# Patient Record
Sex: Male | Born: 1940 | Race: White | Hispanic: No | Marital: Married | Smoking: Former smoker
Health system: Southern US, Community
[De-identification: ages and names within clinical notes are randomized; demographics above are authoritative.]

## PROBLEM LIST (undated history)

## (undated) DIAGNOSIS — K219 Gastro-esophageal reflux disease without esophagitis: Secondary | ICD-10-CM

## (undated) DIAGNOSIS — N200 Calculus of kidney: Secondary | ICD-10-CM

## (undated) DIAGNOSIS — I10 Essential (primary) hypertension: Secondary | ICD-10-CM

## (undated) DIAGNOSIS — Q159 Congenital malformation of eye, unspecified: Secondary | ICD-10-CM

## (undated) DIAGNOSIS — C140 Malignant neoplasm of pharynx, unspecified: Secondary | ICD-10-CM

## (undated) DIAGNOSIS — C61 Malignant neoplasm of prostate: Secondary | ICD-10-CM

## (undated) HISTORY — DX: Malignant neoplasm of prostate: C61

## (undated) HISTORY — PX: CAROTID STENT: SHX1301

## (undated) HISTORY — PX: TONSILLECTOMY: SUR1361

## (undated) HISTORY — PX: EYE SURGERY: SHX253

---

## 2011-12-03 ENCOUNTER — Encounter (HOSPITAL_BASED_OUTPATIENT_CLINIC_OR_DEPARTMENT_OTHER): Payer: Self-pay | Admitting: *Deleted

## 2011-12-03 ENCOUNTER — Emergency Department (HOSPITAL_BASED_OUTPATIENT_CLINIC_OR_DEPARTMENT_OTHER)
Admission: EM | Admit: 2011-12-03 | Discharge: 2011-12-03 | Disposition: A | Discharge status: Home / Self Care | Payer: Medicare Other | Attending: Emergency Medicine | Admitting: Emergency Medicine

## 2011-12-03 DIAGNOSIS — R109 Unspecified abdominal pain: Secondary | ICD-10-CM | POA: Insufficient documentation

## 2011-12-03 DIAGNOSIS — R1110 Vomiting, unspecified: Secondary | ICD-10-CM

## 2011-12-03 DIAGNOSIS — E876 Hypokalemia: Secondary | ICD-10-CM | POA: Insufficient documentation

## 2011-12-03 DIAGNOSIS — R5381 Other malaise: Secondary | ICD-10-CM | POA: Insufficient documentation

## 2011-12-03 DIAGNOSIS — R10817 Generalized abdominal tenderness: Secondary | ICD-10-CM | POA: Insufficient documentation

## 2011-12-03 HISTORY — DX: Essential (primary) hypertension: I10

## 2011-12-03 HISTORY — DX: Malignant neoplasm of pharynx, unspecified: C14.0

## 2011-12-03 LAB — COMPREHENSIVE METABOLIC PANEL
Albumin: 3.1 g/dL — ABNORMAL LOW (ref 3.5–5.2)
Alkaline Phosphatase: 80 U/L (ref 39–117)
BUN: 8 mg/dL (ref 6–23)
Calcium: 9.1 mg/dL (ref 8.4–10.5)
GFR calc Af Amer: 87 mL/min — ABNORMAL LOW (ref >90)
Glucose, Bld: 115 mg/dL — ABNORMAL HIGH (ref 70–99)
Potassium: 2.9 mEq/L — ABNORMAL LOW (ref 3.5–5.1)
Total Protein: 6 g/dL (ref 6.0–8.3)

## 2011-12-03 LAB — URINE MICROSCOPIC-ADD ON

## 2011-12-03 LAB — DIFFERENTIAL
Basophils Absolute: 0 10*3/uL (ref 0.0–0.1)
Eosinophils Absolute: 0 10*3/uL (ref 0.0–0.7)
Lymphocytes Relative: 52 % — ABNORMAL HIGH (ref 12–46)
Lymphs Abs: 0.9 10*3/uL (ref 0.7–4.0)
Monocytes Relative: 12 % (ref 3–12)

## 2011-12-03 LAB — CBC
MCHC: 35.7 g/dL (ref 30.0–36.0)
Platelets: 85 10*3/uL — ABNORMAL LOW (ref 150–400)
RDW: 14.2 % (ref 11.5–15.5)
WBC: 1.6 10*3/uL — ABNORMAL LOW (ref 4.0–10.5)

## 2011-12-03 LAB — URINALYSIS, ROUTINE W REFLEX MICROSCOPIC
Hgb urine dipstick: NEGATIVE
Nitrite: NEGATIVE
Protein, ur: 30 mg/dL — AB
Specific Gravity, Urine: 1.019 (ref 1.005–1.030)
Urobilinogen, UA: 1 mg/dL (ref 0.0–1.0)

## 2011-12-03 LAB — LIPASE, BLOOD: Lipase: 34 U/L (ref 11–59)

## 2011-12-03 MED ORDER — ONDANSETRON HCL 4 MG/2ML IJ SOLN
4.0000 mg | Freq: Once | INTRAMUSCULAR | Status: AC
  Administered 2011-12-03: 4 mg via INTRAVENOUS
  Filled 2011-12-03: qty 2

## 2011-12-03 MED ORDER — POTASSIUM CHLORIDE 10 MEQ/100ML IV SOLN
10.0000 meq | Freq: Once | INTRAVENOUS | Status: AC
  Administered 2011-12-03: 10 meq via INTRAVENOUS
  Filled 2011-12-03: qty 100

## 2011-12-03 MED ORDER — FAMOTIDINE IN NACL 20-0.9 MG/50ML-% IV SOLN
20.0000 mg | Freq: Once | INTRAVENOUS | Status: AC
  Administered 2011-12-03: 20 mg via INTRAVENOUS
  Filled 2011-12-03: qty 50

## 2011-12-03 MED ORDER — POTASSIUM CHLORIDE ER 10 MEQ PO TBCR: 20.0000 meq | EXTENDED_RELEASE_TABLET | Freq: Every day | ORAL | Status: DC

## 2011-12-03 MED ORDER — SODIUM CHLORIDE 0.9 % IV BOLUS (SEPSIS)
2000.0000 mL | Freq: Once | INTRAVENOUS | Status: AC
  Administered 2011-12-03: 2000 mL via INTRAVENOUS

## 2011-12-03 NOTE — ED Provider Notes (Signed)
History     CSN: [id number]  Arrival date & time 12/03/11  0845   First MD Initiated Contact with Patient 12/03/11 [phone]      Chief Complaint  Patient presents with  . Abdominal Pain  . Emesis    (Consider location/radiation/quality/duration/timing/severity/associated sxs/prior treatment) Patient is a 71 y.o. male presenting with abdominal pain and vomiting. The history is provided by the patient and the spouse.  Abdominal Pain The primary symptoms of the illness include abdominal pain, fatigue, nausea and vomiting. The primary symptoms of the illness do not include fever, shortness of breath, diarrhea or dysuria. The current episode started more than 2 days ago. The onset of the illness was gradual. The problem has not changed since onset. The illness is associated with eating. The patient has not had a change in bowel habit. Additional symptoms associated with the illness include anorexia and diaphoresis. Symptoms associated with the illness do not include chills, heartburn, constipation, urgency, hematuria, frequency or back pain. Significant associated medical issues do not include PUD, GERD, inflammatory bowel disease, diabetes, sickle cell disease, gallstones, liver disease, substance abuse, diverticulitis or HIV.  Emesis  Associated symptoms include abdominal pain. Pertinent negatives include no chills, no cough, no diarrhea, no fever and no headaches.   71 year old male coming in today with complaint of dehydration. States that he has had 35 radiation treatments that ended last Tuesday and ischemic treatments and at the third week in April for soft acute cancer. States that he vomits on a daily basis vomited 3 times yesterday. Also states he has general abdominal pain that he has had throughout the treatments no new pain. He has a PCP Gen. Margo Aye that he went to Saturday and received 1 L. States that he doesn't think he received enough fluid that he still feels dehydrated. He goes to the  oncologist as to Dr. Marquis Lunch. States that he has not eaten in several days because of his nausea and discomfort. States that anytime he takes his medication or takes his pills he vomits it back up. Past medical history of hypertension. She never smokes.  Past Medical History  Diagnosis Date  . Throat cancer     No past surgical history on file.  No family history on file.  History  Substance Use Topics  . Smoking status: Not on file  . Smokeless tobacco: Not on file  . Alcohol Use:       Review of Systems  Constitutional: Positive for diaphoresis and fatigue. Negative for fever and chills.  HENT: Negative.  Negative for congestion, sore throat, rhinorrhea, mouth sores, trouble swallowing, neck pain and voice change.   Eyes: Negative.   Respiratory: Negative.  Negative for cough and shortness of breath.   Cardiovascular: Negative.  Negative for chest pain.  Gastrointestinal: Positive for nausea, vomiting, abdominal pain, blood in stool and anorexia. Negative for heartburn, diarrhea, constipation and rectal pain.  Genitourinary: Negative for dysuria, urgency, frequency, hematuria and difficulty urinating.  Musculoskeletal: Negative for back pain and gait problem.  Skin: Negative.   Neurological: Negative.  Negative for dizziness, tremors, syncope, numbness and headaches.  Psychiatric/Behavioral: Negative.   All other systems reviewed and are negative.    Allergies  Review of patient's allergies indicates no known allergies.  Home Medications   Current Outpatient Rx  Name Route Sig Dispense Refill  . AMLODIPINE BESYLATE 10 MG PO TABS Oral Take 10 mg by mouth daily.    . ASPIRIN 81 MG PO TABS Oral Take 81 mg  by mouth daily.    Marland Kitchen DEXAMETHASONE 4 MG PO TABS Oral Take 4 mg by mouth 2 (two) times daily with a meal.    . FAMOTIDINE 20 MG PO TABS Oral Take 20 mg by mouth 2 (two) times daily.    Marland Kitchen FLUCONAZOLE 100 MG PO TABS Oral Take 100 mg by mouth daily.    Marland Kitchen FLUTICASONE PROPIONATE  50 MCG/ACT NA SUSP Nasal Place 2 sprays into the nose daily as needed.    Marland Kitchen LORAZEPAM 1 MG PO TABS Oral Take 1 mg by mouth every 6 (six) hours as needed.    Marland Kitchen LOSARTAN POTASSIUM 100 MG PO TABS Oral Take 100 mg by mouth daily.    Marland Kitchen MIRTAZAPINE 15 MG PO TABS Oral Take 15 mg by mouth at bedtime.    . OMEPRAZOLE 40 MG PO CPDR Oral Take 40 mg by mouth daily.    Marland Kitchen ONDANSETRON 8 MG PO TBDP Oral Take 8 mg by mouth every 8 (eight) hours as needed.    Marland Kitchen POLYETHYLENE GLYCOL 3350 PO PACK Oral Take 17 g by mouth daily.    Marland Kitchen PROCHLORPERAZINE MALEATE 10 MG PO TABS Oral Take 10 mg by mouth every 6 (six) hours as needed.    . SENNOSIDES 8.8 MG/5ML PO SYRP Oral Take by mouth at bedtime.      BP 133/85  Pulse 86  Temp(Src) 97.9 F (36.6 C) (Oral)  Resp 20  Ht 6\' 4"  (1.93 m)  Wt 220 lb (99.791 kg)  BMI 26.78 kg/m2  SpO2 99%  Physical Exam  Nursing note and vitals reviewed. Constitutional: He is oriented to person, place, and time. He appears well-developed and well-nourished.  HENT:  Head: Normocephalic.  Eyes: Conjunctivae and EOM are normal. Pupils are equal, round, and reactive to light.  Neck: Normal range of motion. Neck supple.  Cardiovascular: Normal rate.   Pulmonary/Chest: Effort normal and breath sounds normal. No respiratory distress.  Abdominal: Soft. Bowel sounds are normal. He exhibits no distension. There is tenderness.       Generalized tenderness with palpatation  Musculoskeletal: Normal range of motion. He exhibits no edema and no tenderness.  Neurological: He is alert and oriented to person, place, and time.  Skin: Skin is warm and dry. He is not diaphoretic.  Psychiatric: He has a normal mood and affect.    ED Course  Procedures (including critical care time)  Labs Reviewed - No data to display No results found.   No diagnosis found.    MDM  No further vomiting or diarrhea in the ER today.  Tolerating po's.  Will follow up with pcp in [place].  Labs  unremarkable.  rx for zofran .  Continue prilosec at home.  Urine culture pending .  No symptoms of uti.  Labs Reviewed  CBC - Abnormal; Notable for the following:    WBC 1.6 (*) WHITE COUNT CONFIRMED ON SMEAR   RBC 4.13 (*)    Hemoglobin 12.6 (*)    HCT 35.3 (*)    Platelets 85 (*)    All other components within normal limits  DIFFERENTIAL - Abnormal; Notable for the following:    Neutrophils Relative 34 (*)    Lymphocytes Relative 52 (*)    Neutro Abs 0.5 (*)    All other components within normal limits  URINALYSIS, ROUTINE W REFLEX MICROSCOPIC - Abnormal; Notable for the following:    Color, Urine AMBER (*) BIOCHEMICALS MAY BE AFFECTED BY COLOR   Bilirubin Urine SMALL (*)  Ketones, ur 15 (*)    Protein, ur 30 (*)    All other components within normal limits  COMPREHENSIVE METABOLIC PANEL - Abnormal; Notable for the following:    Potassium 2.9 (*)    Glucose, Bld 115 (*)    Albumin 3.1 (*)    GFR calc non Af Amer 75 (*)    GFR calc Af Amer 87 (*)    All other components within normal limits  URINE MICROSCOPIC-ADD ON - Abnormal; Notable for the following:    Bacteria, UA MANY (*)    Casts HYALINE CASTS (*) GRANULAR CAST   All other components within normal limits  LIPASE, BLOOD  URINE CULTURE          Remi Haggard, NP 12/03/11 1945

## 2011-12-03 NOTE — Discharge Instructions (Signed)
Mr Bumbaugh we gave you 2 L of normal saline in the ER today. We also gave you some Zofran in your IV. Your  potassium was 2.9 and we replaced 10 mEq in your IV. We will give you a prescription for 2 days to replenish your potassium. Take this medication after you had eaten. Bananas and green repeat vegetables have potassium in them try to keep this in your diet in the next couple days. Follow instructions for the Bratt diet and use her eating again. Drink plenty of fluids.  There was many bacteria in your urine today. We will culture the urine and if you need antibiotics you  will be called. [facility] PCP recheck your urine and potassium this week.

## 2011-12-03 NOTE — ED Notes (Signed)
Pt amb to room 10 with quick steady gait in nad. Pt reports decreased appetite x 2 days, with his usual baseline vomiting every day, no change since starting chemo months ago. Pt states he receives chemo and radiation at [facility] at bedside for eval, pt denies any cp or sob or any other c/o.

## 2011-12-04 LAB — URINE CULTURE
Colony Count: NO GROWTH
Culture: NO GROWTH

## 2011-12-04 NOTE — ED Provider Notes (Signed)
Medical screening examination/treatment/procedure(s) were conducted as a shared visit with non-physician practitioner(s) and myself.  I personally evaluated the patient during the encounter Patient with a history of throat cancer who finished chemotherapy and radiation last week. He continues to have nausea that is unchanged from his baseline nausea but he states he feels dehydrated. On exam he is mildly neutropenic but denies any fever and is afebrile here. Mild hypokalemia. After potassium replacement and IV fluids patient is feeling much better and was discharged  Gwyneth Sprout, MD 12/04/11 1520

## 2011-12-07 ENCOUNTER — Emergency Department (HOSPITAL_BASED_OUTPATIENT_CLINIC_OR_DEPARTMENT_OTHER)
Admission: EM | Admit: 2011-12-07 | Discharge: 2011-12-07 | Disposition: A | Discharge status: Home / Self Care | Payer: Medicare Other | Attending: Emergency Medicine | Admitting: Emergency Medicine

## 2011-12-07 ENCOUNTER — Encounter (HOSPITAL_BASED_OUTPATIENT_CLINIC_OR_DEPARTMENT_OTHER): Payer: Self-pay | Admitting: *Deleted

## 2011-12-07 DIAGNOSIS — Z85819 Personal history of malignant neoplasm of unspecified site of lip, oral cavity, and pharynx: Secondary | ICD-10-CM | POA: Insufficient documentation

## 2011-12-07 DIAGNOSIS — I10 Essential (primary) hypertension: Secondary | ICD-10-CM | POA: Insufficient documentation

## 2011-12-07 DIAGNOSIS — R350 Frequency of micturition: Secondary | ICD-10-CM

## 2011-12-07 DIAGNOSIS — R300 Dysuria: Secondary | ICD-10-CM | POA: Insufficient documentation

## 2011-12-07 DIAGNOSIS — R3915 Urgency of urination: Secondary | ICD-10-CM | POA: Insufficient documentation

## 2011-12-07 DIAGNOSIS — R6883 Chills (without fever): Secondary | ICD-10-CM | POA: Insufficient documentation

## 2011-12-07 DIAGNOSIS — Z79899 Other long term (current) drug therapy: Secondary | ICD-10-CM | POA: Insufficient documentation

## 2011-12-07 LAB — URINALYSIS, ROUTINE W REFLEX MICROSCOPIC
Bilirubin Urine: NEGATIVE
Glucose, UA: NEGATIVE mg/dL
Hgb urine dipstick: NEGATIVE
Ketones, ur: NEGATIVE mg/dL
Leukocytes, UA: NEGATIVE
Nitrite: NEGATIVE
Protein, ur: NEGATIVE mg/dL
Specific Gravity, Urine: 1.01 (ref 1.005–1.030)
Urobilinogen, UA: 0.2 mg/dL (ref 0.0–1.0)
pH: 8 (ref 5.0–8.0)

## 2011-12-07 NOTE — ED Notes (Signed)
Pt assisted to bathroom , pt ambulated with supervison only

## 2011-12-07 NOTE — Discharge Instructions (Signed)
Urinary Frequency The number of times a normal person urinates depends upon how much liquid they take in and how much liquid they are losing. If the temperature is hot and there is high humidity then the person will sweat more and usually breathe a little more frequently. These factors decrease the amount of frequency of urination that would be considered normal. The amount you drink is easily determined, but the amount of fluid lost is sometimes more difficult to calculate.  Fluid is lost in two ways:  Sensible fluid loss is usually measured by the amount of urine that you get rid of. Losses of fluid can also occur with diarrhea.   Insensible fluid loss is more difficult to measure. It is caused by evaporation. Insensible loss of fluid occurs through breathing and sweating. It usually ranges from a little less than a quart to a little more than a quart of fluid a day.  In normal temperatures and activity levels the average person may urinate 4 to 7 times in a 24-hour period. Needing to urinate more often than that could indicate a problem. If one urinates 4 to 7 times in 24 hours and has large volumes each time, that could indicate a different problem from one who urinates 4 to 7 times a day and has small volumes. The time of urinating is also an important. Most urinating should be done during the waking hours. Getting up at night to urinate frequently can indicate some problems. CAUSES  The bladder is the organ in your lower abdomen that holds urine. Like a balloon, it swells some as it fills up. Your nerves sense this and tell you it is time to head for the bathroom. There are a number of reasons that you might feel the need to urinate more often than usual. They include:  Urinary tract infection. This is usually associated with other signs such as burning when you urinate.   In men, problems with the prostate (a walnut-size gland that is located near the tube that carries urine out of your body).  There are two reasons why the prostate can cause an increased frequency of urination:   An enlarged prostate that does not let the bladder empty well. If the bladder only half empties when you urinate then it only has half the capacity to fill before you have to urinate again.   The nerves in the bladder become more hypersensitive with an increased size of the prostate even if the bladder empties completely.   Pregnancy.   Obesity. Excess weight is more likely to cause a problem for women more than for men.   Bladder stones or other bladder problems.   Caffeine.   Alcohol.   Medications. For example, drugs that help the body get rid of extra fluid (diuretics) increase urine production. Some other medicines must be taken with lots of fluids.   Muscle or nerve weakness. This might be the result of a spinal cord injury, a stroke, multiple sclerosis or Parkinson's disease.   Long-standing diabetes can decrease the sensation of the bladder. This loss of sensation makes it harder to sense the bladder needs to be emptied. Over a period of years the bladder is stretched out by constant overfilling. This weakens the bladder muscles so that the bladder does not empty well and has less capacity to fill with new urine.   Interstitial cystitis (also called painful bladder syndrome). This condition develops because the tissues that line the insider of the bladder are inflamed (  inflammation is the body's way of reacting to injury or infection). It causes pain and frequent urination. It occurs in women more often than in men.  DIAGNOSIS   To decide what might be causing your urinary frequency, your healthcare provider will probably:   Ask about symptoms you have noticed.   Ask about your overall health. This will include questions about any medications you are taking.   Do a physical examination.   Order some tests. These might include:   A blood test to check for diabetes or other health issues  that could be contributing to the problem.   Urine testing. This could measure the flow of urine and the pressure on the bladder.   A test of your neurological system (the brain, spinal cord and nerves). This is the system that senses the need to urinate.   A bladder test to check whether it is emptying completely when you urinate.   Cytoscopy. This test uses a thin tube with a tiny camera on it. It offers a look inside your urethra and bladder to see if there are problems.   Imaging tests. You might be given a contrast dye and then asked to urinate. X-rays are taken to see how your bladder is working.  TREATMENT  It is important for you to be evaluated to determine if the amount or frequency that you have is unusual or abnormal. If it is found to be abnormal the cause should be determined and this can usually be found out easily. Depending upon the cause treatment could include medication, stimulation of the nerves, or surgery. There are not too many things that you can do as an individual to change your urinary frequency. It is important that you balance the amount of fluid intake needed to compensate for your activity and the temperature. Medical problems will be diagnosed and taken care of by your physician. There is no particular bladder training such as Kegel's exercises that you can do to help urinary frequency. This is an exercise this is usually done for people who have leaking of urine when they laugh cough or sneeze. HOME CARE INSTRUCTIONS   Take any medications your healthcare provider prescribed or suggested. Follow the directions carefully.   Practice any lifestyle changes that are recommended. These might include:   Drinking less fluid or drinking at different times of the day. If you need to urinate often during the night, for example, you may need to stop drinking fluids early in the evening.   Cutting down on caffeine or alcohol. They both can make you need to urinate more  often than normal. Caffeine is found in coffee, tea and sodas.   Losing weight, if that is recommended.   Keep a journal or a log. You might be asked to record how much you drink and when and when you feel the need to urinate. This will also help evaluate how well the treatment provided by your physician is working.  SEEK MEDICAL CARE IF:   Your need to urinate often gets worse.   You feel increased pain or irritation when you urinate.   You notice blood in your urine.   You have questions about any medications that your healthcare provider recommended.   You notice blood, pus or swelling at the site of any test or treatment procedure.   You develop a fever of more than 100.5 F (38.1 C).  SEEK IMMEDIATE MEDICAL CARE IF:  You develop a fever of more than 102.0   F (38.9 C). Document Released: 05/19/2009 Document Revised: 07/12/2011 Document Reviewed: 05/19/2009 [employer] Patient Information 2012 [employer]. 

## 2011-12-07 NOTE — ED Notes (Signed)
Pt reports urinary urgency and frequency since this afternoon with chills. Pt reports his last blood tests showed neutropenia, and was told to come to ER immediately for any signs of infection. Pt finished his last chemo treatment 2 weeks ago for throat cancer.

## 2011-12-07 NOTE — ED Notes (Signed)
Pt reports just completed chemo 2 weeks ago and radiation 1 week ago for throat cancer, denies difficulty voiding prior to this afternoon. Reports urgency, hesitation and difficulty urinating, pt states their is a burning sensation when urinating, denies any previous uti's or issues with urination.

## 2011-12-08 ENCOUNTER — Encounter (HOSPITAL_BASED_OUTPATIENT_CLINIC_OR_DEPARTMENT_OTHER): Payer: Self-pay | Admitting: Emergency Medicine

## 2011-12-08 NOTE — ED Provider Notes (Signed)
History     CSN: [id number]  Arrival date & time 12/07/11  2001   First MD Initiated Contact with Patient 12/07/11 2024      Chief Complaint  Patient presents with  . Urinary Frequency    (Consider location/radiation/quality/duration/timing/severity/associated sxs/prior treatment) HPI Comments: Pt had some chills today associated with urinary freq and mild dysuria and thus came to be evaluated.  He is a cancer pt with known neutropenia, seen by his oncologist yesterday.  He was seen earlier in the week in the ED for dehydration and received IVF's.  A UA was checked and due to some question for possible UTI, was told by oncologist to come back to the ED immediately if any signs or symptoms of infection occurred.  Pt denies fever, no N/V/D.  Denies any prior h/o prostate problems, is checked by his PCP yearly.  No back pain.  No hematuria.  Has h/o kidney stones in the past, but no flank pain.    The history is provided by the patient and the spouse.    Past Medical History  Diagnosis Date  . Throat cancer   . Hypertension     History reviewed. No pertinent past surgical history.  No family history on file.  History  Substance Use Topics  . Smoking status: Former Games developer  . Smokeless tobacco: Not on file  . Alcohol Use: No      Review of Systems  Constitutional: Positive for chills. Negative for fever, activity change and appetite change.  HENT: Negative for congestion, rhinorrhea and sinus pressure.   Respiratory: Negative for cough.   Gastrointestinal: Negative for nausea, vomiting and abdominal pain.  Genitourinary: Positive for dysuria, urgency and frequency. Negative for hematuria, flank pain, decreased urine volume, discharge, scrotal swelling, penile pain and testicular pain.  Musculoskeletal: Negative for back pain.  Skin: Negative for rash.  Neurological: Negative for headaches.    Allergies  Review of patient's allergies indicates no known allergies.  Home  Medications   Current Outpatient Rx  Name Route Sig Dispense Refill  . AMLODIPINE BESYLATE 10 MG PO TABS Oral Take 10 mg by mouth daily.    Marland Kitchen BIMATOPROST 0.03 % OP SOLN Left Eye Place 1 drop into the left eye 2 (two) times daily.    Marland Kitchen DEXAMETHASONE 4 MG PO TABS Oral Take 4 mg by mouth 2 (two) times daily with a meal. Takes week of chemo then 3 days following    . FAMOTIDINE 20 MG PO TABS Oral Take 20 mg by mouth 2 (two) times daily.    Marland Kitchen LORAZEPAM 1 MG PO TABS Oral Take 1 mg by mouth every 6 (six) hours as needed.    Marland Kitchen LOSARTAN POTASSIUM 100 MG PO TABS Oral Take 100 mg by mouth daily.    Marland Kitchen ONDANSETRON 8 MG PO TBDP Oral Take 8 mg by mouth every 8 (eight) hours as needed.    Marland Kitchen POTASSIUM CHLORIDE ER 10 MEQ PO TBCR Oral Take 2 tablets (20 mEq total) by mouth daily. 2 tablet 0    Take 20 meq daily x 3 starting today  . FLUTICASONE PROPIONATE 50 MCG/ACT NA SUSP Nasal Place 2 sprays into the nose daily as needed.    Marland Kitchen MAGNESIUM HYDROXIDE 400 MG/5ML PO SUSP Oral Take 30 mLs by mouth daily as needed. For constipation      BP 149/84  Pulse 94  Temp(Src) 98.8 F (37.1 C) (Oral)  Resp 20  Ht 6\' 4"  (1.93 m)  Wt 225  lb (102.059 kg)  BMI 27.39 kg/m2  SpO2 98%  Physical Exam  Nursing note and vitals reviewed. Constitutional: He is oriented to person, place, and time. He appears well-developed and well-nourished. No distress.  Pulmonary/Chest: Effort normal. No respiratory distress. He has no wheezes.  Abdominal: Soft. He exhibits no distension. There is no tenderness. There is no rebound, no guarding and no CVA tenderness.  Neurological: He is alert and oriented to person, place, and time.  Skin: Skin is warm. No rash noted. He is not diaphoretic.    ED Course  Procedures (including critical care time)   Labs Reviewed  URINALYSIS, ROUTINE W REFLEX MICROSCOPIC  URINE CULTURE   No results found.   1. Urinary frequency       MDM  Bedside U/s performed by me, however image not  archived, suggestive of bladder distention.  Pt had just urinated and felt like he did not need to empty further.  I suggested in and out cath or foley.  Pt was mostly concerned about possibly infection, but UA here is normal.  Culture is again added, and I reviewed culture from earlier this week and no growth shown.  Pt and spouse are reassured, know to return if worse, will follow up with his own urologist next week.          Gavin Pound. Oletta Lamas, MD 12/08/11 [phone]

## 2011-12-09 LAB — URINE CULTURE
Colony Count: 3000
Culture  Setup Time: 201305040213

## 2012-01-06 ENCOUNTER — Encounter (HOSPITAL_BASED_OUTPATIENT_CLINIC_OR_DEPARTMENT_OTHER): Payer: Self-pay | Admitting: Emergency Medicine

## 2012-01-06 ENCOUNTER — Emergency Department (HOSPITAL_BASED_OUTPATIENT_CLINIC_OR_DEPARTMENT_OTHER): Payer: Medicare Other

## 2012-01-06 ENCOUNTER — Emergency Department (HOSPITAL_BASED_OUTPATIENT_CLINIC_OR_DEPARTMENT_OTHER)
Admission: EM | Admit: 2012-01-06 | Discharge: 2012-01-06 | Disposition: A | Discharge status: Home / Self Care | Payer: Medicare Other | Attending: Emergency Medicine | Admitting: Emergency Medicine

## 2012-01-06 DIAGNOSIS — R109 Unspecified abdominal pain: Secondary | ICD-10-CM | POA: Insufficient documentation

## 2012-01-06 DIAGNOSIS — I10 Essential (primary) hypertension: Secondary | ICD-10-CM | POA: Insufficient documentation

## 2012-01-06 DIAGNOSIS — K219 Gastro-esophageal reflux disease without esophagitis: Secondary | ICD-10-CM | POA: Insufficient documentation

## 2012-01-06 DIAGNOSIS — R1110 Vomiting, unspecified: Secondary | ICD-10-CM | POA: Insufficient documentation

## 2012-01-06 DIAGNOSIS — Z79899 Other long term (current) drug therapy: Secondary | ICD-10-CM | POA: Insufficient documentation

## 2012-01-06 DIAGNOSIS — R112 Nausea with vomiting, unspecified: Secondary | ICD-10-CM | POA: Insufficient documentation

## 2012-01-06 HISTORY — DX: Gastro-esophageal reflux disease without esophagitis: K21.9

## 2012-01-06 HISTORY — DX: Calculus of kidney: N20.0

## 2012-01-06 HISTORY — DX: Congenital malformation of eye, unspecified: Q15.9

## 2012-01-06 LAB — CBC
Hemoglobin: 13.2 g/dL (ref 13.0–17.0)
MCV: 85.7 fL (ref 78.0–100.0)
Platelets: 205 10*3/uL (ref 150–400)
RBC: 4.26 MIL/uL (ref 4.22–5.81)
WBC: 4 10*3/uL (ref 4.0–10.5)

## 2012-01-06 LAB — HEPATIC FUNCTION PANEL
ALT: 9 U/L (ref 0–53)
AST: 21 U/L (ref 0–37)
Bilirubin, Direct: 0.1 mg/dL (ref 0.0–0.3)
Total Bilirubin: 0.5 mg/dL (ref 0.3–1.2)

## 2012-01-06 LAB — BASIC METABOLIC PANEL
CO2: 29 mEq/L (ref 19–32)
Glucose, Bld: 110 mg/dL — ABNORMAL HIGH (ref 70–99)
Potassium: 3 mEq/L — ABNORMAL LOW (ref 3.5–5.1)
Sodium: 137 mEq/L (ref 135–145)

## 2012-01-06 LAB — URINALYSIS, ROUTINE W REFLEX MICROSCOPIC
Hgb urine dipstick: NEGATIVE
Nitrite: NEGATIVE
Specific Gravity, Urine: 1.012 (ref 1.005–1.030)
Urobilinogen, UA: 1 mg/dL (ref 0.0–1.0)
pH: 7 (ref 5.0–8.0)

## 2012-01-06 LAB — DIFFERENTIAL
Lymphocytes Relative: 32 % (ref 12–46)
Lymphs Abs: 1.3 10*3/uL (ref 0.7–4.0)
Monocytes Relative: 11 % (ref 3–12)
Neutrophils Relative %: 55 % (ref 43–77)

## 2012-01-06 MED ORDER — SODIUM CHLORIDE 0.9 % IV SOLN: INTRAVENOUS | Status: DC

## 2012-01-06 MED ORDER — SODIUM CHLORIDE 0.9 % IV BOLUS (SEPSIS)
1000.0000 mL | Freq: Once | INTRAVENOUS | Status: AC
  Administered 2012-01-06: 1000 mL via INTRAVENOUS

## 2012-01-06 MED ORDER — SODIUM CHLORIDE 0.9 % IV SOLN
Freq: Once | INTRAVENOUS | Status: AC
  Administered 2012-01-06: 10:00:00 via INTRAVENOUS

## 2012-01-06 MED ORDER — METOCLOPRAMIDE HCL 5 MG/ML IJ SOLN
10.0000 mg | Freq: Once | INTRAMUSCULAR | Status: AC
  Administered 2012-01-06: 10 mg via INTRAVENOUS
  Filled 2012-01-06: qty 2

## 2012-01-06 MED ORDER — POTASSIUM CHLORIDE CRYS ER 20 MEQ PO TBCR
40.0000 meq | EXTENDED_RELEASE_TABLET | Freq: Once | ORAL | Status: AC
  Administered 2012-01-06: 40 meq via ORAL
  Filled 2012-01-06: qty 2

## 2012-01-06 MED ORDER — HYDROMORPHONE HCL PF 1 MG/ML IJ SOLN
1.0000 mg | Freq: Once | INTRAMUSCULAR | Status: AC
  Administered 2012-01-06: 1 mg via INTRAVENOUS
  Filled 2012-01-06: qty 1

## 2012-01-06 NOTE — ED Provider Notes (Signed)
History     CSN: [id number]  Arrival date & time 01/06/12  2841   First MD Initiated Contact with Patient 01/06/12 [phone]      Chief Complaint  Patient presents with  . Anorexia  . Nausea  . Fatigue    (Consider location/radiation/quality/duration/timing/severity/associated sxs/prior treatment) HPI Several days gradual onset diffuse abdominal pain more so in the lower abdomen than the upper abdomen with nausea and occasional nonbloody vomiting with no diarrhea or bloody stools. He did have constipation several weeks ago that improved this week with stool softeners from his Dr. with Eather Colas and [facility] and is having good bowel movements over the last few days. He is no fever confusion rash chest pain cough or shortness of breath. He has no dysuria or urinary frequency but has decreased urination from decreased appetite decreased oral intake and feels dehydrated. He does not have lateralizing or focal abdominal pain. There is no treatment prior to arrival. He has no testicular pain. Past Medical History  Diagnosis Date  . Throat cancer   . Hypertension   . Kidney stones   . GERD (gastroesophageal reflux disease)   . Eye abnormalities     Past Surgical History  Procedure Date  . Eye surgery     History reviewed. No pertinent family history.  History  Substance Use Topics  . Smoking status: Former Games developer  . Smokeless tobacco: Not on file  . Alcohol Use: No      Review of Systems  Constitutional: Positive for appetite change. Negative for fever.       10 Systems reviewed and are negative for acute change except as noted in the HPI.  HENT: Negative for congestion.   Eyes: Negative for discharge and redness.  Respiratory: Negative for cough and shortness of breath.   Cardiovascular: Negative for chest pain.  Gastrointestinal: Positive for nausea, vomiting and abdominal pain. Negative for diarrhea and blood in stool.  Genitourinary: Negative for dysuria, scrotal swelling and  testicular pain.  Musculoskeletal: Negative for back pain.  Skin: Negative for rash.  Neurological: Negative for syncope, numbness and headaches.  Psychiatric/Behavioral:       No behavior change.    Allergies  Review of patient's allergies indicates no known allergies.  Home Medications   Current Outpatient Rx  Name Route Sig Dispense Refill  . AMLODIPINE BESYLATE 10 MG PO TABS Oral Take 10 mg by mouth daily.    Marland Kitchen BIMATOPROST 0.03 % OP SOLN Left Eye Place 1 drop into the left eye 2 (two) times daily.    Marland Kitchen DEXAMETHASONE 4 MG PO TABS Oral Take 4 mg by mouth 2 (two) times daily with a meal. Takes week of chemo then 3 days following    . DOCUSATE SODIUM 100 MG PO CAPS Oral Take 100 mg by mouth 2 (two) times daily as needed.    Marland Kitchen FAMOTIDINE 20 MG PO TABS Oral Take 20 mg by mouth 2 (two) times daily.    Marland Kitchen FLUTICASONE PROPIONATE 50 MCG/ACT NA SUSP Nasal Place 2 sprays into the nose daily as needed.    Marland Kitchen LOSARTAN POTASSIUM 100 MG PO TABS Oral Take 100 mg by mouth daily.    Marland Kitchen POLYETHYLENE GLYCOL 3350 PO PACK Oral Take 17 g by mouth daily as needed.      BP 133/69  Pulse 60  Temp(Src) 98.4 F (36.9 C) (Oral)  Resp 18  Ht 6\' 2"  (1.88 m)  Wt 213 lb (96.616 kg)  BMI 27.35 kg/m2  SpO2 97%  Physical Exam  Nursing note and vitals reviewed. Constitutional:       Awake, alert, nontoxic appearance.  HENT:  Head: Atraumatic.  Eyes: Right eye exhibits no discharge. Left eye exhibits no discharge.  Neck: Neck supple.  Cardiovascular: Normal rate and regular rhythm.   No murmur heard. Pulmonary/Chest: Effort normal and breath sounds normal. No respiratory distress. He has no wheezes. He has no rales. He exhibits no tenderness.  Abdominal: Soft. Bowel sounds are normal. He exhibits no mass. There is no tenderness. There is no rebound and no guarding.  Genitourinary:       Testes nontender and no palpable inguinal hernias  Musculoskeletal: He exhibits no tenderness.       Baseline ROM, no  obvious new focal weakness.  Neurological:       Mental status and motor strength appears baseline for patient and situation.  Skin: No rash noted.  Psychiatric: He has a normal mood and affect.    ED Course  Procedures (including critical care time) No pain and abd SNT at recheck feels better after IVF has Compazine, Zofran, and Ativan at home for nausea prn.[id number] Labs Reviewed  URINALYSIS, ROUTINE W REFLEX MICROSCOPIC - Abnormal; Notable for the following:    APPearance CLOUDY (*)    All other components within normal limits  CBC - Abnormal; Notable for the following:    HCT 36.5 (*)    MCHC 36.2 (*)    All other components within normal limits  BASIC METABOLIC PANEL - Abnormal; Notable for the following:    Potassium 3.0 (*)    Glucose, Bld 110 (*)    GFR calc non Af Amer 67 (*)    GFR calc Af Amer 78 (*)    All other components within normal limits  DIFFERENTIAL  HEPATIC FUNCTION PANEL  LIPASE, BLOOD  TROPONIN I   Dg Abd Acute W/chest  01/06/2012  *RADIOLOGY REPORT*  Clinical Data: Pain, nausea, vomiting, diarrhea  ACUTE ABDOMEN SERIES (ABDOMEN 2 VIEW & CHEST 1 VIEW)  Comparison: None.  Findings: Normal heart size and vascularity.  No focal pneumonia, collapse, consolidation, effusion or pneumothorax.  Trachea midline.  Scattered air and stool throughout the bowel.  Negative for dilatation or obstruction.  No free air.  Diffuse degenerative changes of the lower lumbar spine.  Small calcification in the right mid abdomen could represent a lower pole renal calculus measuring 5 mm.  Pelvic venous phleboliths noted.  No acute osseous finding noted.  IMPRESSION: No acute findings in the chest or abdomen by plain radiography.  Possible right inferior pole nephrolithiasis  Original Report Authenticated By: Judie Petit. Ruel Favors, M.D.     1. Abdominal pain   2. Vomiting       MDM   I doubt any other EMC precluding discharge at this time including, but not necessarily limited to the  following:SBI, peritonitis.      Hurman Horn, MD 01/06/12 2212

## 2012-01-06 NOTE — Discharge Instructions (Signed)

## 2012-01-06 NOTE — ED Notes (Signed)
Pt states he recently has been treated for throat cancer, chemo and radiation ended the end of April.  Pt states he started to feel bad about one week ago, decreased appetite, lethargy and some N/V.  Diarrhea x 1 this am.  C/o pelvic pain with decreased urination.  No known fever.

## 2012-01-21 DIAGNOSIS — C01 Malignant neoplasm of base of tongue: Secondary | ICD-10-CM | POA: Insufficient documentation

## 2013-02-23 DIAGNOSIS — H401130 Primary open-angle glaucoma, bilateral, stage unspecified: Secondary | ICD-10-CM | POA: Insufficient documentation

## 2013-02-23 DIAGNOSIS — H5340 Unspecified visual field defects: Secondary | ICD-10-CM | POA: Insufficient documentation

## 2013-03-27 ENCOUNTER — Encounter: Payer: Self-pay | Admitting: *Deleted

## 2013-03-27 ENCOUNTER — Emergency Department
Admission: EM | Admit: 2013-03-27 | Discharge: 2013-03-27 | Disposition: A | Discharge status: Home / Self Care | Payer: Medicare Other | Source: Home / Self Care | Attending: Family Medicine | Admitting: Family Medicine

## 2013-03-27 DIAGNOSIS — B029 Zoster without complications: Secondary | ICD-10-CM

## 2013-03-27 DIAGNOSIS — R21 Rash and other nonspecific skin eruption: Secondary | ICD-10-CM

## 2013-03-27 MED ORDER — PREDNISONE 50 MG PO TABS: ORAL_TABLET | ORAL | Status: DC

## 2013-03-27 MED ORDER — VALACYCLOVIR HCL 1 G PO TABS: 1000.0000 mg | ORAL_TABLET | Freq: Three times a day (TID) | ORAL | Status: DC

## 2013-03-27 NOTE — ED Notes (Signed)
Dean Mayer c/o painful rash since 11am today to right side of upper body. Hx chicken pox as a child.

## 2013-03-27 NOTE — ED Provider Notes (Signed)
CSN: [id number]     Arrival date & time 03/27/13  1916 History     First MD Initiated Contact with Patient 03/27/13 1931     No chief complaint on file.    HPI  HPI  This patient complains of a RASH  Location: R upper neck   Onset: 1 day   Course: Has had mild burning over affected area. Noted rash/blistering over last 24 hours.   Self-treated with: nothing   Improvement with treatment: n/a  Modifying factors: Prior history of throat cancer status post radiation and chemotherapy one year ago. Has been disease free for the past year per wife. History  Itching: no  Tenderness: mild  New medications/antibiotics: no  Pet exposure: no  Recent travel or tropical exposure: no  New soaps, shampoos, detergent, clothing: no  Tick/insect exposure: no  Chemical Exposure: no  Red Flags  Feeling ill: no  Fever: no  Facial/tongue swelling/difficulty breathing: no  Diabetic or immunocompromised: no    Past Medical History  Diagnosis Date  . Throat cancer   . Hypertension   . Kidney stones   . GERD (gastroesophageal reflux disease)   . Eye abnormalities    Past Surgical History  Procedure Laterality Date  . Eye surgery     No family history on file. History  Substance Use Topics  . Smoking status: Former Games developer  . Smokeless tobacco: Not on file  . Alcohol Use: No    Review of Systems  All other systems reviewed and are negative.    Allergies  Review of patient's allergies indicates no known allergies.  Home Medications   Current Outpatient Rx  Name  Route  Sig  Dispense  Refill  . amLODipine (NORVASC) 10 MG tablet   Oral   Take 10 mg by mouth daily.         . bimatoprost (LUMIGAN) 0.03 % ophthalmic solution   Left Eye   Place 1 drop into the left eye 2 (two) times daily.         Marland Kitchen dexamethasone (DECADRON) 4 MG tablet   Oral   Take 4 mg by mouth 2 (two) times daily with a meal. Takes week of chemo then 3 days following         . docusate sodium  (COLACE) 100 MG capsule   Oral   Take 100 mg by mouth 2 (two) times daily as needed.         . famotidine (PEPCID) 20 MG tablet   Oral   Take 20 mg by mouth 2 (two) times daily.         . fluticasone (FLONASE) 50 MCG/ACT nasal spray   Nasal   Place 2 sprays into the nose daily as needed.         Marland Kitchen losartan (COZAAR) 100 MG tablet   Oral   Take 100 mg by mouth daily.         . polyethylene glycol (MIRALAX / GLYCOLAX) packet   Oral   Take 17 g by mouth daily as needed.         . predniSONE (DELTASONE) 50 MG tablet      1 tab daily x 7 days   7 tablet   0   . valACYclovir (VALTREX) 1000 MG tablet   Oral   Take 1 tablet (1,000 mg total) by mouth 3 (three) times daily.   21 tablet   0    There were no vitals taken for this visit. Physical  Exam  Constitutional: He appears well-developed and well-nourished.  HENT:  Head: Normocephalic and atraumatic.  Eyes: Conjunctivae are normal. Pupils are equal, round, and reactive to light.  Neck: Normal range of motion.  Cardiovascular: Normal rate and regular rhythm.   Pulmonary/Chest: Effort normal.  Abdominal: Soft.  Musculoskeletal: Normal range of motion.  Neurological: He is alert.  Skin: Rash noted.     ED Course   Procedures (including critical care time)  Labs Reviewed - No data to display No results found. 1. Shingles     MDM  Rash morphology and distribution most consistent with herpes zoster. Will place on course of prednisone and acyclovir for acute treatment. Discussed general, Derm, neuro red flags. Follow PCP in the next 3-5 days for general reevaluation.     The patient and/or caregiver has been counseled thoroughly with regard to treatment plan and/or medications prescribed including dosage, schedule, interactions, rationale for use, and possible side effects and they verbalize understanding. Diagnoses and expected course of recovery discussed and will return if not improved as expected or if  the condition worsens. Patient and/or caregiver verbalized understanding.        Doree Albee, MD 03/27/13 1946

## 2013-03-31 ENCOUNTER — Telehealth: Payer: Self-pay | Admitting: *Deleted

## 2013-05-23 IMAGING — CR DG ABDOMEN ACUTE W/ 1V CHEST
4 series · 4 of 4 positions shown · non-contrast
Comparison: None.

CLINICAL DATA: Pain, nausea, vomiting, diarrhea

ACUTE ABDOMEN SERIES (ABDOMEN 2 VIEW & CHEST 1 VIEW)

[w chest pa]
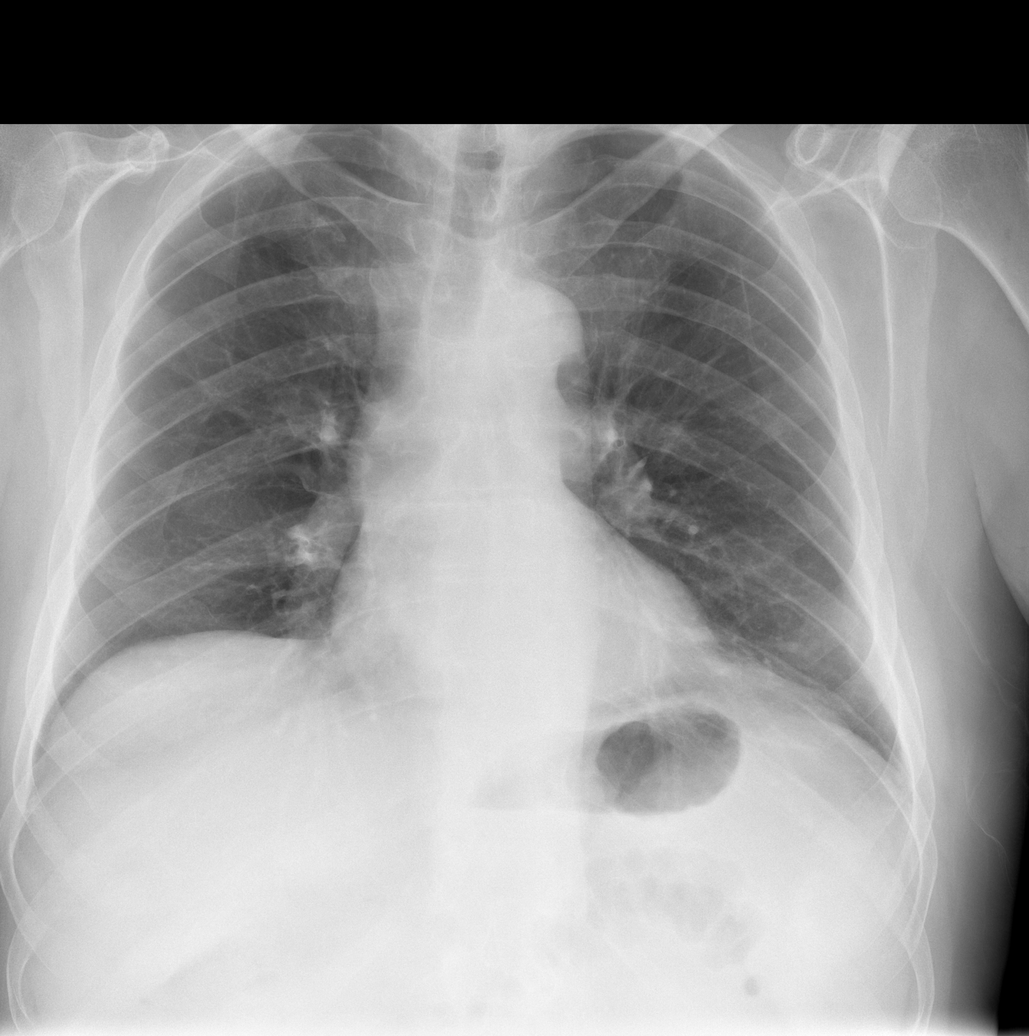

[w abdomen upright]
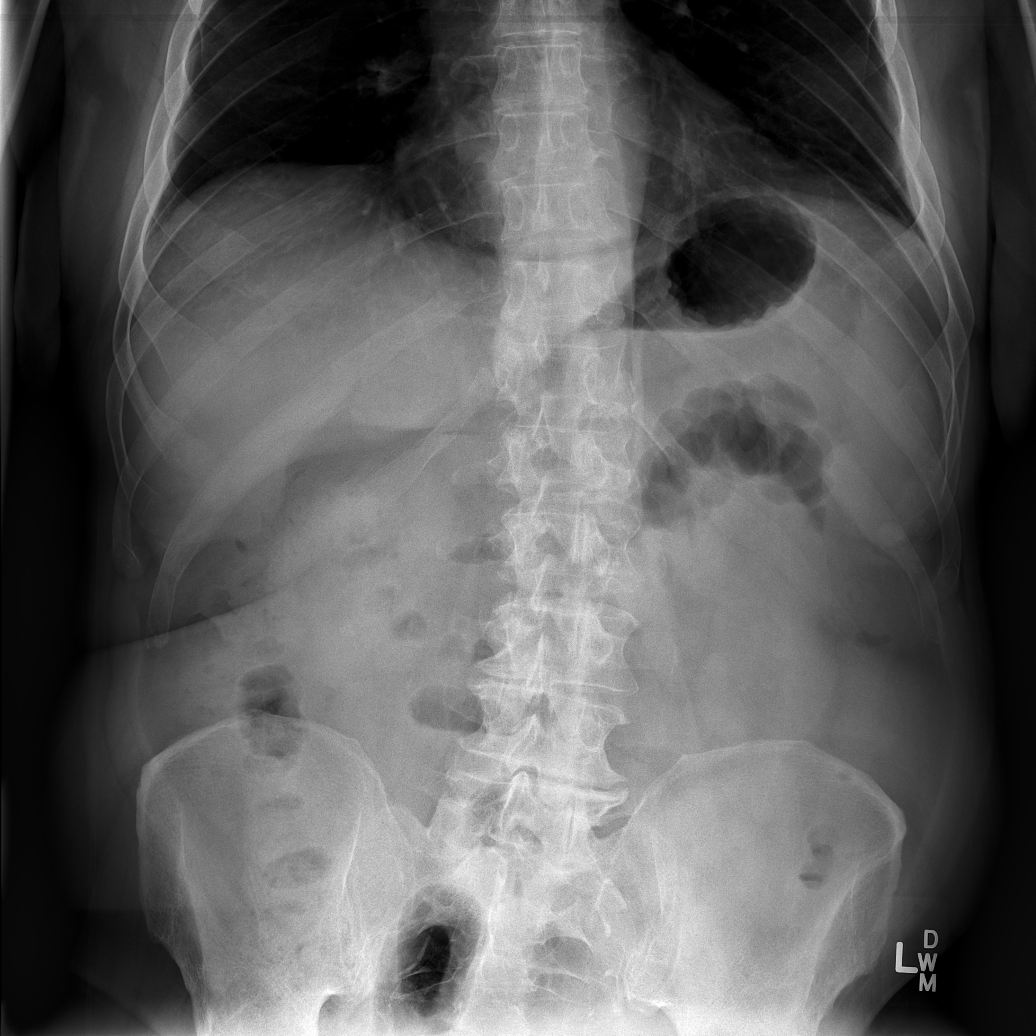

[t abdomen supine (1 of 2)]
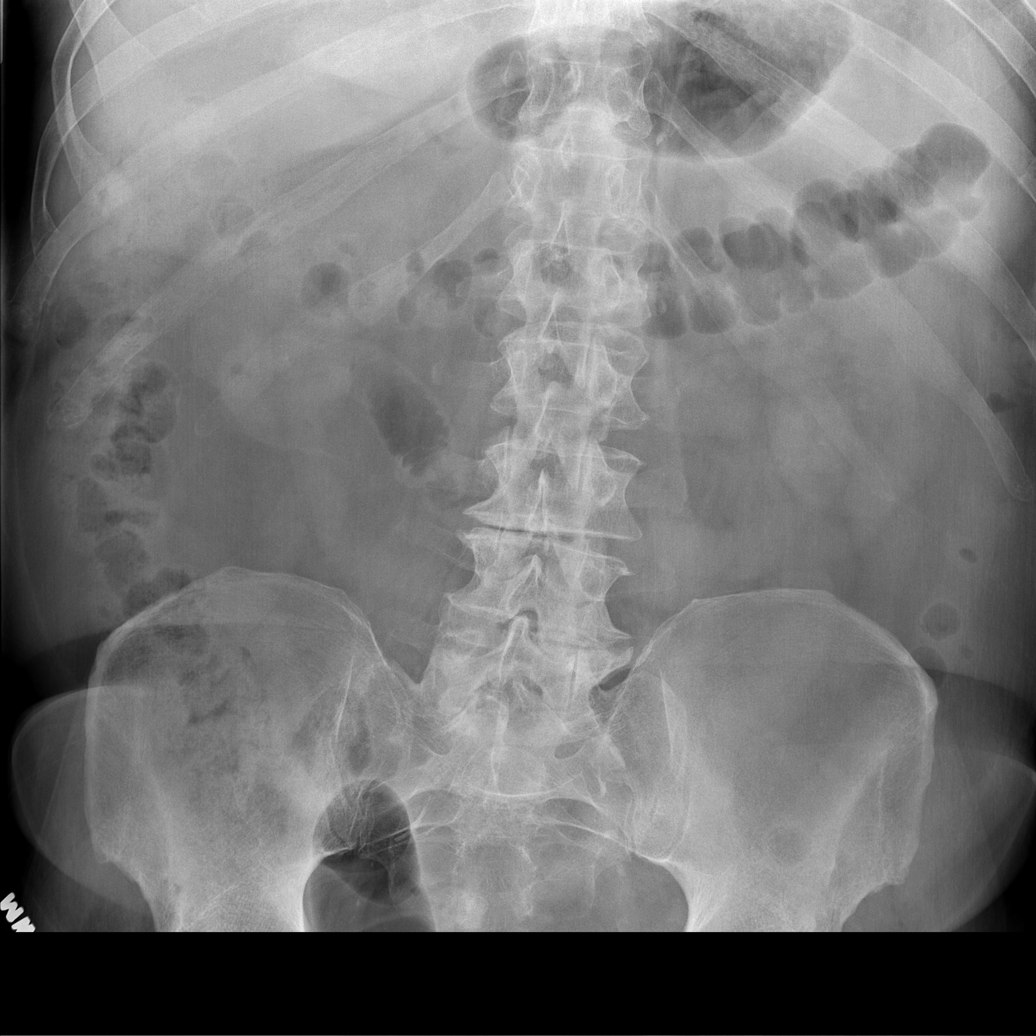

[t abdomen supine (2 of 2)]
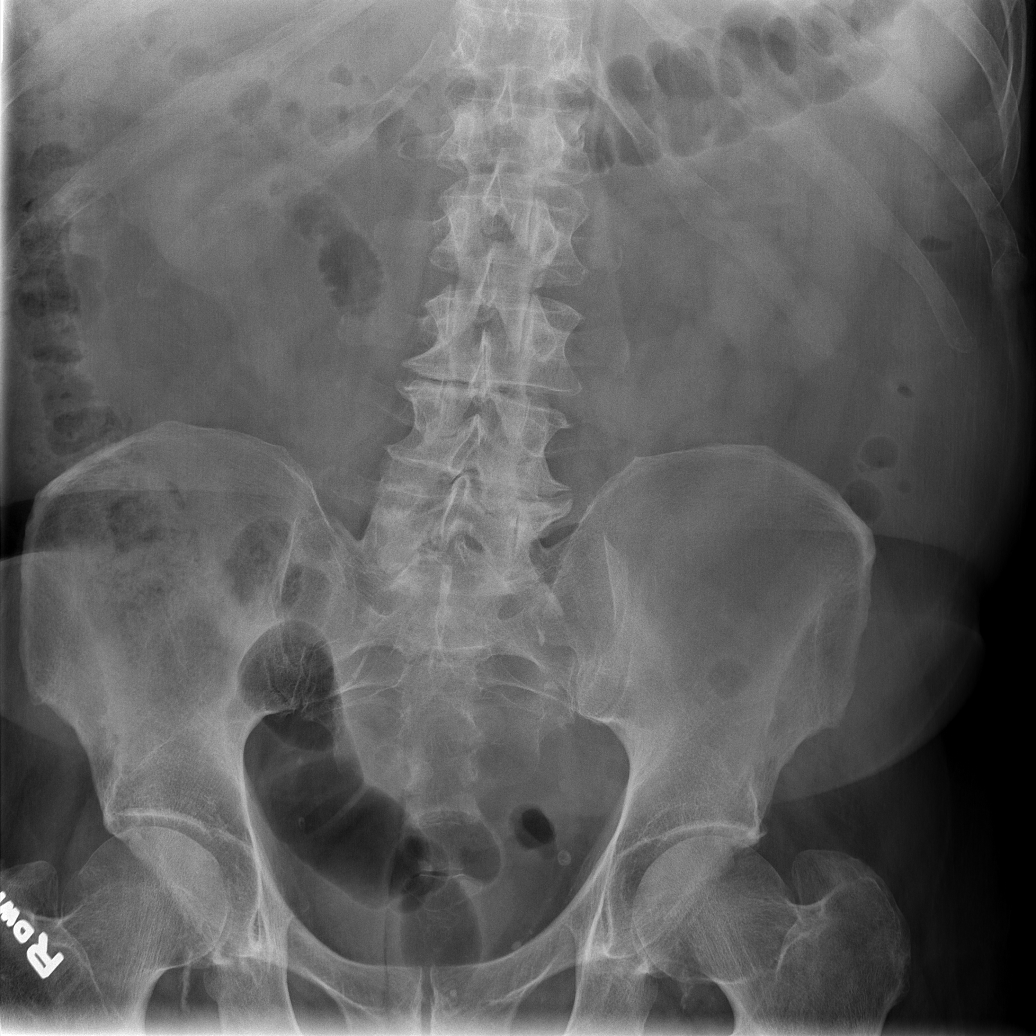

[4 of 4 positions shown; findings below may reference images not displayed]

FINDINGS: Normal heart size and vascularity.  No focal pneumonia,
collapse, consolidation, effusion or pneumothorax.  Trachea
midline.  Scattered air and stool throughout the bowel.  Negative
for dilatation or obstruction.  No free air.  Diffuse degenerative
changes of the lower lumbar spine.  Small calcification in the
right mid abdomen could represent a lower pole renal calculus
measuring 5 mm.  Pelvic venous phleboliths noted.  No acute osseous
finding noted.
IMPRESSION: No acute findings in the chest or abdomen by plain radiography.

Possible right inferior pole nephrolithiasis

## 2014-07-26 DIAGNOSIS — L719 Rosacea, unspecified: Secondary | ICD-10-CM | POA: Insufficient documentation

## 2014-07-26 DIAGNOSIS — H029 Unspecified disorder of eyelid: Secondary | ICD-10-CM | POA: Insufficient documentation

## 2015-04-25 DIAGNOSIS — C44101 Unspecified malignant neoplasm of skin of unspecified eyelid, including canthus: Secondary | ICD-10-CM | POA: Insufficient documentation

## 2015-05-09 DIAGNOSIS — C44111 Basal cell carcinoma of skin of unspecified eyelid, including canthus: Secondary | ICD-10-CM | POA: Insufficient documentation

## 2015-07-27 DIAGNOSIS — H35372 Puckering of macula, left eye: Secondary | ICD-10-CM | POA: Insufficient documentation

## 2015-07-27 DIAGNOSIS — D3131 Benign neoplasm of right choroid: Secondary | ICD-10-CM | POA: Insufficient documentation

## 2016-01-04 DIAGNOSIS — R531 Weakness: Secondary | ICD-10-CM | POA: Insufficient documentation

## 2016-01-04 DIAGNOSIS — R410 Disorientation, unspecified: Secondary | ICD-10-CM | POA: Insufficient documentation

## 2016-01-04 DIAGNOSIS — I639 Cerebral infarction, unspecified: Secondary | ICD-10-CM | POA: Insufficient documentation

## 2016-01-04 DIAGNOSIS — I160 Hypertensive urgency: Secondary | ICD-10-CM | POA: Insufficient documentation

## 2016-01-05 DIAGNOSIS — I6381 Other cerebral infarction due to occlusion or stenosis of small artery: Secondary | ICD-10-CM | POA: Insufficient documentation

## 2017-09-16 DIAGNOSIS — H401113 Primary open-angle glaucoma, right eye, severe stage: Secondary | ICD-10-CM | POA: Insufficient documentation

## 2018-05-28 DIAGNOSIS — C61 Malignant neoplasm of prostate: Secondary | ICD-10-CM | POA: Insufficient documentation

## 2019-03-24 DIAGNOSIS — N401 Enlarged prostate with lower urinary tract symptoms: Secondary | ICD-10-CM | POA: Insufficient documentation

## 2019-03-24 DIAGNOSIS — N138 Other obstructive and reflux uropathy: Secondary | ICD-10-CM | POA: Insufficient documentation

## 2019-04-10 ENCOUNTER — Emergency Department (HOSPITAL_COMMUNITY): Payer: Medicare HMO

## 2019-04-10 ENCOUNTER — Encounter (HOSPITAL_COMMUNITY): Payer: Self-pay

## 2019-04-10 ENCOUNTER — Emergency Department (HOSPITAL_COMMUNITY)
Admission: EM | Admit: 2019-04-10 | Discharge: 2019-04-10 | Disposition: A | Discharge status: Home / Self Care | Payer: Medicare HMO | Attending: Emergency Medicine | Admitting: Emergency Medicine

## 2019-04-10 ENCOUNTER — Other Ambulatory Visit: Payer: Self-pay

## 2019-04-10 DIAGNOSIS — Z87891 Personal history of nicotine dependence: Secondary | ICD-10-CM | POA: Insufficient documentation

## 2019-04-10 DIAGNOSIS — I10 Essential (primary) hypertension: Secondary | ICD-10-CM | POA: Diagnosis not present

## 2019-04-10 DIAGNOSIS — Z9889 Other specified postprocedural states: Secondary | ICD-10-CM | POA: Diagnosis not present

## 2019-04-10 DIAGNOSIS — Z79899 Other long term (current) drug therapy: Secondary | ICD-10-CM | POA: Insufficient documentation

## 2019-04-10 DIAGNOSIS — N3000 Acute cystitis without hematuria: Secondary | ICD-10-CM | POA: Diagnosis not present

## 2019-04-10 DIAGNOSIS — R531 Weakness: Secondary | ICD-10-CM | POA: Diagnosis present

## 2019-04-10 LAB — BASIC METABOLIC PANEL
Anion gap: 12 (ref 5–15)
BUN: 17 mg/dL (ref 8–23)
CO2: 20 mmol/L — ABNORMAL LOW (ref 22–32)
Calcium: 9 mg/dL (ref 8.9–10.3)
Chloride: 100 mmol/L (ref 98–111)
Creatinine, Ser: 1.11 mg/dL (ref 0.61–1.24)
GFR calc Af Amer: >60 mL/min (ref >60)
GFR calc non Af Amer: >60 mL/min (ref >60)
Glucose, Bld: 134 mg/dL — ABNORMAL HIGH (ref 70–99)
Potassium: 3.5 mmol/L (ref 3.5–5.1)
Sodium: 132 mmol/L — ABNORMAL LOW (ref 135–145)

## 2019-04-10 LAB — URINALYSIS, ROUTINE W REFLEX MICROSCOPIC
Bilirubin Urine: NEGATIVE
Glucose, UA: NEGATIVE mg/dL
Ketones, ur: NEGATIVE mg/dL
Nitrite: NEGATIVE
Protein, ur: 30 mg/dL — AB
RBC / HPF: >50 RBC/hpf — ABNORMAL HIGH (ref 0–5)
Specific Gravity, Urine: 1.017 (ref 1.005–1.030)
WBC, UA: >50 WBC/hpf — ABNORMAL HIGH (ref 0–5)
pH: 5 (ref 5.0–8.0)

## 2019-04-10 LAB — CBC
HCT: 43.3 % (ref 39.0–52.0)
Hemoglobin: 14.4 g/dL (ref 13.0–17.0)
MCH: 30.4 pg (ref 26.0–34.0)
MCHC: 33.3 g/dL (ref 30.0–36.0)
MCV: 91.5 fL (ref 80.0–100.0)
Platelets: 169 10*3/uL (ref 150–400)
RBC: 4.73 MIL/uL (ref 4.22–5.81)
RDW: 12.7 % (ref 11.5–15.5)
WBC: 13.9 10*3/uL — ABNORMAL HIGH (ref 4.0–10.5)
nRBC: 0 % (ref 0.0–0.2)

## 2019-04-10 LAB — LACTIC ACID, PLASMA: Lactic Acid, Venous: 1.3 mmol/L (ref 0.5–1.9)

## 2019-04-10 MED ORDER — SODIUM CHLORIDE 0.9 % IV SOLN
1.0000 g | Freq: Once | INTRAVENOUS | Status: AC
  Administered 2019-04-10: 20:00:00 1 g via INTRAVENOUS
  Filled 2019-04-10: qty 10

## 2019-04-10 MED ORDER — CIPROFLOXACIN HCL 500 MG PO TABS: 500.0000 mg | ORAL_TABLET | Freq: Two times a day (BID) | ORAL | 0 refills | Status: DC

## 2019-04-10 MED ORDER — SODIUM CHLORIDE 0.9 % IV SOLN: 1000.0000 mL | INTRAVENOUS | Status: DC

## 2019-04-10 MED ORDER — SODIUM CHLORIDE 0.9 % IV BOLUS (SEPSIS)
1000.0000 mL | Freq: Once | INTRAVENOUS | Status: AC
  Administered 2019-04-10: 20:00:00 1000 mL via INTRAVENOUS

## 2019-04-10 NOTE — ED Triage Notes (Signed)
Pt presents with c/o chills, fever, and painful urination. Pt reports he recently had a procedure and had his urinary catheter removed today but does have pain with urination. Pt is afebrile at this time but reports it was 101.7, he took 2 Tylenol approx 2 hours ago.

## 2019-04-10 NOTE — Discharge Instructions (Addendum)
Take the antibiotics as prescribed, follow-up with your primary care doctor or urologist to make sure your infection is resolving, return to the ER for worsening symptoms

## 2019-04-10 NOTE — ED Provider Notes (Signed)
[facility] DEPT Provider Note   CSN: [id number] Arrival date & time: 04/10/19  1721     History   Chief Complaint Chief Complaint  Patient presents with  . Chills  . Fever    HPI MIVAAN STILLS is a 78 y.o. male.     HPI Pt had a prostate biopsy on the 25th.  He had an indwelling catheter.  Today he was at [facility] to have his catheter removed.  Pt mentioned while he was there he was feeling weak, feverish and chilled.  He asked them to check his urine but he was told they didn't do that during the catheter removal.   Pt went home and he took his temperature.  It was 101.7.  Pt took tylenol at home.  No vomiting or diarrhea but he is nauseated.  No cough.  No rashes.   Past Medical History:  Diagnosis Date  . Eye abnormalities   . GERD (gastroesophageal reflux disease)   . Hypertension   . Kidney stones   . Throat cancer ([place])     There are no active problems to display for this patient.   Past Surgical History:  Procedure Laterality Date  . EYE SURGERY    . TONSILLECTOMY          Home Medications    Prior to Admission medications   Medication Sig Start Date End Date Taking? Authorizing Provider  amLODipine (NORVASC) 10 MG tablet Take 10 mg by mouth daily.    [provider]  bimatoprost (LUMIGAN) 0.03 % ophthalmic solution Place 1 drop into the left eye 2 (two) times daily.    [provider]  ciprofloxacin (CIPRO) 500 MG tablet Take 1 tablet (500 mg total) by mouth 2 (two) times daily. 04/10/19   Dorie Rank, MD  dexamethasone (DECADRON) 4 MG tablet Take 4 mg by mouth 2 (two) times daily with a meal. Takes week of chemo then 3 days following    [provider]  docusate sodium (COLACE) 100 MG capsule Take 100 mg by mouth 2 (two) times daily as needed.    [provider]  famotidine (PEPCID) 20 MG tablet Take 20 mg by mouth 2 (two) times daily.    [provider]  fluticasone (FLONASE) 50  MCG/ACT nasal spray Place 2 sprays into the nose daily as needed.    [provider]  losartan (COZAAR) 100 MG tablet Take 100 mg by mouth daily.    [provider]  polyethylene glycol (MIRALAX / GLYCOLAX) packet Take 17 g by mouth daily as needed.    [provider]  predniSONE (DELTASONE) 50 MG tablet 1 tab daily x 7 days 03/27/13   Deneise Lever, MD  valACYclovir (VALTREX) 1000 MG tablet Take 1 tablet (1,000 mg total) by mouth 3 (three) times daily. 03/27/13   Deneise Lever, MD    Family History Family History  Problem Relation Age of Onset  . Stroke Mother   . Heart attack Mother     Social History Social History   Tobacco Use  . Smoking status: Former Research scientist (life sciences)  . Smokeless tobacco: Never Used  Substance Use Topics  . Alcohol use: No  . Drug use: No     Allergies   Patient has no known allergies.   Review of Systems Review of Systems  All other systems reviewed and are negative.    Physical Exam Updated Vital Signs BP (!) 142/74   Pulse 82  Temp 98.7 F (37.1 C) (Oral)   Resp 16   SpO2 98%   Physical Exam Vitals signs and nursing note reviewed.  Constitutional:      General: He is not in acute distress.    Appearance: He is well-developed.  HENT:     Head: Normocephalic and atraumatic.     Right Ear: External ear normal.     Left Ear: External ear normal.  Eyes:     General: No scleral icterus.       Right eye: No discharge.        Left eye: No discharge.     Conjunctiva/sclera: Conjunctivae normal.  Neck:     Musculoskeletal: Neck supple.     Trachea: No tracheal deviation.  Cardiovascular:     Rate and Rhythm: Normal rate and regular rhythm.  Pulmonary:     Effort: Pulmonary effort is normal. No respiratory distress.     Breath sounds: Normal breath sounds. No stridor. No wheezing or rales.  Abdominal:     General: Bowel sounds are normal. There is no distension.     Palpations: Abdomen is soft.     Tenderness:  There is no abdominal tenderness. There is no guarding or rebound.  Musculoskeletal:        General: No tenderness.  Skin:    General: Skin is warm and dry.     Findings: No rash.  Neurological:     Mental Status: He is alert.     Cranial Nerves: No cranial nerve deficit (no facial droop, extraocular movements intact, no slurred speech).     Sensory: No sensory deficit.     Motor: No abnormal muscle tone or seizure activity.     Coordination: Coordination normal.      ED Treatments / Results  Labs (all labs ordered are listed, but only abnormal results are displayed) Labs Reviewed  URINALYSIS, ROUTINE W REFLEX MICROSCOPIC - Abnormal; Notable for the following components:      Result Value   Color, Urine AMBER (*)    APPearance CLOUDY (*)    Hgb urine dipstick LARGE (*)    Protein, ur 30 (*)    Leukocytes,Ua LARGE (*)    RBC / HPF >50 (*)    WBC, UA >50 (*)    Bacteria, UA MANY (*)    All other components within normal limits  CBC - Abnormal; Notable for the following components:   WBC 13.9 (*)    All other components within normal limits  BASIC METABOLIC PANEL - Abnormal; Notable for the following components:   Sodium 132 (*)    CO2 20 (*)    Glucose, Bld 134 (*)    All other components within normal limits  CULTURE, BLOOD (ROUTINE X 2)  CULTURE, BLOOD (ROUTINE X 2)  URINE CULTURE  LACTIC ACID, PLASMA    EKG None  Radiology Dg Chest Portable 1 View  Result Date: 04/10/2019 CLINICAL DATA:  Cough chills fever EXAM: PORTABLE CHEST 1 VIEW COMPARISON:  None. FINDINGS: The heart size and mediastinal contours are within normal limits. Both lungs are clear. The visualized skeletal structures are unremarkable. IMPRESSION: No acute cardiopulmonary process. Electronically Signed   By: Prudencio Pair M.D.   On: 04/10/2019 19:40    Procedures Procedures (including critical care time)  Medications Ordered in ED Medications  sodium chloride 0.9 % bolus 1,000 mL (1,000 mLs  Intravenous New Bag/Given 04/10/19 1948)    Followed by  0.9 %  sodium chloride infusion (has  no administration in time range)  cefTRIAXone (ROCEPHIN) 1 g in sodium chloride 0.9 % 100 mL IVPB (1 g Intravenous New Bag/Given (Non-Interop) 04/10/19 1955)     Initial Impression / Assessment and Plan / ED Course  I have reviewed the triage vital signs and the nursing notes.  Pertinent labs & imaging results that were available during my care of the patient were reviewed by me and considered in my medical decision making (see chart for details).  Clinical Course as of Apr 09 2118  Fri Apr 10, 2019  1922 UA c/w infection.  Will send off culture.  Start fluids, abx.   [JK]  1923 Pt states he was tested for COVID at [facility] on the 25th.   [JK]  2108 Patient is afebrile here.  He has a normal lactic acid level.   [JK]  2108 Chest x-ray without pneumonia.   [JK]  2113 Pt continue to void well.  Able to use the urinal   [JK]    Clinical Course User Index [JK] Dorie Rank, MD     Patient presented with fevers and chills.  Patient had a recent indwelling Foley catheter removed.  Patient's urinalysis is suggestive of a urinary tract infection.  He did have a fever prior to arrival.  Here in the ED the patient is not febrile.  He appears nontoxic.  He does have a leukocytosis but he does not have a lactic acidosis he is not hypotensive or tachycardic.  I discussed the options of treatment in the hospital versus outpatient management with close monitoring.  Patient is feeling well enough to go home.  He is urinating properly.  We discussed warning signs and precautions. Final Clinical Impressions(s) / ED Diagnoses   Final diagnoses:  Acute cystitis without hematuria    ED Discharge Orders         Ordered    ciprofloxacin (CIPRO) 500 MG tablet  2 times daily     04/10/19 2116           Dorie Rank, MD 04/10/19 2120

## 2019-04-13 LAB — URINE CULTURE: Culture: >=100000 — AB

## 2019-04-14 ENCOUNTER — Telehealth: Payer: Self-pay | Admitting: *Deleted

## 2019-04-14 NOTE — Telephone Encounter (Signed)
Post ED Visit - Positive Culture Follow-up  Culture report reviewed by antimicrobial stewardship pharmacist: [place] Team []  Nathan Batchelder, Pharm.D. []  [address], Pharm.D., BCPS AQ-ID []  Heide Guile, Pharm.D., BCPS []  Parks Neptune, Pharm.D., BCPS []  [place], Pharm.D., BCPS, AAHIVP []  [place], Pharm.D., BCPS, AAHIVP []  Legrand Como, PharmD, BCPS []  Salome Arnt, PharmD, BCPS []  Johnnette Gourd, PharmD, BCPS []  Hughes Better, PharmD []  Leeroy Cha, PharmD, BCPS []  Laqueta Linden, PharmD  [place] Team []  [address], PharmD []  Leodis Sias, PharmD []  Lindell Spar, PharmD []  Royetta Asal, Rph []  Graylin Shiver) Rema Fendt, PharmD []  Glennon Mac, PharmD []  Arlyn Dunning, PharmD [x]  Netta Cedars, PharmD []  Dia Sitter, PharmD []  Leone Haven, PharmD []  Gretta Arab, PharmD []  Theodis Shove, PharmD []  Peggyann Juba, PharmD   Positive urine culture Treated with Ciprofloxacin HCL, organism sensitive to the same and no further patient follow-up is required at this time.  Reuel Boom [facility] 04/14/2019, 12:16 PM

## 2019-04-15 LAB — CULTURE, BLOOD (ROUTINE X 2)
Culture: NO GROWTH
Culture: NO GROWTH
Special Requests: ADEQUATE

## 2021-08-29 DIAGNOSIS — I6529 Occlusion and stenosis of unspecified carotid artery: Secondary | ICD-10-CM | POA: Insufficient documentation

## 2022-11-27 ENCOUNTER — Other Ambulatory Visit: Payer: Self-pay

## 2022-11-27 ENCOUNTER — Emergency Department (HOSPITAL_BASED_OUTPATIENT_CLINIC_OR_DEPARTMENT_OTHER)
Admission: EM | Admit: 2022-11-27 | Discharge: 2022-11-28 | Disposition: A | Discharge status: Home / Self Care | Payer: Medicare HMO | Attending: Emergency Medicine | Admitting: Emergency Medicine

## 2022-11-27 ENCOUNTER — Encounter (HOSPITAL_BASED_OUTPATIENT_CLINIC_OR_DEPARTMENT_OTHER): Payer: Self-pay

## 2022-11-27 DIAGNOSIS — L03115 Cellulitis of right lower limb: Secondary | ICD-10-CM | POA: Diagnosis not present

## 2022-11-27 DIAGNOSIS — M7989 Other specified soft tissue disorders: Secondary | ICD-10-CM | POA: Diagnosis not present

## 2022-11-27 DIAGNOSIS — M79604 Pain in right leg: Secondary | ICD-10-CM | POA: Diagnosis present

## 2022-11-27 MED ORDER — DOXYCYCLINE HYCLATE 100 MG PO TABS
100.0000 mg | ORAL_TABLET | Freq: Once | ORAL | Status: AC
  Administered 2022-11-27: 100 mg via ORAL
  Filled 2022-11-27: qty 1

## 2022-11-27 MED ORDER — DOXYCYCLINE HYCLATE 100 MG PO CAPS: 100.0000 mg | ORAL_CAPSULE | Freq: Two times a day (BID) | ORAL | 0 refills | Status: DC

## 2022-11-27 NOTE — Discharge Instructions (Signed)
Take 4 over the counter ibuprofen tablets 3 times a day or 2 over-the-counter naproxen tablets twice a day for pain. Also take tylenol 1000mg(2 extra strength) four times a day.    

## 2022-11-27 NOTE — ED Triage Notes (Addendum)
Patient here POV from Home.  Endorses Right Lower Leg Swelling, Pain, and Redness that began 8 Days ago. Seen by PCP which provided Steroids and Medications for Gout.   Began to have a fever today of 100.9. No Anticoagulants.   NAD Noted during Triage. A&Ox4. GCS 15. BIB Wheelchair.

## 2022-11-27 NOTE — ED Provider Notes (Signed)
[place] EMERGENCY DEPARTMENT AT [facility] Provider Note   CSN: [id number] Arrival date & time: 11/27/22  2250     History  Chief Complaint  Patient presents with   Leg Pain    Dean Mayer is a 82 y.o. male.  82 yo M with a chief complaints of right-sided leg pain and swelling.  This been going on for the better part of the week.  He developed a fever this evening and so his wife brought him here for evaluation.  He was being treated presumptively for gout.  Was on indomethacin and colchicine and steroids.   Leg Pain      Home Medications Prior to Admission medications   Medication Sig Start Date End Date Taking? Authorizing Provider  doxycycline (VIBRAMYCIN) 100 MG capsule Take 1 capsule (100 mg total) by mouth 2 (two) times daily. One po bid x 7 days 11/27/22  Yes Melene Plan, DO  amLODipine (NORVASC) 10 MG tablet Take 10 mg by mouth daily.    [provider]  bimatoprost (LUMIGAN) 0.03 % ophthalmic solution Place 1 drop into the left eye 2 (two) times daily.    [provider]  ciprofloxacin (CIPRO) 500 MG tablet Take 1 tablet (500 mg total) by mouth 2 (two) times daily. 04/10/19   Linwood Dibbles, MD  dexamethasone (DECADRON) 4 MG tablet Take 4 mg by mouth 2 (two) times daily with a meal. Takes week of chemo then 3 days following    [provider]  docusate sodium (COLACE) 100 MG capsule Take 100 mg by mouth 2 (two) times daily as needed.    [provider]  famotidine (PEPCID) 20 MG tablet Take 20 mg by mouth 2 (two) times daily.    [provider]  fluticasone (FLONASE) 50 MCG/ACT nasal spray Place 2 sprays into the nose daily as needed.    [provider]  losartan (COZAAR) 100 MG tablet Take 100 mg by mouth daily.    [provider]  polyethylene glycol (MIRALAX / GLYCOLAX) packet Take 17 g by mouth daily as needed.    [provider]  predniSONE (DELTASONE) 50 MG tablet 1 tab daily x 7  days 03/27/13   Floydene Flock, MD  valACYclovir (VALTREX) 1000 MG tablet Take 1 tablet (1,000 mg total) by mouth 3 (three) times daily. 03/27/13   Floydene Flock, MD      Allergies    Patient has no known allergies.    Review of Systems   Review of Systems  Physical Exam Updated Vital Signs BP (!) 142/53 (BP Location: Left Arm)   Pulse 99   Temp 98.4 F (36.9 C) (Oral)   Resp 19   Ht  (1.93 m)   Wt 116.1 kg   SpO2 99%   BMI 31.16 kg/m  Physical Exam Vitals and nursing note reviewed.  Constitutional:      Appearance: He is well-developed.  HENT:     Head: Normocephalic and atraumatic.  Eyes:     Pupils: Pupils are equal, round, and reactive to light.  Neck:     Vascular: No JVD.  Cardiovascular:     Rate and Rhythm: Normal rate and regular rhythm.     Heart sounds: No murmur heard.    No friction rub. No gallop.  Pulmonary:     Effort: No respiratory distress.     Breath sounds: No wheezing.  Abdominal:     General: There is no distension.  Tenderness: There is no abdominal tenderness. There is no guarding or rebound.  Musculoskeletal:        General: Swelling present. Normal range of motion.     Cervical back: Normal range of motion and neck supple.     Comments: Pain and swelling to the right lower extremity.  Patient has skin that is warm to the touch and erythematous.  It does overlie the medial aspect of the ankle.  I am able to range the ankle without significant discomfort.  He has much more discomfort along palpation of the skin.  Pulse motor and sensation intact distally.  No obvious break in the skin.  No fluctuance no induration.  Skin:    Coloration: Skin is not pale.     Findings: No rash.  Neurological:     Mental Status: He is alert and oriented to person, place, and time.  Psychiatric:        Behavior: Behavior normal.     ED Results / Procedures / Treatments   Labs (all labs ordered are listed, but only abnormal results are  displayed) Labs Reviewed - No data to display  EKG None  Radiology No results found.  Procedures Procedures    Medications Ordered in ED Medications  doxycycline (VIBRA-TABS) tablet 100 mg (has no administration in time range)    ED Course/ Medical Decision Making/ A&P                             Medical Decision Making Risk Prescription drug management.   82 yo M with a cc of right-sided leg pain and swelling.  Clinically the patient has cellulitis.  Could also be consistent with a DVT though unlikely based on his fever.  No chest symptoms.  The patient tells me his swelling and redness is actually mildly improved since yesterday.  He is well-appearing nontoxic.  Will do a trial of oral antibiotics.  11:53 PM:  I have discussed the diagnosis/risks/treatment options with the patient.  Evaluation and diagnostic testing in the emergency department does not suggest an emergent condition requiring admission or immediate intervention beyond what has been performed at this time.  They will follow up with PCP. We also discussed returning to the ED immediately if new or worsening sx occur. We discussed the sx which are most concerning (e.g., sudden worsening pain, fever, inability to tolerate by mouth) that necessitate immediate return. Medications administered to the patient during their visit and any new prescriptions provided to the patient are listed below.  Medications given during this visit Medications  doxycycline (VIBRA-TABS) tablet 100 mg (has no administration in time range)     The patient appears reasonably screen and/or stabilized for discharge and I doubt any other medical condition or other [facility] requiring further screening, evaluation, or treatment in the ED at this time prior to discharge.          Final Clinical Impression(s) / ED Diagnoses Final diagnoses:  Cellulitis of right lower extremity    Rx / DC Orders ED Discharge Orders          Ordered     doxycycline (VIBRAMYCIN) 100 MG capsule  2 times daily        11/27/22 2344    US Venous Img Lower Unilateral Right        11/27/22 2350              Melene Plan, DO 11/27/22 2353

## 2022-11-28 ENCOUNTER — Telehealth (HOSPITAL_BASED_OUTPATIENT_CLINIC_OR_DEPARTMENT_OTHER): Payer: Self-pay | Admitting: Emergency Medicine

## 2022-11-28 ENCOUNTER — Other Ambulatory Visit (HOSPITAL_BASED_OUTPATIENT_CLINIC_OR_DEPARTMENT_OTHER): Payer: Self-pay | Admitting: Emergency Medicine

## 2022-11-28 ENCOUNTER — Ambulatory Visit (HOSPITAL_BASED_OUTPATIENT_CLINIC_OR_DEPARTMENT_OTHER)
Admission: RE | Admit: 2022-11-28 | Discharge: 2022-11-28 | Disposition: A | Discharge status: Home / Self Care | Payer: Medicare HMO | Source: Ambulatory Visit | Attending: Emergency Medicine | Admitting: Emergency Medicine

## 2022-11-28 DIAGNOSIS — M7989 Other specified soft tissue disorders: Secondary | ICD-10-CM | POA: Insufficient documentation

## 2022-11-28 DIAGNOSIS — L03115 Cellulitis of right lower limb: Secondary | ICD-10-CM

## 2022-11-28 NOTE — Telephone Encounter (Signed)
Patient return to the ER today to have right lower extremity DVT study.  Study was negative.  I was able to inform the patient over the phone and he was recommended primary care follow-up.

## 2022-12-01 ENCOUNTER — Inpatient Hospital Stay (HOSPITAL_COMMUNITY)
Admission: EM | Admit: 2022-12-01 | Discharge: 2022-12-05 | DRG: 603 | Disposition: A | Discharge status: Home / Self Care | Payer: Medicare HMO | Attending: Family Medicine | Admitting: Family Medicine

## 2022-12-01 ENCOUNTER — Emergency Department (HOSPITAL_COMMUNITY): Payer: Medicare HMO

## 2022-12-01 ENCOUNTER — Emergency Department (HOSPITAL_BASED_OUTPATIENT_CLINIC_OR_DEPARTMENT_OTHER): Payer: Medicare HMO

## 2022-12-01 ENCOUNTER — Encounter (HOSPITAL_COMMUNITY): Payer: Self-pay

## 2022-12-01 ENCOUNTER — Other Ambulatory Visit: Payer: Self-pay

## 2022-12-01 DIAGNOSIS — Z7982 Long term (current) use of aspirin: Secondary | ICD-10-CM

## 2022-12-01 DIAGNOSIS — L03115 Cellulitis of right lower limb: Secondary | ICD-10-CM | POA: Diagnosis not present

## 2022-12-01 DIAGNOSIS — Z79890 Hormone replacement therapy (postmenopausal): Secondary | ICD-10-CM

## 2022-12-01 DIAGNOSIS — M79661 Pain in right lower leg: Secondary | ICD-10-CM

## 2022-12-01 DIAGNOSIS — Z87442 Personal history of urinary calculi: Secondary | ICD-10-CM

## 2022-12-01 DIAGNOSIS — I739 Peripheral vascular disease, unspecified: Secondary | ICD-10-CM

## 2022-12-01 DIAGNOSIS — Z85819 Personal history of malignant neoplasm of unspecified site of lip, oral cavity, and pharynx: Secondary | ICD-10-CM

## 2022-12-01 DIAGNOSIS — L0390 Cellulitis, unspecified: Secondary | ICD-10-CM | POA: Diagnosis not present

## 2022-12-01 DIAGNOSIS — Z8249 Family history of ischemic heart disease and other diseases of the circulatory system: Secondary | ICD-10-CM

## 2022-12-01 DIAGNOSIS — Z8673 Personal history of transient ischemic attack (TIA), and cerebral infarction without residual deficits: Secondary | ICD-10-CM

## 2022-12-01 DIAGNOSIS — E785 Hyperlipidemia, unspecified: Secondary | ICD-10-CM | POA: Diagnosis present

## 2022-12-01 DIAGNOSIS — Z87891 Personal history of nicotine dependence: Secondary | ICD-10-CM

## 2022-12-01 DIAGNOSIS — E039 Hypothyroidism, unspecified: Secondary | ICD-10-CM | POA: Diagnosis present

## 2022-12-01 DIAGNOSIS — R0609 Other forms of dyspnea: Secondary | ICD-10-CM | POA: Diagnosis present

## 2022-12-01 DIAGNOSIS — Z79899 Other long term (current) drug therapy: Secondary | ICD-10-CM

## 2022-12-01 DIAGNOSIS — R209 Unspecified disturbances of skin sensation: Secondary | ICD-10-CM | POA: Diagnosis present

## 2022-12-01 DIAGNOSIS — I10 Essential (primary) hypertension: Secondary | ICD-10-CM | POA: Diagnosis present

## 2022-12-01 DIAGNOSIS — Z8546 Personal history of malignant neoplasm of prostate: Secondary | ICD-10-CM

## 2022-12-01 DIAGNOSIS — Z66 Do not resuscitate: Secondary | ICD-10-CM | POA: Diagnosis present

## 2022-12-01 DIAGNOSIS — R600 Localized edema: Secondary | ICD-10-CM

## 2022-12-01 DIAGNOSIS — Z923 Personal history of irradiation: Secondary | ICD-10-CM

## 2022-12-01 DIAGNOSIS — Z9221 Personal history of antineoplastic chemotherapy: Secondary | ICD-10-CM

## 2022-12-01 LAB — CBC WITH DIFFERENTIAL/PLATELET
Abs Immature Granulocytes: 0.04 10*3/uL (ref 0.00–0.07)
Basophils Absolute: 0 10*3/uL (ref 0.0–0.1)
Basophils Relative: 0 %
Eosinophils Absolute: 0.1 10*3/uL (ref 0.0–0.5)
Eosinophils Relative: 1 %
HCT: 37.9 % — ABNORMAL LOW (ref 39.0–52.0)
Hemoglobin: 12.9 g/dL — ABNORMAL LOW (ref 13.0–17.0)
Immature Granulocytes: 1 %
Lymphocytes Relative: 11 %
Lymphs Abs: 0.8 10*3/uL (ref 0.7–4.0)
MCH: 30.9 pg (ref 26.0–34.0)
MCHC: 34 g/dL (ref 30.0–36.0)
MCV: 90.7 fL (ref 80.0–100.0)
Monocytes Absolute: 0.4 10*3/uL (ref 0.1–1.0)
Monocytes Relative: 7 %
Neutro Abs: 5.3 10*3/uL (ref 1.7–7.7)
Neutrophils Relative %: 80 %
Platelets: 174 10*3/uL (ref 150–400)
RBC: 4.18 MIL/uL — ABNORMAL LOW (ref 4.22–5.81)
RDW: 13 % (ref 11.5–15.5)
WBC: 6.6 10*3/uL (ref 4.0–10.5)
nRBC: 0 % (ref 0.0–0.2)

## 2022-12-01 LAB — COMPREHENSIVE METABOLIC PANEL
ALT: 34 U/L (ref 0–44)
AST: 35 U/L (ref 15–41)
Albumin: 2.3 g/dL — ABNORMAL LOW (ref 3.5–5.0)
Alkaline Phosphatase: 109 U/L (ref 38–126)
Anion gap: 9 (ref 5–15)
BUN: 13 mg/dL (ref 8–23)
CO2: 25 mmol/L (ref 22–32)
Calcium: 8.3 mg/dL — ABNORMAL LOW (ref 8.9–10.3)
Chloride: 100 mmol/L (ref 98–111)
Creatinine, Ser: 0.95 mg/dL (ref 0.61–1.24)
GFR, Estimated: >60 mL/min (ref >60)
Glucose, Bld: 117 mg/dL — ABNORMAL HIGH (ref 70–99)
Potassium: 3.9 mmol/L (ref 3.5–5.1)
Sodium: 134 mmol/L — ABNORMAL LOW (ref 135–145)
Total Bilirubin: 1.2 mg/dL (ref 0.3–1.2)
Total Protein: 5.7 g/dL — ABNORMAL LOW (ref 6.5–8.1)

## 2022-12-01 LAB — URINALYSIS, ROUTINE W REFLEX MICROSCOPIC
Bilirubin Urine: NEGATIVE
Glucose, UA: NEGATIVE mg/dL
Hgb urine dipstick: NEGATIVE
Ketones, ur: NEGATIVE mg/dL
Leukocytes,Ua: NEGATIVE
Nitrite: NEGATIVE
Protein, ur: NEGATIVE mg/dL
Specific Gravity, Urine: 1.013 (ref 1.005–1.030)
pH: 5 (ref 5.0–8.0)

## 2022-12-01 LAB — TSH: TSH: 3.583 u[IU]/mL (ref 0.350–4.500)

## 2022-12-01 LAB — BRAIN NATRIURETIC PEPTIDE: B Natriuretic Peptide: 147.5 pg/mL — ABNORMAL HIGH (ref 0.0–100.0)

## 2022-12-01 LAB — LACTIC ACID, PLASMA
Lactic Acid, Venous: 0.9 mmol/L (ref 0.5–1.9)
Lactic Acid, Venous: 1 mmol/L (ref 0.5–1.9)

## 2022-12-01 MED ORDER — MORPHINE SULFATE (PF) 4 MG/ML IV SOLN
4.0000 mg | Freq: Once | INTRAVENOUS | Status: AC
  Administered 2022-12-01: 4 mg via INTRAVENOUS
  Filled 2022-12-01: qty 1

## 2022-12-01 MED ORDER — ACETAMINOPHEN 650 MG RE SUPP: 650.0000 mg | Freq: Four times a day (QID) | RECTAL | Status: DC | PRN

## 2022-12-01 MED ORDER — LISINOPRIL 10 MG PO TABS
5.0000 mg | ORAL_TABLET | Freq: Once | ORAL | Status: AC
  Administered 2022-12-01: 5 mg via ORAL
  Filled 2022-12-01: qty 1

## 2022-12-01 MED ORDER — SODIUM CHLORIDE 0.9 % IV SOLN
2.0000 g | INTRAVENOUS | Status: DC
  Administered 2022-12-02 – 2022-12-03 (×2): 2 g via INTRAVENOUS
  Filled 2022-12-01 (×2): qty 20

## 2022-12-01 MED ORDER — ENOXAPARIN SODIUM 60 MG/0.6ML IJ SOSY
50.0000 mg | PREFILLED_SYRINGE | INTRAMUSCULAR | Status: DC
  Administered 2022-12-01: 50 mg via SUBCUTANEOUS
  Filled 2022-12-01: qty 0.6

## 2022-12-01 MED ORDER — LEVOTHYROXINE SODIUM 100 MCG PO TABS
100.0000 ug | ORAL_TABLET | Freq: Every day | ORAL | Status: DC
  Administered 2022-12-02 – 2022-12-05 (×4): 100 ug via ORAL
  Filled 2022-12-01 (×4): qty 1

## 2022-12-01 MED ORDER — OXYCODONE HCL 5 MG PO TABS
5.0000 mg | ORAL_TABLET | ORAL | Status: DC | PRN
  Administered 2022-12-02 – 2022-12-05 (×9): 5 mg via ORAL
  Filled 2022-12-01 (×9): qty 1

## 2022-12-01 MED ORDER — SENNA 8.6 MG PO TABS
1.0000 | ORAL_TABLET | Freq: Two times a day (BID) | ORAL | Status: DC
  Administered 2022-12-01 – 2022-12-05 (×7): 8.6 mg via ORAL
  Filled 2022-12-01 (×8): qty 1

## 2022-12-01 MED ORDER — ACETAMINOPHEN 325 MG PO TABS
650.0000 mg | ORAL_TABLET | Freq: Four times a day (QID) | ORAL | Status: DC
  Administered 2022-12-01 – 2022-12-05 (×13): 650 mg via ORAL
  Filled 2022-12-01 (×14): qty 2

## 2022-12-01 MED ORDER — MORPHINE SULFATE (PF) 4 MG/ML IV SOLN
4.0000 mg | INTRAVENOUS | Status: DC | PRN
  Administered 2022-12-01: 4 mg via INTRAVENOUS
  Filled 2022-12-01: qty 1

## 2022-12-01 MED ORDER — ACETAMINOPHEN 650 MG RE SUPP: 650.0000 mg | Freq: Four times a day (QID) | RECTAL | Status: DC

## 2022-12-01 MED ORDER — SODIUM CHLORIDE 0.9 % IV SOLN
2.0000 g | Freq: Once | INTRAVENOUS | Status: AC
  Administered 2022-12-01: 2 g via INTRAVENOUS
  Filled 2022-12-01: qty 20

## 2022-12-01 MED ORDER — ASPIRIN 325 MG PO TBEC
325.0000 mg | DELAYED_RELEASE_TABLET | Freq: Every day | ORAL | Status: DC
  Administered 2022-12-01 – 2022-12-05 (×5): 325 mg via ORAL
  Filled 2022-12-01 (×5): qty 1

## 2022-12-01 MED ORDER — ATORVASTATIN CALCIUM 40 MG PO TABS
40.0000 mg | ORAL_TABLET | Freq: Every evening | ORAL | Status: DC
  Administered 2022-12-01 – 2022-12-04 (×4): 40 mg via ORAL
  Filled 2022-12-01 (×4): qty 1

## 2022-12-01 MED ORDER — ACETAMINOPHEN 325 MG PO TABS: 650.0000 mg | ORAL_TABLET | Freq: Four times a day (QID) | ORAL | Status: DC | PRN

## 2022-12-01 MED ORDER — POLYETHYLENE GLYCOL 3350 17 G PO PACK
17.0000 g | PACK | Freq: Every day | ORAL | Status: DC
  Administered 2022-12-01 – 2022-12-03 (×3): 17 g via ORAL
  Filled 2022-12-01 (×3): qty 1

## 2022-12-01 MED ORDER — BRIMONIDINE TARTRATE 0.2 % OP SOLN
1.0000 [drp] | Freq: Two times a day (BID) | OPHTHALMIC | Status: DC
  Administered 2022-12-02 – 2022-12-05 (×7): 1 [drp] via OPHTHALMIC
  Filled 2022-12-01 (×2): qty 5

## 2022-12-01 MED ORDER — ADULT MULTIVITAMIN W/MINERALS CH
1.0000 | ORAL_TABLET | Freq: Every day | ORAL | Status: DC
  Administered 2022-12-01 – 2022-12-05 (×5): 1 via ORAL
  Filled 2022-12-01 (×5): qty 1

## 2022-12-01 MED ORDER — AMLODIPINE BESYLATE 5 MG PO TABS
10.0000 mg | ORAL_TABLET | Freq: Once | ORAL | Status: AC
  Administered 2022-12-01: 10 mg via ORAL
  Filled 2022-12-01: qty 2

## 2022-12-01 MED ORDER — AMLODIPINE BESYLATE 10 MG PO TABS
10.0000 mg | ORAL_TABLET | Freq: Every day | ORAL | Status: DC
  Administered 2022-12-02 – 2022-12-05 (×4): 10 mg via ORAL
  Filled 2022-12-01 (×4): qty 1

## 2022-12-01 MED ORDER — ACETAMINOPHEN 500 MG PO TABS: 1000.0000 mg | ORAL_TABLET | Freq: Once | ORAL | Status: DC

## 2022-12-01 MED ORDER — TAMSULOSIN HCL 0.4 MG PO CAPS
0.4000 mg | ORAL_CAPSULE | Freq: Every day | ORAL | Status: DC
  Administered 2022-12-01 – 2022-12-05 (×5): 0.4 mg via ORAL
  Filled 2022-12-01 (×5): qty 1

## 2022-12-01 MED ORDER — LISINOPRIL 5 MG PO TABS
5.0000 mg | ORAL_TABLET | Freq: Two times a day (BID) | ORAL | Status: DC
  Administered 2022-12-02 – 2022-12-05 (×7): 5 mg via ORAL
  Filled 2022-12-01 (×8): qty 1

## 2022-12-01 NOTE — H&P (Addendum)
Hospital Admission History and Physical Service Pager: [phone]  Patient name: Dean Mayer Medical record number: [id number] Date of Birth: 03-31-1941 Age: 82 y.o. Gender: male  Primary Care Provider: Cordial, Harlan Stains, MD Consultants: None Code Status: DNR/DNI confirmed with patient Preferred Emergency Contact:  Contact Information     Name Relation Home Work Mobile   [place] Spouse [phone]         Chief Complaint: Right leg pain  Assessment and Plan: Dean Mayer is a 82 y.o. male presenting with right leg pain and swelling. Differential for this patient's presentation of this includes cellulitis refractory to outpatient antibiotic treatment.  Patient was on doxycycline/clindamycin outpatient.  Appearance of cellulitis seems more to be like strep.  Could be that patient's bacteria was not covered by antibiotic.  Will doubt DVT as venous ultrasound was negative.  Leg swelling could also be due to new onset heart failure.  Right leg swelling is worse than left and right leg has erythema as well.  Thus, there is most likely an infection contributing to right leg appearance.  Nephrotic syndrome unlikely given protein on UA.   Dyspnea on exertion Chronic dyspnea with signs of bilateral lower extremity edema points to possible heart failure.  Does have tobacco use history however remote.  Less likely to be due to COPD.  He does not have any wheezing on exam.  Chest x-ray without acute cardiopulmonary findings.  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 - BNP pending - Follow-up echocardiogram - Strict ins and outs - Follow weights  Bilateral cold feet Patient with bilateral cold, gray, hairless feet.  Concern for possible PAD.  Has had bilateral ABI in the past in 2022 without evidence of PAD at that time.  However PAD could be a risk factor causing patient to have cellulitis without any evidence of injury. - Consider bilateral  ABIs -Continue home ASA 325 mg, atorvastatin 40 mg - Continue to monitor  Hypertension Hypertensive on admission. Did not take his medications today as he came to the ED early in the morning. Home medications: amlodipine 10 mg, lisinopril 5 mg BID - Continue amlodipine and lisinopril  Cellulitis of right leg Refractory to antibiotic therapy.  DVT ultrasound negative. See possible differential above.  No localized wound, could be at risk with possible PAD.  See "bilateral cold feet" problem. - Admit to FPTS, Attending Dr. Jennette Kettle  - Continue IV ceftriaxone - F/u MRSA swab  - Monitor vitals  - PT eval and treat  - fall precautions, delirium precautions  -Marked erythematous area with marker   Chronic Conditions:  Hypothyroidism - Continue levothyroxine 100 mcg History of carotid stenosis -continue atorvastatin 40 mg, aspirin 325 mg BPH/history of prostate cancer status postradiation and chemotherapy -continue tamsulosin 0.4 mg  FEN/GI: Regular VTE Prophylaxis: Lovenox  Disposition: pending clinical improvement  History of Present Illness:  Dean Mayer is a 82 y.o. male presenting with right leg pain  Patient has had redness and swelling in his right leg for the past 2 weeks.  At first he thought this was gout as it started near his ankle and was placed on colchicine, indomethacin, and steroids.  Patient went to the ED on 4/23 for this problem.  At that time he was prescribed doxycycline.  He took this for 3 days; however, he had diarrhea as a side effect.  Thus went to his PCP and was prescribed with clindamycin for which  she took for 5 days.  However, his redness and swelling have worsened.  He has had fevers max temperature of 100.9.  Patient also endorsed nausea without vomiting.  Patient's wife mentions that patient has been having dyspnea on exertion for the past couple years.  Denies chest pain.  Denies orthopnea.  No cough, fevers, weight loss.  Also has noticed bilateral  lower extremity swelling however patient says that has only showed up more recently about 3 to 4 weeks ago.  Mentions he has had an echo in the past but that was in the 90s.  Denies chest pain, shortness of breath at this time, lightheadedness, blurry vision.  In the ED, patient is hemodynamically stable.  He is afebrile.  UA was unremarkable blood cultures were collected.  Right ankle x-ray was significant for soft tissue edema and swelling and no bony abnormalities or soft tissue gas identified.  Venous ultrasound was negative for any DVT.  Patient was given 4 mg of morphine and started on IV ceftriaxone.  Review Of Systems: Per HPI with the following additions: right leg pain, swelling  Pertinent Past Medical History: History of throat cancer s/p radiation, prostate cancer s/p radiation and chemo  Hypertension Right carotid stenosis s/p TCAR  HLD  Hypothyroidism  History of CVA Remainder reviewed in history tab.   Pertinent Past Surgical History: Eye surgery TCAR  Remainder reviewed in history tab.   Pertinent Social History: Tobacco use: Former, 1ppd for 30 years, quit 30 years ago  Alcohol use: seldom Other Substance use: none Lives with wife   Pertinent Family History: Remainder reviewed in history tab.   Important Outpatient Medications: Amlodipine 10 mg Lisinopril 5 mg twice daily Atorvastatin 40 mg Tamsulosin 0.4 mg Levothyroxine 100 mcg Aspirin 325 mg Remainder reviewed in medication history.   Objective: BP (!) 176/78 (BP Location: Right Arm)   Pulse 88   Temp 97.6 F (36.4 C)   Resp 18   Ht 6\' 4"  (1.93 m)   Wt 116.1 kg   SpO2 98%   BMI 31.16 kg/m  Exam: General: Well-appearing, no acute distress ENTM: Moist mucous membranes Neck: No JVD visible Cardiovascular: RRR, radial pulses palpable bilaterally, cap refill less than 2 seconds, left leg 2+ pitting edema to the midshin, right leg 4+ pitting edema to above the knee, unable to palpate dorsal pedal  or posterior tibial pulses due to edema, bilateral feet cold to touch, gray, hairless Respiratory: Increased work of breathing upon exertion, no increased work of breathing at rest, CTAB Gastrointestinal: Reducible midline ventral bulge above the umbilicus, large taut abdomen, though soft, no guarding or tenderness to palpation MSK: Good range of motion and mobility Derm: Erythema without abrasions, excoriation, ulceration from mid right foot till mid right shin most erythematous near ankle, no purulence or drainage Neuro: Alert and oriented x 4, intact sensation of bilateral feet  Labs:  CBC BMET  Recent Labs  Lab 12/01/22 1140  WBC 6.6  HGB 12.9*  HCT 37.9*  PLT 174   Recent Labs  Lab 12/01/22 1140  NA 134*  K 3.9  CL 100  CO2 25  BUN 13  CREATININE 0.95  GLUCOSE 117*  CALCIUM 8.3*    Lactic acid 1.0, TSH wnl   EKG: Normal rate, NSR, Qtc 392    Imaging Studies Performed:  DG R Ankle Soft tissue edema and swelling. No other acute abnormalities. No soft tissue gas identified.  DG CXR  Limited study as above without acute abnormality to  explain signs of infection. My Interpretation: possible hazy opacity on left side, could be due to positioning   Lockie Mola, MD 12/01/2022, 5:46 PM PGY-1, [facility]  FPTS Intern pager: [phone], text pages welcome Secure chat group [facility] Teaching Service

## 2022-12-01 NOTE — Assessment & Plan Note (Signed)
Patient with bilateral cold, hairless feet.  Concern for possible PAD.  Has had bilateral ABI in the past in 2022 without evidence of PAD at that time. ABIs with noncompressible RLE arteries on ABIs. - Continue home ASA 325 mg, atorvastatin 40 mg - Consult [facility] for further recommendations

## 2022-12-01 NOTE — ED Notes (Addendum)
Pt unable to void we will try again later.

## 2022-12-01 NOTE — Assessment & Plan Note (Signed)
Stable on RA. Chest x-ray without acute cardiopulmonary findings.  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 Repeat echo this admission with improvement of EF to 70-75% with normal diastolic parameters. BNP unimpressive at 147. Consider age-related deconditioning as contributor. - Monitor clinically, continue to work with PT while inpt

## 2022-12-01 NOTE — ED Provider Notes (Signed)
Care of patient received from prior provider at 4:27 PM, please see their note for complete H/P and care plan.  Received handoff per ED course.  Clinical Course as of 12/01/22 1627  Sat Dec 01, 2022  1510 Stable 16 YOM with  a chief complaint of leg redness and swelling Negative DVT study Was on Doxyx3 days with intolerance Clindax4 days without improvement Fever 101.4 today Pharmacy consulted and started on rocephin [CC]  1558 Reevaluated at bedside, substantial BL LE swelling Erethyma to Right ankle with extensive soft tissue swelling  [CC]  1625 DG Ankle 2 Views Right [CC]  1626 Consulted family medicine who agrees with admission. [CC]    Clinical Course User Index [CC] Glyn Ade, MD     Glyn Ade, MD 12/01/22 [id number]

## 2022-12-01 NOTE — Assessment & Plan Note (Addendum)
Stable on home regimen. -Continue amlodipine 10mg  and lisinopril 5mg  BID

## 2022-12-01 NOTE — Assessment & Plan Note (Addendum)
Suspect failure of outpt abx is related to poor penetration 2/2 likely PAD. Border of the cellulitis has retracted from the borders marked yesterday. Appears we are making progress.  - Continue IV ceftriaxone - F/u MRSA swab  - PT eval and treat  - fall precautions, delirium precautions

## 2022-12-01 NOTE — ED Notes (Signed)
Admission team at bedside.

## 2022-12-01 NOTE — ED Notes (Signed)
Pt transported to xray 

## 2022-12-01 NOTE — ED Triage Notes (Signed)
Pt came in via POV d/t Rt leg pain that began  2 weeks ago. Pt is being Tx for cellulitis in his lower leg/ankle with no improvement so far. Has had fevers & worsening s/s. A/Ox4.

## 2022-12-01 NOTE — ED Notes (Signed)
ED TO INPATIENT HANDOFF REPORT  ED Nurse Name and Phone #: Donnella Bi [phone]  S Name/Age/Gender Dean Mayer 82 y.o. male Room/Bed: H018C/H018C  Code Status   Code Status: DNR  Home/SNF/Other Home Patient oriented to: self, place, time, and situation Is this baseline? Yes   Triage Complete: Triage complete  Chief Complaint Cellulitis [L03.90]  Triage Note Pt came in via POV d/t Rt leg pain that began  2 weeks ago. Pt is being Tx for cellulitis in his lower leg/ankle with no improvement so far. Has had fevers & worsening s/s. A/Ox4.    Allergies No Known Allergies  Level of Care/Admitting Diagnosis ED Disposition     ED Disposition  Admit   Condition  --   Comment  Hospital Area: [facility] [[id number]]  Level of Care: Med-Surg [16]  May place patient in observation at [facility] or [facility] if equivalent level of care is available:: No  Covid Evaluation: Asymptomatic - no recent exposure (last 10 days) testing not required  Diagnosis: Cellulitis [454098]  Admitting Physician: Lockie Mola [[id number]]  Attending Physician: Nestor Ramp [[id number]]          B Medical/Surgery History Past Medical History:  Diagnosis Date   Eye abnormalities    GERD (gastroesophageal reflux disease)    Hypertension    Kidney stones    Throat cancer [facility])    Past Surgical History:  Procedure Laterality Date   EYE SURGERY     TONSILLECTOMY       A IV Location/Drains/Wounds Patient Lines/Drains/Airways Status     Active Line/Drains/Airways     Name Placement date Placement time Site Days   Peripheral IV 12/01/22 20 G Right Antecubital 12/01/22  1330  Antecubital  less than 1            Intake/Output Last 24 hours No intake or output data in the 24 hours ending 12/01/22 1746  Labs/Imaging Results for orders placed or performed during the hospital encounter of 12/01/22 (from the past 48 hour(s))  Lactic acid, plasma     Status: None    Collection Time: 12/01/22 11:40 AM  Result Value Ref Range   Lactic Acid, Venous 0.9 0.5 - 1.9 mmol/L    Comment: Performed at [facility], [address], [place], [place] [address]  Comprehensive metabolic panel     Status: Abnormal   Collection Time: 12/01/22 11:40 AM  Result Value Ref Range   Sodium 134 (L) 135 - 145 mmol/L   Potassium 3.9 3.5 - 5.1 mmol/L   Chloride 100 98 - 111 mmol/L   CO2 25 22 - 32 mmol/L   Glucose, Bld 117 (H) 70 - 99 mg/dL    Comment: Glucose reference range applies only to samples taken after fasting for at least 8 hours.   BUN 13 8 - 23 mg/dL   Creatinine, Ser 1.30 0.61 - 1.24 mg/dL   Calcium 8.3 (L) 8.9 - 10.3 mg/dL   Total Protein 5.7 (L) 6.5 - 8.1 g/dL   Albumin 2.3 (L) 3.5 - 5.0 g/dL   AST 35 15 - 41 U/L   ALT 34 0 - 44 U/L   Alkaline Phosphatase 109 38 - 126 U/L   Total Bilirubin 1.2 0.3 - 1.2 mg/dL   GFR, Estimated >86 >57 mL/min    Comment: (NOTE) Calculated using the CKD-EPI Creatinine Equation (2021)    Anion gap 9 5 - 15    Comment: Performed at [facility] Lab, [address] [place], [place] [address]  CBC with Differential     Status: Abnormal   Collection Time: 12/01/22 11:40 AM  Result Value Ref Range   WBC 6.6 4.0 - 10.5 K/uL   RBC 4.18 (L) 4.22 - 5.81 MIL/uL   Hemoglobin 12.9 (L) 13.0 - 17.0 g/dL   HCT 60.4 (L) 54.0 - 98.1 %   MCV 90.7 80.0 - 100.0 fL   MCH 30.9 26.0 - 34.0 pg   MCHC 34.0 30.0 - 36.0 g/dL   RDW 19.1 47.8 - 29.5 %   Platelets 174 150 - 400 K/uL   nRBC 0.0 0.0 - 0.2 %   Neutrophils Relative % 80 %   Neutro Abs 5.3 1.7 - 7.7 K/uL   Lymphocytes Relative 11 %   Lymphs Abs 0.8 0.7 - 4.0 K/uL   Monocytes Relative 7 %   Monocytes Absolute 0.4 0.1 - 1.0 K/uL   Eosinophils Relative 1 %   Eosinophils Absolute 0.1 0.0 - 0.5 K/uL   Basophils Relative 0 %   Basophils Absolute 0.0 0.0 - 0.1 K/uL   Immature Granulocytes 1 %   Abs Immature Granulocytes 0.04 0.00 - 0.07 K/uL    Comment: Performed at [facility], [address] [place], [place] [address]  Lactic acid, plasma     Status: None   Collection Time: 12/01/22  1:08 PM  Result Value Ref Range   Lactic Acid, Venous 1.0 0.5 - 1.9 mmol/L    Comment: Performed at [facility], [address] [place], [place] [address]  TSH     Status: None   Collection Time: 12/01/22  1:09 PM  Result Value Ref Range   TSH 3.583 0.350 - 4.500 uIU/mL    Comment: Performed by a 3rd Generation assay with a functional sensitivity of <=0.01 uIU/mL. Performed at [facility], [address] [place], [place] [address]   Urinalysis, Routine w reflex microscopic -Urine, Clean Catch     Status: None   Collection Time: 12/01/22  1:45 PM  Result Value Ref Range   Color, Urine YELLOW YELLOW   APPearance CLEAR CLEAR   Specific Gravity, Urine 1.013 1.005 - 1.030   pH 5.0 5.0 - 8.0   Glucose, UA NEGATIVE NEGATIVE mg/dL   Hgb urine dipstick NEGATIVE NEGATIVE   Bilirubin Urine NEGATIVE NEGATIVE   Ketones, ur NEGATIVE NEGATIVE mg/dL   Protein, ur NEGATIVE NEGATIVE mg/dL   Nitrite NEGATIVE NEGATIVE   Leukocytes,Ua NEGATIVE NEGATIVE    Comment: Performed at [facility], [address], [place], [place] [address]   DG Ankle 2 Views Right  Result Date: 12/01/2022 CLINICAL DATA:  Edema and cellulitis EXAM: RIGHT ANKLE - 2 VIEW COMPARISON:  None Available. FINDINGS: Vascular calcifications noted. Soft tissue edema and swelling. No fracture or dislocation. No bony erosion. No other abnormality. No soft tissue gas identified. IMPRESSION: Soft tissue edema and swelling. No other acute abnormalities. No soft tissue gas identified. Electronically Signed   By: Gerome Sam III M.D.   On: 12/01/2022 13:42   DG Chest 2 View  Result Date: 12/01/2022 CLINICAL DATA:  Infection. Right leg pain. History of cellulitis in lower leg/ankle with no improvement. EXAM: CHEST - 2 VIEW COMPARISON:  Chest x-ray April 10, 2019. FINDINGS: The study is limited  due to low volumes and lordotic positioning. No pneumothorax identified. Stable cardiomegaly. The hila and mediastinum are unchanged. Mild atelectasis in the left base. No  suspicious infiltrate identified. IMPRESSION: Limited study as above without acute abnormality to explain signs of infection. Electronically Signed   By: Gerome Sam III M.D.   On: 12/01/2022 13:41   VAS [place] LOWER EXTREMITY VENOUS (DVT) (7a-7p)  Result Date: 12/01/2022  Lower Venous DVT Study Patient Name:  Dean Mayer  Date of Exam:   12/01/2022 Medical Rec #: [id number]          Accession #:    [id number] Date of Birth: 15-Sep-1940          Patient Gender: M Patient Age:   70 years Exam Location:  [facility] Procedure:      VAS [place] LOWER EXTREMITY VENOUS (DVT) Referring Phys: HALEY SAGE --------------------------------------------------------------------------------  Indications: Pain, Swelling, and Erythema. Other Indications: Patient diagnosed with cellulitis on 11/28/22. Comparison Study: Previous exam on 11/28/22 was negative for DVT Performing Technologist: Ernestene Mention RVT, RDMS  Examination Guidelines: A complete evaluation includes B-mode imaging, spectral Doppler, color Doppler, and power Doppler as needed of all accessible portions of each vessel. Bilateral testing is considered an integral part of a complete examination. Limited examinations for reoccurring indications may be performed as noted. The reflux portion of the exam is performed with the patient in reverse Trendelenburg.  +---------+---------------+---------+-----------+----------+--------------+ RIGHT    CompressibilityPhasicitySpontaneityPropertiesThrombus Aging +---------+---------------+---------+-----------+----------+--------------+ CFV      Full           Yes      Yes                                 +---------+---------------+---------+-----------+----------+--------------+ SFJ      Full                                                         +---------+---------------+---------+-----------+----------+--------------+ FV Prox  Full           Yes      Yes                                 +---------+---------------+---------+-----------+----------+--------------+ FV Mid   Full           Yes      Yes                                 +---------+---------------+---------+-----------+----------+--------------+ FV DistalFull           Yes      Yes                                 +---------+---------------+---------+-----------+----------+--------------+ PFV      Full                                                        +---------+---------------+---------+-----------+----------+--------------+ POP      Full           Yes      Yes                                 +---------+---------------+---------+-----------+----------+--------------+  PTV      Full                                                        +---------+---------------+---------+-----------+----------+--------------+ PERO     Full                                                        +---------+---------------+---------+-----------+----------+--------------+     Summary: RIGHT: - There is no evidence of deep vein thrombosis in the lower extremity.  - No cystic structure found in the popliteal fossa. Subcutaneous edema in area of calf and ankle.  LEFT: - No evidence of common femoral vein obstruction.  *See table(s) above for measurements and observations. Electronically signed by Heath Lark on 12/01/2022 at 1:30:17 PM.    Final     Pending Labs Unresulted Labs (From admission, onward)     Start     Ordered   12/02/22 0500  Basic metabolic panel  Tomorrow morning,   R        12/01/22 1717   12/02/22 0500  CBC  Tomorrow morning,   R        12/01/22 1717   12/01/22 1620  MRSA Next Gen by PCR, Nasal  Once,   URGENT        12/01/22 1619   12/01/22 1309  Brain natriuretic peptide  Once,   URGENT        12/01/22 1308   12/01/22 1309   Blood culture (routine x 2)  BLOOD CULTURE X 2,   R (with STAT occurrences)      12/01/22 1308            Vitals/Pain Today's Vitals   12/01/22 1136 12/01/22 1352 12/01/22 1421 12/01/22 1625  BP:    (!) 176/78  Pulse:    88  Resp:    18  Temp:    97.6 F (36.4 C)  TempSrc:      SpO2:    98%  Weight:  116.1 kg    Height:  6\' 4"  (1.93 m)    PainSc: 10-Worst pain ever  3  7     Isolation Precautions No active isolations  Medications Medications  acetaminophen (TYLENOL) tablet 1,000 mg (1,000 mg Oral Patient Refused/Not Given 12/01/22 1627)  amLODipine (NORVASC) tablet 10 mg (has no administration in time range)  atorvastatin (LIPITOR) tablet 40 mg (has no administration in time range)  lisinopril (ZESTRIL) tablet 5 mg (has no administration in time range)  levothyroxine (SYNTHROID) tablet 100 mcg (has no administration in time range)  tamsulosin (FLOMAX) capsule 0.4 mg (has no administration in time range)  brimonidine (ALPHAGAN) 0.2 % ophthalmic solution 1 drop (has no administration in time range)  enoxaparin (LOVENOX) injection 50 mg (has no administration in time range)  acetaminophen (TYLENOL) tablet 650 mg (has no administration in time range)    Or  acetaminophen (TYLENOL) suppository 650 mg (has no administration in time range)  oxyCODONE (Oxy IR/ROXICODONE) immediate release tablet 5 mg (has no administration in time range)  senna (SENOKOT) tablet 8.6 mg (has no administration in time range)  polyethylene glycol (MIRALAX / GLYCOLAX) packet 17  g (has no administration in time range)  multivitamin with minerals tablet 1 tablet (has no administration in time range)  aspirin EC tablet 325 mg (has no administration in time range)  morphine (PF) 4 MG/ML injection 4 mg (4 mg Intravenous Given 12/01/22 1345)  cefTRIAXone (ROCEPHIN) 2 g in sodium chloride 0.9 % 100 mL IVPB (0 g Intravenous Stopped 12/01/22 1456)  lisinopril (ZESTRIL) tablet 5 mg (5 mg Oral Given 12/01/22 1635)   amLODipine (NORVASC) tablet 10 mg (10 mg Oral Given 12/01/22 1635)    Mobility walks     Focused Assessments Cellulitis RLE   R Recommendations: See Admitting Provider Note  Report given to:   Additional Notes: .

## 2022-12-01 NOTE — Progress Notes (Signed)
FMTS Brief Progress Note  S: Patient notes his pain is tolerable at this time.  He was able to fall asleep prior to the encounter. Does not have any acute concerns or complaints at this time.  O: BP (!) 179/68 (BP Location: Left Arm)   Pulse 81   Temp 98 F (36.7 C) (Oral)   Resp 14   Ht 6\' 4"  (1.93 m)   Wt 116.1 kg   SpO2 97%   BMI 31.16 kg/m   General: Well-appearing, NAD CV: RRR, no murmurs auscultated, radial pulses 2+ Pulm: Normal WOB, CTAB Extremities: Radial pulses 2+, unable to appreciate pedal, popliteal, femoral pulses bilaterally, sensation intact, 3+ pitting edema of right lower extremity up to knee, 2+ pitting edema of left lower extremity up to knee  A/P: Cellulitis of right leg Continue plans per day team. Pain is well-controlled.  Would benefit from ABIs as already ordered. - Orders reviewed. Labs for AM ordered, which was adjusted as needed.   Shelby Mattocks, DO 12/01/2022, 9:08 PM PGY-2, [facility] Night Resident  Please page [phone] with questions.

## 2022-12-01 NOTE — ED Provider Notes (Signed)
[place] EMERGENCY DEPARTMENT AT [facility] Provider Note   CSN: [id number] Arrival date & time: 12/01/22  1111     History  Chief Complaint  Patient presents with   Rt Leg Pain    Dean Mayer is a 82 y.o. male.  The history is provided by the patient and medical records. No language interpreter was used.  Leg Pain Location:  Ankle Time since incident:  2 weeks Injury: no   Ankle location:  R ankle Pain details:    Quality:  Aching   Timing:  Constant   Progression:  Worsening Chronicity:  New Ineffective treatments: antibiotics. Associated symptoms: fever and swelling   Associated symptoms: no back pain, no fatigue, no itching, no muscle weakness, no neck pain, no numbness and no tingling        Home Medications Prior to Admission medications   Medication Sig Start Date End Date Taking? Authorizing Provider  amLODipine (NORVASC) 10 MG tablet Take 10 mg by mouth daily.    [provider]  bimatoprost (LUMIGAN) 0.03 % ophthalmic solution Place 1 drop into the left eye 2 (two) times daily.    [provider]  ciprofloxacin (CIPRO) 500 MG tablet Take 1 tablet (500 mg total) by mouth 2 (two) times daily. 04/10/19   Linwood Dibbles, MD  dexamethasone (DECADRON) 4 MG tablet Take 4 mg by mouth 2 (two) times daily with a meal. Takes week of chemo then 3 days following    [provider]  docusate sodium (COLACE) 100 MG capsule Take 100 mg by mouth 2 (two) times daily as needed.    [provider]  doxycycline (VIBRAMYCIN) 100 MG capsule Take 1 capsule (100 mg total) by mouth 2 (two) times daily. One po bid x 7 days 11/27/22   Melene Plan, DO  famotidine (PEPCID) 20 MG tablet Take 20 mg by mouth 2 (two) times daily.    [provider]  fluticasone (FLONASE) 50 MCG/ACT nasal spray Place 2 sprays into the nose daily as needed.    [provider]  losartan (COZAAR) 100 MG tablet Take 100 mg by mouth daily.    [provider]  polyethylene glycol (MIRALAX / GLYCOLAX) packet Take 17 g by mouth daily as needed.    [provider]  predniSONE (DELTASONE) 50 MG tablet 1 tab daily x 7 days 03/27/13   Floydene Flock, MD  valACYclovir (VALTREX) 1000 MG tablet Take 1 tablet (1,000 mg total) by mouth 3 (three) times daily. 03/27/13   Floydene Flock, MD      Allergies    Patient has no known allergies.    Review of Systems   Review of Systems  Constitutional:  Positive for chills and fever. Negative for fatigue.  HENT:  Negative for congestion.   Respiratory:  Positive for shortness of breath. Negative for cough, chest tightness and wheezing.   Cardiovascular:  Positive for leg swelling. Negative for chest pain and palpitations.  Gastrointestinal:  Negative for abdominal pain, constipation, diarrhea and nausea.  Genitourinary:  Negative for dysuria and flank pain.  Musculoskeletal:  Negative for back pain, neck pain and neck stiffness.  Skin:  Positive for color change and rash. Negative for itching.  Neurological:  Negative for light-headedness and headaches.  Psychiatric/Behavioral:  Negative for agitation.   All other systems reviewed and are negative.   Physical Exam Updated Vital Signs BP (!) 156/60 (BP Location: Right Arm)   Pulse 79   Temp  97.8 F (36.6 C) (Oral)   Resp 19   SpO2 95%  Physical Exam Vitals and nursing note reviewed.  Constitutional:      General: He is not in acute distress.    Appearance: He is well-developed. He is not ill-appearing, toxic-appearing or diaphoretic.  HENT:     Head: Normocephalic and atraumatic.     Nose: No congestion or rhinorrhea.     Mouth/Throat:     Mouth: Mucous membranes are moist.     Pharynx: No oropharyngeal exudate or posterior oropharyngeal erythema.  Eyes:     Conjunctiva/sclera: Conjunctivae normal.  Cardiovascular:     Rate and Rhythm: Normal rate and regular rhythm.     Heart sounds: No murmur heard. Pulmonary:      Effort: Pulmonary effort is normal. No respiratory distress.     Breath sounds: Rales present. No wheezing or rhonchi.  Chest:     Chest wall: No tenderness.  Abdominal:     Palpations: Abdomen is soft.     Tenderness: There is no abdominal tenderness. There is no guarding or rebound.  Musculoskeletal:        General: Swelling and tenderness present.     Cervical back: Neck supple.     Right lower leg: Edema present.     Left lower leg: Edema present.  Skin:    General: Skin is warm and dry.     Capillary Refill: Capillary refill takes less than 2 seconds.     Findings: Erythema present.  Neurological:     General: No focal deficit present.     Mental Status: He is alert.  Psychiatric:        Mood and Affect: Mood normal.     ED Results / Procedures / Treatments   Labs (all labs ordered are listed, but only abnormal results are displayed) Labs Reviewed  COMPREHENSIVE METABOLIC PANEL - Abnormal; Notable for the following components:      Result Value   Sodium 134 (*)    Glucose, Bld 117 (*)    Calcium 8.3 (*)    Total Protein 5.7 (*)    Albumin 2.3 (*)    All other components within normal limits  CBC WITH DIFFERENTIAL/PLATELET - Abnormal; Notable for the following components:   RBC 4.18 (*)    Hemoglobin 12.9 (*)    HCT 37.9 (*)    All other components within normal limits  CULTURE, BLOOD (ROUTINE X 2)  CULTURE, BLOOD (ROUTINE X 2)  LACTIC ACID, PLASMA  LACTIC ACID, PLASMA  URINALYSIS, ROUTINE W REFLEX MICROSCOPIC  TSH  BRAIN NATRIURETIC PEPTIDE    EKG EKG Interpretation  Date/Time:  Saturday December 01 2022 13:15:44 EDT Ventricular Rate:  69 PR Interval:  198 QRS Duration: 76 QT Interval:  366 QTC Calculation: 392 R Axis:   5 Text Interpretation: Normal sinus rhythm Normal ECG No previous ECGs available no prior ECG for comparison. No STEMI Confirmed by Theda Belfast ([id number]) on 12/01/2022 2:34:17 PM  Radiology DG Ankle 2 Views Right  Result Date:  12/01/2022 CLINICAL DATA:  Edema and cellulitis EXAM: RIGHT ANKLE - 2 VIEW COMPARISON:  None Available. FINDINGS: Vascular calcifications noted. Soft tissue edema and swelling. No fracture or dislocation. No bony erosion. No other abnormality. No soft tissue gas identified. IMPRESSION: Soft tissue edema and swelling. No other acute abnormalities. No soft tissue gas identified. Electronically Signed   By: Gerome Sam III M.D.   On: 12/01/2022 13:42   DG Chest 2 View  Result Date: 12/01/2022 CLINICAL DATA:  Infection. Right leg pain. History of cellulitis in lower leg/ankle with no improvement. EXAM: CHEST - 2 VIEW COMPARISON:  Chest x-ray April 10, 2019. FINDINGS: The study is limited due to low volumes and lordotic positioning. No pneumothorax identified. Stable cardiomegaly. The hila and mediastinum are unchanged. Mild atelectasis in the left base. No suspicious infiltrate identified. IMPRESSION: Limited study as above without acute abnormality to explain signs of infection. Electronically Signed   By: Gerome Sam III M.D.   On: 12/01/2022 13:41   VAS [place] LOWER EXTREMITY VENOUS (DVT) (7a-7p)  Result Date: 12/01/2022  Lower Venous DVT Study Patient Name:  Dean Mayer  Date of Exam:   12/01/2022 Medical Rec #: [id number]          Accession #:    [id number] Date of Birth: 1940/08/17          Patient Gender: M Patient Age:   32 years Exam Location:  [facility] Procedure:      VAS [place] LOWER EXTREMITY VENOUS (DVT) Referring Phys: HALEY SAGE --------------------------------------------------------------------------------  Indications: Pain, Swelling, and Erythema. Other Indications: Patient diagnosed with cellulitis on 11/28/22. Comparison Study: Previous exam on 11/28/22 was negative for DVT Performing Technologist: Ernestene Mention RVT, RDMS  Examination Guidelines: A complete evaluation includes B-mode imaging, spectral Doppler, color Doppler, and power Doppler as needed of all accessible portions  of each vessel. Bilateral testing is considered an integral part of a complete examination. Limited examinations for reoccurring indications may be performed as noted. The reflux portion of the exam is performed with the patient in reverse Trendelenburg.  +---------+---------------+---------+-----------+----------+--------------+ RIGHT    CompressibilityPhasicitySpontaneityPropertiesThrombus Aging +---------+---------------+---------+-----------+----------+--------------+ CFV      Full           Yes      Yes                                 +---------+---------------+---------+-----------+----------+--------------+ SFJ      Full                                                        +---------+---------------+---------+-----------+----------+--------------+ FV Prox  Full           Yes      Yes                                 +---------+---------------+---------+-----------+----------+--------------+ FV Mid   Full           Yes      Yes                                 +---------+---------------+---------+-----------+----------+--------------+ FV DistalFull           Yes      Yes                                 +---------+---------------+---------+-----------+----------+--------------+ PFV      Full                                                        +---------+---------------+---------+-----------+----------+--------------+  POP      Full           Yes      Yes                                 +---------+---------------+---------+-----------+----------+--------------+ PTV      Full                                                        +---------+---------------+---------+-----------+----------+--------------+ PERO     Full                                                        +---------+---------------+---------+-----------+----------+--------------+     Summary: RIGHT: - There is no evidence of deep vein thrombosis in the lower extremity.  - No  cystic structure found in the popliteal fossa. Subcutaneous edema in area of calf and ankle.  LEFT: - No evidence of common femoral vein obstruction.  *See table(s) above for measurements and observations. Electronically signed by Heath Lark on 12/01/2022 at 1:30:17 PM.    Final     Procedures Procedures    Medications Ordered in ED Medications  morphine (PF) 4 MG/ML injection 4 mg (4 mg Intravenous Given 12/01/22 1345)  cefTRIAXone (ROCEPHIN) 2 g in sodium chloride 0.9 % 100 mL IVPB (0 g Intravenous Stopped 12/01/22 1456)    ED Course/ Medical Decision Making/ A&P Clinical Course as of 12/01/22 1547  Sat Dec 01, 2022  1510 Stable 81 YOM with  a chief complaint of leg redness and swelling Negative DVT study Was on Doxyx3 days with intolerance Clindax4 days without improvement Fever 101.4 today Pharmacy consulted and started on rocephin [CC]    Clinical Course User Index [CC] Glyn Ade, MD                             Medical Decision Making Amount and/or Complexity of Data Reviewed Labs: ordered. Radiology: ordered.  Risk Prescription drug management. Decision regarding hospitalization.    Dean Mayer is a 82 y.o. male with a past medical history significant for GERD, hypertension, kidney stones, previous throat cancer, and recent cellulitis of his right leg who presents with worsening cellulitis despite antibiotics.  According to patient, for the last 2 weeks he had redness on his right ankle that has been spreading.  He was started on doxycycline but could not tolerate it from GI side effects.  He was placed to clindamycin which he has been on for the last few days but his redness keeps worsening and the swelling worsens.  He has also developed fevers over the last few days with a temperature of up to 100.9.  He is having some nausea and reports he is also having some exertional shortness of breath.  No chest pain reported.  No abdominal pain.  He denies any other  infectious symptoms such as cough.  Denies any constipation or diarrhea or urinary changes.  On exam, lungs clear.  Chest nontender.  Abdomen nontender.  Patient has edema in both legs but right  is much worse than left.  He does have warmth and erythema distally near his ankle and lower leg.  Intact sensation, strength, and pulses in lower extremities on my exam.  Patient reportedly had an ultrasound several days ago that was negative for DVT however with his worsening swelling we will get a repeat DVT ultrasound.  Ultrasound shows no DVT.  Clinically I am concerned about recurrent cellulitis that is failing outpatient antibiotics.  Will get screening labs, x-ray to rule out subcutaneous gas, and make sure he does not have other acute abnormalities.  Will get a BNP given his new edema as he does not history of heart failure in the chart.  Anticipate admission for failure of outpatient antibiotics for cellulitis.  2:15 PM Patient still waiting on BNP with x-ray returned showing no evidence of subcutaneous gas or bony involvement.  Soft tissue swelling seen.  Suspect worsening cellulitis that is failed the doxycycline and clindamycin at home.  Will start IV antibiotics and call for admission.  Pharmacy recommended Rocephin.  Will order and admit.        Final Clinical Impression(s) / ED Diagnoses Final diagnoses:  Cellulitis of right lower extremity  Peripheral edema    Clinical Impression: 1. Cellulitis of right lower extremity   2. Peripheral edema     Disposition: Admit  This note was prepared with assistance of Dragon voice recognition software. Occasional wrong-word or sound-a-like substitutions may have occurred due to the inherent limitations of voice recognition software.     Ceci Taliaferro, Canary Brim, MD 12/01/22 [phone]

## 2022-12-01 NOTE — ED Notes (Signed)
Pt to vascular.

## 2022-12-01 NOTE — Progress Notes (Signed)
RLE venous duplex has been completed.  Preliminary results given to [facility], PA-C.    Results can be found under chart review under CV PROC. 12/01/2022 12:47 PM Rebbecca Osuna RVT, RDMS

## 2022-12-02 ENCOUNTER — Inpatient Hospital Stay (HOSPITAL_COMMUNITY): Payer: Medicare HMO

## 2022-12-02 DIAGNOSIS — I10 Essential (primary) hypertension: Secondary | ICD-10-CM

## 2022-12-02 DIAGNOSIS — Z66 Do not resuscitate: Secondary | ICD-10-CM | POA: Diagnosis present

## 2022-12-02 DIAGNOSIS — I739 Peripheral vascular disease, unspecified: Secondary | ICD-10-CM | POA: Diagnosis present

## 2022-12-02 DIAGNOSIS — Z8249 Family history of ischemic heart disease and other diseases of the circulatory system: Secondary | ICD-10-CM | POA: Diagnosis not present

## 2022-12-02 DIAGNOSIS — Z8546 Personal history of malignant neoplasm of prostate: Secondary | ICD-10-CM | POA: Diagnosis not present

## 2022-12-02 DIAGNOSIS — Z79890 Hormone replacement therapy (postmenopausal): Secondary | ICD-10-CM | POA: Diagnosis not present

## 2022-12-02 DIAGNOSIS — Z9221 Personal history of antineoplastic chemotherapy: Secondary | ICD-10-CM | POA: Diagnosis not present

## 2022-12-02 DIAGNOSIS — R209 Unspecified disturbances of skin sensation: Secondary | ICD-10-CM | POA: Diagnosis not present

## 2022-12-02 DIAGNOSIS — L03115 Cellulitis of right lower limb: Principal | ICD-10-CM

## 2022-12-02 DIAGNOSIS — R600 Localized edema: Secondary | ICD-10-CM | POA: Diagnosis not present

## 2022-12-02 DIAGNOSIS — Z7982 Long term (current) use of aspirin: Secondary | ICD-10-CM | POA: Diagnosis not present

## 2022-12-02 DIAGNOSIS — Z87442 Personal history of urinary calculi: Secondary | ICD-10-CM | POA: Diagnosis not present

## 2022-12-02 DIAGNOSIS — L0390 Cellulitis, unspecified: Secondary | ICD-10-CM | POA: Diagnosis present

## 2022-12-02 DIAGNOSIS — Z87891 Personal history of nicotine dependence: Secondary | ICD-10-CM | POA: Diagnosis not present

## 2022-12-02 DIAGNOSIS — Z923 Personal history of irradiation: Secondary | ICD-10-CM | POA: Diagnosis not present

## 2022-12-02 DIAGNOSIS — Z85819 Personal history of malignant neoplasm of unspecified site of lip, oral cavity, and pharynx: Secondary | ICD-10-CM | POA: Diagnosis not present

## 2022-12-02 DIAGNOSIS — Z79899 Other long term (current) drug therapy: Secondary | ICD-10-CM | POA: Diagnosis not present

## 2022-12-02 DIAGNOSIS — Z8673 Personal history of transient ischemic attack (TIA), and cerebral infarction without residual deficits: Secondary | ICD-10-CM | POA: Diagnosis not present

## 2022-12-02 DIAGNOSIS — R0609 Other forms of dyspnea: Secondary | ICD-10-CM

## 2022-12-02 DIAGNOSIS — E785 Hyperlipidemia, unspecified: Secondary | ICD-10-CM | POA: Diagnosis present

## 2022-12-02 DIAGNOSIS — E039 Hypothyroidism, unspecified: Secondary | ICD-10-CM | POA: Diagnosis present

## 2022-12-02 LAB — ECHOCARDIOGRAM COMPLETE
AR max vel: 3.12 cm2
AV Area VTI: 2.57 cm2
AV Area mean vel: 3.05 cm2
AV Mean grad: 6 mmHg
AV Peak grad: 10.2 mmHg
Ao pk vel: 1.6 m/s
Area-P 1/2: 1.73 cm2
Calc EF: 76.1 %
Height: 76 in
MV VTI: 3.11 cm2
Single Plane A2C EF: 77 %
Single Plane A4C EF: 74.2 %
Weight: 4096 oz

## 2022-12-02 LAB — BASIC METABOLIC PANEL
Anion gap: 8 (ref 5–15)
BUN: 11 mg/dL (ref 8–23)
CO2: 24 mmol/L (ref 22–32)
Calcium: 8.6 mg/dL — ABNORMAL LOW (ref 8.9–10.3)
Chloride: 104 mmol/L (ref 98–111)
Creatinine, Ser: 1.08 mg/dL (ref 0.61–1.24)
GFR, Estimated: >60 mL/min (ref >60)
Glucose, Bld: 114 mg/dL — ABNORMAL HIGH (ref 70–99)
Potassium: 4 mmol/L (ref 3.5–5.1)
Sodium: 136 mmol/L (ref 135–145)

## 2022-12-02 LAB — CBC
HCT: 37.3 % — ABNORMAL LOW (ref 39.0–52.0)
Hemoglobin: 12.7 g/dL — ABNORMAL LOW (ref 13.0–17.0)
MCH: 30.8 pg (ref 26.0–34.0)
MCHC: 34 g/dL (ref 30.0–36.0)
MCV: 90.3 fL (ref 80.0–100.0)
Platelets: 143 10*3/uL — ABNORMAL LOW (ref 150–400)
RBC: 4.13 MIL/uL — ABNORMAL LOW (ref 4.22–5.81)
RDW: 12.9 % (ref 11.5–15.5)
WBC: 5.9 10*3/uL (ref 4.0–10.5)
nRBC: 0 % (ref 0.0–0.2)

## 2022-12-02 LAB — CULTURE, BLOOD (ROUTINE X 2): Culture: NO GROWTH

## 2022-12-02 MED ORDER — PERFLUTREN LIPID MICROSPHERE
1.0000 mL | INTRAVENOUS | Status: AC | PRN
  Administered 2022-12-02: 3 mL via INTRAVENOUS

## 2022-12-02 MED ORDER — ENOXAPARIN SODIUM 60 MG/0.6ML IJ SOSY
60.0000 mg | PREFILLED_SYRINGE | INTRAMUSCULAR | Status: DC
  Administered 2022-12-02 – 2022-12-04 (×3): 60 mg via SUBCUTANEOUS
  Filled 2022-12-02 (×3): qty 0.6

## 2022-12-02 NOTE — Progress Notes (Signed)
     Daily Progress Note Intern Pager: [phone]  Patient name: Dean Mayer Medical record number: [id number] Date of birth: March 06, 1941 Age: 82 y.o. Gender: male  Primary Care Provider: Cordial, Harlan Stains, MD Consultants: None Code Status: DNR  Pt Overview and Major Events to Date:  4/27- admitted  Assessment and Plan: Dean Mayer is a 82yo male admitted for treatment of cellulitis refractory to outpatient doxycycline/clindamycin. 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   * Cellulitis of right leg Suspect failure of outpt abx is related to poor penetration 2/2 likely PAD. Border of the cellulitis has retracted from the borders marked yesterday. Appears we are making progress.  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  Concern for possible PAD.  Has had bilateral ABI in the past in 2022 without evidence of PAD at that time.   - ABIs ordered, anticipate this may not happen until tomorrow -Continue home ASA 325 mg, atorvastatin 40 mg - Continue to monitor  Dyspnea on exertion Chest x-ray without acute cardiopulmonary findings.  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 Minimal concern for CHF contributing here, though the pitting edema associated with his cellulitis is impressive.  - BNP unimpressive at 147 - Follow-up echocardiogram - Strict ins and outs - Follow weights  Hypertension BP acceptable range on home regimen. - Continue amlodipine and lisinopril     FEN/GI: Regular PPx: Lovenox Dispo:Home pending clinical improvement . Barriers include need to see further improvement before transition to PO.   Subjective:  Tells me he feels well this morning. Pain is manageable. Good appetite. Wife is bringing in breakfast.   Objective: Temp:   [97.5 F (36.4 C)-99 F (37.2 C)] 97.9 F (36.6 C) (04/28 0743) Pulse Rate:  [61-88] 64 (04/28 0743) Resp:  [14-19] 18 (04/28 0743) BP: (128-179)/(58-78) 140/58 (04/28 0743) SpO2:  [93 %-98 %] 97 % (04/28 0743) Weight:  [116.1 kg] 116.1 kg (04/27 1352) Physical Exam: General: Well-appearing, NAD Cardiovascular: RRR, no m/r/g Respiratory: Normal WOB on RA Abdomen: Non-tender, non-distended Extremities: RLE cellulitis, borders have retracted from outline drawn yesterday. Cannot palpate PT/DP pulses bilaterally, but neurologically intact.   Laboratory: Most recent CBC Lab Results  Component Value Date   WBC 5.9 12/02/2022   HGB 12.7 (L) 12/02/2022   HCT 37.3 (L) 12/02/2022   MCV 90.3 12/02/2022   PLT 143 (L) 12/02/2022   Most recent BMP    Latest Ref Rng & Units 12/02/2022    1:31 AM  BMP  Glucose 70 - 99 mg/dL 629   BUN 8 - 23 mg/dL 11   Creatinine 5.28 - 1.24 mg/dL 4.13   Sodium 244 - 010 mmol/L 136   Potassium 3.5 - 5.1 mmol/L 4.0   Chloride 98 - 111 mmol/L 104   CO2 22 - 32 mmol/L 24   Calcium 8.9 - 10.3 mg/dL 8.6      Imaging/Diagnostic Tests: No new imaging, tests  Alicia Amel, MD 12/02/2022, 9:20 AM  PGY-2, [facility] FPTS Intern pager: [phone], text pages welcome Secure chat group [facility] Teaching Service

## 2022-12-02 NOTE — Evaluation (Signed)
Occupational Therapy Evaluation Patient Details Name: Dean Mayer MRN: [id number] DOB: [birth year] Today's Date: 12/02/2022   History of Present Illness Pt presenting 4/27 with RLE pain that began 2 weeks ago. Has been receiving outpatient antibiotic treatment for cellulitis. 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   Clinical Impression   PTA, pt independent and lived with his wife. Upon eval, pt with RLE pain and decr mobility affecting balance, posture, and need for DME during mobility. Pt performing ADL with up to min guard A, suspect pt to progress well with medical management. Educated regarding optimization of healing by reducing wear of tight socks and ROM of LE as he can tolerate. Additionally reviewing compensatory techniques for ADL to reduce pain experience. Recommending PT eval and pt to mobilize acutely with mobility specialist. No further acute OT needs identified. OT to sign off. Thank you for this order.      Recommendations for follow up therapy are one component of a multi-disciplinary discharge planning process, led by the attending physician.  Recommendations may be updated based on patient status, additional functional criteria and insurance authorization.   Assistance Recommended at Discharge PRN  Patient can return home with the following A little help with walking and/or transfers;A little help with bathing/dressing/bathroom;Help with stairs or ramp for entrance;Assist for transportation;Assistance with cooking/housework    Functional Status Assessment  Patient has had a recent decline in their functional status and demonstrates the ability to make significant improvements in function in a reasonable and predictable amount of time.  Equipment Recommendations  Other (comment) (RW)    Recommendations for Other Services PT consult     Precautions / Restrictions  Precautions Precautions: Fall      Mobility Bed Mobility Overal bed mobility: Modified Independent                  Transfers Overall transfer level: Needs assistance Equipment used: Rolling walker (2 wheels) Transfers: Sit to/from Stand Sit to Stand: Supervision           General transfer comment: for safety      Balance Overall balance assessment: Mild deficits observed, not formally tested (poor posture OOB with guarding and pain)                                         ADL either performed or assessed with clinical judgement   ADL Overall ADL's : Needs assistance/impaired Eating/Feeding: Independent;Sitting   Grooming: Wash/dry hands;Wash/dry face;Oral care;Supervision/safety;Standing Grooming Details (indicate cue type and reason): Mod propping on sink, poor posture with most of weight transferred to LLE due to pain in RLE. Will reposition when cued but does not sustain Upper Body Bathing: Set up;Sitting   Lower Body Bathing: Set up;Sit to/from stand   Upper Body Dressing : Set up;Sitting   Lower Body Dressing: Min guard;Sit to/from stand   Toilet Transfer: Supervision/safety;Rolling walker (2 wheels);Ambulation Toilet Transfer Details (indicate cue type and reason): Pt with poor posture, additional flexion at hips and trunk in effort to reduce pain Toileting- Clothing Manipulation and Hygiene: Modified independent;Sitting/lateral lean   Tub/ Shower Transfer: Walk-in shower;Min guard;Ambulation   Functional mobility during ADLs: Supervision/safety;Rolling walker (2 wheels)       Vision Baseline Vision/History: 0 No visual deficits Ability to See in Adequate Light: 0 Adequate Patient Visual Report: No change  from baseline Vision Assessment?: No apparent visual deficits     Perception Perception Perception Tested?: No   Praxis Praxis Praxis tested?: Not tested    Pertinent Vitals/Pain Pain Assessment Pain Assessment:  Faces Faces Pain Scale: Hurts little more Pain Location: RLE Pain Descriptors / Indicators: Tender Pain Intervention(s): Limited activity within patient's tolerance, Monitored during session     Hand Dominance     Extremity/Trunk Assessment Upper Extremity Assessment Upper Extremity Assessment: Overall WFL for tasks assessed   Lower Extremity Assessment Lower Extremity Assessment: Defer to PT evaluation       Communication Communication Communication: No difficulties   Cognition Arousal/Alertness: Awake/alert Behavior During Therapy: WFL for tasks assessed/performed Overall Cognitive Status: Within Functional Limits for tasks assessed                                 General Comments: Pt reports his thinking feels a littlte off due to pain and making him hesitant to move. Otherwise, suspect pt to be near baseline.     General Comments       Exercises     Shoulder Instructions      Home Living Family/patient expects to be discharged to:: Private residence Living Arrangements: Spouse/significant other   Type of Home: House Home Access: Stairs to enter Secretary/administrator of Steps: 3 Entrance Stairs-Rails: Right Home Layout: One level     Bathroom Shower/Tub: Producer, television/film/video: Standard     Home Equipment: Shower seat          Prior Functioning/Environment Prior Level of Function : Independent/Modified Independent;Driving             Mobility Comments: no AD ADLs Comments: "very independent.        OT Problem List: Decreased strength;Impaired balance (sitting and/or standing);Decreased activity tolerance;Decreased knowledge of use of DME or AE      OT Treatment/Interventions:      OT Goals(Current goals can be found in the care plan section) Acute Rehab OT Goals Patient Stated Goal: get better OT Goal Formulation: With patient  OT Frequency:      Co-evaluation              AM-PAC OT "6 Clicks" Daily  Activity     Outcome Measure Help from another person eating meals?: None Help from another person taking care of personal grooming?: A Little Help from another person toileting, which includes using toliet, bedpan, or urinal?: A Little Help from another person bathing (including washing, rinsing, drying)?: A Little Help from another person to put on and taking off regular upper body clothing?: A Little Help from another person to put on and taking off regular lower body clothing?: A Little 6 Click Score: 19   End of Session Equipment Utilized During Treatment: Rolling walker (2 wheels) Nurse Communication: Mobility status  Activity Tolerance: Patient tolerated treatment well Patient left: in chair;with call bell/phone within reach;Other (comment) (able to verbalize he has been asked to use call bell prior to any transfer attempts)  OT Visit Diagnosis: Unsteadiness on feet (R26.81);Muscle weakness (generalized) (M62.81);Other abnormalities of gait and mobility (R26.89)                Time: 1610-9604 OT Time Calculation (min): 28 min Charges:  OT General Charges $OT Visit: 1 Visit OT Evaluation $OT Eval Low Complexity: 1 Low OT Treatments $Self Care/Home Management : 8-22 mins  Myrla Halsted, OTD,  OTR/L [facility] Acute Rehabilitation Office: [phone]   Myrla Halsted 12/02/2022, 10:14 AM

## 2022-12-02 NOTE — Evaluation (Signed)
Physical Therapy Evaluation Patient Details Name: Dean Mayer MRN: [id number] DOB: 06/24/1941 Today's Date: 12/02/2022  History of Present Illness  Pt presenting 4/27 with RLE pain that began 2 weeks ago. Has been receiving outpatient antibiotic treatment for cellulitis. 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   Clinical Impression  Pt OOB in recliner upon arrival of PT, agreeable to evaluation at this time. Prior to admission the pt was completely independent with all mobility, typically not using DME until recent increase in RLE pain. The pt now presents with minor limitations in functional mobility, ROM and strength in RLE, and dynamic stability due to above dx and resulting pain, and will continue to benefit from skilled PT to address these deficits. The pt and his wife report pain level and mobility is much better today, and pt was able to complete sit-stand transfers and hallway ambulation with supervision for safety. However, the pt is using a RW to off-weight his RLE due to pain, and will benefit from continued PT and mobility during admission to continue to wean from use of device. Anticipate that with pain controlled he will be able to return home without follow up therapies.        Recommendations for follow up therapy are one component of a multi-disciplinary discharge planning process, led by the attending physician.  Recommendations may be updated based on patient status, additional functional criteria and insurance authorization.  Follow Up Recommendations       Assistance Recommended at Discharge PRN  Patient can return home with the following  Assist for transportation;Help with stairs or ramp for entrance    Equipment Recommendations None recommended by PT  Recommendations for Other Services       Functional Status Assessment Patient has had a recent decline in their functional  status and demonstrates the ability to make significant improvements in function in a reasonable and predictable amount of time.     Precautions / Restrictions Precautions Precautions: Fall Precaution Comments: low fall Restrictions Weight Bearing Restrictions: Yes RLE Weight Bearing: Weight bearing as tolerated      Mobility  Bed Mobility Overal bed mobility: Modified Independent                  Transfers Overall transfer level: Needs assistance Equipment used: Rolling walker (2 wheels) Transfers: Sit to/from Stand Sit to Stand: Supervision           General transfer comment: for safety    Ambulation/Gait Ambulation/Gait assistance: Supervision Gait Distance (Feet): 150 Feet Assistive device: Rolling walker (2 wheels) Gait Pattern/deviations: Step-through pattern, Decreased stance time - right, Decreased weight shift to right Gait velocity: 0.25 m/s Gait velocity interpretation: <1.31 ft/sec, indicative of household ambulator   General Gait Details: antalgic gait wtih decreased wt on RLE due to pain. no overt LOB or buckling. dependent on RW to off-weight leg     Balance Overall balance assessment: Mild deficits observed, not formally tested (poor posture OOB with guarding and pain)                                           Pertinent Vitals/Pain Pain Assessment Pain Assessment: Faces Faces Pain Scale: Hurts little more Pain Location: RLE Pain Descriptors / Indicators: Tender Pain Intervention(s): Limited activity within patient's tolerance, Monitored during session, Repositioned  Home Living Family/patient expects to be discharged to:: Private residence Living Arrangements: Spouse/significant other Available Help at Discharge: Family;Available 24 hours/day Type of Home: House Home Access: Stairs to enter Entrance Stairs-Rails: Right Entrance Stairs-Number of Steps: 3   Home Layout: One level Home Equipment: Shower seat       Prior Function Prior Level of Function : Independent/Modified Independent;Driving             Mobility Comments: uses RW, enjoys working in his garden ADLs Comments: "very independent"     Higher education careers adviser   Dominant Hand: Right    Extremity/Trunk Assessment   Upper Extremity Assessment Upper Extremity Assessment: Overall WFL for tasks assessed    Lower Extremity Assessment Lower Extremity Assessment: RLE deficits/detail RLE Deficits / Details: limited by pain in calf/foot due to cellulitis. AROM limited by edema. reports sensation normal. WFL at knee and hip RLE: Unable to fully assess due to pain RLE Sensation: WNL RLE Coordination: WNL    Cervical / Trunk Assessment Cervical / Trunk Assessment: Normal  Communication   Communication: No difficulties  Cognition Arousal/Alertness: Awake/alert Behavior During Therapy: WFL for tasks assessed/performed Overall Cognitive Status: Within Functional Limits for tasks assessed                                 General Comments: able to follow all instructions and demo good safety awareness and problem solving        General Comments General comments (skin integrity, edema, etc.): VSS on RA    Exercises     Assessment/Plan    PT Assessment Patient needs continued PT services  PT Problem List Decreased strength;Decreased range of motion;Decreased activity tolerance;Decreased balance       PT Treatment Interventions DME instruction;Gait training;Stair training;Functional mobility training;Therapeutic activities;Therapeutic exercise;Balance training    PT Goals (Current goals can be found in the Care Plan section)  Acute Rehab PT Goals Patient Stated Goal: return to gardening PT Goal Formulation: With patient Time For Goal Achievement: 12/16/22 Potential to Achieve Goals: Good    Frequency Min 2X/week        AM-PAC PT "6 Clicks" Mobility  Outcome Measure Help needed turning from your back to your  side while in a flat bed without using bedrails?: None Help needed moving from lying on your back to sitting on the side of a flat bed without using bedrails?: None Help needed moving to and from a bed to a chair (including a wheelchair)?: A Little Help needed standing up from a chair using your arms (e.g., wheelchair or bedside chair)?: A Little Help needed to walk in hospital room?: A Little Help needed climbing 3-5 steps with a railing? : A Little 6 Click Score: 20    End of Session Equipment Utilized During Treatment: Gait belt Activity Tolerance: Patient tolerated treatment well Patient left: in chair;with call bell/phone within reach;with family/visitor present Nurse Communication: Mobility status PT Visit Diagnosis: Unsteadiness on feet (R26.81);Pain Pain - Right/Left: Right Pain - part of body: Leg;Ankle and joints of foot    Time: 1610-9604 PT Time Calculation (min) (ACUTE ONLY): 14 min   Charges:   PT Evaluation $PT Eval Low Complexity: 1 Low          Vickki Muff, PT, DPT   Acute Rehabilitation Department Office [phone] Secure Chat Communication Preferred  Ronnie Derby 12/02/2022, 1:18 PM

## 2022-12-03 ENCOUNTER — Inpatient Hospital Stay (HOSPITAL_COMMUNITY): Payer: Medicare HMO

## 2022-12-03 DIAGNOSIS — R209 Unspecified disturbances of skin sensation: Secondary | ICD-10-CM

## 2022-12-03 DIAGNOSIS — L03115 Cellulitis of right lower limb: Principal | ICD-10-CM

## 2022-12-03 LAB — FERRITIN: Ferritin: 309 ng/mL (ref 24–336)

## 2022-12-03 LAB — CBC
HCT: 35.3 % — ABNORMAL LOW (ref 39.0–52.0)
Hemoglobin: 12.2 g/dL — ABNORMAL LOW (ref 13.0–17.0)
MCH: 30.8 pg (ref 26.0–34.0)
MCHC: 34.6 g/dL (ref 30.0–36.0)
MCV: 89.1 fL (ref 80.0–100.0)
Platelets: 172 10*3/uL (ref 150–400)
RBC: 3.96 MIL/uL — ABNORMAL LOW (ref 4.22–5.81)
RDW: 12.8 % (ref 11.5–15.5)
WBC: 5.2 10*3/uL (ref 4.0–10.5)
nRBC: 0 % (ref 0.0–0.2)

## 2022-12-03 LAB — BASIC METABOLIC PANEL
Anion gap: 6 (ref 5–15)
BUN: 10 mg/dL (ref 8–23)
CO2: 25 mmol/L (ref 22–32)
Calcium: 8.2 mg/dL — ABNORMAL LOW (ref 8.9–10.3)
Chloride: 105 mmol/L (ref 98–111)
Creatinine, Ser: 0.91 mg/dL (ref 0.61–1.24)
GFR, Estimated: >60 mL/min (ref >60)
Glucose, Bld: 113 mg/dL — ABNORMAL HIGH (ref 70–99)
Potassium: 3.7 mmol/L (ref 3.5–5.1)
Sodium: 136 mmol/L (ref 135–145)

## 2022-12-03 LAB — LIPID PANEL
Cholesterol: 88 mg/dL (ref 0–200)
HDL: 26 mg/dL — ABNORMAL LOW (ref >40)
LDL Cholesterol: 43 mg/dL (ref 0–99)
Total CHOL/HDL Ratio: 3.4 RATIO
Triglycerides: 94 mg/dL (ref <150)
VLDL: 19 mg/dL (ref 0–40)

## 2022-12-03 LAB — CULTURE, BLOOD (ROUTINE X 2)
Culture: NO GROWTH
Special Requests: ADEQUATE
Special Requests: ADEQUATE

## 2022-12-03 LAB — VAS US ABI WITH/WO TBI
Left ABI: 1.19
Right ABI: 1.48

## 2022-12-03 LAB — MRSA NEXT GEN BY PCR, NASAL: MRSA by PCR Next Gen: NOT DETECTED

## 2022-12-03 MED ORDER — POLYETHYLENE GLYCOL 3350 17 G PO PACK
17.0000 g | PACK | Freq: Two times a day (BID) | ORAL | Status: DC
  Administered 2022-12-03 – 2022-12-04 (×3): 17 g via ORAL
  Filled 2022-12-03 (×4): qty 1

## 2022-12-03 NOTE — Progress Notes (Signed)
Ambulated patient around entire unit.  Tolerated walking and stated that walking improved his pain

## 2022-12-03 NOTE — TOC Initial Note (Signed)
Transition of Care [facility]) - Initial/Assessment Note    Patient Details  Name: Dean Mayer MRN: [id number] Date of Birth: 03-22-1941  Transition of Care [facility]) CM/SW Contact:    Durenda Guthrie, RN Phone Number: 12/03/2022, 8:30 AM  Clinical Narrative:                  Transition of Care [facility]) Department has reviewed patient and no TOC needs have been identified at this time. We will continue to monitor patient advancement through Interdisciplinary progressions and if new patient needs arise, please place a consult.       Patient Goals and CMS Choice            Expected Discharge Plan and Services                                              Prior Living Arrangements/Services                       Activities of Daily Living Home Assistive Devices/Equipment: Environmental consultant (specify type), Eyeglasses, Hearing aid, Shower chair with back ADL Screening (condition at time of admission) Patient's cognitive ability adequate to safely complete daily activities?: Yes Is the patient deaf or have difficulty hearing?: Yes (hearing aids at home) Does the patient have difficulty seeing, even when wearing glasses/contacts?: No Does the patient have difficulty concentrating, remembering, or making decisions?: No Patient able to express need for assistance with ADLs?: Yes Does the patient have difficulty dressing or bathing?: No Independently performs ADLs?: Yes (appropriate for developmental age) Does the patient have difficulty walking or climbing stairs?: Yes Weakness of Legs: Right Weakness of Arms/Hands: None  Permission Sought/Granted                  Emotional Assessment              Admission diagnosis:  Cellulitis [L03.90] Peripheral edema [R60.0] Cellulitis of right lower extremity [L03.115] Patient Active Problem List   Diagnosis Date Noted   Peripheral edema 12/02/2022   Cellulitis of right leg 12/01/2022   Hypertension 12/01/2022    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   Dyspnea on exertion 12/01/2022   Hypothyroidism 12/01/2022   Cellulitis 12/01/2022   PCP:  Angelique Blonder, MD Pharmacy:   [facility] - [place], [place] - [address] [place] [address] Phone: [phone] Fax: [phone]  [id number] - [place], [place] - [address] [place] [address] Phone: [phone] Fax: [phone]     Social Determinants of Health (SDOH) Social History: SDOH Screenings   Food Insecurity: No Food Insecurity (12/01/2022)  Housing: Low Risk  (12/01/2022)  Transportation Needs: No Transportation Needs (12/01/2022)  Utilities: Not At Risk (12/01/2022)  Tobacco Use: Medium Risk (12/01/2022)   SDOH Interventions: Housing Interventions: Intervention Not Indicated   Readmission Risk Interventions     No data to display

## 2022-12-03 NOTE — Hospital Course (Addendum)
Dean Mayer is a 82 y.o. male who was admitted to the [facility] Medicine Teaching Service at [facility] for refractory cellulitis of the right leg. Hospital course is outlined below by problem.   Cellulitis of right leg Presented with persistent redness and swelling of his right leg for 2 weeks after short course of doxycycline and clindamycin.  Vital signs stable on admission.  Exam also notable for cold, gray, hairless feet. UA unremarkable.  Blood cultures collected and were negative ***.  Right ankle x-ray significant for soft tissue edema and swelling without bony abnormalities.  DVT ultrasound negative.  Patient was given morphine and started on IV ceftriaxone.  ABIs ordered to assess for potential confounding PAD and demonstrated noncompressible right lower extremity arteries.  Over admission, he continued to improve on abx. He was transitioned from IV ceftriaxone to PO cefdinir for total course of 10 days at discharge.  Dyspnea on exertion Presented with dyspnea on exertion for the last couple of years without chest pain orthopnea.  All other symptoms bilateral lower extremity swelling that was present for 3 to 4 weeks.  Exam unremarkable.  Chest x-ray without acute findings.  Echo improved from prior in 2017 with EF 70-75% and no diastolic dysfunction.  Other conditions that were chronic and stable: Hypertension (home amlodipine and lisinopril continued)  Issues for follow up: Assess continued clinical improvement and completion of antibiotic course Ensure follow-up with vascular surgery ***

## 2022-12-03 NOTE — Progress Notes (Signed)
     Daily Progress Note Intern Pager: [phone]  Patient name: Dean Mayer Medical record number: [id number] Date of birth: Feb 07, 1941 Age: 82 y.o. Gender: male  Primary Care Provider: Cordial, Harlan Stains, MD Consultants: None Code Status: DNR   Pt Overview and Major Events to Date:  4/27- admitted   Assessment and Plan: Dean Mayer is a 82yo male admitted for treatment of cellulitis refractory to outpatient doxycycline/clindamycin. 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  * Cellulitis of right leg Suspect failure of outpt abx is related to poor penetration 2/2 likely PAD. Appears to be improving. PT with recommendations to continue therapy inpt but likely d/c home without follow up. OT signed off. - Continue IV ceftriaxone (4/27-) - F/u MRSA swab  - fall precautions, delirium precautions   Bilateral cold feet Patient with bilateral cold, hairless feet.  Concern for possible PAD.  Has had bilateral ABI in the past in 2022 without evidence of PAD at that time.   - ABIs ordered, f/u results - Continue home ASA 325 mg, atorvastatin 40 mg  Dyspnea on exertion Stable on RA. Chest x-ray without acute cardiopulmonary findings.  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 Repeat echo this admission with improvement of EF to 70-75% with normal diastolic parameters. BNP unimpressive at 147. Consider age-related deconditioning as contributor. - Monitor clinically, continue to work with PT while inpt  Hypertension BP acceptable range on home regimen. - Continue amlodipine and lisinopril  FEN/GI: Regular PPx: Lovenox Dispo:Home pending clinical improvement  Subjective:  Feels better this morning.  Still having pain and swelling.  He is able to walk to the chair and back to his bed.  Objective: Temp:  [97.6 F (36.4 C)-98.3 F (36.8 C)] 98.1 F (36.7 C) (04/29 0747) Pulse Rate:  [61-73] 67  (04/29 0747) Resp:  [14-18] 18 (04/29 0747) BP: (141-155)/(53-75) 155/64 (04/29 0747) SpO2:  [94 %-99 %] 96 % (04/29 0747) Physical Exam: General: Alert and oriented, in NAD Skin: Warm, dry, and intact; right lower extremity erythematous to the level of the mid shin with overlying dried skin HEENT: NCAT, EOM grossly normal, midline nasal septum Cardiac: Regular rate, 2+ pitting edema to the right lower extremity, 1+ pitting edema of the left lower extremity Respiratory: Breathing and speaking comfortably on RA Abdominal: Nondistended Extremities: Moves all extremities grossly equally Neurological: No gross focal deficit Psychiatric: Appropriate mood and affect   Laboratory: Most recent CBC Lab Results  Component Value Date   WBC 5.2 12/03/2022   HGB 12.2 (L) 12/03/2022   HCT 35.3 (L) 12/03/2022   MCV 89.1 12/03/2022   PLT 172 12/03/2022   Most recent BMP    Latest Ref Rng & Units 12/03/2022    1:27 AM  BMP  Glucose 70 - 99 mg/dL 130   BUN 8 - 23 mg/dL 10   Creatinine 8.65 - 1.24 mg/dL 7.84   Sodium 696 - 295 mmol/L 136   Potassium 3.5 - 5.1 mmol/L 3.7   Chloride 98 - 111 mmol/L 105   CO2 22 - 32 mmol/L 25   Calcium 8.9 - 10.3 mg/dL 8.2    Janeal Holmes, MD 12/03/2022, 9:03 AM PGY-1, [place] [facility] FPTS Intern pager: [phone], text pages welcome Secure chat group [facility] Teaching Service

## 2022-12-03 NOTE — Progress Notes (Signed)
ABI's have been completed. Preliminary results can be found in CV Proc through chart review.   12/03/22 11:52 AM Olen Cordial RVT

## 2022-12-03 NOTE — Plan of Care (Signed)
  Problem: Education: Goal: Knowledge of General Education information will improve Description: Including pain rating scale, medication(s)/side effects and non-pharmacologic comfort measures Outcome: Progressing   Problem: Activity: Goal: Risk for activity intolerance will decrease Outcome: Progressing   Problem: Coping: Goal: Level of anxiety will decrease Outcome: Progressing   Problem: Elimination: Goal: Will not experience complications related to bowel motility Outcome: Progressing Goal: Will not experience complications related to urinary retention Outcome: Progressing   Problem: Pain Managment: Goal: General experience of comfort will improve Outcome: Progressing   Problem: Safety: Goal: Ability to remain free from injury will improve Outcome: Progressing   

## 2022-12-04 DIAGNOSIS — I739 Peripheral vascular disease, unspecified: Secondary | ICD-10-CM

## 2022-12-04 MED ORDER — CEFDINIR 300 MG PO CAPS
300.0000 mg | ORAL_CAPSULE | Freq: Two times a day (BID) | ORAL | Status: DC
  Administered 2022-12-04 – 2022-12-05 (×3): 300 mg via ORAL
  Filled 2022-12-04 (×4): qty 1

## 2022-12-04 MED ORDER — ONDANSETRON 4 MG PO TBDP
4.0000 mg | ORAL_TABLET | Freq: Three times a day (TID) | ORAL | Status: DC | PRN
  Administered 2022-12-04: 4 mg via ORAL
  Filled 2022-12-04: qty 1

## 2022-12-04 NOTE — Progress Notes (Signed)
Physical Therapy Treatment Patient Details Name: Dean Mayer MRN: [id number] DOB: 05-14-1941 Today's Date: 12/04/2022   History of Present Illness Pt presenting 4/27 with RLE pain that began 2 weeks ago. Has been receiving outpatient antibiotic treatment for cellulitis. 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    PT Comments    Patient making excellent progress with mobility and eager to work with therapist. Pt completed sit<>stand, gait, and stair mobility with supervision. Pt has flexed posture in standing and throughout gait, cues for safe position to RW throughout as he tends to advance it too far forward and to side. Pt performed stairs with supervision and bil/single rail, no LOB throughout. EOS pt returned to recliner and call bell within reach, all needs met.    Recommendations for follow up therapy are one component of a multi-disciplinary discharge planning process, led by the attending physician.  Recommendations may be updated based on patient status, additional functional criteria and insurance authorization.  Follow Up Recommendations       Assistance Recommended at Discharge PRN  Patient can return home with the following Assist for transportation;Help with stairs or ramp for entrance   Equipment Recommendations  Rolling walker (2 wheels)    Recommendations for Other Services       Precautions / Restrictions Precautions Precautions: Fall Precaution Comments: low fall Restrictions Weight Bearing Restrictions: Yes RLE Weight Bearing: Weight bearing as tolerated     Mobility  Bed Mobility Overal bed mobility: Modified Independent             General bed mobility comments: pt OOB in recliner    Transfers Overall transfer level: Needs assistance Equipment used: Rolling walker (2 wheels) Transfers: Sit to/from Stand Sit to Stand: Supervision           General  transfer comment: for safety    Ambulation/Gait Ambulation/Gait assistance: Supervision Gait Distance (Feet): 200 Feet Assistive device: Rolling walker (2 wheels) Gait Pattern/deviations: Step-through pattern, Decreased stance time - right, Decreased weight shift to right Gait velocity: decr     General Gait Details: supervision for safety, cues for proximity to RW especially with turns as pt tends to move walker away and to the side of his BOS.   Stairs Stairs: Yes Stairs assistance: Supervision Stair Management: Two rails, One rail Right, Step to pattern, Forwards Number of Stairs: 5 General stair comments: pt with smooth step to pattern and no LOB noted. pt required cues to use single rail to simulate home set up.   Wheelchair Mobility    Modified Rankin (Stroke Patients Only)       Balance Overall balance assessment: Mild deficits observed, not formally tested (poor posture OOB with guarding and pain)                                          Cognition Arousal/Alertness: Awake/alert Behavior During Therapy: WFL for tasks assessed/performed Overall Cognitive Status: Within Functional Limits for tasks assessed                                 General Comments: able to follow all instructions and demo good safety awareness and problem solving        Exercises      General Comments  Pertinent Vitals/Pain Pain Assessment Pain Assessment: Faces Faces Pain Scale: Hurts a little bit Pain Location: RLE Pain Descriptors / Indicators: Tender Pain Intervention(s): Limited activity within patient's tolerance, Monitored during session, Repositioned    Home Living                          Prior Function            PT Goals (current goals can now be found in the care plan section) Acute Rehab PT Goals Patient Stated Goal: return to gardening PT Goal Formulation: With patient Time For Goal Achievement:  12/16/22 Potential to Achieve Goals: Good Progress towards PT goals: Progressing toward goals    Frequency    Min 2X/week      PT Plan Current plan remains appropriate    Co-evaluation              AM-PAC PT "6 Clicks" Mobility   Outcome Measure  Help needed turning from your back to your side while in a flat bed without using bedrails?: None Help needed moving from lying on your back to sitting on the side of a flat bed without using bedrails?: None Help needed moving to and from a bed to a chair (including a wheelchair)?: A Little Help needed standing up from a chair using your arms (e.g., wheelchair or bedside chair)?: None Help needed to walk in hospital room?: A Little Help needed climbing 3-5 steps with a railing? : A Little 6 Click Score: 21    End of Session Equipment Utilized During Treatment: Gait belt Activity Tolerance: Patient tolerated treatment well Patient left: in chair;with call bell/phone within reach;with family/visitor present Nurse Communication: Mobility status PT Visit Diagnosis: Unsteadiness on feet (R26.81);Pain Pain - Right/Left: Right Pain - part of body: Leg;Ankle and joints of foot     Time: 1610-9604 PT Time Calculation (min) (ACUTE ONLY): 13 min  Charges:  $Gait Training: 8-22 mins                     Wynn Maudlin, DPT Acute Rehabilitation Services Office [phone]  12/04/22 4:45 PM

## 2022-12-04 NOTE — Progress Notes (Signed)
Daily Progress Note Intern Pager: [phone]  Patient name: Dean Mayer Medical record number: [id number] Date of birth: 02/14/1941 Age: 82 y.o. Gender: male  Primary Care Provider: Cordial, Harlan Stains, MD Consultants: None Code Status: DNR   Pt Overview and Major Events to Date:  4/27- admitted   Assessment and Plan: Dean Mayer is a 82yo male admitted for treatment of cellulitis refractory to outpatient doxycycline/clindamycin. 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  * Cellulitis of right leg Suspect failure of outpt abx is related to poor penetration 2/2 likely PAD. Appears to be improving. PT with recommendations to continue therapy inpt but likely d/c home without follow up. OT signed off. MRSA swab negative. - Switch from IV ceftriaxone (4/27-4/29) to p.o. cefdinir (4/30-) for total 10-day course of treatment - fall precautions, delirium precautions   Bilateral cold feet Patient with bilateral cold, hairless feet.  Concern for possible PAD.  Has had bilateral ABI in the past in 2022 without evidence of PAD at that time. ABIs with noncompressible RLE arteries on ABIs. - Continue home ASA 325 mg, atorvastatin 40 mg - Consult VVS for further recommendations  Dyspnea on exertion Stable on RA. Chest x-ray without acute cardiopulmonary findings.  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 Repeat echo this admission with improvement of EF to 70-75% with normal diastolic parameters. BNP unimpressive at 147. Consider age-related deconditioning as contributor. - Monitor clinically, continue to work with PT while inpt  Hypertension BP acceptable range on home regimen. - Continue amlodipine and lisinopril  FEN/GI: Regular PPx: Lovenox Dispo:Home pending clinical improvement  Subjective:  Remains with pain in his right ankle.  Otherwise pain is improved as well as swelling.  He is  still able to ambulate.  Objective: Temp:  [97.5 F (36.4 C)-98.3 F (36.8 C)] 97.5 F (36.4 C) (04/30 0725) Pulse Rate:  [68-72] 71 (04/30 0725) Resp:  [16-19] 18 (04/30 0725) BP: (121-171)/(53-69) 171/69 (04/30 0725) SpO2:  [93 %-98 %] 93 % (04/30 0725) Physical Exam: General: Alert and oriented, in NAD Skin: Warm, dry, and intact; right lower extremity erythematous to the level of the mid shin with overlying dried skin HEENT: NCAT, EOM grossly normal, midline nasal septum Cardiac: Regular rate and rhythm, 2+ pitting edema to the right lower extremity, trace-1+ pitting edema of the left lower extremity Respiratory: Clear to auscultation bilaterally anteriorly, breathing and speaking comfortably on RA Abdominal: Soft, nondistended, nontender Extremities: Moves all extremities grossly equally Neurological: No gross focal deficit Psychiatric: Appropriate mood and affect   Laboratory: Most recent CBC Lab Results  Component Value Date   WBC 5.2 12/03/2022   HGB 12.2 (L) 12/03/2022   HCT 35.3 (L) 12/03/2022   MCV 89.1 12/03/2022   PLT 172 12/03/2022   Most recent BMP    Latest Ref Rng & Units 12/03/2022    1:27 AM  BMP  Glucose 70 - 99 mg/dL 147   BUN 8 - 23 mg/dL 10   Creatinine 8.29 - 1.24 mg/dL 5.62   Sodium 130 - 865 mmol/L 136   Potassium 3.5 - 5.1 mmol/L 3.7   Chloride 98 - 111 mmol/L 105   CO2 22 - 32 mmol/L 25   Calcium 8.9 - 10.3 mg/dL 8.2    Janeal Holmes, MD 12/04/2022, 2:31 PM PGY-1, [facility] FPTS Intern pager: [phone], text pages welcome Secure chat group [facility]  Hospital Teaching Service

## 2022-12-04 NOTE — TOC Progression Note (Signed)
Transition of Care [facility]) - Progression Note    Patient Details  Name: Dean Mayer MRN: [id number] Date of Birth: [birth year]  Transition of [facility] CM/SW Contact  Lawerance Sabal, RN Phone Number: 12/04/2022, 4:17 PM  Clinical Narrative:     RW ordered to room at request of patient and wife       Expected Discharge Plan and Services                         DME Arranged: Walker rolling DME Agency: [employer] Date DME Agency Contacted: 12/04/22 Time DME Agency Contacted: [phone] Representative spoke with at DME Agency: Barbara Cower             Social Determinants of Health (SDOH) Interventions SDOH Screenings   Food Insecurity: No Food Insecurity (12/01/2022)  Housing: Low Risk  (12/01/2022)  Transportation Needs: No Transportation Needs (12/01/2022)  Utilities: Not At Risk (12/01/2022)  Tobacco Use: Medium Risk (12/01/2022)    Readmission Risk Interventions     No data to display

## 2022-12-05 ENCOUNTER — Other Ambulatory Visit (HOSPITAL_COMMUNITY): Payer: Self-pay

## 2022-12-05 DIAGNOSIS — R600 Localized edema: Secondary | ICD-10-CM

## 2022-12-05 LAB — CULTURE, BLOOD (ROUTINE X 2)

## 2022-12-05 MED ORDER — CEFDINIR 300 MG PO CAPS
300.0000 mg | ORAL_CAPSULE | Freq: Two times a day (BID) | ORAL | 0 refills | Status: AC
  Filled 2022-12-05: qty 10, 5d supply, fill #0

## 2022-12-05 MED ORDER — OXYCODONE HCL 5 MG PO TABS
5.0000 mg | ORAL_TABLET | Freq: Four times a day (QID) | ORAL | 0 refills | Status: AC | PRN
  Filled 2022-12-05: qty 12, 3d supply, fill #0

## 2022-12-05 NOTE — Consult Note (Addendum)
Hospital Consult    Reason for Consult:  Noncompressible ABI Requesting Physician:  Meredeth Ide MRN #:  [id number]  History of Present Illness: This is a 82 y.o. male with past medical history of throat cancer and prostate cancer s/p radiation and chemo, HTN, HLD, carotid stenosis s/p Right TCAR, and Hypothyroidism who presented to ER with worsening right leg pain and swelling. He explains that this started about two weeks ago after he had been outside working in his garden. He said he felt some right leg discomfort while working and then that evening the leg became very swollen and by the next day it was very painful, swollen and red. He had presented to ED on 4/23 and was initiated on Doxycyline but he had GI upset from this so he was seen by his PCP and was placed on clindamycin. However he felt his legs were not improving so he came to ER. He reports subjective fevers. He had a venous duplex which was negative for DVT. There was some concern about his feet being cold so bilateral ABI's were ordered. These were mildly abnormal. He says that prior to this occurring that he does not regularly have swelling in his legs. He denies any pain in his legs on ambulation or at rest. He has no non healing wounds. He says he remains active as he can. He is on Aspirin and Statin. Vascular surgery was consulted for evaluation of his lower extremity arterial disease based on his non compressible ABIs.   Past Medical History:  Diagnosis Date   Eye abnormalities    GERD (gastroesophageal reflux disease)    Hypertension    Kidney stones    Throat cancer [facility])     Past Surgical History:  Procedure Laterality Date   EYE SURGERY     TONSILLECTOMY      No Known Allergies  Prior to Admission medications   Medication Sig Start Date End Date Taking? Authorizing Provider  acetaminophen (TYLENOL) 500 MG tablet Take 1,000 mg by mouth 2 (two) times daily as needed for mild pain or moderate pain.   Yes  [provider]  amLODipine (NORVASC) 10 MG tablet Take 10 mg by mouth daily.   Yes [provider]  aspirin EC 325 MG tablet Take 325 mg by mouth daily.   Yes [provider]  atorvastatin (LIPITOR) 40 MG tablet Take 40 mg by mouth every evening.   Yes [provider]  brimonidine (ALPHAGAN) 0.2 % ophthalmic solution Place 1 drop into the right eye 2 (two) times daily.   Yes [provider]  clindamycin (CLEOCIN) 300 MG capsule Take 300 mg by mouth 3 (three) times daily. 10 day course.   Yes [provider]  fluticasone (FLONASE) 50 MCG/ACT nasal spray Place 2 sprays into the nose daily as needed for allergies.   Yes [provider]  ibuprofen (ADVIL) 800 MG tablet Take 800 mg by mouth every 8 (eight) hours as needed for moderate pain.   Yes [provider]  levothyroxine (SYNTHROID) 100 MCG tablet Take 100 mcg by mouth daily.   Yes [provider]  lisinopril (ZESTRIL) 5 MG tablet Take 5 mg by mouth 2 (two) times daily.   Yes [provider]  Multiple Vitamins-Minerals (ONE-A-DAY 50 PLUS PO) Take 1 tablet by mouth daily.   Yes [provider]  Omega-3 Fatty Acids (FISH OIL) 1200 MG CAPS Take 1,200 mg by mouth daily.   Yes [provider]  tamsulosin [facility])  0.4 MG CAPS capsule Take 0.4 mg by mouth daily.   Yes [provider]  traMADol (ULTRAM) 50 MG tablet Take 50 mg by mouth 2 (two) times daily as needed for severe pain.   Yes [provider]  doxycycline (VIBRAMYCIN) 100 MG capsule Take 1 capsule (100 mg total) by mouth 2 (two) times daily. One po bid x 7 days Patient not taking: Reported on 12/01/2022 11/27/22   Melene Plan, DO    Social History   Socioeconomic History   Marital status: Married    Spouse name: Not on file   Number of children: Not on file   Years of education: Not on file   Highest education level: Not on file  Occupational History   Not on file   Tobacco Use   Smoking status: Former   Smokeless tobacco: Never  Substance and Sexual Activity   Alcohol use: No   Drug use: No   Sexual activity: Not on file  Other Topics Concern   Not on file  Social History Narrative   Not on file   Social Determinants of Health   Financial Resource Strain: Not on file  Food Insecurity: No Food Insecurity (12/01/2022)   Hunger Vital Sign    Worried About Running Out of Food in the Last Year: Never true    Ran Out of Food in the Last Year: Never true  Transportation Needs: No Transportation Needs (12/01/2022)   PRAPARE - Administrator, Civil Service (Medical): No    Lack of Transportation (Non-Medical): No  Physical Activity: Not on file  Stress: Not on file  Social Connections: Not on file  Intimate Partner Violence: Unknown (12/01/2022)   Humiliation, Afraid, Rape, and Kick questionnaire    Fear of Current or Ex-Partner: No    Emotionally Abused: Patient declined    Physically Abused: No    Sexually Abused: No     Family History  Problem Relation Age of Onset   Stroke Mother    Heart attack Mother     ROS: Otherwise negative unless mentioned in HPI  Physical Examination  Vitals:   12/04/22 1954 12/05/22 0828  BP: (!) 143/53 (!) 132/90  Pulse: 65 83  Resp: 18 17  Temp: 98 F (36.7 C) 98.2 F (36.8 C)  SpO2: 97% 97%   Body mass index is 31.16 kg/m.  General:  WDWN in NAD Gait: Not observed HENT: WNL, normocephalic Pulmonary: normal non-labored breathing, without wheezing Cardiac: regular rate and rhythm Abdomen: soft, NT/ND, no masses Vascular Exam/Pulses: 2+ femoral pulses bilaterally, 2+ popliteal pulses bilaterally, right PT pulse palpable, unable to palpate right DP due to edema. Brisk Dp/ PT and Peroneal doppler signals in right foot. Left DP and PT pulses palpable. Feet warm and well perfused. Motor and sensation intact. Extremities: without ischemic changes, without Gangrene , with cellulitis of  the right distal leg and ankle; without open wounds. Edema of bilateral lower extremities right much greater than left Musculoskeletal: no muscle wasting or atrophy  Neurologic: A&O X 3;  No focal weakness or paresthesias are detected; speech is fluent/normal Psychiatric:  The pt has Normal affect  CBC    Component Value Date/Time   WBC 5.2 12/03/2022 0127   RBC 3.96 (L) 12/03/2022 0127   HGB 12.2 (L) 12/03/2022 0127   HCT 35.3 (L) 12/03/2022 0127   PLT 172 12/03/2022 0127   MCV 89.1 12/03/2022 0127   MCH 30.8 12/03/2022 0127   MCHC 34.6 12/03/2022 0127  RDW 12.8 12/03/2022 0127   LYMPHSABS 0.8 12/01/2022 1140   MONOABS 0.4 12/01/2022 1140   EOSABS 0.1 12/01/2022 1140   BASOSABS 0.0 12/01/2022 1140    BMET    Component Value Date/Time   NA 136 12/03/2022 0127   K 3.7 12/03/2022 0127   CL 105 12/03/2022 0127   CO2 25 12/03/2022 0127   GLUCOSE 113 (H) 12/03/2022 0127   BUN 10 12/03/2022 0127   CREATININE 0.91 12/03/2022 0127   CALCIUM 8.2 (L) 12/03/2022 0127   GFRNONAA >60 12/03/2022 0127   GFRAA >60 04/10/2019 1944    COAGS: No results found for: "INR", "PROTIME"   Non-Invasive Vascular Imaging:   +-------+-----------+-----------+------------+------------+  ABI/TBIToday's ABIToday's TBIPrevious ABIPrevious TBI  +-------+-----------+-----------+------------+------------+  Right 1.48       0.43                                 +-------+-----------+-----------+------------+------------+  Left  1.19       0.63                                 +-------+-----------+-----------+------------+------------+   Statin:  Yes.   Beta Blocker:  No. Aspirin:  Yes.   ACEI:  Yes.   ARB:  No. CCB use:  Yes Other antiplatelets/anticoagulants:  Lovenox  ASSESSMENT/PLAN: This is a 82 y.o. male past medical history of throat cancer and prostate cancer s/p radiation and chemo, HTN, HLD, carotid stenosis s/p Right TCAR, and Hypothyroidism who presented to ER with  worsening right leg pain and swelling. He clinically has cellulitis of right lower extremity. He is now on IV Cefdinir x 10 days. His ABIs on the right are falsely elevated with mildly abnormal TBIs. On exam his PT pulses is palpable, the DP is not secondary to edema. He has brisk doppler signals in right foot. He endorses no symptoms of arterial disease prior to this recent episode of swelling. He has no open wounds. I do not think currently warrants any intervention at this time. Would recommend continuation of Aspirin and statin. I discussed with patient proper elevation and also utilization of compression stockings to help with edema. We will arrange outpatient follow up in 1 month. The on call vascular surgeon,Dr. Myra Gianotti,  will see patient later today to provide any further management plans   [facility] [phone] 12/05/2022  8:33 AM  I agree with the above.  I have seen and evaluated the patient.  Briefly this is a 82 year old gentleman with right leg swelling and cellulitis.  He underwent noninvasive vascular imaging that was abnormal with noncompressible vessels.  We were asked to evaluate his circulation.  On exam, he has significant pitting edema particular on the right leg with resolving cellulitis.  He has brisk Doppler signals.  At this time I do not think that his arterial disease is contributing to his inability to resolve his cellulitis.  This is most likely related to the edema he has in his leg.  Therefore I have recommended that we get him into compression socks and keep his legs elevated in addition to his IV antibiotics.  No acute vascular intervention is recommended at this time.  I will have him follow-up in my office in 1 month to make sure everything has resolved  Durene Cal

## 2022-12-05 NOTE — Discharge Instructions (Signed)
Dear Dean Mayer,   Thank you for letting us participate in your care! In this section, you will find a brief hospital admission summary of why you were admitted to the hospital, what happened during your admission, your diagnosis/diagnoses, and recommended follow up.  Primary diagnosis: Right leg cellulitis Treatment plan: You were treated with IV antibiotics and transitioned to oral antibiotics. Please continue to take the Cefdinir until 5/6. Secondary diagnosis: Poor perfusion in lower extremities Treatment plan: You were seen by the vascular doctor for evaluation of the blood flow in your legs   POST-HOSPITAL & CARE INSTRUCTIONS We recommend following up with your PCP within 1 week from being discharged from the hospital. Please let PCP/Specialists know of any changes in medications that were made which you will be able to see in the medications section of this packet. Please also follow up with the vascular doctor  DOCTOR'S APPOINTMENTS & FOLLOW UP No future appointments.   Thank you for choosing [facility]! Take care and be well!  Family Medicine Teaching Service Inpatient Team [place]  [facility]  [address] [place], [place] [address] [phone]

## 2022-12-05 NOTE — Care Management Important Message (Signed)
Important Message  Patient Details  Name: Dean Mayer MRN: [id number] Date of Birth: 1941-06-01   Medicare Important Message Given:  Yes     Sherilyn Banker 12/05/2022, 1:11 PM

## 2022-12-05 NOTE — Discharge Summary (Signed)
Family Medicine Teaching [facility] Discharge Summary  Patient name: Dean Mayer Medical record number: [id number] Date of birth: 16-Feb-1941 Age: 82 y.o. Gender: male Date of Admission: 12/01/2022  Date of Discharge: 12/05/2022 Admitting Physician: Lockie Mola, MD  Primary Care Provider: Angelique Blonder, MD Consultants: None  Indication for Hospitalization: Cellulitis  Discharge Diagnoses/Problem List:  Principal Problem for Admission: R LE Cellulitis Other Problems addressed during stay:  Principal Problem:   Cellulitis of right leg Active Problems:   Bilateral cold feet   Hypertension   Dyspnea on exertion   Hypothyroidism   Peripheral edema   Cellulitis of right lower extremity   Peripheral arterial disease [facility])   Brief Hospital Course:  Dean Mayer is a 82 y.o. male who was admitted to the [facility] Medicine Teaching Service at [facility] for refractory cellulitis of the right leg. Hospital course is outlined below by problem.   Cellulitis of right leg Presented with persistent redness and swelling of his right leg for 2 weeks after short course of doxycycline and clindamycin.  Vital signs stable on admission.  Exam also notable for cold, gray, hairless feet. Blood cultures collected and were negative.  Right ankle XR significant for soft tissue edema and swelling without bony abnormalities.  DVT ultrasound negative.  Pain management with morphine and started on IV ceftriaxone.  ABIs ordered to assess for potential confounding PAD and demonstrated noncompressible right lower extremity arteries.  VVS consulted and recommended outpatient follow up. Over admission, he continued to improve on abx. He was transitioned from IV ceftriaxone to PO cefdinir for total course of 10 days at discharge.  Dyspnea on exertion Presented with dyspnea on exertion for the last couple of years without chest pain orthopnea.  All other symptoms bilateral lower extremity swelling that was  present for 3 to 4 weeks.  Exam unremarkable. Chest x-ray without acute findings. Echo improved from prior in 2017 with EF 70-75% and no diastolic dysfunction. No symptoms reported during hospitalization.  Other conditions that were chronic and stable: Hypertension (home amlodipine and lisinopril continued)  Issues for follow up: Assess continued clinical improvement and completion of antibiotic course Ensure follow-up with vascular surgery in 1 month    Disposition: Stable  Discharge Condition: Home  Discharge Exam:  Vitals:   12/04/22 1954 12/05/22 0828  BP: (!) 143/53 (!) 132/90  Pulse: 65 83  Resp: 18 17  Temp: 98 F (36.7 C) 98.2 F (36.8 C)  SpO2: 97% 97%   General: Well-appearing, sitting at edge of the bed, NAD Cardiovascular: RRR without murmur Respiratory: CTAB, normal WOB on RA Abdomen: Soft, non-distended Extremities: R LE with area of erythema and warmth demarcated with sharpie, full ROM. No palpable dorsalis pedis or posterior tibialis pulse. Motor and sensation intake. Some edema noted.  Significant Procedures: None  Significant Labs and Imaging:  No results for input(s): "WBC", "HGB", "HCT", "PLT" in the last 48 hours. No results for input(s): "NA", "K", "CL", "CO2", "GLUCOSE", "BUN", "CREATININE", "CALCIUM", "MG", "PHOS", "ALKPHOS", "AST", "ALT", "ALBUMIN", "PROTEIN" in the last 48 hours.  Pertinent Imaging: VAS [place] ABI WITH/WO TBI Result Date: 12/03/2022 Summary: Right: Resting right ankle-brachial index indicates noncompressible right lower extremity arteries. The right toe-brachial index is abnormal. Left: Resting left ankle-brachial index is within normal range. The left toe-brachial index is abnormal. *See table(s) above for measurements and observations.    ECHOCARDIOGRAM COMPLETE Result Date: 12/02/2022 IMPRESSIONS  1. Left ventricular ejection fraction, by estimation, is 70 to 75%. The left  ventricle has hyperdynamic function. The left ventricle has no  regional wall motion abnormalities. Left ventricular diastolic parameters were normal.  2. Right ventricular systolic function is normal. The right ventricular size is normal. Tricuspid regurgitation signal is inadequate for assessing PA pressure.  3. The mitral valve is normal in structure. No evidence of mitral valve regurgitation. No evidence of mitral stenosis.  4. The aortic valve is normal in structure. Aortic valve regurgitation is not visualized. No aortic stenosis is present.  5. The inferior vena cava is normal in size with greater than 50% respiratory variability, suggesting right atrial pressure of 3 mmHg.  DG Ankle 2 Views Right Result Date: 12/01/2022 IMPRESSION: Soft tissue edema and swelling. No other acute abnormalities. No soft tissue gas identified.  DG Chest 2 View Result Date: 12/01/2022 IMPRESSION: Limited study as above without acute abnormality to explain signs of infection.  VAS [place] LOWER EXTREMITY VENOUS (DVT) (7a-7p) Result Date: 12/01/2022 Summary: RIGHT: - There is no evidence of deep vein thrombosis in the lower extremity.  - No cystic structure found in the popliteal fossa. Subcutaneous edema in area of calf and ankle.  LEFT: - No evidence of common femoral vein obstruction.  *See table(s) above for measurements and observations.   US Venous Img Lower Unilateral Right (DVT) Result Date: 11/28/2022 IMPRESSION: Negative.   Results/Tests Pending at Time of Discharge: None  Discharge Medications:  Allergies as of 12/05/2022   No Known Allergies      Medication List     STOP taking these medications    clindamycin 300 MG capsule Commonly known as: CLEOCIN   doxycycline 100 MG capsule Commonly known as: VIBRAMYCIN   ibuprofen 800 MG tablet Commonly known as: ADVIL       TAKE these medications    acetaminophen 500 MG tablet Commonly known as: TYLENOL Take 1,000 mg by mouth 2 (two) times daily as needed for mild pain or moderate pain.   amLODipine 10  MG tablet Commonly known as: NORVASC Take 10 mg by mouth daily.   aspirin EC 325 MG tablet Take 325 mg by mouth daily.   atorvastatin 40 MG tablet Commonly known as: LIPITOR Take 40 mg by mouth every evening.   brimonidine 0.2 % ophthalmic solution Commonly known as: ALPHAGAN Place 1 drop into the right eye 2 (two) times daily.   cefdinir 300 MG capsule Commonly known as: OMNICEF Take 1 capsule (300 mg total) by mouth every 12 (twelve) hours for 5 days.   Fish Oil 1200 MG Caps Take 1,200 mg by mouth daily.   fluticasone 50 MCG/ACT nasal spray Commonly known as: FLONASE Place 2 sprays into the nose daily as needed for allergies.   levothyroxine 100 MCG tablet Commonly known as: SYNTHROID Take 100 mcg by mouth daily.   lisinopril 5 MG tablet Commonly known as: ZESTRIL Take 5 mg by mouth 2 (two) times daily.   ONE-A-DAY 50 PLUS PO Take 1 tablet by mouth daily.   oxyCODONE 5 MG immediate release tablet Commonly known as: Oxy IR/ROXICODONE Take 1 tablet (5 mg total) by mouth every 6 (six) hours as needed for up to 3 days for moderate pain.   tamsulosin 0.4 MG Caps capsule Commonly known as: FLOMAX Take 0.4 mg by mouth daily.   traMADol 50 MG tablet Commonly known as: ULTRAM Take 50 mg by mouth 2 (two) times daily as needed for severe pain.               Durable Medical  Equipment  (From admission, onward)           Start     Ordered   12/04/22 1617  For home use only DME Walker rolling  Once       Question Answer Comment  Walker: With 5 Inch Wheels   Patient needs a walker to treat with the following condition Weakness      12/04/22 1616            Discharge Instructions: Please refer to Patient Instructions section of EMR for full details.  Patient was counseled important signs and symptoms that should prompt return to medical care, changes in medications, dietary instructions, activity restrictions, and follow up appointments.   Follow-Up  Appointments: With PCP within 1 week and with VVS in 1 month   Elberta Fortis, MD 12/05/2022, 12:01 PM PGY-1, [facility]

## 2022-12-05 NOTE — Progress Notes (Signed)
     Daily Progress Note Intern Pager: [phone]  Patient name: Dean Mayer Medical record number: [id number] Date of birth: Apr 22, 1941 Age: 82 y.o. Gender: male  Primary Care Provider: Cordial, Harlan Stains, MD Consultants: None Code Status: DNR  Pt Overview and Major Events to Date:  4/27: Admitted to FMTS  Assessment and Plan: Dean Mayer is a 82yo male admitted for treatment of cellulitis refractory to outpatient doxycycline/clindamycin. 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   * Cellulitis of right leg Clinically stable, some erythema and warmth noted upon exam. Poor healing likely in the setting of poor perfusion of R LE. Pain well controlled, using prn Oxy but feels he can do well at home with just Tylenol. -Continue Cefdinir (until 5/6) for total 10-day course of treatment -Fall precautions -Pain management: Sch Tylenol 650mg  q6h scheduled and Oxy 5mg  prn  Bilateral cold feet Consulted [facility] this AM for abnormal ABIs, will see patient today. Possible outpatient work up. -Continue home ASA 325 mg, atorvastatin 40 mg -VVS following, appreciate recommendations  Dyspnea on exertion Resolved, active at home without concerns.   Hypertension Stable on home regimen. -Continue amlodipine 10mg  and lisinopril 5mg  BID   FEN/GI: Regular PPx: Lovenox Dispo:Home pending clinical improvement   Subjective:  Patient assessed at bedside, sitting at edge of the bed eating breakfast. Reports his pain is improved. No pain in legs with walking. [place], PCP there he follows with regularly.  Objective: Temp:  [98 F (36.7 C)-98.3 F (36.8 C)] 98.2 F (36.8 C) (05/01 0828) Pulse Rate:  [65-83] 83 (05/01 0828) Resp:  [16-18] 17 (05/01 0828) BP: (132-143)/(53-90) 132/90 (05/01 0828) SpO2:  [96 %-97 %] 97 % (05/01 0828) Physical Exam: General: Well-appearing, sitting at edge of the bed,  NAD Cardiovascular: RRR without murmur Respiratory: CTAB, normal WOB on RA Abdomen: Soft, non-distended Extremities: R LE with area of erythema and warmth demarcated with sharpie, full ROM. No palpable dorsalis pedis or posterior tibialis pulse. Motor and sensation intake. Some edema noted.  Laboratory: Most recent CBC Lab Results  Component Value Date   WBC 5.2 12/03/2022   HGB 12.2 (L) 12/03/2022   HCT 35.3 (L) 12/03/2022   MCV 89.1 12/03/2022   PLT 172 12/03/2022   Most recent BMP    Latest Ref Rng & Units 12/03/2022    1:27 AM  BMP  Glucose 70 - 99 mg/dL 147   BUN 8 - 23 mg/dL 10   Creatinine 8.29 - 1.24 mg/dL 5.62   Sodium 130 - 865 mmol/L 136   Potassium 3.5 - 5.1 mmol/L 3.7   Chloride 98 - 111 mmol/L 105   CO2 22 - 32 mmol/L 25   Calcium 8.9 - 10.3 mg/dL 8.2     Other pertinent labs: BC no growth   Elberta Fortis, MD 12/05/2022, 8:56 AM  PGY-1, [facility] FPTS Intern pager: [phone], text pages welcome Secure chat group [facility] Teaching Service

## 2022-12-06 LAB — CULTURE, BLOOD (ROUTINE X 2)

## 2023-01-03 ENCOUNTER — Ambulatory Visit: Payer: Medicare HMO | Admitting: Podiatry

## 2023-01-03 ENCOUNTER — Other Ambulatory Visit: Payer: Self-pay | Admitting: Podiatry

## 2023-01-03 ENCOUNTER — Ambulatory Visit (INDEPENDENT_AMBULATORY_CARE_PROVIDER_SITE_OTHER): Payer: Medicare HMO

## 2023-01-03 ENCOUNTER — Encounter: Payer: Self-pay | Admitting: Podiatry

## 2023-01-03 DIAGNOSIS — M7751 Other enthesopathy of right foot: Secondary | ICD-10-CM

## 2023-01-04 ENCOUNTER — Emergency Department (HOSPITAL_BASED_OUTPATIENT_CLINIC_OR_DEPARTMENT_OTHER)
Admission: EM | Admit: 2023-01-04 | Discharge: 2023-01-04 | Disposition: A | Discharge status: Home / Self Care | Payer: Medicare HMO | Attending: Emergency Medicine | Admitting: Emergency Medicine

## 2023-01-04 ENCOUNTER — Other Ambulatory Visit: Payer: Self-pay | Admitting: *Deleted

## 2023-01-04 ENCOUNTER — Emergency Department (HOSPITAL_BASED_OUTPATIENT_CLINIC_OR_DEPARTMENT_OTHER): Payer: Medicare HMO

## 2023-01-04 ENCOUNTER — Other Ambulatory Visit: Payer: Self-pay

## 2023-01-04 ENCOUNTER — Encounter (HOSPITAL_BASED_OUTPATIENT_CLINIC_OR_DEPARTMENT_OTHER): Payer: Self-pay

## 2023-01-04 DIAGNOSIS — I739 Peripheral vascular disease, unspecified: Secondary | ICD-10-CM

## 2023-01-04 DIAGNOSIS — Z7982 Long term (current) use of aspirin: Secondary | ICD-10-CM | POA: Diagnosis not present

## 2023-01-04 DIAGNOSIS — N3001 Acute cystitis with hematuria: Secondary | ICD-10-CM | POA: Diagnosis not present

## 2023-01-04 DIAGNOSIS — Z8521 Personal history of malignant neoplasm of larynx: Secondary | ICD-10-CM | POA: Insufficient documentation

## 2023-01-04 DIAGNOSIS — I10 Essential (primary) hypertension: Secondary | ICD-10-CM | POA: Diagnosis not present

## 2023-01-04 DIAGNOSIS — R310 Gross hematuria: Secondary | ICD-10-CM | POA: Diagnosis present

## 2023-01-04 DIAGNOSIS — Z79899 Other long term (current) drug therapy: Secondary | ICD-10-CM | POA: Diagnosis not present

## 2023-01-04 LAB — CBC WITH DIFFERENTIAL/PLATELET
Abs Immature Granulocytes: 0.03 10*3/uL (ref 0.00–0.07)
Basophils Absolute: 0 10*3/uL (ref 0.0–0.1)
Basophils Relative: 0 %
Eosinophils Absolute: 0 10*3/uL (ref 0.0–0.5)
Eosinophils Relative: 0 %
HCT: 35.7 % — ABNORMAL LOW (ref 39.0–52.0)
Hemoglobin: 12.1 g/dL — ABNORMAL LOW (ref 13.0–17.0)
Immature Granulocytes: 0 %
Lymphocytes Relative: 10 %
Lymphs Abs: 0.7 10*3/uL (ref 0.7–4.0)
MCH: 30.4 pg (ref 26.0–34.0)
MCHC: 33.9 g/dL (ref 30.0–36.0)
MCV: 89.7 fL (ref 80.0–100.0)
Monocytes Absolute: 0.4 10*3/uL (ref 0.1–1.0)
Monocytes Relative: 5 %
Neutro Abs: 6.1 10*3/uL (ref 1.7–7.7)
Neutrophils Relative %: 85 %
Platelets: 177 10*3/uL (ref 150–400)
RBC: 3.98 MIL/uL — ABNORMAL LOW (ref 4.22–5.81)
RDW: 13.2 % (ref 11.5–15.5)
WBC: 7.2 10*3/uL (ref 4.0–10.5)
nRBC: 0 % (ref 0.0–0.2)

## 2023-01-04 LAB — COMPREHENSIVE METABOLIC PANEL
ALT: 11 U/L (ref 0–44)
AST: 27 U/L (ref 15–41)
Albumin: 3.7 g/dL (ref 3.5–5.0)
Alkaline Phosphatase: 84 U/L (ref 38–126)
Anion gap: 9 (ref 5–15)
BUN: 15 mg/dL (ref 8–23)
CO2: 24 mmol/L (ref 22–32)
Calcium: 8.6 mg/dL — ABNORMAL LOW (ref 8.9–10.3)
Chloride: 103 mmol/L (ref 98–111)
Creatinine, Ser: 0.9 mg/dL (ref 0.61–1.24)
GFR, Estimated: >60 mL/min (ref >60)
Glucose, Bld: 113 mg/dL — ABNORMAL HIGH (ref 70–99)
Potassium: 4.2 mmol/L (ref 3.5–5.1)
Sodium: 136 mmol/L (ref 135–145)
Total Bilirubin: 0.9 mg/dL (ref 0.3–1.2)
Total Protein: 6.5 g/dL (ref 6.5–8.1)

## 2023-01-04 LAB — URINALYSIS, MICROSCOPIC (REFLEX)
Bacteria, UA: NONE SEEN
RBC / HPF: >50 RBC/hpf (ref 0–5)
WBC, UA: >50 WBC/hpf (ref 0–5)

## 2023-01-04 LAB — URINALYSIS, ROUTINE W REFLEX MICROSCOPIC

## 2023-01-04 LAB — PROTIME-INR
INR: 1 (ref 0.8–1.2)
Prothrombin Time: 13.3 seconds (ref 11.4–15.2)

## 2023-01-04 LAB — LIPASE, BLOOD: Lipase: 38 U/L (ref 11–51)

## 2023-01-04 MED ORDER — PHENAZOPYRIDINE HCL 200 MG PO TABS: 200.0000 mg | ORAL_TABLET | Freq: Three times a day (TID) | ORAL | 0 refills | Status: AC | PRN

## 2023-01-04 MED ORDER — CIPROFLOXACIN HCL 500 MG PO TABS
500.0000 mg | ORAL_TABLET | Freq: Once | ORAL | Status: AC
  Administered 2023-01-04: 500 mg via ORAL
  Filled 2023-01-04: qty 1

## 2023-01-04 MED ORDER — CIPROFLOXACIN HCL 500 MG PO TABS: 500.0000 mg | ORAL_TABLET | Freq: Two times a day (BID) | ORAL | 0 refills | Status: AC

## 2023-01-04 NOTE — ED Provider Notes (Signed)
[place] EMERGENCY DEPARTMENT AT [facility] Provider Note   CSN: [id number] Arrival date & time: 01/04/23  1617     History  Chief Complaint  Patient presents with   Hematuria    Dean Mayer is a 82 y.o. male with past medical history hypertension, GERD, throat cancer in remission who presents to the ED complaining of gross hematuria that started this morning.  He takes a 325 mg aspirin daily but no other anticoagulants.  No other areas of bleeding.  He complains of associated suprapubic abdominal pain.  Believes that he has a UTI.  History of UTIs before with the last in the fall 2023.  Notes that he was recently treated for right leg cellulitis but is not currently on antibiotics.  No associated fever, chills, nausea, vomiting, diarrhea, body aches, back pain, or other complaints.  Remote history of kidney stones. No history of bleeding disorders.       Home Medications Prior to Admission medications   Medication Sig Start Date End Date Taking? Authorizing Provider  ciprofloxacin (CIPRO) 500 MG tablet Take 1 tablet (500 mg total) by mouth every 12 (twelve) hours for 10 days. 01/04/23 01/14/23 Yes Loxley Cibrian L, PA-C  phenazopyridine (PYRIDIUM) 200 MG tablet Take 1 tablet (200 mg total) by mouth 3 (three) times daily as needed for up to 3 days for pain. 01/04/23 01/07/23 Yes Griffey Nicasio L, PA-C  acetaminophen (TYLENOL) 500 MG tablet Take 1,000 mg by mouth 2 (two) times daily as needed for mild pain or moderate pain.    [provider]  amLODipine (NORVASC) 10 MG tablet Take 10 mg by mouth daily.    [provider]  aspirin EC 325 MG tablet Take 325 mg by mouth daily.    [provider]  atorvastatin (LIPITOR) 40 MG tablet Take 40 mg by mouth every evening.    [provider]  brimonidine (ALPHAGAN) 0.2 % ophthalmic solution Place 1 drop into the right eye 2 (two) times daily.    [provider]  fluticasone (FLONASE) 50  MCG/ACT nasal spray Place 2 sprays into the nose daily as needed for allergies.    [provider]  gabapentin (NEURONTIN) 300 MG capsule Take 300 mg by mouth at bedtime. 12/13/22   [provider]  levothyroxine (SYNTHROID) 100 MCG tablet Take 100 mcg by mouth daily.    [provider]  lisinopril (ZESTRIL) 5 MG tablet Take 5 mg by mouth 2 (two) times daily.    [provider]  Multiple Vitamins-Minerals (ONE-A-DAY 50 PLUS PO) Take 1 tablet by mouth daily.    [provider]  Omega-3 Fatty Acids (FISH OIL) 1200 MG CAPS Take 1,200 mg by mouth daily.    [provider]  tamsulosin (FLOMAX) 0.4 MG CAPS capsule Take 0.4 mg by mouth daily.    [provider]  traMADol (ULTRAM) 50 MG tablet Take 50 mg by mouth 2 (two) times daily as needed for severe pain.    [provider]      Allergies    Patient has no known allergies.    Review of Systems   Review of Systems  All other systems reviewed and are negative.   Physical Exam Updated Vital Signs BP (!) 146/72   Pulse 72   Temp 97.8 F (36.6 C) (Oral)   Resp 18   Ht 6\' 4"  (1.93 m)   Wt 116.1 kg   SpO2 97%   BMI 31.16 kg/m  Physical  Exam Vitals and nursing note reviewed.  Constitutional:      General: He is not in acute distress.    Appearance: Normal appearance. He is not ill-appearing, toxic-appearing or diaphoretic.  HENT:     Head: Normocephalic and atraumatic.     Mouth/Throat:     Mouth: Mucous membranes are moist.  Eyes:     Conjunctiva/sclera: Conjunctivae normal.  Cardiovascular:     Rate and Rhythm: Normal rate and regular rhythm.     Heart sounds: No murmur heard. Pulmonary:     Effort: Pulmonary effort is normal.     Breath sounds: Normal breath sounds.  Abdominal:     General: Abdomen is flat.     Palpations: Abdomen is soft. There is no mass.     Tenderness: There is no abdominal tenderness. There is no right CVA tenderness, left CVA  tenderness, guarding or rebound.  Musculoskeletal:        General: Normal range of motion.     Cervical back: Neck supple.     Right lower leg: No edema.     Left lower leg: No edema.  Skin:    General: Skin is warm and dry.     Capillary Refill: Capillary refill takes less than 2 seconds.  Neurological:     Mental Status: He is alert. Mental status is at baseline.  Psychiatric:        Behavior: Behavior normal.     ED Results / Procedures / Treatments   Labs (all labs ordered are listed, but only abnormal results are displayed) Labs Reviewed  URINALYSIS, ROUTINE W REFLEX MICROSCOPIC - Abnormal; Notable for the following components:      Result Value   Color, Urine RED (*)    APPearance HAZY (*)    Glucose, UA   (*)    Value: TEST NOT REPORTED DUE TO COLOR INTERFERENCE OF URINE PIGMENT   Hgb urine dipstick   (*)    Value: TEST NOT REPORTED DUE TO COLOR INTERFERENCE OF URINE PIGMENT   Bilirubin Urine   (*)    Value: TEST NOT REPORTED DUE TO COLOR INTERFERENCE OF URINE PIGMENT   Ketones, ur   (*)    Value: TEST NOT REPORTED DUE TO COLOR INTERFERENCE OF URINE PIGMENT   Protein, ur   (*)    Value: TEST NOT REPORTED DUE TO COLOR INTERFERENCE OF URINE PIGMENT   Nitrite   (*)    Value: TEST NOT REPORTED DUE TO COLOR INTERFERENCE OF URINE PIGMENT   Leukocytes,Ua   (*)    Value: TEST NOT REPORTED DUE TO COLOR INTERFERENCE OF URINE PIGMENT   All other components within normal limits  CBC WITH DIFFERENTIAL/PLATELET - Abnormal; Notable for the following components:   RBC 3.98 (*)    Hemoglobin 12.1 (*)    HCT 35.7 (*)    All other components within normal limits  COMPREHENSIVE METABOLIC PANEL - Abnormal; Notable for the following components:   Glucose, Bld 113 (*)    Calcium 8.6 (*)    All other components within normal limits  LIPASE, BLOOD  PROTIME-INR  URINALYSIS, MICROSCOPIC (REFLEX)    EKG None  Radiology CT ABDOMEN PELVIS WO CONTRAST  Result Date:  01/04/2023 CLINICAL DATA:  Abdominal and flank pain beginning this morning. Hematuria and dysuria. EXAM: CT ABDOMEN AND PELVIS WITHOUT CONTRAST TECHNIQUE: Multidetector CT imaging of the abdomen and pelvis was performed following the standard protocol without IV contrast. RADIATION DOSE REDUCTION: This exam was performed according to  the departmental dose-optimization program which includes automated exposure control, adjustment of the mA and/or kV according to patient size and/or use of iterative reconstruction technique. COMPARISON:  None Available. FINDINGS: Lower chest: No acute findings. Hepatobiliary: No mass visualized on this unenhanced exam. Gallbladder is unremarkable. No evidence of biliary ductal dilatation. Pancreas: No mass or inflammatory process visualized on this unenhanced exam. Spleen:  Within normal limits in size. Adrenals/Urinary tract: A few renal calculi are seen bilaterally, largest in the right kidney measuring 6 mm. No evidence of ureteral calculi or hydronephrosis. Urinary bladder is nondilated, but shows diffuse bladder wall thickening, suspicious for cystitis. Stomach/Bowel: No evidence of obstruction, inflammatory process, or abnormal fluid collections. Vascular/Lymphatic: No pathologically enlarged lymph nodes identified. No evidence of abdominal aortic aneurysm. Aortic atherosclerotic calcification incidentally noted. Reproductive:  No mass or other significant abnormality. Other:  None. Musculoskeletal:  No suspicious bone lesions identified. IMPRESSION: Bilateral nephrolithiasis. No evidence of ureteral calculi or hydronephrosis. Diffuse bladder wall thickening, suspicious for cystitis. Recommend correlation with urinalysis. Electronically Signed   By: Danae Orleans M.D.   On: 01/04/2023 18:55   DG Ankle Complete Right  Result Date: 01/03/2023 Please see detailed radiograph report in office note.   Procedures Procedures    Medications Ordered in ED Medications   ciprofloxacin (CIPRO) tablet 500 mg (500 mg Oral Given 01/04/23 1928)    ED Course/ Medical Decision Making/ A&P                             Medical Decision Making Amount and/or Complexity of Data Reviewed Labs: ordered. Decision-making details documented in ED Course. Radiology: ordered. Decision-making details documented in ED Course.  Risk Prescription drug management.   Medical Decision Making:   Dean Mayer is a 82 y.o. male who presented to the ED today with hematuria detailed above.    Additional history discussed with patient's family/caregivers.  Patient's presentation is complicated by their history of advanced age, daily aspirin, kidney stones, HTN.  Complete initial physical exam performed, notably the patient was in NAD. Abdomen soft, nontender. No CVA tenderness.    Reviewed and confirmed nursing documentation for past medical history, family history, social history.    Initial Assessment:   With the patient's presentation of hematuria, differential diagnosis includes but is not limited to acute cystitis, pyelonephritis, ureterolithiasis, nephrolithiasis, bladder outlet obstruction, anemia, acute kidney injury, prostatitis.  This is most consistent with an acute complicated illness  Initial Plan:  Screening labs including CBC and Metabolic panel to evaluate for infectious or metabolic etiology of disease.  Urinalysis with reflex culture ordered to evaluate for UTI or relevant urologic/nephrologic pathology.  Lipase to evaluate for pancreatitis PT-INR to evaluate for prolonged bleeding Objective evaluation as below reviewed   Initial Study Results:   Laboratory  All laboratory results reviewed without evidence of clinically relevant pathology.   Exceptions include: Hemoglobin 12.1, UA grossly positive for blood and greater than 50 white blood cells  Radiology:  All images reviewed independently. Agree with radiology report at this time.   CT ABDOMEN  PELVIS WO CONTRAST  Result Date: 01/04/2023 CLINICAL DATA:  Abdominal and flank pain beginning this morning. Hematuria and dysuria. EXAM: CT ABDOMEN AND PELVIS WITHOUT CONTRAST TECHNIQUE: Multidetector CT imaging of the abdomen and pelvis was performed following the standard protocol without IV contrast. RADIATION DOSE REDUCTION: This exam was performed according to the departmental dose-optimization program which includes automated exposure control, adjustment of the  mA and/or kV according to patient size and/or use of iterative reconstruction technique. COMPARISON:  None Available. FINDINGS: Lower chest: No acute findings. Hepatobiliary: No mass visualized on this unenhanced exam. Gallbladder is unremarkable. No evidence of biliary ductal dilatation. Pancreas: No mass or inflammatory process visualized on this unenhanced exam. Spleen:  Within normal limits in size. Adrenals/Urinary tract: A few renal calculi are seen bilaterally, largest in the right kidney measuring 6 mm. No evidence of ureteral calculi or hydronephrosis. Urinary bladder is nondilated, but shows diffuse bladder wall thickening, suspicious for cystitis. Stomach/Bowel: No evidence of obstruction, inflammatory process, or abnormal fluid collections. Vascular/Lymphatic: No pathologically enlarged lymph nodes identified. No evidence of abdominal aortic aneurysm. Aortic atherosclerotic calcification incidentally noted. Reproductive:  No mass or other significant abnormality. Other:  None. Musculoskeletal:  No suspicious bone lesions identified. IMPRESSION: Bilateral nephrolithiasis. No evidence of ureteral calculi or hydronephrosis. Diffuse bladder wall thickening, suspicious for cystitis. Recommend correlation with urinalysis. Electronically Signed   By: Danae Orleans M.D.   On: 01/04/2023 18:55   DG Ankle Complete Right  Result Date: 01/03/2023 Please see detailed radiograph report in office note.     Final Assessment and Plan:   82 year old  male presents to the ED for evaluation of hematuria and suprapubic pain.  Believes he has urinary tract infection.  Remote history of kidney stones as well.  Recently treated for cellulitis of the right leg but this is improving.  No fevers or vomiting.  Patient nontoxic-appearing.  Workup initiated as above for further assessment.  UA is positive for greater than 50 white blood cells.  CT without evidence of ureteral calculi or hydronephrosis, findings suspicious for cystitis.  Will treat with antibiotics at home and have follow-up with urology.  Patient and wife expressed understanding of plan.  Strict ED return precautions given, all questions answered, and stable for discharge.   Clinical Impression:  1. Acute cystitis with hematuria      Discharge           Final Clinical Impression(s) / ED Diagnoses Final diagnoses:  Acute cystitis with hematuria    Rx / DC Orders ED Discharge Orders          Ordered    ciprofloxacin (CIPRO) 500 MG tablet  Every 12 hours        01/04/23 1918    phenazopyridine (PYRIDIUM) 200 MG tablet  3 times daily PRN        01/04/23 1925              Richardson Dopp 01/04/23 2347    Rondel Baton, MD 01/07/23 [phone]

## 2023-01-04 NOTE — ED Notes (Signed)
Reviewed AVS with patient, patient expressed understanding of directions, denies further questions at this time. 

## 2023-01-04 NOTE — Discharge Instructions (Signed)
Thank you for letting us take care of you today.  Overall, your blood work and CT scan was reassuring. Your urine does look infected. I am prescribing antibiotics to treat this.   I recommend following up with urology.  I provided our on-call urologist for you to follow-up with.  Please follow-up with either urology and/or your PCP within the next week for reevaluation and recheck of your urine.  For any new or worsening symptoms including significant abdominal pain, vomiting, signs of dehydration, fever, or other new, concerning symptoms, please return to the nearest ED for reevaluation.

## 2023-01-04 NOTE — ED Triage Notes (Signed)
Patient here POV from Home.  Endorses Dysuria and Hematuria with Clots since 0700 today. No Known Fevers. No N/V.   NAD Noted during triage. A&Ox4. GCS 15. Ambulatory

## 2023-01-05 NOTE — Progress Notes (Signed)
Subjective:  Patient ID: Dean Mayer, male    DOB: 21-Dec-1940,  MRN: [id number] HPI Chief Complaint  Patient presents with   Ankle Pain    Ankle right - 8 weeks ago ankle and foot got swollen, red and extremely painful, saw PCP-thought gout, put on meds, no improvement, went to ER-thought cellulitis, Rx'd doxycycline-took for 4 days, no improvement, went back to ER-admitted-gave IV antibiotics, foot improved some, but still swollen, red and some soreness, xrays done 11/26/22   New Patient (Initial Visit)    82 y.o. male presents with the above complaint.   ROS: Nausea or vomiting chest pain shortness of breath.  Does relate night sweats.  Past Medical History:  Diagnosis Date   Eye abnormalities    GERD (gastroesophageal reflux disease)    Hypertension    Kidney stones    Throat cancer [facility])    Past Surgical History:  Procedure Laterality Date   EYE SURGERY     TONSILLECTOMY      Current Outpatient Medications:    gabapentin (NEURONTIN) 300 MG capsule, Take 300 mg by mouth at bedtime., Disp: , Rfl:    acetaminophen (TYLENOL) 500 MG tablet, Take 1,000 mg by mouth 2 (two) times daily as needed for mild pain or moderate pain., Disp: , Rfl:    amLODipine (NORVASC) 10 MG tablet, Take 10 mg by mouth daily., Disp: , Rfl:    aspirin EC 325 MG tablet, Take 325 mg by mouth daily., Disp: , Rfl:    atorvastatin (LIPITOR) 40 MG tablet, Take 40 mg by mouth every evening., Disp: , Rfl:    brimonidine (ALPHAGAN) 0.2 % ophthalmic solution, Place 1 drop into the right eye 2 (two) times daily., Disp: , Rfl:    ciprofloxacin (CIPRO) 500 MG tablet, Take 1 tablet (500 mg total) by mouth every 12 (twelve) hours for 10 days., Disp: 20 tablet, Rfl: 0   fluticasone (FLONASE) 50 MCG/ACT nasal spray, Place 2 sprays into the nose daily as needed for allergies., Disp: , Rfl:    levothyroxine (SYNTHROID) 100 MCG tablet, Take 100 mcg by mouth daily., Disp: , Rfl:    lisinopril (ZESTRIL) 5 MG tablet, Take  5 mg by mouth 2 (two) times daily., Disp: , Rfl:    Multiple Vitamins-Minerals (ONE-A-DAY 50 PLUS PO), Take 1 tablet by mouth daily., Disp: , Rfl:    Omega-3 Fatty Acids (FISH OIL) 1200 MG CAPS, Take 1,200 mg by mouth daily., Disp: , Rfl:    phenazopyridine (PYRIDIUM) 200 MG tablet, Take 1 tablet (200 mg total) by mouth 3 (three) times daily as needed for up to 3 days for pain., Disp: 6 tablet, Rfl: 0   tamsulosin (FLOMAX) 0.4 MG CAPS capsule, Take 0.4 mg by mouth daily., Disp: , Rfl:    traMADol (ULTRAM) 50 MG tablet, Take 50 mg by mouth 2 (two) times daily as needed for severe pain., Disp: , Rfl:   No Known Allergies Review of Systems Objective:  There were no vitals filed for this visit.  General: Well developed, nourished, in no acute distress, alert and oriented x3   Dermatological: Skin is warm, dry and supple bilateral. Nails x 10 are well maintained; remaining integument appears unremarkable at this time. There are no open sores, no preulcerative lesions, no rash or signs of infection present.  Vascular: Dorsalis Pedis artery and Posterior Tibial artery pedal pulses are 2/4 bilateral with immedate capillary fill time. Pedal hair growth present. No varicosities and no lower extremity edema present bilateral.  Neruologic: Grossly intact via light touch bilateral. Vibratory intact via tuning fork bilateral. Protective threshold with Semmes Wienstein monofilament intact to all pedal sites bilateral. Patellar and Achilles deep tendon reflexes 2+ bilateral. No Babinski or clonus noted bilateral.   Musculoskeletal: No gross boney pedal deformities bilateral. No pain, crepitus, or limitation noted with foot and ankle range of motion bilateral. Muscular strength 5/5 in all groups tested bilateral.  Right ankle demonstrates edema and erythema with pain on palpation of the inversion of the foot along the posterior tibial tendon states that not only does it feel superficial but it feels deep.  This  area is warm to the touch.  Has a history of gout.  Previous labs next weight rays were reviewed today from hospital visits  Gait: Unassisted, antalgic right   Radiographs:  Radiographs reviewed today from hospital visits do not demonstrate any significant osseous abnormalities of the ankle.  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  Cannot rule out septic joint and/or cellulitic process  Plan: Requested a significant amount of blood work today consisting of CBC CMP arthritic panel with an ANA sed rate and a CRP     Avyonna Wagoner T. [place], [place]

## 2023-01-06 LAB — CBC WITH DIFFERENTIAL/PLATELET
Absolute Monocytes: 388 cells/uL (ref 200–950)
Basophils Absolute: 11 cells/uL (ref 0–200)
Basophils Relative: 0.2 %
Eosinophils Absolute: 40 cells/uL (ref 15–500)
Eosinophils Relative: 0.7 %
HCT: 36.9 % — ABNORMAL LOW (ref 38.5–50.0)
Hemoglobin: 12.2 g/dL — ABNORMAL LOW (ref 13.2–17.1)
Lymphs Abs: 1317 cells/uL (ref 850–3900)
MCH: 29.5 pg (ref 27.0–33.0)
MCHC: 33.1 g/dL (ref 32.0–36.0)
MCV: 89.3 fL (ref 80.0–100.0)
MPV: 8.9 fL (ref 7.5–12.5)
Monocytes Relative: 6.8 %
Neutro Abs: 3944 cells/uL (ref 1500–7800)
Neutrophils Relative %: 69.2 %
Platelets: 223 10*3/uL (ref 140–400)
RBC: 4.13 10*6/uL — ABNORMAL LOW (ref 4.20–5.80)
RDW: 12.7 % (ref 11.0–15.0)
Total Lymphocyte: 23.1 %
WBC: 5.7 10*3/uL (ref 3.8–10.8)

## 2023-01-06 LAB — COMPREHENSIVE METABOLIC PANEL
AG Ratio: 1.3 (calc) (ref 1.0–2.5)
ALT: 12 U/L (ref 9–46)
AST: 17 U/L (ref 10–35)
Albumin: 3.6 g/dL (ref 3.6–5.1)
Alkaline phosphatase (APISO): 100 U/L (ref 35–144)
BUN: 18 mg/dL (ref 7–25)
CO2: 26 mmol/L (ref 20–32)
Calcium: 9.1 mg/dL (ref 8.6–10.3)
Chloride: 104 mmol/L (ref 98–110)
Creat: 1.08 mg/dL (ref 0.70–1.28)
Globulin: 2.8 g/dL (calc) (ref 1.9–3.7)
Glucose, Bld: 104 mg/dL (ref 65–139)
Potassium: 4.1 mmol/L (ref 3.5–5.3)
Sodium: 138 mmol/L (ref 135–146)
Total Bilirubin: 0.7 mg/dL (ref 0.2–1.2)
Total Protein: 6.4 g/dL (ref 6.1–8.1)

## 2023-01-06 LAB — RHEUMATOID FACTOR: Rheumatoid fact SerPl-aCnc: 54 IU/mL — ABNORMAL HIGH (ref <14)

## 2023-01-06 LAB — URIC ACID: Uric Acid, Serum: 7.5 mg/dL (ref 4.0–8.0)

## 2023-01-06 LAB — C-REACTIVE PROTEIN: CRP: 20.8 mg/L — ABNORMAL HIGH (ref <8.0)

## 2023-01-06 LAB — ANTI-NUCLEAR AB-TITER (ANA TITER): ANA Titer 1: 1:80 {titer} — ABNORMAL HIGH

## 2023-01-06 LAB — SEDIMENTATION RATE: Sed Rate: 62 mm/h — ABNORMAL HIGH (ref 0–20)

## 2023-01-06 LAB — ANA: Anti Nuclear Antibody (ANA): POSITIVE — AB

## 2023-01-08 ENCOUNTER — Telehealth: Payer: Self-pay | Admitting: *Deleted

## 2023-01-08 DIAGNOSIS — R768 Other specified abnormal immunological findings in serum: Secondary | ICD-10-CM

## 2023-01-08 NOTE — Telephone Encounter (Signed)
-----   Message from Elinor Parkinson, [place] sent at 01/07/2023 10:49 AM EDT ----- He needs to see Rheumatology ASAP. Does not look like infection. 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  Please inform the patient that you will be putting him in contact with a Rheumatologist.

## 2023-01-10 NOTE — Progress Notes (Signed)
Office Note     CC:  follow up Requesting Provider:  Margarita Rana Harlan Stains, MD  HPI: Dean Mayer is a 82 y.o. (July 04, 1941) male who presents for hospital follow up. He was seen on 12/05/22 as a consult for right lower extremity cellulitis with some concerns about poor arterial flow in his right leg contributing to the cellulitis. However, he had very brisk doppler signals on exam and ABIs that were essentially normal.   Today he presents with his wife. His legs are greatly improved since his hospitalization. He says he still has swelling in right leg> left. This usually is improved upon first waking but increases as day goes on. He elevates in recliner intermittently. He says he was wearing thigh high compression stockings but these were stretched out and also he says he was not told to continue wearing them. Patient and his wife report that they are still waiting on results for why his ankle continues to stay swollen and sore. They are scheduled to see a Rheumatologist soon. He otherwise denies any pain on ambulation or rest. No non healing wounds. He was a former smoker over 20 years ago. He is compliant with Aspirin and statin.   Past Medical History:  Diagnosis Date   Eye abnormalities    GERD (gastroesophageal reflux disease)    Hypertension    Kidney stones    Throat cancer [facility])     Past Surgical History:  Procedure Laterality Date   EYE SURGERY     TONSILLECTOMY      Social History   Socioeconomic History   Marital status: Married    Spouse name: Not on file   Number of children: Not on file   Years of education: Not on file   Highest education level: Not on file  Occupational History   Not on file  Tobacco Use   Smoking status: Former   Smokeless tobacco: Never  Substance and Sexual Activity   Alcohol use: No   Drug use: No   Sexual activity: Not on file  Other Topics Concern   Not on file  Social History Narrative   Not on file   Social Determinants of  Health   Financial Resource Strain: Not on file  Food Insecurity: No Food Insecurity (12/01/2022)   Hunger Vital Sign    Worried About Running Out of Food in the Last Year: Never true    Ran Out of Food in the Last Year: Never true  Transportation Needs: No Transportation Needs (12/01/2022)   PRAPARE - Administrator, Civil Service (Medical): No    Lack of Transportation (Non-Medical): No  Physical Activity: Not on file  Stress: Not on file  Social Connections: Not on file  Intimate Partner Violence: Unknown (12/01/2022)   Humiliation, Afraid, Rape, and Kick questionnaire    Fear of Current or Ex-Partner: No    Emotionally Abused: Patient declined    Physically Abused: No    Sexually Abused: No    Family History  Problem Relation Age of Onset   Stroke Mother    Heart attack Mother     Current Outpatient Medications  Medication Sig Dispense Refill   acetaminophen (TYLENOL) 500 MG tablet Take 1,000 mg by mouth 2 (two) times daily as needed for mild pain or moderate pain.     amLODipine (NORVASC) 10 MG tablet Take 10 mg by mouth daily.     aspirin EC 325 MG tablet Take 325 mg by mouth daily.  atorvastatin (LIPITOR) 40 MG tablet Take 40 mg by mouth every evening.     brimonidine (ALPHAGAN) 0.2 % ophthalmic solution Place 1 drop into the right eye 2 (two) times daily.     ciprofloxacin (CIPRO) 500 MG tablet Take 1 tablet (500 mg total) by mouth every 12 (twelve) hours for 10 days. 20 tablet 0   fluticasone (FLONASE) 50 MCG/ACT nasal spray Place 2 sprays into the nose daily as needed for allergies.     levothyroxine (SYNTHROID) 100 MCG tablet Take 100 mcg by mouth daily.     lisinopril (ZESTRIL) 5 MG tablet Take 5 mg by mouth 2 (two) times daily.     Multiple Vitamins-Minerals (ONE-A-DAY 50 PLUS PO) Take 1 tablet by mouth daily.     Omega-3 Fatty Acids (FISH OIL) 1200 MG CAPS Take 1,200 mg by mouth daily.     tamsulosin (FLOMAX) 0.4 MG CAPS capsule Take 0.4 mg by mouth  daily.     No current facility-administered medications for this visit.    No Known Allergies   REVIEW OF SYSTEMS:   [X]  denotes positive finding, [ ]  denotes negative finding Cardiac  Comments:  Chest pain or chest pressure:    Shortness of breath upon exertion:    Short of breath when lying flat:    Irregular heart rhythm:        Vascular    Pain in calf, thigh, or hip brought on by ambulation:    Pain in feet at night that wakes you up from your sleep:     Blood clot in your veins:    Leg swelling:  X       Pulmonary    Oxygen at home:    Productive cough:     Wheezing:         Neurologic    Sudden weakness in arms or legs:     Sudden numbness in arms or legs:     Sudden onset of difficulty speaking or slurred speech:    Temporary loss of vision in one eye:     Problems with dizziness:         Gastrointestinal    Blood in stool:     Vomited blood:         Genitourinary    Burning when urinating:     Blood in urine:        Psychiatric    Major depression:         Hematologic    Bleeding problems:    Problems with blood clotting too easily:        Skin    Rashes or ulcers:        Constitutional    Fever or chills:      PHYSICAL EXAMINATION:  Vitals:   01/14/23 1448  BP: 138/75  Pulse: 66  Temp: 98 F (36.7 C)  TempSrc: Temporal  SpO2: 96%  Weight: 245 lb (111.1 kg)    General:  WDWN in NAD; vital signs documented above Gait: Normal HENT: WNL, normocephalic Pulmonary: normal non-labored breathing , without wheezing Cardiac: regular HR Abdomen: soft Vascular Exam/Pulses: 2+ DP  pulses bilaterally, 2+ PT pulse on RLE, feet warm and well perfused. Bilateral edema R > L Extremities: without ischemic changes, without Gangrene , without cellulitis; without open wounds;  Musculoskeletal: no muscle wasting or atrophy  Neurologic: A&O X 3 Psychiatric:  The pt has Normal affect.   Non-Invasive Vascular Imaging:    +-------+-----------+-----------+------------+------------+  ABI/TBIToday's ABIToday's TBIPrevious ABIPrevious  TBI  +-------+-----------+-----------+------------+------------+  Right 1.11       0.45       [place]          0.43          +-------+-----------+-----------+------------+------------+  Left  0.93       0.52       [place]          0.63          +-------+-----------+-----------+------------+------------+   +-----------+--------+-----+--------+----------+--------+  RIGHT     PSV cm/sRatioStenosisWaveform  Comments  +-----------+--------+-----+--------+----------+--------+  CFA Distal 142                  triphasic           +-----------+--------+-----+--------+----------+--------+  DFA       206                  biphasic            +-----------+--------+-----+--------+----------+--------+  SFA Prox   193                  triphasic           +-----------+--------+-----+--------+----------+--------+  SFA Mid    135                  triphasic           +-----------+--------+-----+--------+----------+--------+  SFA Distal 109                  biphasic            +-----------+--------+-----+--------+----------+--------+  POP Prox   123                  triphasic           +-----------+--------+-----+--------+----------+--------+  POP Distal 111                  triphasic           +-----------+--------+-----+--------+----------+--------+  ATA Mid    78                   triphasic           +-----------+--------+-----+--------+----------+--------+  ATA Distal 53                   biphasic            +-----------+--------+-----+--------+----------+--------+  PTA Mid    68                   triphasic           +-----------+--------+-----+--------+----------+--------+  PTA Distal 22                   monophasic          +-----------+--------+-----+--------+----------+--------+  PERO  Distal100                  biphasic            +-----------+--------+-----+--------+----------+--------+     Summary:  Right: No evidence of stenosis in the right lower extremity arterial system. Diffuse irregular plaque throughout the femoral and popliteal arteries    ASSESSMENT/PLAN:: 82 y.o. 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 He was seen at hospital with RLE cellulitis with concerns fo PAD. His ABI at the hospital as well as clinical exam suggested no significant arterial disease contributing to his cellulitis. No intervention was recommended. He has had resolution of his cellulitis. He still has bilateral  lower extremity swelling. - ABIs and arterial duplex suggest mild arterial disease on the right lower extremity. 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 I have recommended knee high 15- 20 mmHg compression stockings. He was measured and purchased 2 pairs at today's visit - Encourage proper elevation daily above level of the heart - Continue Aspirin, Statin  - He knows to follow up sooner should he have any new or concerning symptoms - He can follow up in 1 year with repeat ABI    Graceann Congress, PA-C [facility] [phone]  Clinic MD:   Myra Gianotti

## 2023-01-12 ENCOUNTER — Other Ambulatory Visit (HOSPITAL_BASED_OUTPATIENT_CLINIC_OR_DEPARTMENT_OTHER): Payer: Self-pay

## 2023-01-14 ENCOUNTER — Ambulatory Visit (HOSPITAL_COMMUNITY)
Admission: RE | Admit: 2023-01-14 | Discharge: 2023-01-14 | Disposition: A | Discharge status: Home / Self Care | Payer: Medicare HMO | Source: Ambulatory Visit | Attending: Surgery | Admitting: Surgery

## 2023-01-14 ENCOUNTER — Ambulatory Visit (INDEPENDENT_AMBULATORY_CARE_PROVIDER_SITE_OTHER)
Admission: RE | Admit: 2023-01-14 | Discharge: 2023-01-14 | Disposition: A | Discharge status: Home / Self Care | Payer: Medicare HMO | Source: Ambulatory Visit | Attending: Surgery | Admitting: Surgery

## 2023-01-14 ENCOUNTER — Ambulatory Visit: Payer: Medicare HMO | Admitting: Physician Assistant

## 2023-01-14 VITALS — BP 138/75 | HR 66 | Temp 98.0°F | Wt 245.0 lb

## 2023-01-14 DIAGNOSIS — M7989 Other specified soft tissue disorders: Secondary | ICD-10-CM

## 2023-01-14 DIAGNOSIS — I739 Peripheral vascular disease, unspecified: Secondary | ICD-10-CM

## 2023-01-14 DIAGNOSIS — I872 Venous insufficiency (chronic) (peripheral): Secondary | ICD-10-CM

## 2023-01-14 LAB — VAS US ABI WITH/WO TBI
Left ABI: 0.93
Right ABI: 1.11

## 2023-01-23 ENCOUNTER — Other Ambulatory Visit: Payer: Self-pay

## 2023-01-23 DIAGNOSIS — I739 Peripheral vascular disease, unspecified: Secondary | ICD-10-CM

## 2023-01-26 ENCOUNTER — Encounter (HOSPITAL_BASED_OUTPATIENT_CLINIC_OR_DEPARTMENT_OTHER): Payer: Self-pay

## 2023-01-26 ENCOUNTER — Emergency Department (HOSPITAL_BASED_OUTPATIENT_CLINIC_OR_DEPARTMENT_OTHER): Payer: Medicare HMO

## 2023-01-26 ENCOUNTER — Emergency Department (HOSPITAL_BASED_OUTPATIENT_CLINIC_OR_DEPARTMENT_OTHER)
Admission: EM | Admit: 2023-01-26 | Discharge: 2023-01-27 | Disposition: A | Discharge status: Home / Self Care | Payer: Medicare HMO | Attending: Emergency Medicine | Admitting: Emergency Medicine

## 2023-01-26 DIAGNOSIS — Z7982 Long term (current) use of aspirin: Secondary | ICD-10-CM | POA: Diagnosis not present

## 2023-01-26 DIAGNOSIS — R319 Hematuria, unspecified: Secondary | ICD-10-CM | POA: Diagnosis present

## 2023-01-26 DIAGNOSIS — M545 Low back pain, unspecified: Secondary | ICD-10-CM | POA: Insufficient documentation

## 2023-01-26 DIAGNOSIS — N3001 Acute cystitis with hematuria: Secondary | ICD-10-CM | POA: Insufficient documentation

## 2023-01-26 DIAGNOSIS — R918 Other nonspecific abnormal finding of lung field: Secondary | ICD-10-CM

## 2023-01-26 LAB — COMPREHENSIVE METABOLIC PANEL
ALT: 12 U/L (ref 0–44)
AST: 17 U/L (ref 15–41)
Albumin: 3.9 g/dL (ref 3.5–5.0)
Alkaline Phosphatase: 94 U/L (ref 38–126)
Anion gap: 9 (ref 5–15)
BUN: 17 mg/dL (ref 8–23)
CO2: 25 mmol/L (ref 22–32)
Calcium: 9 mg/dL (ref 8.9–10.3)
Chloride: 102 mmol/L (ref 98–111)
Creatinine, Ser: 0.91 mg/dL (ref 0.61–1.24)
GFR, Estimated: >60 mL/min (ref >60)
Glucose, Bld: 148 mg/dL — ABNORMAL HIGH (ref 70–99)
Potassium: 3.8 mmol/L (ref 3.5–5.1)
Sodium: 136 mmol/L (ref 135–145)
Total Bilirubin: 0.8 mg/dL (ref 0.3–1.2)
Total Protein: 6.9 g/dL (ref 6.5–8.1)

## 2023-01-26 LAB — URINALYSIS, ROUTINE W REFLEX MICROSCOPIC
Bacteria, UA: NONE SEEN
Bilirubin Urine: NEGATIVE
Glucose, UA: NEGATIVE mg/dL
Ketones, ur: NEGATIVE mg/dL
Nitrite: NEGATIVE
Protein, ur: 30 mg/dL — AB
Specific Gravity, Urine: 1.019 (ref 1.005–1.030)
WBC, UA: >50 WBC/hpf (ref 0–5)
pH: 6 (ref 5.0–8.0)

## 2023-01-26 LAB — CBC
HCT: 37 % — ABNORMAL LOW (ref 39.0–52.0)
Hemoglobin: 12.3 g/dL — ABNORMAL LOW (ref 13.0–17.0)
MCH: 29.4 pg (ref 26.0–34.0)
MCHC: 33.2 g/dL (ref 30.0–36.0)
MCV: 88.5 fL (ref 80.0–100.0)
Platelets: 174 10*3/uL (ref 150–400)
RBC: 4.18 MIL/uL — ABNORMAL LOW (ref 4.22–5.81)
RDW: 12.9 % (ref 11.5–15.5)
WBC: 5.4 10*3/uL (ref 4.0–10.5)
nRBC: 0 % (ref 0.0–0.2)

## 2023-01-26 LAB — LIPASE, BLOOD: Lipase: 21 U/L (ref 11–51)

## 2023-01-26 MED ORDER — CEFTRIAXONE SODIUM 500 MG IJ SOLR
500.0000 mg | Freq: Once | INTRAMUSCULAR | Status: AC
  Administered 2023-01-26: 500 mg via INTRAMUSCULAR
  Filled 2023-01-26: qty 500

## 2023-01-26 MED ORDER — CEPHALEXIN 500 MG PO CAPS: 500.0000 mg | ORAL_CAPSULE | Freq: Three times a day (TID) | ORAL | 0 refills | Status: AC

## 2023-01-26 MED ORDER — KETOROLAC TROMETHAMINE 30 MG/ML IJ SOLN
30.0000 mg | Freq: Once | INTRAMUSCULAR | Status: AC
  Administered 2023-01-26: 30 mg via INTRAMUSCULAR
  Filled 2023-01-26: qty 1

## 2023-01-26 MED ORDER — LIDOCAINE HCL (PF) 1 % IJ SOLN
1.0000 mL | Freq: Once | INTRAMUSCULAR | Status: AC
  Administered 2023-01-26: 1.5 mL
  Filled 2023-01-26: qty 5

## 2023-01-26 MED ORDER — OXYCODONE-ACETAMINOPHEN 5-325 MG PO TABS
1.0000 | ORAL_TABLET | Freq: Once | ORAL | Status: AC
  Administered 2023-01-26: 1 via ORAL
  Filled 2023-01-26: qty 1

## 2023-01-26 NOTE — ED Provider Triage Note (Deleted)
Emergency Medicine Provider Triage Evaluation Note  Dean Mayer , a 82 y.o. male  was evaluated in triage.  Pt complains of back pain. Patient admits to lower bilateral back pain x 2 night. Denies recent falls or injuries. Hx positive for ureithrolythiasis on 01/04/23 after presenting with hematuria. Now has decreased urination, abdominal bloating, and mild lower abdominal pain. Did not pass stone. Did not see urology yet. Did not have lithotripsy or stent placement. Admits to subjective fevers, and chills.   Review of Systems  Positive: Please see above Negative: Please see above  Physical Exam  BP 126/78 (BP Location: Right Arm)   Pulse 87   Temp 98.8 F (37.1 C) (Oral)   Resp 16   Ht 6\' 4"  (1.93 m)   Wt 111.1 kg   SpO2 100%   BMI 29.82 kg/m  Gen:   Awake, no distress   Resp:  Normal effort  MSK:   Moves extremities without difficulty  Other:    Medical Decision Making  Medically screening exam initiated at 10:13 PM.  Appropriate orders placed.  Dean Mayer was informed that the remainder of the evaluation will be completed by another provider, this initial triage assessment does not replace that evaluation, and the importance of remaining in the ED until their evaluation is complete.     Franne Forts, DO 01/26/23 2217

## 2023-01-26 NOTE — ED Triage Notes (Signed)
Pt c/o bilateral flank pain that goes to abd. Pt staes he has had the pain x 10 days, but has gotten worse the last 3 days. Pt states he has not been voiding much.  Pt endorses nausea. Denies diarrhea, emesis.

## 2023-01-26 NOTE — ED Notes (Signed)
Patient transported to CT 

## 2023-01-26 NOTE — ED Provider Notes (Signed)
Plan to f/u on imaging If negative he can be discharged with Morene Rankins, MD 01/26/23 2337

## 2023-01-26 NOTE — ED Provider Notes (Addendum)
[facility] AT [facility] Provider Note   CSN: [id number] Arrival date & time: 01/26/23  1842     History  Chief Complaint  Patient presents with   Flank Pain   Abdominal Pain    Dean Mayer is a 82 y.o. male.  Dean Mayer , a 82 y.o. male presenting for back pain. Patient admits to lower bilateral back pain x 2 night. Denies recent falls or injuries. Hx positive for ureithrolythiasis on 01/04/23 after presenting with hematuria. Now has decreased urination, abdominal bloating, and mild lower abdominal pain. Did not pass stone. Did not see urology yet. Did not have lithotripsy or stent placement. Admits to subjective fevers, and chills.     The history is provided by the patient and the spouse. No language interpreter was used.  Flank Pain Associated symptoms include abdominal pain. Pertinent negatives include no chest pain and no shortness of breath.  Abdominal Pain Associated symptoms: no chest pain, no chills, no cough, no dysuria, no fever, no hematuria, no shortness of breath, no sore throat and no vomiting        Home Medications Prior to Admission medications   Medication Sig Start Date End Date Taking? Authorizing Provider  acetaminophen (TYLENOL) 500 MG tablet Take 1,000 mg by mouth 2 (two) times daily as needed for mild pain or moderate pain.    [provider]  amLODipine (NORVASC) 10 MG tablet Take 10 mg by mouth daily.    [provider]  aspirin EC 325 MG tablet Take 325 mg by mouth daily.    [provider]  atorvastatin (LIPITOR) 40 MG tablet Take 40 mg by mouth every evening.    [provider]  brimonidine (ALPHAGAN) 0.2 % ophthalmic solution Place 1 drop into the right eye 2 (two) times daily.    [provider]  fluticasone (FLONASE) 50 MCG/ACT nasal spray Place 2 sprays into the nose daily as needed for allergies.    [provider]  levothyroxine (SYNTHROID) 100  MCG tablet Take 100 mcg by mouth daily.    [provider]  lisinopril (ZESTRIL) 5 MG tablet Take 5 mg by mouth 2 (two) times daily.    [provider]  Multiple Vitamins-Minerals (ONE-A-DAY 50 PLUS PO) Take 1 tablet by mouth daily.    [provider]  Omega-3 Fatty Acids (FISH OIL) 1200 MG CAPS Take 1,200 mg by mouth daily.    [provider]  tamsulosin (FLOMAX) 0.4 MG CAPS capsule Take 0.4 mg by mouth daily.    [provider]      Allergies    Patient has no known allergies.    Review of Systems   Review of Systems  Constitutional:  Negative for chills and fever.  HENT:  Negative for ear pain and sore throat.   Eyes:  Negative for pain and visual disturbance.  Respiratory:  Negative for cough and shortness of breath.   Cardiovascular:  Negative for chest pain and palpitations.  Gastrointestinal:  Positive for abdominal pain. Negative for vomiting.  Genitourinary:  Positive for difficulty urinating and flank pain. Negative for dysuria and hematuria.  Musculoskeletal:  Negative for arthralgias and back pain.  Skin:  Negative for color change and rash.  Neurological:  Negative for seizures and syncope.  All other systems reviewed and are negative.   Physical Exam Updated Vital Signs BP (!) 167/66   Pulse 76   Temp 98.8 F (37.1 C) (Oral)   Resp  18   Ht 6\' 4"  (1.93 m)   Wt 111.1 kg   SpO2 97%   BMI 29.82 kg/m  Physical Exam Vitals and nursing note reviewed.  Constitutional:      General: He is not in acute distress.    Appearance: He is well-developed.  HENT:     Head: Normocephalic and atraumatic.  Eyes:     Conjunctiva/sclera: Conjunctivae normal.  Cardiovascular:     Rate and Rhythm: Normal rate and regular rhythm.     Heart sounds: No murmur heard. Pulmonary:     Effort: Pulmonary effort is normal. No respiratory distress.     Breath sounds: Normal breath sounds.  Abdominal:     Palpations: Abdomen is soft.      Tenderness: There is no abdominal tenderness.  Musculoskeletal:        General: No swelling.     Cervical back: Neck supple.  Skin:    General: Skin is warm and dry.     Capillary Refill: Capillary refill takes less than 2 seconds.  Neurological:     Mental Status: He is alert.  Psychiatric:        Mood and Affect: Mood normal.     ED Results / Procedures / Treatments   Labs (all labs ordered are listed, but only abnormal results are displayed) Labs Reviewed  COMPREHENSIVE METABOLIC PANEL - Abnormal; Notable for the following components:      Result Value   Glucose, Bld 148 (*)    All other components within normal limits  CBC - Abnormal; Notable for the following components:   RBC 4.18 (*)    Hemoglobin 12.3 (*)    HCT 37.0 (*)    All other components within normal limits  URINALYSIS, ROUTINE W REFLEX MICROSCOPIC - Abnormal; Notable for the following components:   APPearance HAZY (*)    Hgb urine dipstick SMALL (*)    Protein, ur 30 (*)    Leukocytes,Ua MODERATE (*)    All other components within normal limits  LIPASE, BLOOD    EKG None  Radiology No results found.  Procedures Procedures    Medications Ordered in ED Medications  oxyCODONE-acetaminophen (PERCOCET/ROXICET) 5-325 MG per tablet 1 tablet (1 tablet Oral Given 01/26/23 2245)    ED Course/ Medical Decision Making/ A&P                             Medical Decision Making Amount and/or Complexity of Data Reviewed Labs: ordered. Radiology: ordered.  Risk Prescription drug management.   82 year old male diagnosed last month with ureterolithiasis presenting for bilateral lower back pain.  Patient is alert and oriented x 3, no acute distress, afebrile, stable vital signs.  Abdomen is soft with no tenderness to palpation.  Patient given Norco for pain. Able to urinate to give you a sample.  Will obtain postvoid residual scan to rule out urinary retention.  Urine analysis pending.  Laboratory studies  demonstrate stable renal function.  CT renal stone pending.  Currently no signs or symptoms of sepsis.  Patient signed out to oncoming provider while awaiting CT scan.  UA positive for urinary tract infection. Rocephin given.      Final Clinical Impression(s) / ED Diagnoses Final diagnoses:  Acute bilateral low back pain without sciatica  Acute cystitis with hematuria    Rx / DC Orders ED Discharge Orders     None         Wallace Cullens,  Hessie Knows, DO 01/26/23 2245    Franne Forts, DO 01/26/23 2246

## 2023-01-27 ENCOUNTER — Emergency Department (HOSPITAL_BASED_OUTPATIENT_CLINIC_OR_DEPARTMENT_OTHER): Payer: Medicare HMO

## 2023-01-27 MED ORDER — IOHEXOL 350 MG/ML SOLN
100.0000 mL | Freq: Once | INTRAVENOUS | Status: AC | PRN
  Administered 2023-01-27: 100 mL via INTRAVENOUS

## 2023-01-27 MED ORDER — HYDROCODONE-ACETAMINOPHEN 5-325 MG PO TABS: 1.0000 | ORAL_TABLET | Freq: Four times a day (QID) | ORAL | 0 refills | Status: DC | PRN

## 2023-01-27 NOTE — ED Provider Notes (Signed)
Discussed CT findings with Dr. Cari Caraway with vascular surgery.  He has reviewed imaging.  No acute intervention at this time if patient is asymptomatic.  He recommends repeat CT in 6 weeks and he will help arrange this  Patient is pain-free.  He denies any fevers or vomiting.  He has no leukocytosis.  He feels comfortable for discharge home  He has been prescribed Keflex 3 times daily. He request short course of pain medicine. He was also advised of lung nodules and needs for repeat CAT scan in 6 months since he was a former smoker Discussed with patient and wife and they are agreeable with plan   Zadie Rhine, MD 01/27/23 0151

## 2023-01-27 NOTE — ED Notes (Signed)
Reviewed AVS/discharge instruction with patient. Time allotted for and all questions answered. Patient is agreeable for d/c and escorted to ed exit by staff.  

## 2023-01-27 NOTE — Discharge Instructions (Addendum)
You will need to follow-up with the vascular doctors in the next 6 weeks for repeat CAT scan of your abdomen as we discussed  You will need to follow-up with your primary doctor in the next 6-12 months for a CAT scan of your chest to evaluate lung nodules since you are a smoker

## 2023-01-27 NOTE — ED Notes (Signed)
Patient transported to CT 

## 2023-01-28 LAB — URINE CULTURE

## 2023-01-29 ENCOUNTER — Emergency Department (HOSPITAL_COMMUNITY)
Admission: EM | Admit: 2023-01-29 | Discharge: 2023-01-29 | Disposition: A | Discharge status: Home / Self Care | Payer: Medicare HMO | Attending: Emergency Medicine | Admitting: Emergency Medicine

## 2023-01-29 ENCOUNTER — Other Ambulatory Visit: Payer: Self-pay

## 2023-01-29 ENCOUNTER — Emergency Department (HOSPITAL_COMMUNITY): Payer: Medicare HMO

## 2023-01-29 DIAGNOSIS — Z85818 Personal history of malignant neoplasm of other sites of lip, oral cavity, and pharynx: Secondary | ICD-10-CM | POA: Insufficient documentation

## 2023-01-29 DIAGNOSIS — R1084 Generalized abdominal pain: Secondary | ICD-10-CM | POA: Diagnosis not present

## 2023-01-29 DIAGNOSIS — K5900 Constipation, unspecified: Secondary | ICD-10-CM | POA: Insufficient documentation

## 2023-01-29 DIAGNOSIS — R109 Unspecified abdominal pain: Secondary | ICD-10-CM | POA: Diagnosis present

## 2023-01-29 DIAGNOSIS — I10 Essential (primary) hypertension: Secondary | ICD-10-CM | POA: Insufficient documentation

## 2023-01-29 LAB — URINALYSIS, ROUTINE W REFLEX MICROSCOPIC
Bilirubin Urine: NEGATIVE
Glucose, UA: NEGATIVE mg/dL
Ketones, ur: 20 mg/dL — AB
Leukocytes,Ua: NEGATIVE
Nitrite: NEGATIVE
Protein, ur: NEGATIVE mg/dL
Specific Gravity, Urine: >1.046 — ABNORMAL HIGH (ref 1.005–1.030)
pH: 7 (ref 5.0–8.0)

## 2023-01-29 LAB — COMPREHENSIVE METABOLIC PANEL
ALT: 20 U/L (ref 0–44)
AST: 26 U/L (ref 15–41)
Albumin: 3 g/dL — ABNORMAL LOW (ref 3.5–5.0)
Alkaline Phosphatase: 90 U/L (ref 38–126)
Anion gap: 9 (ref 5–15)
BUN: 15 mg/dL (ref 8–23)
CO2: 26 mmol/L (ref 22–32)
Calcium: 8.9 mg/dL (ref 8.9–10.3)
Chloride: 102 mmol/L (ref 98–111)
Creatinine, Ser: 1.01 mg/dL (ref 0.61–1.24)
GFR, Estimated: >60 mL/min (ref >60)
Glucose, Bld: 118 mg/dL — ABNORMAL HIGH (ref 70–99)
Potassium: 3.9 mmol/L (ref 3.5–5.1)
Sodium: 137 mmol/L (ref 135–145)
Total Bilirubin: 0.4 mg/dL (ref 0.3–1.2)
Total Protein: 6.6 g/dL (ref 6.5–8.1)

## 2023-01-29 LAB — CBC
HCT: 39.1 % (ref 39.0–52.0)
Hemoglobin: 12.8 g/dL — ABNORMAL LOW (ref 13.0–17.0)
MCH: 29 pg (ref 26.0–34.0)
MCHC: 32.7 g/dL (ref 30.0–36.0)
MCV: 88.7 fL (ref 80.0–100.0)
Platelets: 236 10*3/uL (ref 150–400)
RBC: 4.41 MIL/uL (ref 4.22–5.81)
RDW: 12.5 % (ref 11.5–15.5)
WBC: 5.3 10*3/uL (ref 4.0–10.5)
nRBC: 0 % (ref 0.0–0.2)

## 2023-01-29 LAB — LIPASE, BLOOD: Lipase: 28 U/L (ref 11–51)

## 2023-01-29 MED ORDER — SODIUM CHLORIDE 0.9 % IV BOLUS
500.0000 mL | Freq: Once | INTRAVENOUS | Status: AC
  Administered 2023-01-29: 500 mL via INTRAVENOUS

## 2023-01-29 MED ORDER — POLYETHYLENE GLYCOL 3350 17 G PO PACK: 17.0000 g | PACK | Freq: Every day | ORAL | 0 refills | Status: DC

## 2023-01-29 MED ORDER — IOHEXOL 350 MG/ML SOLN
80.0000 mL | Freq: Once | INTRAVENOUS | Status: AC | PRN
  Administered 2023-01-29: 80 mL via INTRAVENOUS

## 2023-01-29 NOTE — ED Notes (Signed)
Pt transported to CT ?

## 2023-01-29 NOTE — ED Provider Notes (Signed)
Emergency Department Provider Note   I have reviewed the triage vital signs and the nursing notes.   HISTORY  Chief Complaint Abdominal Pain   HPI Dean Mayer is a 82 y.o. male with PMH of HTN and GERD presents the emergency department for evaluation of severe abdominal pain.  Symptoms began with low back pain, dry heaves, anterior abdominal pain.  He was unsure about his last bowel movement and so took multiple laxatives this morning along with magnesium citrate.  With continued pain he presented to the emergency department but in the waiting room had a fairly large bowel movement and states he is feeling better.  He has some minimal, residual pain in the mid lower abdomen. No fever or chills. No CP or SOB.  Past Medical History:  Diagnosis Date   Eye abnormalities    GERD (gastroesophageal reflux disease)    Hypertension    Kidney stones    Throat cancer (HCC)     Review of Systems  Constitutional: No fever/chills Cardiovascular: Denies chest pain. Respiratory: Denies shortness of breath. Gastrointestinal: Positive abdominal pain. Mild nausea, no vomiting.  No diarrhea. Positive constipation. Genitourinary: Negative for dysuria. Musculoskeletal: Positive for back pain (improved).  Skin: Negative for rash. Neurological: Negative for headaches, focal weakness or numbness.  ____________________________________________   PHYSICAL EXAM:  VITAL SIGNS: ED Triage Vitals  Enc Vitals Group     BP 01/29/23 1718 (!) 146/78     Pulse Rate 01/29/23 1718 87     Resp 01/29/23 1718 19     Temp 01/29/23 1718 97.7 F (36.5 C)     Temp Source 01/29/23 1718 Oral     SpO2 01/29/23 1718 98 %   Constitutional: Alert and oriented. Well appearing and in no acute distress. Eyes: Conjunctivae are normal. Head: Atraumatic. Nose: No congestion/rhinnorhea. Mouth/Throat: Mucous membranes are moist. Neck: No stridor.   Cardiovascular: Normal rate, regular rhythm. Good peripheral  circulation. Grossly normal heart sounds.   Respiratory: Normal respiratory effort.  No retractions. Lungs CTAB. Gastrointestinal: Soft and nontender. No distention.  Musculoskeletal: No lower extremity tenderness nor edema. No gross deformities of extremities. Neurologic:  Normal speech and language. Skin:  Skin is warm, dry and intact. No rash noted.   ____________________________________________   LABS (all labs ordered are listed, but only abnormal results are displayed)  Labs Reviewed  COMPREHENSIVE METABOLIC PANEL - Abnormal; Notable for the following components:      Result Value   Glucose, Bld 118 (*)    Albumin 3.0 (*)    All other components within normal limits  CBC - Abnormal; Notable for the following components:   Hemoglobin 12.8 (*)    All other components within normal limits  URINALYSIS, ROUTINE W REFLEX MICROSCOPIC - Abnormal; Notable for the following components:   Specific Gravity, Urine >1.046 (*)    Hgb urine dipstick SMALL (*)    Ketones, ur 20 (*)    Bacteria, UA RARE (*)    All other components within normal limits  LIPASE, BLOOD   ____________________________________________  RADIOLOGY  CT Angio Chest/Abd/Pel for Dissection W and/or Wo Contrast  Result Date: 01/29/2023 CLINICAL DATA:  Acute aortic syndrome suspected. EXAM: CT ANGIOGRAPHY CHEST, ABDOMEN AND PELVIS TECHNIQUE: Non-contrast CT of the chest was initially obtained. Multidetector CT imaging through the chest, abdomen and pelvis was performed using the standard protocol during bolus administration of intravenous contrast. Multiplanar reconstructed images and MIPs were obtained and reviewed to evaluate the vascular anatomy. RADIATION DOSE REDUCTION:  This exam was performed according to the departmental dose-optimization program which includes automated exposure control, adjustment of the mA and/or kV according to patient size and/or use of iterative reconstruction technique. CONTRAST:  80mL  OMNIPAQUE IOHEXOL 350 MG/ML SOLN COMPARISON:  CT of the chest abdomen pelvis dated 01/27/2023. FINDINGS: CTA CHEST FINDINGS Cardiovascular: There is no cardiomegaly or pericardial effusion. There is coronary vascular calcification predominantly involving the LAD and RCA. Mild atherosclerotic calcification of the thoracic aorta. No intimal dilatation or dissection. The origins of the great vessels of the aortic arch appear patent. No pulmonary artery embolus identified. Mediastinum/Nodes: No hilar or mediastinal adenopathy. The esophagus is grossly unremarkable. No mediastinal fluid collection. Lungs/Pleura: No focal consolidation, pleural effusion or pneumothorax. The central airways are patent. Musculoskeletal: Osteopenia with degenerative changes of the spine. No acute osseous pathology. Review of the MIP images confirms the above findings. CTA ABDOMEN AND PELVIS FINDINGS VASCULAR Aorta: Advanced atherosclerotic calcification of the abdominal aorta. No aneurysmal dilatation or dissection. There is infiltration of the fat plane anterior to the aorta at the level of the celiac trunk and SMA as seen on the prior CT. This may represent an inflammatory process/vasculitis or related to retroperitoneal fibrosis. A neoplastic process such as lymphoma are not excluded. MRI without and with contrast may provide better evaluation on a nonemergent/outpatient basis. No periaortic fluid collection. No extraluminal contrast. Celiac: Atherosclerotic calcification of the origin of the celiac axis. The celiac trunk and its major branches remain patent. There is a replaced left hepatic artery from the left gastric artery. SMA: Advanced atherosclerotic calcification of the SMA. The SMA remains patent. Renals: There is atherosclerotic calcification the origins of the renal arteries. The renal arteries remain patent. IMA: The IMA is patent. Inflow: Moderate atherosclerotic calcification of the iliac arteries. No aneurysmal dilatation  or dissection. The iliac arteries are patent. Veins: No obvious venous abnormality within the limitations of this arterial phase study. Review of the MIP images confirms the above findings. NON-VASCULAR No intra-abdominal free air or free fluid. Hepatobiliary: Indeterminate areas of enhancement in the liver measure up to 2.5 cm in the right lobe, not characterized, likely flash filling hemangiomas. No biliary ductal dilatation. The gallbladder is unremarkable. Pancreas: Unremarkable. No pancreatic ductal dilatation or surrounding inflammatory changes. Spleen: Normal in size without focal abnormality. Adrenals/Urinary Tract: The adrenal glands are unremarkable. There is a 7 mm nonobstructing right renal interpolar calculus. No hydronephrosis. Small left renal interpolar hypodense lesion demonstrates fluid attenuation, likely a cyst. There is no hydronephrosis on the left. The visualized ureters and urinary bladder appear unremarkable. Stomach/Bowel: There is no bowel obstruction or active inflammation. There is a 2 cm duodenal diverticulum without active inflammatory changes. The appendix is not visualized with certainty. No inflammatory changes identified in the right lower quadrant. Lymphatic: No adenopathy. Reproductive: Several position markers in the prostate gland. Other: None Musculoskeletal: Osteopenia with degenerative changes of the spine. No acute osseous pathology. Review of the MIP images confirms the above findings. IMPRESSION: 1. No acute intrathoracic, abdominal, or pelvic pathology. No aortic dissection or aneurysm. 2. Infiltration of the fat plane anterior to the aorta at the level of the celiac trunk and SMA as seen on the prior CT. Findings may represent an inflammatory process/vasculitis, retroperitoneal fibrosis, or less likely a neoplastic process such as lymphoma. MRI without and with contrast may provide better evaluation on a nonemergent/outpatient basis. 3. A 7 mm nonobstructing right renal  interpolar calculus. No hydronephrosis. 4.  Aortic Atherosclerosis (ICD10-I70.0). Electronically Signed  By: Elgie Collard M.D.   On: 01/29/2023 21:43    ____________________________________________   PROCEDURES  Procedure(s) performed:   Procedures  None  ____________________________________________   INITIAL IMPRESSION / ASSESSMENT AND PLAN / ED COURSE  Pertinent labs & imaging results that were available during my care of the patient were reviewed by me and considered in my medical decision making (see chart for details).   This patient is Presenting for Evaluation of abdominal pain, which does require a range of treatment options, and is a complaint that involves a high risk of morbidity and mortality.  The Differential Diagnoses includes but is not exclusive to acute cholecystitis, intrathoracic causes for epigastric abdominal pain, gastritis, duodenitis, pancreatitis, small bowel or large bowel obstruction, abdominal aortic aneurysm, hernia, gastritis, etc.   Critical Interventions-    Medications  sodium chloride 0.9 % bolus 500 mL (0 mLs Intravenous Stopped 01/29/23 2051)  iohexol (OMNIPAQUE) 350 MG/ML injection 80 mL (80 mLs Intravenous Contrast Given 01/29/23 2127)    Reassessment after intervention:  symptoms improved after BM.    I did obtain Additional Historical Information from wife at bedside.   I decided to review pertinent External Data, and in summary patient seen in the emergency department on 6/22 with low back pain.  Had a dissection scan at that time showing some hazy fat stranding around the suprarenal abdominal aorta.    Clinical Laboratory Tests Ordered, included CMP without AKI. Normal electrolytes. Mild anemia at 12.8. Normal lipase.   Radiologic Tests Ordered, included CTA abdomen. I independently interpreted the images and agree with radiology interpretation.   Cardiac Monitor Tracing which shows NSR.    Social Determinants of Health Risk  patient is not an active smoker.   Medical Decision Making: Summary:  Presents emergency department valuation of severe abdominal pain keeping him up for most of the night.  He treated for constipation and feels improved after a bowel movement.  Minimal pain at this time but was seen in the ED 3 days prior with back pain and dissection scan showed hazy stranding around the abdominal aorta at that time.  Unfortunately, I do feel that repeat vascular imaging is indicated given sudden worsening pain now in the abdomen and radiating inferiorly.   Reevaluation with update and discussion with patient and wife. Continues to feel well. CTA unchanged in comparison to last ED visits. Stable for d/c. Will have patient begin Miralax and follow closely with PCP. Discussed strict ED return precautions.   Considered admission but workup is reassuring and stable for discharge. Discussed return precautions.   Patient's presentation is most consistent with acute presentation with potential threat to life or bodily function.   Disposition: discharge  ____________________________________________  FINAL CLINICAL IMPRESSION(S) / ED DIAGNOSES  Final diagnoses:  Generalized abdominal pain  Constipation, unspecified constipation type     NEW OUTPATIENT MEDICATIONS STARTED DURING THIS VISIT:  Discharge Medication List as of 01/29/2023 10:53 PM     START taking these medications   Details  polyethylene glycol (MIRALAX) 17 g packet Take 17 g by mouth daily., Starting Tue 01/29/2023, Normal        Note:  This document was prepared using Dragon voice recognition software and may include unintentional dictation errors.  Alona Bene, MD, [facility] Emergency Medicine    Charlesetta Milliron, Arlyss Repress, MD 01/30/23 [phone]

## 2023-01-29 NOTE — ED Triage Notes (Signed)
Pt presents with low back pain, constipation, dry heaves, and lower abdominal pain.  States unknown when he had his last bowel movement before today. States yesterday he took 2 laxatives and this morning drank a bottle of mag citrate.  Had a bowel movement while waiting for triage.  Feels some better but still wants to get checked out.

## 2023-01-29 NOTE — Discharge Instructions (Signed)

## 2023-01-30 ENCOUNTER — Telehealth: Payer: Self-pay | Admitting: Vascular Surgery

## 2023-01-30 NOTE — Telephone Encounter (Signed)
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  Thank you. CD

## 2023-02-01 NOTE — Telephone Encounter (Signed)
Appt has been scheduled.

## 2023-02-19 NOTE — Progress Notes (Signed)
Office Visit Note  Patient: Dean Mayer             Date of Birth: 07/01/1941           MRN: [id number]             PCP: Angelique Blonder, MD Referring: Elinor Parkinson, [place] Visit Date: 02/20/2023   Subjective:  New Patient (Initial Visit) (Gout?, right ankle, abnormal labs)   History of Present Illness: JODEY GESKE is a 82 y.o. male here for evaluation of positive ANA and rheumatoid factor and elevated sedimentation rate checked associated with swelling joint pain and apparent cellulitis of the foot and lower leg.  Symptoms started abruptly in April without specific preceding injury or medical events.  Initially this was attributed to gout flare treated with course of colchicine and diclofenac.  Colchicine did not help much only cause side effects even at 1 daily.  Diclofenac was beneficial but symptoms were back immediately with stopping this.  Then was treated with course of prednisone which was also very effective at decreasing the right ankle swelling.  However redness and size affected area increased significantly leading to hospital evaluation for suspected cellulitis was treated with course of antibiotics as well as this lab investigation.  He completed that treatment course and his symptoms are mostly better with resolution of the pain and redness and heat in the area still has some ongoing swelling.  Saw vascular clinic for this no specific intervention though is using compression socks that he was initially provided with at the hospital.  Had CT angiogram obtained for concern of possible arterial disease causing this there was no severe blockages the imaging did report possible findings for retroperitoneal fibrosis.  He is planned for follow-up with vascular surgery clinic outpatient. He has a history of gout but previously was not frequently active and never had weeks along with prolonged inflammation or extension of cellulitis like with this episode. Has not noticed palpable  lymph node swelling, no new skin rashes besides cellulitis, no Raynaud's symptoms, no history of abnormal bleeding or blood clots.   Labs reviewed 01/2023 ANA 1:80 homogenous RF 54 ESR 62 Uric acid 7.5 CRP 20.8  Imaging Reviewed 01/29/23 CTA Chest/Abdomen/Pelvis IMPRESSION: 1. No acute intrathoracic, abdominal, or pelvic pathology. No aortic dissection or aneurysm. 2. Infiltration of the fat plane anterior to the aorta at the level of the celiac trunk and SMA as seen on the prior CT. Findings may represent an inflammatory process/vasculitis, retroperitoneal fibrosis, or less likely a neoplastic process such as lymphoma. MRI without and with contrast may provide better evaluation on a nonemergent/outpatient basis. 3. A 7 mm nonobstructing right renal interpolar calculus. No hydronephrosis. 4.  Aortic Atherosclerosis (ICD10-I70.0).  Activities of Daily Living:  Patient reports morning stiffness for 10 minutes.   Patient Denies nocturnal pain.  Difficulty dressing/grooming: Denies Difficulty climbing stairs: Denies Difficulty getting out of chair: Reports Difficulty using hands for taps, buttons, cutlery, and/or writing: Denies  Review of Systems  Constitutional:  Positive for fatigue.  HENT:  Positive for hearing loss and mouth dryness. Negative for mouth sores.   Eyes:  Negative for dryness.  Respiratory:  Negative for shortness of breath.   Cardiovascular:  Negative for chest pain and palpitations.  Gastrointestinal:  Positive for constipation and diarrhea. Negative for blood in stool.  Endocrine: Positive for increased urination.  Genitourinary:  Negative for involuntary urination.  Musculoskeletal:  Positive for joint swelling and morning stiffness. Negative for joint  pain, gait problem, joint pain, myalgias, muscle weakness, muscle tenderness and myalgias.  Skin:  Positive for color change. Negative for rash, hair loss and sensitivity to sunlight.  Allergic/Immunologic:  Negative for susceptible to infections.  Neurological:  Positive for dizziness. Negative for headaches.  Hematological:  Negative for swollen glands.  Psychiatric/Behavioral:  Positive for depressed mood and sleep disturbance. The patient is not nervous/anxious.     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   Malignant neoplasm of prostate (HCC) 05/28/2018   Primary open angle glaucoma (POAG) of right eye, severe stage 09/16/2017   Lacunar infarct, acute (HCC) 01/05/2016   Confusion 01/04/2016   Hypertensive urgency 01/04/2016   Left-sided weakness 01/04/2016   Stroke (HCC) 01/04/2016   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   Basal cell carcinoma of left eyelid 05/09/2015   Carcinoma of left eyelid 04/25/2015   Acne rosacea 07/26/2014   Eyelid lesion 07/26/2014   Primary open angle glaucoma of both eyes 02/23/2013   Visual field defect 02/23/2013   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    Past Medical History:  Diagnosis Date   Eye abnormalities    GERD (gastroesophageal reflux disease)    Hypertension    Kidney stones    Prostate cancer (HCC)    Throat cancer (HCC)     Family History  Problem Relation Age of Onset   Stroke Mother    Heart attack Mother    Heart disease Brother    Past Surgical History:  Procedure Laterality Date   CAROTID STENT     EYE SURGERY     TONSILLECTOMY      Social History   Social History Narrative   Not on file   Immunization History  Administered Date(s) Administered   Fluad Quad(high Dose 65+) 04/22/2019   Influenza Whole 05/23/2017, 05/14/2018   Influenza, High Dose Seasonal PF 05/15/2017, 06/09/2018   Influenza-Unspecified 05/17/2016, 05/19/2021     Objective: Vital Signs: BP (!) 107/59 (BP Location: Right Arm, Patient Position: Sitting, Cuff Size: Normal)   Pulse 70   Resp 16   Ht 6' 1.25" (1.861 m)   Wt 238 lb (108 kg)   BMI 31.19 kg/m    Physical Exam Eyes:     Conjunctiva/sclera: Conjunctivae normal.  Cardiovascular:     Rate and Rhythm: Normal rate and regular rhythm.  Pulmonary:     Effort: Pulmonary effort is normal.     Breath sounds: Normal breath sounds.  Lymphadenopathy:     Cervical: No cervical adenopathy.  Skin:    General: Skin is warm and dry.     Comments: Mild swelling above right ankle, mild scaling and skin hyperpigmentation  Neurological:     Mental Status: He is alert.  Psychiatric:        Mood and Affect: Mood normal.      Musculoskeletal Exam:  Shoulders full ROM no tenderness or swelling Elbows full ROM no tenderness  or swelling Wrists full ROM no tenderness or swelling Fingers full ROM no tenderness or swelling Knees full ROM no tenderness or swelling   Investigation: No additional findings.  Imaging: CT Angio Chest/Abd/Pel for Dissection W and/or Wo Contrast  Result Date: 01/29/2023 CLINICAL DATA:  Acute aortic syndrome suspected. EXAM: CT ANGIOGRAPHY CHEST, ABDOMEN AND PELVIS TECHNIQUE: Non-contrast CT of the chest was initially obtained. Multidetector CT imaging through the chest, abdomen and pelvis was performed using the standard protocol during bolus administration of intravenous contrast. Multiplanar reconstructed images and MIPs were obtained and reviewed to evaluate the vascular anatomy. RADIATION DOSE REDUCTION: This exam was performed according to the departmental  dose-optimization program which includes automated exposure control, adjustment of the mA and/or kV according to patient size and/or use of iterative reconstruction technique. CONTRAST:  80mL OMNIPAQUE IOHEXOL 350 MG/ML SOLN COMPARISON:  CT of the chest abdomen pelvis dated 01/27/2023. FINDINGS: CTA CHEST FINDINGS Cardiovascular: There is no cardiomegaly or pericardial effusion. There is coronary vascular calcification predominantly involving the LAD and RCA. Mild atherosclerotic calcification of the thoracic aorta. No intimal dilatation or dissection. The origins of the great vessels of the aortic arch appear patent. No pulmonary artery embolus identified. Mediastinum/Nodes: No hilar or mediastinal adenopathy. The esophagus is grossly unremarkable. No mediastinal fluid collection. Lungs/Pleura: No focal consolidation, pleural effusion or pneumothorax. The central airways are patent. Musculoskeletal: Osteopenia with degenerative changes of the spine. No acute osseous pathology. Review of the MIP images confirms the above findings. CTA ABDOMEN AND PELVIS FINDINGS VASCULAR Aorta: Advanced atherosclerotic calcification of the abdominal aorta. No aneurysmal dilatation or dissection. There is infiltration of the fat plane anterior to the aorta at the level of the celiac trunk and SMA as seen on the prior CT. This may represent an inflammatory process/vasculitis or related to retroperitoneal fibrosis. A neoplastic process such as lymphoma are not excluded. MRI without and with contrast may provide better evaluation on a nonemergent/outpatient basis. No periaortic fluid collection. No extraluminal contrast. Celiac: Atherosclerotic calcification of the origin of the celiac axis. The celiac trunk and its major branches remain patent. There is a replaced left hepatic artery from the left gastric artery. SMA: Advanced atherosclerotic calcification of the SMA. The SMA remains patent. Renals: There is atherosclerotic  calcification the origins of the renal arteries. The renal arteries remain patent. IMA: The IMA is patent. Inflow: Moderate atherosclerotic calcification of the iliac arteries. No aneurysmal dilatation or dissection. The iliac arteries are patent. Veins: No obvious venous abnormality within the limitations of this arterial phase study. Review of the MIP images confirms the above findings. NON-VASCULAR No intra-abdominal free air or free fluid. Hepatobiliary: Indeterminate areas of enhancement in the liver measure up to 2.5 cm in the right lobe, not characterized, likely flash filling hemangiomas. No biliary ductal dilatation. The gallbladder is unremarkable. Pancreas: Unremarkable. No pancreatic ductal dilatation or surrounding inflammatory changes. Spleen: Normal in size without focal abnormality. Adrenals/Urinary Tract: The adrenal glands are unremarkable. There is a 7 mm nonobstructing right renal interpolar calculus. No hydronephrosis. Small left renal interpolar hypodense lesion demonstrates fluid attenuation, likely a cyst. There is no hydronephrosis on the left. The visualized ureters and urinary bladder appear unremarkable. Stomach/Bowel: There is no bowel obstruction or active inflammation. There is a 2 cm duodenal diverticulum without active inflammatory changes. The appendix is not visualized with certainty. No inflammatory changes identified in the right lower quadrant. Lymphatic: No adenopathy. Reproductive: Several position markers in the prostate gland. Other: None Musculoskeletal: Osteopenia with degenerative  changes of the spine. No acute osseous pathology. Review of the MIP images confirms the above findings. IMPRESSION: 1. No acute intrathoracic, abdominal, or pelvic pathology. No aortic dissection or aneurysm. 2. Infiltration of the fat plane anterior to the aorta at the level of the celiac trunk and SMA as seen on the prior CT. Findings may represent an inflammatory process/vasculitis,  retroperitoneal fibrosis, or less likely a neoplastic process such as lymphoma. MRI without and with contrast may provide better evaluation on a nonemergent/outpatient basis. 3. A 7 mm nonobstructing right renal interpolar calculus. No hydronephrosis. 4.  Aortic Atherosclerosis (ICD10-I70.0). Electronically Signed   By: Elgie Collard M.D.   On: 01/29/2023 21:43    Recent Labs: Lab Results  Component Value Date   WBC 5.3 01/29/2023   HGB 12.8 (L) 01/29/2023   PLT 236 01/29/2023   NA 137 01/29/2023   K 3.9 01/29/2023   CL 102 01/29/2023   CO2 26 01/29/2023   GLUCOSE 118 (H) 01/29/2023   BUN 15 01/29/2023   CREATININE 1.01 01/29/2023   BILITOT 0.4 01/29/2023   ALKPHOS 90 01/29/2023   AST 26 01/29/2023   ALT 20 01/29/2023   PROT 6.6 01/29/2023   ALBUMIN 3.0 (L) 01/29/2023   CALCIUM 8.9 01/29/2023   GFRAA >60 04/10/2019    Speciality Comments: No specialty comments available.  Procedures:  No procedures performed Allergies: Patient has no known allergies.   Assessment / Plan:     Visit Diagnoses: Sedimentation rate elevation Positive ANA (antinuclear antibody) - Plan: Sedimentation rate, RNP Antibody, Anti-Smith antibody, Sjogrens syndrome-A extractable nuclear antibody, Sjogrens syndrome-B extractable nuclear antibody, Anti-DNA antibody, double-stranded, C3 and C4  Multiple abnormal labs that could cause inflammatory arthritis will check specific antibody panel today.  Also rechecking the previous elevated sedimentation rate now that he is outside of a flare.  The low positive rheumatoid factor but clinically does not show criteria for seropositive RA.  He does not have much pain today if lab testing is unremarkable or nonspecific may need to monitor with plan for rechecking if symptoms come back.   Peripheral arterial disease (HCC)  Has ongoing follow-up scheduled with vascular surgery clinic. No evidence of critical digital ischemia.  BPH with obstruction/lower urinary  tract symptoms Hematuria, unspecified type - Plan: Urinalysis, Routine w reflex microscopic  Incidentally found microscopic hematuria not having any new urine complaint right now will recheck urinalysis today.  History of BPH discussed this can be a risk factor for obstructive nephropathy or risk of UTI.  Idiopathic chronic gout without tophus, unspecified site - Plan: Uric acid  History of gout though presentation was not typical for his historic flares.  Responsiveness to NSAIDs and steroids would be consistent.  Recheck serum uric acid acid level.  Orders: Orders Placed This Encounter  Procedures   MICROSCOPIC MESSAGE   Sedimentation rate   Uric acid   RNP Antibody   Anti-Smith antibody   Sjogrens syndrome-A extractable nuclear antibody   Sjogrens syndrome-B extractable nuclear antibody   Anti-DNA antibody, double-stranded   C3 and C4   Urinalysis, Routine w reflex microscopic   No orders of the defined types were placed in this encounter.    Follow-Up Instructions: No follow-ups on file.   Fuller Plan, MD  Note - This record has been created using [employer].  Chart creation errors have been sought, but may not always  have been located. Such creation errors do not reflect on  the standard of medical care.

## 2023-02-20 ENCOUNTER — Encounter: Payer: Self-pay | Admitting: Internal Medicine

## 2023-02-20 ENCOUNTER — Ambulatory Visit: Payer: Medicare HMO | Attending: Internal Medicine | Admitting: Internal Medicine

## 2023-02-20 VITALS — BP 107/59 | HR 70 | Resp 16 | Ht 73.25 in | Wt 238.0 lb

## 2023-02-20 DIAGNOSIS — M1A00X0 Idiopathic chronic gout, unspecified site, without tophus (tophi): Secondary | ICD-10-CM

## 2023-02-20 DIAGNOSIS — R768 Other specified abnormal immunological findings in serum: Secondary | ICD-10-CM | POA: Diagnosis not present

## 2023-02-20 DIAGNOSIS — M7989 Other specified soft tissue disorders: Secondary | ICD-10-CM

## 2023-02-20 DIAGNOSIS — R319 Hematuria, unspecified: Secondary | ICD-10-CM

## 2023-02-20 DIAGNOSIS — I739 Peripheral vascular disease, unspecified: Secondary | ICD-10-CM | POA: Diagnosis not present

## 2023-02-20 DIAGNOSIS — N401 Enlarged prostate with lower urinary tract symptoms: Secondary | ICD-10-CM

## 2023-02-20 DIAGNOSIS — N138 Other obstructive and reflux uropathy: Secondary | ICD-10-CM

## 2023-02-20 DIAGNOSIS — R700 Elevated erythrocyte sedimentation rate: Secondary | ICD-10-CM

## 2023-02-21 LAB — URINALYSIS, ROUTINE W REFLEX MICROSCOPIC
Bacteria, UA: NONE SEEN /HPF
Bilirubin Urine: NEGATIVE
Glucose, UA: NEGATIVE
Hgb urine dipstick: NEGATIVE
Hyaline Cast: NONE SEEN /LPF
Ketones, ur: NEGATIVE
Nitrite: NEGATIVE
RBC / HPF: NONE SEEN /HPF (ref 0–2)
Specific Gravity, Urine: 1.013 (ref 1.001–1.035)
Squamous Epithelial / HPF: NONE SEEN /HPF (ref <=5)
WBC, UA: >=60 /HPF — AB (ref 0–5)
pH: 6 (ref 5.0–8.0)

## 2023-02-21 LAB — SJOGRENS SYNDROME-A EXTRACTABLE NUCLEAR ANTIBODY: SSA (Ro) (ENA) Antibody, IgG: <1 AI

## 2023-02-21 LAB — MICROSCOPIC MESSAGE

## 2023-02-21 LAB — URIC ACID: Uric Acid, Serum: 6.8 mg/dL (ref 4.0–8.0)

## 2023-02-21 LAB — SJOGRENS SYNDROME-B EXTRACTABLE NUCLEAR ANTIBODY: SSB (La) (ENA) Antibody, IgG: <1 AI

## 2023-02-21 LAB — C3 AND C4
C3 Complement: 155 mg/dL (ref 82–185)
C4 Complement: 25 mg/dL (ref 15–53)

## 2023-02-21 LAB — ANTI-DNA ANTIBODY, DOUBLE-STRANDED: ds DNA Ab: <1 IU/mL

## 2023-02-21 LAB — ANTI-SMITH ANTIBODY: ENA SM Ab Ser-aCnc: <1 AI

## 2023-02-21 LAB — SEDIMENTATION RATE: Sed Rate: 34 mm/h — ABNORMAL HIGH (ref 0–20)

## 2023-02-21 LAB — RNP ANTIBODY: Ribonucleic Protein(ENA) Antibody, IgG: <1 AI

## 2023-02-26 ENCOUNTER — Other Ambulatory Visit: Payer: Self-pay

## 2023-02-26 DIAGNOSIS — I739 Peripheral vascular disease, unspecified: Secondary | ICD-10-CM

## 2023-03-04 ENCOUNTER — Ambulatory Visit: Payer: Medicare HMO | Attending: Internal Medicine | Admitting: Internal Medicine

## 2023-03-04 ENCOUNTER — Encounter: Payer: Self-pay | Admitting: Internal Medicine

## 2023-03-04 ENCOUNTER — Telehealth: Payer: Self-pay | Admitting: Internal Medicine

## 2023-03-04 VITALS — BP 125/58 | HR 75 | Resp 16 | Ht 73.25 in

## 2023-03-04 DIAGNOSIS — M109 Gout, unspecified: Secondary | ICD-10-CM

## 2023-03-04 DIAGNOSIS — M25462 Effusion, left knee: Secondary | ICD-10-CM

## 2023-03-04 MED ORDER — TRIAMCINOLONE ACETONIDE 40 MG/ML IJ SUSP
40.0000 mg | INTRAMUSCULAR | Status: AC | PRN
Start: 2023-03-04 — End: 2023-03-04
  Administered 2023-03-04: 40 mg via INTRA_ARTICULAR

## 2023-03-04 MED ORDER — LIDOCAINE HCL 1 % IJ SOLN
3.0000 mL | INTRAMUSCULAR | Status: AC | PRN
Start: 2023-03-04 — End: 2023-03-04
  Administered 2023-03-04: 3 mL

## 2023-03-04 NOTE — Telephone Encounter (Signed)
Dr. Dimple Casey had a cancellation so patient is scheduled for today 03/04/23 at 2:40 pm.

## 2023-03-04 NOTE — Telephone Encounter (Signed)
Patient's wife Tinnie Gens called to see if Lars could be seen today 03/04/23 due to swelling and pain in his leg.  Jennie states Dr. Dimple Casey told her he should call the office when he was experiencing a flair and that he could "work them in."

## 2023-03-04 NOTE — Progress Notes (Signed)
Office Visit Note  Patient: Dean Mayer             Date of Birth: 10-18-1940           MRN: [id number]             PCP: Angelique Blonder, MD Referring: Angelique Blonder, MD Visit Date: 03/04/2023   Subjective:  Pain and Edema of the Left Knee   History of Present Illness: Dean Mayer is a 82 y.o. male here for follow up for joint inflammation now with sudden onset of left knee pain and swelling starting last Thursday evening. He did not recall any preceding activity or diet outside of the ordinary for him. Worsened by following day or two and now unable to bear weight without assistance needing a walker or an arm for support.  Previous HPI 02/20/23 Dean Mayer is a 82 y.o. male here for evaluation of positive ANA and rheumatoid factor and elevated sedimentation rate checked associated with swelling joint pain and apparent cellulitis of the foot and lower leg.  Symptoms started abruptly in April without specific preceding injury or medical events.  Initially this was attributed to gout flare treated with course of colchicine and diclofenac.  Colchicine did not help much only cause side effects even at 1 daily.  Diclofenac was beneficial but symptoms were back immediately with stopping this.  Then was treated with course of prednisone which was also very effective at decreasing the right ankle swelling.  However redness and size affected area increased significantly leading to hospital evaluation for suspected cellulitis was treated with course of antibiotics as well as this lab investigation.  He completed that treatment course and his symptoms are mostly better with resolution of the pain and redness and heat in the area still has some ongoing swelling.  Saw vascular clinic for this no specific intervention though is using compression socks that he was initially provided with at the hospital.  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  He is planned for follow-up with vascular surgery clinic outpatient. He has a history of gout but previously was not frequently active and never had weeks along with prolonged inflammation or extension of cellulitis like with this episode. Has not noticed palpable lymph node swelling, no new skin rashes besides cellulitis, no Raynaud's symptoms, no history of abnormal bleeding or blood clots.     Labs reviewed 01/2023 ANA 1:80 homogenous RF 54 ESR 62 Uric acid 7.5 CRP 20.8   Imaging Reviewed 01/29/23 CTA Chest/Abdomen/Pelvis IMPRESSION: 1. No acute intrathoracic, abdominal, or pelvic pathology. No aortic dissection or aneurysm. 2. Infiltration of the fat plane anterior to the aorta at the level of the celiac trunk and SMA as seen on the prior CT. Findings may represent an inflammatory process/vasculitis, retroperitoneal fibrosis, or less likely a neoplastic process such as lymphoma. MRI without and with contrast may provide better evaluation on a nonemergent/outpatient basis. 3. A 7 mm nonobstructing right renal interpolar calculus. No hydronephrosis. 4.  Aortic Atherosclerosis (ICD10-I70.0).   Review of Systems  Constitutional:  Positive for fatigue.  HENT:  Positive for mouth dryness. Negative for mouth sores.   Eyes:  Negative for dryness.  Respiratory:  Negative for shortness of breath.   Cardiovascular:  Negative for chest pain and palpitations.  Gastrointestinal:  Negative for blood in stool, constipation and diarrhea.  Endocrine:  Negative for increased urination.  Genitourinary:  Negative for involuntary urination.  Musculoskeletal:  Positive for joint pain, gait problem, joint pain, joint swelling, muscle weakness and morning stiffness. Negative for myalgias, muscle tenderness and myalgias.  Skin:  Positive for color change, hair loss and sensitivity to sunlight. Negative for  rash.  Allergic/Immunologic: Negative for susceptible to infections.  Neurological:  Positive for dizziness. Negative for headaches.  Hematological:  Negative for swollen glands.  Psychiatric/Behavioral:  Positive for depressed mood and sleep disturbance. The patient is not nervous/anxious.     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   Malignant neoplasm of prostate (HCC) 05/28/2018   Primary open angle glaucoma (POAG) of right eye, severe stage 09/16/2017   Lacunar infarct, acute (HCC) 01/05/2016   Confusion 01/04/2016   Hypertensive urgency 01/04/2016   Left-sided weakness 01/04/2016   Stroke (HCC) 01/04/2016   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   Basal cell carcinoma of left eyelid 05/09/2015   Carcinoma of left eyelid 04/25/2015   Acne rosacea 07/26/2014   Eyelid lesion 07/26/2014   Primary open angle glaucoma of both eyes 02/23/2013   Visual field defect 02/23/2013   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    Past Medical History:  Diagnosis Date   Eye abnormalities    GERD (gastroesophageal reflux disease)    Hypertension    Kidney stones    Prostate cancer (HCC)    Throat cancer (HCC)     Family History  Problem Relation Age of Onset   Stroke Mother    Heart attack Mother    Heart disease Brother    Past Surgical History:  Procedure Laterality Date   CAROTID STENT     EYE  SURGERY     TONSILLECTOMY     Social History   Social History Narrative   Not on file   Immunization History  Administered Date(s) Administered   Fluad Quad(high Dose 65+) 04/22/2019   Influenza Whole 05/23/2017, 05/14/2018   Influenza, High Dose Seasonal PF 05/15/2017, 06/09/2018   Influenza-Unspecified 05/17/2016, 05/19/2021     Objective: Vital Signs: BP (!) 125/58 (BP Location: Right Arm, Patient Position: Sitting, Cuff Size: Normal)   Pulse 75   Resp 16   Ht 6' 1.25" (1.861 m)   BMI 31.19 kg/m    Physical Exam Cardiovascular:     Rate and Rhythm: Normal rate and regular rhythm.  Pulmonary:     Effort: Pulmonary effort is normal.     Breath sounds: Normal breath sounds.  Musculoskeletal:     Comments: L>R pedal edema 2+  Skin:    General: Skin is warm and dry.     Findings: No rash.  Neurological:     Mental Status: He is alert.  Psychiatric:        Mood and Affect: Mood normal.      Musculoskeletal Exam:  Elbows full ROM no tenderness or swelling Wrists full ROM no  tenderness or swelling Fingers full ROM no tenderness or swelling Left knee tenderness to pressure, decreased flexion and extension ROM, large effusion present   Investigation: No additional findings.  Imaging: No results found.  Recent Labs: Lab Results  Component Value Date   WBC 5.3 01/29/2023   HGB 12.8 (L) 01/29/2023   PLT 236 01/29/2023   NA 137 01/29/2023   K 3.9 01/29/2023   CL 102 01/29/2023   CO2 26 01/29/2023   GLUCOSE 118 (H) 01/29/2023   BUN 15 01/29/2023   CREATININE 1.01 01/29/2023   BILITOT 0.4 01/29/2023   ALKPHOS 90 01/29/2023   AST 26 01/29/2023   ALT 20 01/29/2023   PROT 6.6 01/29/2023   ALBUMIN 3.0 (L) 01/29/2023   CALCIUM 8.9 01/29/2023   GFRAA >60 04/10/2019    Speciality Comments: No specialty comments available.  Procedures:  Large Joint Inj: L knee on 03/04/2023 3:45 PM Indications: pain, joint swelling and diagnostic evaluation Details: 22 G  1.5 in needle, superolateral approach Medications: 3 mL lidocaine 1 %; 40 mg triamcinolone acetonide 40 MG/ML Aspirate: 45 mL yellow and cloudy; sent for lab analysis Outcome: tolerated well, no immediate complications Procedure, treatment alternatives, risks and benefits explained, specific risks discussed. Consent was given by the patient. Immediately prior to procedure a time out was called to verify the correct patient, procedure, equipment, support staff and site/side marked as required. Patient was prepped and draped in the usual sterile fashion.     Allergies: Patient has no known allergies.   Assessment / Plan:     Visit Diagnoses: Effusion, left knee - Plan: Synovial Fluid Analysis, Complete  Back today for acute onset of left knee swelling the history sounds similar as when his right ankle flared up earlier this year.  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  Aspiration for fluid analysis today and intraarticular steroid injection for acute treatment. F/U plan pending results.  Orders: Orders Placed This Encounter  Procedures   Large Joint Inj   Synovial Fluid Analysis, Complete   No orders of the defined types were placed in this encounter.    Follow-Up Instructions: No follow-ups on file.   Fuller Plan, MD  Note - This record has been created using [employer].  Chart creation errors have been sought, but may not always  have been located. Such creation errors do not reflect on  the standard of medical care.

## 2023-03-05 MED ORDER — ALLOPURINOL 100 MG PO TABS
100.0000 mg | ORAL_TABLET | Freq: Every day | ORAL | 0 refills | Status: AC
Start: 2023-03-05 — End: ?

## 2023-03-05 NOTE — Progress Notes (Signed)
The knee fluid definitely shows gout.   With multiple attacks this year already I recommend he will need to start on a uric acid lowering drug to prevent flares. I am sending a prescription for allopurinol 100 mg once daily.  This medicine can increase the risk of symptoms in the short term so if he does not feel like his inflammation is resolving after the injection we can send in a prescription for oral prednisone.  We can keep the scheduled follow up next month and measure his uric acid level at that time.

## 2023-03-05 NOTE — Addendum Note (Signed)
Addended by: Fuller Plan on: 03/05/2023 07:46 AM   Modules accepted: Orders

## 2023-03-06 ENCOUNTER — Telehealth: Payer: Self-pay | Admitting: *Deleted

## 2023-03-06 DIAGNOSIS — M109 Gout, unspecified: Secondary | ICD-10-CM

## 2023-03-06 MED ORDER — PREDNISONE 20 MG PO TABS
ORAL_TABLET | ORAL | 0 refills | Status: DC
Start: 2023-03-06 — End: 2023-03-20

## 2023-03-06 NOTE — Telephone Encounter (Signed)
Patient advised Dr. Dimple Casey sent a prescription for a prednisone taper for 9 days to his pharmacy. He can start that right away. If we have to do injections in the future we can try using a stronger dose of medication.   High dose steroids can cause fluid retention so just monitor for overall leg swelling or weight change. Patient inquired if he should continue the Allopurinol. Patient advised he should.

## 2023-03-06 NOTE — Telephone Encounter (Signed)
I sent a prescription for a prednisone taper for 9 days to his pharmacy. He can start that right away. If we have to do injections in the future we can try using a stronger dose of medication.  High dose steroids can cause fluid retention so just monitor for overall leg swelling or weight change.

## 2023-03-06 NOTE — Addendum Note (Signed)
Addended by: Fuller Plan on: 03/06/2023 10:10 AM   Modules accepted: Orders

## 2023-03-06 NOTE — Telephone Encounter (Signed)
Patient contacted the office and states he was seen in the office for an appointment on Monday. Patient states he had fluid drawn off of his knee and was given an injection. Patient states it is not helping. Patient states his knee is worse and it is swelling. Please advise.

## 2023-03-08 ENCOUNTER — Ambulatory Visit: Payer: Medicare HMO | Admitting: Internal Medicine

## 2023-03-11 ENCOUNTER — Encounter (HOSPITAL_COMMUNITY): Payer: Self-pay | Admitting: *Deleted

## 2023-03-11 ENCOUNTER — Other Ambulatory Visit: Payer: Self-pay

## 2023-03-11 ENCOUNTER — Inpatient Hospital Stay: Admission: RE | Admit: 2023-03-11 | Payer: Medicare HMO | Source: Ambulatory Visit

## 2023-03-11 ENCOUNTER — Emergency Department (HOSPITAL_COMMUNITY)
Admission: EM | Admit: 2023-03-11 | Discharge: 2023-03-11 | Disposition: A | Discharge status: Home / Self Care | Payer: Medicare HMO | Attending: Emergency Medicine | Admitting: Emergency Medicine

## 2023-03-11 ENCOUNTER — Emergency Department (HOSPITAL_COMMUNITY): Payer: Medicare HMO

## 2023-03-11 DIAGNOSIS — Z7982 Long term (current) use of aspirin: Secondary | ICD-10-CM | POA: Diagnosis not present

## 2023-03-11 DIAGNOSIS — M25562 Pain in left knee: Secondary | ICD-10-CM | POA: Diagnosis present

## 2023-03-11 DIAGNOSIS — M25462 Effusion, left knee: Secondary | ICD-10-CM

## 2023-03-11 LAB — BODY FLUID CULTURE W GRAM STAIN

## 2023-03-11 LAB — CBC WITH DIFFERENTIAL/PLATELET
Abs Immature Granulocytes: 0.09 10*3/uL — ABNORMAL HIGH (ref 0.00–0.07)
Basophils Absolute: 0 10*3/uL (ref 0.0–0.1)
Basophils Relative: 0 %
Eosinophils Absolute: 0.1 10*3/uL (ref 0.0–0.5)
Eosinophils Relative: 1 %
HCT: 38.6 % — ABNORMAL LOW (ref 39.0–52.0)
Hemoglobin: 12.5 g/dL — ABNORMAL LOW (ref 13.0–17.0)
Immature Granulocytes: 1 %
Lymphocytes Relative: 6 %
Lymphs Abs: 0.7 10*3/uL (ref 0.7–4.0)
MCH: 29.1 pg (ref 26.0–34.0)
MCHC: 32.4 g/dL (ref 30.0–36.0)
MCV: 89.8 fL (ref 80.0–100.0)
Monocytes Absolute: 0.5 10*3/uL (ref 0.1–1.0)
Monocytes Relative: 5 %
Neutro Abs: 9.1 10*3/uL — ABNORMAL HIGH (ref 1.7–7.7)
Neutrophils Relative %: 87 %
Platelets: 339 10*3/uL (ref 150–400)
RBC: 4.3 MIL/uL (ref 4.22–5.81)
RDW: 13.5 % (ref 11.5–15.5)
WBC: 10.5 10*3/uL (ref 4.0–10.5)
nRBC: 0 % (ref 0.0–0.2)

## 2023-03-11 LAB — BASIC METABOLIC PANEL
Anion gap: 13 (ref 5–15)
BUN: 27 mg/dL — ABNORMAL HIGH (ref 8–23)
CO2: 27 mmol/L (ref 22–32)
Calcium: 9.1 mg/dL (ref 8.9–10.3)
Chloride: 98 mmol/L (ref 98–111)
Creatinine, Ser: 0.87 mg/dL (ref 0.61–1.24)
GFR, Estimated: >60 mL/min (ref >60)
Glucose, Bld: 139 mg/dL — ABNORMAL HIGH (ref 70–99)
Potassium: 4.1 mmol/L (ref 3.5–5.1)
Sodium: 138 mmol/L (ref 135–145)

## 2023-03-11 LAB — SYNOVIAL CELL COUNT + DIFF, W/ CRYSTALS
Crystals, Fluid: NONE SEEN
Eosinophils-Synovial: 0 % (ref 0–1)
Lymphocytes-Synovial Fld: 5 % (ref 0–20)
Monocyte-Macrophage-Synovial Fluid: 6 % — ABNORMAL LOW (ref 50–90)
Neutrophil, Synovial: 89 % — ABNORMAL HIGH (ref 0–25)
WBC, Synovial: 26500 /mm3 — ABNORMAL HIGH (ref 0–200)

## 2023-03-11 MED ORDER — HYDROCODONE-ACETAMINOPHEN 5-325 MG PO TABS
1.0000 | ORAL_TABLET | Freq: Once | ORAL | Status: AC
  Administered 2023-03-11: 1 via ORAL
  Filled 2023-03-11: qty 1

## 2023-03-11 MED ORDER — MORPHINE SULFATE (PF) 4 MG/ML IV SOLN
4.0000 mg | Freq: Once | INTRAVENOUS | Status: AC
  Administered 2023-03-11: 4 mg via INTRAVENOUS
  Filled 2023-03-11: qty 1

## 2023-03-11 MED ORDER — HYDROCODONE-ACETAMINOPHEN 5-325 MG PO TABS: 1.0000 | ORAL_TABLET | Freq: Four times a day (QID) | ORAL | 0 refills | Status: DC | PRN

## 2023-03-11 MED ORDER — LIDOCAINE-EPINEPHRINE (PF) 2 %-1:200000 IJ SOLN
10.0000 mL | Freq: Once | INTRAMUSCULAR | Status: AC
  Administered 2023-03-11: 10 mL
  Filled 2023-03-11: qty 20

## 2023-03-11 NOTE — Discharge Instructions (Addendum)
Follow-up closely with your rheumatologist and/your primary care physician.  Take your medicines as prescribed and we have also added on hydrocodone for pain control.  This can cause some side effects to do not drive or a bit heavy machinery or combine with other medications.  If you develop fever, new or worsening knee pain, redness to the knee, worsening swelling, or any other new/concerning symptoms then return to the ER or call 911.

## 2023-03-11 NOTE — ED Triage Notes (Signed)
Patient presents toED via [facility] c/o left knee pain. States he was seen by his PCP on Monday and had fluid drained from his knee and given prednison. And is also taking allopurinol, states his knee was feeling better yest. Was able to walk however last pm his knee started to hurt again, this am was unbearable, left knee is swollen.

## 2023-03-11 NOTE — ED Provider Notes (Signed)
[place] EMERGENCY DEPARTMENT AT [facility] Provider Note   CSN: [id number] Arrival date & time: 03/11/23  7829     History  Chief Complaint  Patient presents with   Knee Pain    Dean Mayer is a 82 y.o. male.  HPI 82 year old male presents with left knee pain.  Pain originally started 9 days ago.  He has a previous history of gout in his foot but never in his knee.  However he went to a rheumatologist last week and had a knee arthrocentesis.  He was started on allopurinol.  After not much improvement and a diagnosis of gout he was also started on prednisone.  He feels like he had temporary improvement but now is getting worse again.  Has been walking with a walker but has been unable to bend his knee since this all started.  No fevers.  He feels like the fluid is reaccumulating and his pain is worsening.  He feels like his knee will get spasms and lock up, causing increased pain.  Thus he called EMS for evaluation in the ED.  Home Medications Prior to Admission medications   Medication Sig Start Date End Date Taking? Authorizing Provider  acetaminophen (TYLENOL) 500 MG tablet Take 1,000 mg by mouth 2 (two) times daily as needed for mild pain or moderate pain.   Yes [provider]  allopurinol (ZYLOPRIM) 100 MG tablet Take 1 tablet (100 mg total) by mouth daily. 03/05/23  Yes Rice, Jamesetta Orleans, MD  amLODipine (NORVASC) 10 MG tablet Take 10 mg by mouth daily.   Yes [provider]  ascorbic acid (VITAMIN C) 500 MG tablet Take 1,000 mg by mouth daily.   Yes [provider]  aspirin EC 325 MG tablet Take 325 mg by mouth daily.   Yes [provider]  atorvastatin (LIPITOR) 40 MG tablet Take 40 mg by mouth every evening.   Yes [provider]  brimonidine (ALPHAGAN) 0.2 % ophthalmic solution Place 1 drop into the right eye 2 (two) times daily.   Yes [provider]  fluticasone (FLONASE) 50 MCG/ACT nasal spray Place 2  sprays into the nose daily as needed for allergies.   Yes [provider]  HYDROcodone-acetaminophen (NORCO) 5-325 MG tablet Take 1 tablet by mouth every 6 (six) hours as needed for severe pain. 03/11/23  Yes Pricilla Loveless, MD  Iron Combinations (IRON COMPLEX PO) Take 1 tablet by mouth daily.   Yes [provider]  levothyroxine (SYNTHROID) 100 MCG tablet Take 100 mcg by mouth daily.   Yes [provider]  lisinopril (ZESTRIL) 5 MG tablet Take 5 mg by mouth 2 (two) times daily.   Yes [provider]  Multiple Vitamins-Minerals (ONE-A-DAY 50 PLUS PO) Take 1 tablet by mouth daily.   Yes [provider]  Omega-3 Fatty Acids (FISH OIL) 1200 MG CAPS Take 1,200 mg by mouth daily.   Yes [provider]  predniSONE (DELTASONE) 20 MG tablet Take 3 tablets (60 mg total) by mouth daily with breakfast for 3 days, THEN 2 tablets (40 mg total) daily with breakfast for 3 days, THEN 1 tablet (20 mg total) daily with breakfast for 3 days. 03/06/23 03/15/23 Yes Rice, Jamesetta Orleans, MD  tamsulosin (FLOMAX) 0.4 MG CAPS capsule Take 0.4 mg by mouth daily.   Yes [provider]  polyethylene glycol (MIRALAX) 17 g packet Take 17 g by mouth daily. Patient not taking: Reported on 03/11/2023 01/29/23   Alona Bene  G, MD      Allergies    Patient has no known allergies.    Review of Systems   Review of Systems  Constitutional:  Negative for fever.  Musculoskeletal:  Positive for arthralgias and joint swelling.    Physical Exam Updated Vital Signs BP 137/61   Pulse 68   Temp 97.7 F (36.5 C) (Oral)   Resp 16   Ht 6\' 4"  (1.93 m)   Wt 108 kg   SpO2 95%   BMI 28.97 kg/m  Physical Exam Vitals and nursing note reviewed.  Constitutional:      Appearance: He is well-developed.  HENT:     Head: Normocephalic and atraumatic.  Cardiovascular:     Rate and Rhythm: Normal rate and regular rhythm.     Pulses:          Dorsalis pedis pulses are 2+ on the  left side.  Pulmonary:     Effort: Pulmonary effort is normal.  Musculoskeletal:     Left knee: Effusion present. No erythema. Decreased range of motion. Tenderness present.     Comments: There is no erythema but there is a joint effusion with mild warmth. Very limited ROM.  Skin:    General: Skin is warm and dry.  Neurological:     Mental Status: He is alert.     ED Results / Procedures / Treatments   Labs (all labs ordered are listed, but only abnormal results are displayed) Labs Reviewed  CBC WITH DIFFERENTIAL/PLATELET - Abnormal; Notable for the following components:      Result Value   Hemoglobin 12.5 (*)    HCT 38.6 (*)    Neutro Abs 9.1 (*)    Abs Immature Granulocytes 0.09 (*)    All other components within normal limits  BASIC METABOLIC PANEL - Abnormal; Notable for the following components:   Glucose, Bld 139 (*)    BUN 27 (*)    All other components within normal limits  SYNOVIAL CELL COUNT + DIFF, W/ CRYSTALS - Abnormal; Notable for the following components:   Color, Synovial AMBER (*)    Appearance-Synovial TURBID (*)    WBC, Synovial 26,500 (*)    Neutrophil, Synovial 89 (*)    Monocyte-Macrophage-Synovial Fluid 6 (*)    All other components within normal limits  BODY FLUID CULTURE W GRAM STAIN  GLUCOSE, BODY FLUID OTHER            PROTEIN, BODY FLUID (OTHER)    EKG None  Radiology DG Knee Complete 4 Views Left  Result Date: 03/11/2023 CLINICAL DATA:  Left knee pain. EXAM: LEFT KNEE - COMPLETE 4+ VIEW COMPARISON:  None Available. FINDINGS: No evidence of fracture, dislocation, or joint effusion. No significant joint space narrowing is noted. Minimal osteophyte formation is noted laterally. Soft tissues are unremarkable. IMPRESSION: Minimal degenerative changes as described above. No acute abnormality seen. Electronically Signed   By: Lupita Raider M.D.   On: 03/11/2023 09:32    Procedures .Joint Aspiration/Arthrocentesis  Date/Time: 03/11/2023 11:16  AM  Performed by: Pricilla Loveless, MD Authorized by: Pricilla Loveless, MD   Consent:    Consent obtained:  Verbal   Consent given by:  Patient Universal protocol:    Patient identity confirmed:  Verbally with patient Location:    Location:  Knee   Knee:  L knee Anesthesia:    Anesthesia method:  Local infiltration   Local anesthetic:  Lidocaine 2% WITH epi Procedure details:    Preparation: Patient was  prepped and draped in usual sterile fashion     Needle gauge:  18 G   Approach:  Lateral   Aspirate amount:  30 cc   Aspirate characteristics:  Serous (amber) Post-procedure details:    Dressing:  Adhesive bandage   Procedure completion:  Tolerated well, no immediate complications     Medications Ordered in ED Medications  morphine (PF) 4 MG/ML injection 4 mg (4 mg Intravenous Given 03/11/23 0846)  lidocaine-EPINEPHrine (XYLOCAINE W/EPI) 2 %-1:200000 (PF) injection 10 mL (10 mLs Infiltration Given by Other 03/11/23 1211)  morphine (PF) 4 MG/ML injection 4 mg (4 mg Intravenous Given 03/11/23 1052)  HYDROcodone-acetaminophen (NORCO/VICODIN) 5-325 MG per tablet 1 tablet (1 tablet Oral Given 03/11/23 1553)    ED Course/ Medical Decision Making/ A&P                                 Medical Decision Making Amount and/or Complexity of Data Reviewed Labs: ordered.    Details: White blood cell count 26,000 in the synovial fluid.  No organisms on Gram stain.  WBC in the serum is normal. Radiology: ordered and independent interpretation performed.    Details: No bony abnormality  Risk Prescription drug management.   Patient presents with continued and worsening left knee pain.  No erythema but does have decreased range of motion so an arthrocentesis was performed.  Serum lab work is unremarkable including upper limits of normal but normal WBC.  His synovial fluid has been analyzed and his white blood cells are decreasing down to 26,000 compared to 70,000 last week.  Gram stain does not  show any bacteria.  Seems like he is improving.  He feels well enough for discharge with pain control.  No systemic symptoms.  I doubt this is representative of septic arthritis.  Offered orthopedics referral but he prefers to follow-up with his rheumatologist to has been taking care of his gout.  At this point, I think is reasonable to discharge home with pain control and with return precautions.        Final Clinical Impression(s) / ED Diagnoses Final diagnoses:  Effusion of left knee    Rx / DC Orders ED Discharge Orders          Ordered    HYDROcodone-acetaminophen (NORCO) 5-325 MG tablet  Every 6 hours PRN        03/11/23 1624              Pricilla Loveless, MD 03/11/23 1644

## 2023-03-14 ENCOUNTER — Encounter (HOSPITAL_COMMUNITY): Payer: Self-pay | Admitting: Emergency Medicine

## 2023-03-14 ENCOUNTER — Inpatient Hospital Stay (HOSPITAL_COMMUNITY)
Admission: EM | Admit: 2023-03-14 | Discharge: 2023-03-20 | DRG: 486 | Disposition: A | Discharge status: Home Health | Payer: Medicare HMO | Attending: Internal Medicine | Admitting: Internal Medicine

## 2023-03-14 ENCOUNTER — Other Ambulatory Visit: Payer: Self-pay

## 2023-03-14 DIAGNOSIS — Z8249 Family history of ischemic heart disease and other diseases of the circulatory system: Secondary | ICD-10-CM

## 2023-03-14 DIAGNOSIS — A4101 Sepsis due to Methicillin susceptible Staphylococcus aureus: Principal | ICD-10-CM | POA: Diagnosis present

## 2023-03-14 DIAGNOSIS — E871 Hypo-osmolality and hyponatremia: Secondary | ICD-10-CM | POA: Diagnosis present

## 2023-03-14 DIAGNOSIS — K219 Gastro-esophageal reflux disease without esophagitis: Secondary | ICD-10-CM | POA: Diagnosis present

## 2023-03-14 DIAGNOSIS — M00062 Staphylococcal arthritis, left knee: Secondary | ICD-10-CM | POA: Diagnosis not present

## 2023-03-14 DIAGNOSIS — R7989 Other specified abnormal findings of blood chemistry: Secondary | ICD-10-CM | POA: Diagnosis present

## 2023-03-14 DIAGNOSIS — M10062 Idiopathic gout, left knee: Secondary | ICD-10-CM | POA: Diagnosis present

## 2023-03-14 DIAGNOSIS — K5900 Constipation, unspecified: Secondary | ICD-10-CM | POA: Diagnosis not present

## 2023-03-14 DIAGNOSIS — Z95828 Presence of other vascular implants and grafts: Secondary | ICD-10-CM

## 2023-03-14 DIAGNOSIS — I2510 Atherosclerotic heart disease of native coronary artery without angina pectoris: Secondary | ICD-10-CM | POA: Diagnosis present

## 2023-03-14 DIAGNOSIS — Z79890 Hormone replacement therapy (postmenopausal): Secondary | ICD-10-CM

## 2023-03-14 DIAGNOSIS — M1990 Unspecified osteoarthritis, unspecified site: Secondary | ICD-10-CM

## 2023-03-14 DIAGNOSIS — Z87442 Personal history of urinary calculi: Secondary | ICD-10-CM

## 2023-03-14 DIAGNOSIS — E8809 Other disorders of plasma-protein metabolism, not elsewhere classified: Secondary | ICD-10-CM | POA: Diagnosis present

## 2023-03-14 DIAGNOSIS — M064 Inflammatory polyarthropathy: Secondary | ICD-10-CM | POA: Diagnosis present

## 2023-03-14 DIAGNOSIS — B9561 Methicillin susceptible Staphylococcus aureus infection as the cause of diseases classified elsewhere: Secondary | ICD-10-CM

## 2023-03-14 DIAGNOSIS — Z9861 Coronary angioplasty status: Secondary | ICD-10-CM

## 2023-03-14 DIAGNOSIS — Z823 Family history of stroke: Secondary | ICD-10-CM

## 2023-03-14 DIAGNOSIS — Z8546 Personal history of malignant neoplasm of prostate: Secondary | ICD-10-CM

## 2023-03-14 DIAGNOSIS — Z87891 Personal history of nicotine dependence: Secondary | ICD-10-CM

## 2023-03-14 DIAGNOSIS — Z79899 Other long term (current) drug therapy: Secondary | ICD-10-CM

## 2023-03-14 DIAGNOSIS — M25562 Pain in left knee: Principal | ICD-10-CM

## 2023-03-14 DIAGNOSIS — E039 Hypothyroidism, unspecified: Secondary | ICD-10-CM | POA: Diagnosis present

## 2023-03-14 DIAGNOSIS — N400 Enlarged prostate without lower urinary tract symptoms: Secondary | ICD-10-CM | POA: Diagnosis present

## 2023-03-14 DIAGNOSIS — R7303 Prediabetes: Secondary | ICD-10-CM | POA: Diagnosis present

## 2023-03-14 DIAGNOSIS — H269 Unspecified cataract: Secondary | ICD-10-CM | POA: Diagnosis present

## 2023-03-14 DIAGNOSIS — Z7982 Long term (current) use of aspirin: Secondary | ICD-10-CM

## 2023-03-14 DIAGNOSIS — E785 Hyperlipidemia, unspecified: Secondary | ICD-10-CM | POA: Diagnosis present

## 2023-03-14 DIAGNOSIS — M109 Gout, unspecified: Secondary | ICD-10-CM

## 2023-03-14 DIAGNOSIS — Z66 Do not resuscitate: Secondary | ICD-10-CM | POA: Diagnosis present

## 2023-03-14 DIAGNOSIS — M00869 Arthritis due to other bacteria, unspecified knee: Secondary | ICD-10-CM

## 2023-03-14 DIAGNOSIS — R7881 Bacteremia: Secondary | ICD-10-CM | POA: Diagnosis present

## 2023-03-14 DIAGNOSIS — R768 Other specified abnormal immunological findings in serum: Secondary | ICD-10-CM | POA: Diagnosis present

## 2023-03-14 DIAGNOSIS — Z85819 Personal history of malignant neoplasm of unspecified site of lip, oral cavity, and pharynx: Secondary | ICD-10-CM

## 2023-03-14 DIAGNOSIS — I10 Essential (primary) hypertension: Secondary | ICD-10-CM | POA: Diagnosis present

## 2023-03-14 DIAGNOSIS — Z23 Encounter for immunization: Secondary | ICD-10-CM

## 2023-03-14 DIAGNOSIS — D649 Anemia, unspecified: Secondary | ICD-10-CM | POA: Diagnosis present

## 2023-03-14 LAB — COMPREHENSIVE METABOLIC PANEL
ALT: 62 U/L — ABNORMAL HIGH (ref 0–44)
AST: 52 U/L — ABNORMAL HIGH (ref 15–41)
Albumin: 1.9 g/dL — ABNORMAL LOW (ref 3.5–5.0)
Alkaline Phosphatase: 138 U/L — ABNORMAL HIGH (ref 38–126)
Anion gap: 10 (ref 5–15)
BUN: 23 mg/dL (ref 8–23)
CO2: 27 mmol/L (ref 22–32)
Calcium: 9 mg/dL (ref 8.9–10.3)
Chloride: 100 mmol/L (ref 98–111)
Creatinine, Ser: 0.86 mg/dL (ref 0.61–1.24)
GFR, Estimated: >60 mL/min (ref >60)
Glucose, Bld: 155 mg/dL — ABNORMAL HIGH (ref 70–99)
Potassium: 4.2 mmol/L (ref 3.5–5.1)
Sodium: 137 mmol/L (ref 135–145)
Total Bilirubin: 0.5 mg/dL (ref 0.3–1.2)
Total Protein: 5.7 g/dL — ABNORMAL LOW (ref 6.5–8.1)

## 2023-03-14 LAB — URIC ACID: Uric Acid, Serum: 4.8 mg/dL (ref 3.7–8.6)

## 2023-03-14 LAB — CBC
HCT: 39.5 % (ref 39.0–52.0)
Hemoglobin: 12.7 g/dL — ABNORMAL LOW (ref 13.0–17.0)
MCH: 28.1 pg (ref 26.0–34.0)
MCHC: 32.2 g/dL (ref 30.0–36.0)
MCV: 87.4 fL (ref 80.0–100.0)
Platelets: 369 K/uL (ref 150–400)
RBC: 4.52 MIL/uL (ref 4.22–5.81)
RDW: 13.4 % (ref 11.5–15.5)
WBC: 9.5 K/uL (ref 4.0–10.5)
nRBC: 0 % (ref 0.0–0.2)

## 2023-03-14 LAB — C-REACTIVE PROTEIN: CRP: 14.1 mg/dL — ABNORMAL HIGH

## 2023-03-14 LAB — SEDIMENTATION RATE: Sed Rate: 97 mm/hr — ABNORMAL HIGH (ref 0–16)

## 2023-03-14 MED ORDER — LIDOCAINE HCL (PF) 1 % IJ SOLN
5.0000 mL | Freq: Once | INTRAMUSCULAR | Status: AC
  Administered 2023-03-14: 5 mL
  Filled 2023-03-14: qty 5

## 2023-03-14 MED ORDER — CEFAZOLIN SODIUM-DEXTROSE 2-4 GM/100ML-% IV SOLN
2.0000 g | Freq: Once | INTRAVENOUS | Status: AC
  Administered 2023-03-14: 2 g via INTRAVENOUS
  Filled 2023-03-14: qty 100

## 2023-03-14 NOTE — ED Notes (Signed)
Pt refused Blood cultures

## 2023-03-14 NOTE — ED Provider Notes (Signed)
[place] EMERGENCY DEPARTMENT AT [facility] Provider Note   CSN: [id number] Arrival date & time: 03/14/23  2126     History  Chief Complaint  Patient presents with   Knee Pain    Dean Mayer is a 82 y.o. male.  82 year old male with a history of gout who presents the emergency department with atraumatic left knee pain.  Says it has been present for 2 weeks.  Has had 2 joint aspirations.  Once 10 days ago with an outpatient doctor that showed 75K white blood cells with monosodium urate crystals.  He was started on allopurinol and prednisone at that time.  Says that the pain persisted so 3 days ago he came in and joint aspiration showed 26,000 and white blood cells with a few Staph aureus and no crystals.  Today started developing a fever of 100.7 F and has had worsening pain and difficulty moving his leg so decided to come into the emergency department for evaluation.  Did have an x-ray on 03/11/2023 which showed some degenerative changes without any acute findings.       Home Medications Prior to Admission medications   Medication Sig Start Date End Date Taking? Authorizing Provider  acetaminophen (TYLENOL) 500 MG tablet Take 1,000 mg by mouth 2 (two) times daily as needed for mild pain or moderate pain.   Yes [provider]  allopurinol (ZYLOPRIM) 100 MG tablet Take 1 tablet (100 mg total) by mouth daily. 03/05/23  Yes Rice, Jamesetta Orleans, MD  amLODipine (NORVASC) 10 MG tablet Take 10 mg by mouth daily.   Yes [provider]  ascorbic acid (VITAMIN C) 500 MG tablet Take 1,000 mg by mouth daily.   Yes [provider]  aspirin EC 325 MG tablet Take 325 mg by mouth daily.   Yes [provider]  atorvastatin (LIPITOR) 40 MG tablet Take 40 mg by mouth every evening.   Yes [provider]  brimonidine (ALPHAGAN) 0.2 % ophthalmic solution Place 1 drop into the right eye 2 (two) times daily.   Yes [provider]   fluticasone (FLONASE) 50 MCG/ACT nasal spray Place 2 sprays into the nose daily as needed for allergies.   Yes [provider]  Iron Combinations (IRON COMPLEX PO) Take 1 tablet by mouth daily.   Yes [provider]  levothyroxine (SYNTHROID) 100 MCG tablet Take 100 mcg by mouth daily.   Yes [provider]  lisinopril (ZESTRIL) 5 MG tablet Take 5 mg by mouth 2 (two) times daily.   Yes [provider]  Multiple Vitamins-Minerals (ONE-A-DAY 50 PLUS PO) Take 1 tablet by mouth daily.   Yes [provider]  Omega-3 Fatty Acids (FISH OIL) 1200 MG CAPS Take 1,200 mg by mouth daily.   Yes [provider]  tamsulosin (FLOMAX) 0.4 MG CAPS capsule Take 0.4 mg by mouth daily.   Yes [provider]  HYDROcodone-acetaminophen (NORCO) 5-325 MG tablet Take 1 tablet by mouth every 6 (six) hours as needed for severe pain. Patient not taking: Reported on 03/15/2023 03/11/23   Pricilla Loveless, MD  polyethylene glycol (MIRALAX) 17 g packet Take 17 g by mouth daily. Patient not taking: Reported on 03/11/2023 01/29/23   Long, Arlyss Repress, MD  predniSONE (DELTASONE) 20 MG tablet Take 3 tablets (60 mg total) by mouth daily with breakfast for 3 days, THEN 2 tablets (40 mg total) daily with breakfast for 3 days, THEN 1 tablet (20 mg total) daily with breakfast for  3 days. Patient not taking: Reported on 03/15/2023 03/06/23 03/15/23  Fuller Plan, MD      Allergies    Patient has no known allergies.    Review of Systems   Review of Systems  Physical Exam Updated Vital Signs BP (!) 166/61   Pulse 82   Temp 98.3 F (36.8 C) (Oral)   Resp 20   Ht 6\' 4"  (1.93 m)   Wt 108 kg   SpO2 94%   BMI 28.98 kg/m  Physical Exam Vitals and nursing note reviewed.  Constitutional:      General: He is not in acute distress.    Appearance: He is well-developed.  HENT:     Head: Normocephalic and atraumatic.     Right Ear: External ear normal.     Left Ear: External  ear normal.     Nose: Nose normal.  Eyes:     Extraocular Movements: Extraocular movements intact.     Conjunctiva/sclera: Conjunctivae normal.     Pupils: Pupils are equal, round, and reactive to light.  Pulmonary:     Effort: Pulmonary effort is normal. No respiratory distress.  Musculoskeletal:     Cervical back: Normal range of motion and neck supple.     Right lower leg: No edema.     Left lower leg: No edema.     Comments: Left knee with moderate-sized effusion.  Range of motion limited and can only flex approximately 10 to 15 degrees.  Left knee does feel warm.  Skin:    General: Skin is warm and dry.  Neurological:     Mental Status: He is alert. Mental status is at baseline.  Psychiatric:        Mood and Affect: Mood normal.        Behavior: Behavior normal.     ED Results / Procedures / Treatments   Labs (all labs ordered are listed, but only abnormal results are displayed) Labs Reviewed  SEDIMENTATION RATE - Abnormal; Notable for the following components:      Result Value   Sed Rate 97 (*)    All other components within normal limits  C-REACTIVE PROTEIN - Abnormal; Notable for the following components:   CRP 14.1 (*)    All other components within normal limits  CBC - Abnormal; Notable for the following components:   Hemoglobin 12.7 (*)    All other components within normal limits  COMPREHENSIVE METABOLIC PANEL - Abnormal; Notable for the following components:   Glucose, Bld 155 (*)    Total Protein 5.7 (*)    Albumin 1.9 (*)    AST 52 (*)    ALT 62 (*)    Alkaline Phosphatase 138 (*)    All other components within normal limits  SYNOVIAL CELL COUNT + DIFF, W/ CRYSTALS - Abnormal; Notable for the following components:   Color, Synovial RED (*)    Appearance-Synovial TURBID (*)    WBC, Synovial 35,550 (*)    Neutrophil, Synovial 94 (*)    Monocyte-Macrophage-Synovial Fluid 3 (*)    All other components within normal limits  COMPREHENSIVE METABOLIC PANEL  - Abnormal; Notable for the following components:   Sodium 134 (*)    Glucose, Bld 141 (*)    Calcium 8.4 (*)    Total Protein 5.8 (*)    Albumin 1.9 (*)    ALT 53 (*)    All other components within normal limits  C-REACTIVE PROTEIN - Abnormal; Notable for the following components:  CRP 13.2 (*)    All other components within normal limits  CBC WITH DIFFERENTIAL/PLATELET - Abnormal; Notable for the following components:   Hemoglobin 12.2 (*)    HCT 37.3 (*)    All other components within normal limits  SEDIMENTATION RATE - Abnormal; Notable for the following components:   Sed Rate 70 (*)    All other components within normal limits  CULTURE, BLOOD (ROUTINE X 2)  BODY FLUID CULTURE W GRAM STAIN  CULTURE, BLOOD (ROUTINE X 2)  SURGICAL PCR SCREEN  URIC ACID  PROTIME-INR  CBC WITH DIFFERENTIAL/PLATELET  SURGICAL PATHOLOGY    EKG None  Radiology No results found.  Procedures .Joint Aspiration/Arthrocentesis  Date/Time: 03/15/2023 12:28 PM  Performed by: Rondel Baton, MD Authorized by: Rondel Baton, MD   Consent:    Consent obtained:  Verbal   Risks discussed:  Bleeding, infection, pain and incomplete drainage Universal protocol:    Patient identity confirmed:  Verbally with patient Location:    Location:  Knee   Knee:  L knee Anesthesia:    Anesthesia method:  Local infiltration   Local anesthetic:  Lidocaine 1% WITH epi Procedure details:    Preparation: Patient was prepped and draped in usual sterile fashion     Needle gauge:  18 G   Approach:  Medial   Aspirate amount:  30   Aspirate characteristics:  Blood-tinged, purulent and cloudy Post-procedure details:    Dressing:  Adhesive bandage   Procedure completion:  Tolerated well, no immediate complications     Medications Ordered in ED Medications  ondansetron (ZOFRAN) injection 4 mg ( Intravenous MAR Hold 03/15/23 1130)  amLODipine (NORVASC) tablet 10 mg ( Oral Automatically Held 03/23/23 1000)   aspirin EC tablet 325 mg ( Oral Automatically Held 03/23/23 1000)  levothyroxine (SYNTHROID) tablet 100 mcg ( Oral Automatically Held 03/23/23 0600)  tamsulosin (FLOMAX) capsule 0.4 mg ( Oral Automatically Held 03/23/23 1000)  allopurinol (ZYLOPRIM) tablet 100 mg ( Oral Automatically Held 03/23/23 1000)  lisinopril (ZESTRIL) tablet 5 mg ( Oral Automatically Held 03/23/23 2200)  atorvastatin (LIPITOR) tablet 40 mg ( Oral Automatically Held 03/23/23 1800)  brimonidine (ALPHAGAN) 0.2 % ophthalmic solution 1 drop ( Right Eye Automatically Held 03/23/23 2200)  enoxaparin (LOVENOX) injection 40 mg ( Subcutaneous Automatically Held 03/23/23 1000)  acetaminophen (TYLENOL) tablet 650 mg ( Oral MAR Hold 03/15/23 1130)    Or  acetaminophen (TYLENOL) suppository 650 mg ( Rectal MAR Hold 03/15/23 1130)  senna-docusate (Senokot-S) tablet 1 tablet ( Oral MAR Hold 03/15/23 1130)  0.9 %  sodium chloride infusion ( Intravenous New Bag/Given 03/15/23 0614)  ceFAZolin (ANCEF) IVPB 2g/100 mL premix ( Intravenous Automatically Held 03/23/23 2200)  chlorhexidine (HIBICLENS) 4 % liquid 4 Application (has no administration in time range)  povidone-iodine 10 % swab 2 Application (has no administration in time range)  ceFAZolin (ANCEF) IVPB 2g/100 mL premix (has no administration in time range)  HYDROmorphone (DILAUDID) injection 0.5 mg ( Intravenous MAR Hold 03/15/23 1130)  lactated ringers infusion ( Intravenous New Bag/Given 03/15/23 1208)  0.9 % irrigation (POUR BTL) (1,000 mLs Irrigation Given 03/15/23 1220)  vancomycin (VANCOCIN) powder (1,000 mg Topical Given 03/15/23 1220)  lidocaine (PF) (XYLOCAINE) 1 % injection 5 mL (5 mLs Other Given 03/14/23 2331)  ceFAZolin (ANCEF) IVPB 2g/100 mL premix (0 g Intravenous Stopped 03/15/23 0030)  colchicine tablet 1.2 mg (1.2 mg Oral Given 03/15/23 0230)    Followed by  colchicine tablet 0.6 mg (0.6 mg Oral Given 03/15/23  5284)  chlorhexidine (PERIDEX) 0.12 % solution 15 mL (15 mLs Mouth/Throat Given  03/15/23 1208)    Or  Oral care mouth rinse ( Mouth Rinse See Alternative 03/15/23 1208)    ED Course/ Medical Decision Making/ A&P Clinical Course as of 03/15/23 1229  Thu Mar 14, 2023  2210 Dr Susa Simmonds from orthopedics consulted [RP]  2350 Dr Joneen Roach from hospitalist to admit [RP]    Clinical Course User Index [RP] Rondel Baton, MD                                 Medical Decision Making Amount and/or Complexity of Data Reviewed Labs: ordered.  Risk Prescription drug management. Decision regarding hospitalization.   BRANTSON DULAC is a 82 y.o. male with comorbidities that complicate the patient evaluation including atraumatic left knee pain.  Says it has been present for 2 weeks.    Initial Ddx:  Gout, septic arthritis, other inflammatory arthritis, fracture  MDM/ED course:  Patient presents emergency department with atraumatic pain of his left knee.  Does have a history of gout and was treated with prednisone and allopurinol but reports his pain is persisted.  His initial joint aspiration did appear to be consistent with gout although his white blood cell counts were slightly higher than would be expected and were potentially in septic range.  The second ray aspiration did grow pansensitive Staph aureus and he has not yet started on any antibiotics.  Joint aspiration today revealed bloody and cloudy fluid.  Analysis pending at this time.  His ESR and CRP were elevated but white blood cell count was within normal limits.  Did have a fever prior to arrival but none here though he did take antipyretics prior to arrival.  Was discussed with Dr. Susa Simmonds from orthopedic surgery who will see the patient and evaluate him for possible septic joint.  Hospitalist has admitted the patient started on Ancef based on his most recent knee cultures showing Staph aureus that is pansensitive.  Of note, did already have any x-ray that did not show any acute findings at one of his recent visits.  This  patient presents to the ED for concern of complaints listed in HPI, this involves an extensive number of treatment options, and is a complaint that carries with it a high risk of complications and morbidity. Disposition including potential need for admission considered.   Dispo: Admit to Floor  Additional history obtained from spouse Records reviewed Outpatient Clinic Notes and ED Visit Notes The following labs were independently interpreted: CBC and show no acute abnormality I have reviewed the patients home medications and made adjustments as needed Consults: Hospitalist and Orthopedics Social Determinants of health:  Elevated         Final Clinical Impression(s) / ED Diagnoses Final diagnoses:  Acute pain of left knee  Inflammatory arthritis  Staphylococcal arthritis of left knee [facility])    Rx / DC Orders ED Discharge Orders     None         Rondel Baton, MD 03/15/23 1229

## 2023-03-14 NOTE — ED Triage Notes (Signed)
Patient BIB [facility] c/o left knee pain.  Patient has been having problems with gout x 3 months and had fluid drawn off knee on Monday.  Patient reports subjective fever today, took tylenol at 1400 and had one episode of vomiting.   168/70 86 HR 16 RR 96% RA 160 CBG 100.7 temporal

## 2023-03-15 ENCOUNTER — Inpatient Hospital Stay (HOSPITAL_COMMUNITY): Payer: Medicare HMO | Admitting: Anesthesiology

## 2023-03-15 ENCOUNTER — Encounter (HOSPITAL_COMMUNITY): Payer: Self-pay | Admitting: Family Medicine

## 2023-03-15 ENCOUNTER — Encounter (HOSPITAL_COMMUNITY)
Admission: EM | Disposition: A | Discharge status: Home Health | Payer: Self-pay | Source: Home / Self Care | Attending: Internal Medicine

## 2023-03-15 ENCOUNTER — Other Ambulatory Visit: Payer: Self-pay

## 2023-03-15 DIAGNOSIS — I2510 Atherosclerotic heart disease of native coronary artery without angina pectoris: Secondary | ICD-10-CM | POA: Diagnosis present

## 2023-03-15 DIAGNOSIS — E7800 Pure hypercholesterolemia, unspecified: Secondary | ICD-10-CM | POA: Diagnosis not present

## 2023-03-15 DIAGNOSIS — E871 Hypo-osmolality and hyponatremia: Secondary | ICD-10-CM | POA: Diagnosis present

## 2023-03-15 DIAGNOSIS — M00862 Arthritis due to other bacteria, left knee: Secondary | ICD-10-CM | POA: Diagnosis not present

## 2023-03-15 DIAGNOSIS — R011 Cardiac murmur, unspecified: Secondary | ICD-10-CM | POA: Diagnosis not present

## 2023-03-15 DIAGNOSIS — D649 Anemia, unspecified: Secondary | ICD-10-CM | POA: Diagnosis present

## 2023-03-15 DIAGNOSIS — Z79890 Hormone replacement therapy (postmenopausal): Secondary | ICD-10-CM | POA: Diagnosis not present

## 2023-03-15 DIAGNOSIS — M109 Gout, unspecified: Secondary | ICD-10-CM | POA: Diagnosis not present

## 2023-03-15 DIAGNOSIS — S83242A Other tear of medial meniscus, current injury, left knee, initial encounter: Secondary | ICD-10-CM | POA: Diagnosis not present

## 2023-03-15 DIAGNOSIS — Z7982 Long term (current) use of aspirin: Secondary | ICD-10-CM | POA: Diagnosis not present

## 2023-03-15 DIAGNOSIS — M009 Pyogenic arthritis, unspecified: Secondary | ICD-10-CM | POA: Diagnosis not present

## 2023-03-15 DIAGNOSIS — H269 Unspecified cataract: Secondary | ICD-10-CM | POA: Diagnosis present

## 2023-03-15 DIAGNOSIS — K219 Gastro-esophageal reflux disease without esophagitis: Secondary | ICD-10-CM | POA: Diagnosis present

## 2023-03-15 DIAGNOSIS — M064 Inflammatory polyarthropathy: Secondary | ICD-10-CM | POA: Diagnosis present

## 2023-03-15 DIAGNOSIS — E039 Hypothyroidism, unspecified: Secondary | ICD-10-CM | POA: Diagnosis present

## 2023-03-15 DIAGNOSIS — R768 Other specified abnormal immunological findings in serum: Secondary | ICD-10-CM | POA: Diagnosis present

## 2023-03-15 DIAGNOSIS — M00062 Staphylococcal arthritis, left knee: Secondary | ICD-10-CM | POA: Diagnosis present

## 2023-03-15 DIAGNOSIS — B9689 Other specified bacterial agents as the cause of diseases classified elsewhere: Secondary | ICD-10-CM | POA: Diagnosis not present

## 2023-03-15 DIAGNOSIS — R7303 Prediabetes: Secondary | ICD-10-CM | POA: Diagnosis present

## 2023-03-15 DIAGNOSIS — R7881 Bacteremia: Secondary | ICD-10-CM | POA: Diagnosis not present

## 2023-03-15 DIAGNOSIS — B9561 Methicillin susceptible Staphylococcus aureus infection as the cause of diseases classified elsewhere: Secondary | ICD-10-CM | POA: Diagnosis not present

## 2023-03-15 DIAGNOSIS — E785 Hyperlipidemia, unspecified: Secondary | ICD-10-CM | POA: Diagnosis present

## 2023-03-15 DIAGNOSIS — Z85819 Personal history of malignant neoplasm of unspecified site of lip, oral cavity, and pharynx: Secondary | ICD-10-CM

## 2023-03-15 DIAGNOSIS — M00869 Arthritis due to other bacteria, unspecified knee: Secondary | ICD-10-CM

## 2023-03-15 DIAGNOSIS — N400 Enlarged prostate without lower urinary tract symptoms: Secondary | ICD-10-CM | POA: Diagnosis present

## 2023-03-15 DIAGNOSIS — I10 Essential (primary) hypertension: Secondary | ICD-10-CM | POA: Diagnosis present

## 2023-03-15 DIAGNOSIS — Z87891 Personal history of nicotine dependence: Secondary | ICD-10-CM

## 2023-03-15 DIAGNOSIS — Z66 Do not resuscitate: Secondary | ICD-10-CM | POA: Diagnosis present

## 2023-03-15 DIAGNOSIS — A4101 Sepsis due to Methicillin susceptible Staphylococcus aureus: Secondary | ICD-10-CM | POA: Diagnosis present

## 2023-03-15 DIAGNOSIS — Z9861 Coronary angioplasty status: Secondary | ICD-10-CM | POA: Diagnosis not present

## 2023-03-15 DIAGNOSIS — M10062 Idiopathic gout, left knee: Secondary | ICD-10-CM | POA: Diagnosis present

## 2023-03-15 DIAGNOSIS — E8809 Other disorders of plasma-protein metabolism, not elsewhere classified: Secondary | ICD-10-CM | POA: Diagnosis present

## 2023-03-15 DIAGNOSIS — M25562 Pain in left knee: Secondary | ICD-10-CM | POA: Diagnosis not present

## 2023-03-15 DIAGNOSIS — Z95828 Presence of other vascular implants and grafts: Secondary | ICD-10-CM | POA: Diagnosis not present

## 2023-03-15 DIAGNOSIS — Z23 Encounter for immunization: Secondary | ICD-10-CM | POA: Diagnosis present

## 2023-03-15 DIAGNOSIS — R7989 Other specified abnormal findings of blood chemistry: Secondary | ICD-10-CM | POA: Diagnosis present

## 2023-03-15 DIAGNOSIS — K5900 Constipation, unspecified: Secondary | ICD-10-CM | POA: Diagnosis not present

## 2023-03-15 HISTORY — PX: I & D EXTREMITY: SHX5045

## 2023-03-15 LAB — CBC WITH DIFFERENTIAL/PLATELET
Abs Immature Granulocytes: 0.06 10*3/uL (ref 0.00–0.07)
Basophils Absolute: 0 10*3/uL (ref 0.0–0.1)
Basophils Relative: 0 %
Eosinophils Absolute: 0.1 10*3/uL (ref 0.0–0.5)
Eosinophils Relative: 1 %
HCT: 37.3 % — ABNORMAL LOW (ref 39.0–52.0)
Hemoglobin: 12.2 g/dL — ABNORMAL LOW (ref 13.0–17.0)
Immature Granulocytes: 1 %
Lymphocytes Relative: 9 %
Lymphs Abs: 0.8 10*3/uL (ref 0.7–4.0)
MCH: 28.5 pg (ref 26.0–34.0)
MCHC: 32.7 g/dL (ref 30.0–36.0)
MCV: 87.1 fL (ref 80.0–100.0)
Monocytes Absolute: 0.5 10*3/uL (ref 0.1–1.0)
Monocytes Relative: 6 %
Neutro Abs: 7.1 10*3/uL (ref 1.7–7.7)
Neutrophils Relative %: 83 %
Platelets: 357 10*3/uL (ref 150–400)
RBC: 4.28 MIL/uL (ref 4.22–5.81)
RDW: 14 % (ref 11.5–15.5)
WBC: 8.6 10*3/uL (ref 4.0–10.5)
nRBC: 0 % (ref 0.0–0.2)

## 2023-03-15 LAB — AEROBIC/ANAEROBIC CULTURE W GRAM STAIN (SURGICAL/DEEP WOUND)

## 2023-03-15 LAB — COMPREHENSIVE METABOLIC PANEL
ALT: 53 U/L — ABNORMAL HIGH (ref 0–44)
AST: 38 U/L (ref 15–41)
Albumin: 1.9 g/dL — ABNORMAL LOW (ref 3.5–5.0)
Alkaline Phosphatase: 120 U/L (ref 38–126)
Anion gap: 11 (ref 5–15)
BUN: 21 mg/dL (ref 8–23)
CO2: 25 mmol/L (ref 22–32)
Calcium: 8.4 mg/dL — ABNORMAL LOW (ref 8.9–10.3)
Chloride: 98 mmol/L (ref 98–111)
Creatinine, Ser: 0.77 mg/dL (ref 0.61–1.24)
GFR, Estimated: >60 mL/min (ref >60)
Glucose, Bld: 141 mg/dL — ABNORMAL HIGH (ref 70–99)
Potassium: 3.8 mmol/L (ref 3.5–5.1)
Sodium: 134 mmol/L — ABNORMAL LOW (ref 135–145)
Total Bilirubin: 0.7 mg/dL (ref 0.3–1.2)
Total Protein: 5.8 g/dL — ABNORMAL LOW (ref 6.5–8.1)

## 2023-03-15 LAB — PROTIME-INR
INR: 1.1 (ref 0.8–1.2)
Prothrombin Time: 13.9 seconds (ref 11.4–15.2)

## 2023-03-15 LAB — SURGICAL PCR SCREEN

## 2023-03-15 LAB — SEDIMENTATION RATE: Sed Rate: 70 mm/hr — ABNORMAL HIGH (ref 0–16)

## 2023-03-15 LAB — C-REACTIVE PROTEIN: CRP: 13.2 mg/dL — ABNORMAL HIGH (ref <1.0)

## 2023-03-15 SURGERY — IRRIGATION AND DEBRIDEMENT EXTREMITY
Anesthesia: General | Laterality: Left

## 2023-03-15 MED ORDER — HYDROCODONE-ACETAMINOPHEN 5-325 MG PO TABS
1.0000 | ORAL_TABLET | ORAL | Status: DC | PRN
  Administered 2023-03-15: 2 via ORAL
  Administered 2023-03-16 – 2023-03-17 (×2): 1 via ORAL
  Administered 2023-03-17 – 2023-03-20 (×8): 2 via ORAL
  Filled 2023-03-15 (×3): qty 2
  Filled 2023-03-15 (×2): qty 1
  Filled 2023-03-15 (×6): qty 2

## 2023-03-15 MED ORDER — VANCOMYCIN HCL 1000 MG IV SOLR
INTRAVENOUS | Status: AC
  Filled 2023-03-15: qty 20

## 2023-03-15 MED ORDER — PHENYLEPHRINE 80 MCG/ML (10ML) SYRINGE FOR IV PUSH (FOR BLOOD PRESSURE SUPPORT)
PREFILLED_SYRINGE | INTRAVENOUS | Status: DC | PRN
  Administered 2023-03-15: 160 ug via INTRAVENOUS
  Administered 2023-03-15: 80 ug via INTRAVENOUS
  Administered 2023-03-15: 160 ug via INTRAVENOUS
  Administered 2023-03-15: 80 ug via INTRAVENOUS
  Administered 2023-03-15: 160 ug via INTRAVENOUS

## 2023-03-15 MED ORDER — CEFAZOLIN SODIUM-DEXTROSE 2-4 GM/100ML-% IV SOLN
2.0000 g | INTRAVENOUS | Status: AC
  Administered 2023-03-15: 2 g via INTRAVENOUS
  Filled 2023-03-15: qty 100

## 2023-03-15 MED ORDER — FENTANYL CITRATE (PF) 100 MCG/2ML IJ SOLN
25.0000 ug | INTRAMUSCULAR | Status: DC | PRN
  Administered 2023-03-15 (×2): 25 ug via INTRAVENOUS

## 2023-03-15 MED ORDER — CEFAZOLIN SODIUM-DEXTROSE 2-4 GM/100ML-% IV SOLN
2.0000 g | Freq: Three times a day (TID) | INTRAVENOUS | Status: DC
  Administered 2023-03-15 – 2023-03-20 (×16): 2 g via INTRAVENOUS
  Filled 2023-03-15 (×16): qty 100

## 2023-03-15 MED ORDER — CHLORHEXIDINE GLUCONATE 0.12 % MT SOLN
15.0000 mL | Freq: Once | OROMUCOSAL | Status: AC
  Administered 2023-03-15: 15 mL via OROMUCOSAL
  Filled 2023-03-15: qty 15

## 2023-03-15 MED ORDER — METHOCARBAMOL 1000 MG/10ML IJ SOLN: 500.0000 mg | Freq: Four times a day (QID) | INTRAVENOUS | Status: DC | PRN

## 2023-03-15 MED ORDER — FENTANYL CITRATE (PF) 100 MCG/2ML IJ SOLN
INTRAMUSCULAR | Status: AC
  Administered 2023-03-15: 25 ug via INTRAVENOUS
  Filled 2023-03-15: qty 2

## 2023-03-15 MED ORDER — DEXAMETHASONE SODIUM PHOSPHATE 10 MG/ML IJ SOLN
INTRAMUSCULAR | Status: DC | PRN
  Administered 2023-03-15: 4 mg via INTRAVENOUS

## 2023-03-15 MED ORDER — PROPOFOL 10 MG/ML IV BOLUS
INTRAVENOUS | Status: AC
  Filled 2023-03-15: qty 20

## 2023-03-15 MED ORDER — HYDROMORPHONE HCL 1 MG/ML IJ SOLN
0.5000 mg | INTRAMUSCULAR | Status: DC | PRN
  Administered 2023-03-15 – 2023-03-17 (×3): 0.5 mg via INTRAVENOUS
  Filled 2023-03-15 (×4): qty 0.5

## 2023-03-15 MED ORDER — SODIUM CHLORIDE 0.9 % IV SOLN: INTRAVENOUS | Status: DC

## 2023-03-15 MED ORDER — MORPHINE SULFATE (PF) 2 MG/ML IV SOLN
2.0000 mg | INTRAVENOUS | Status: DC | PRN
  Administered 2023-03-15 (×3): 2 mg via INTRAVENOUS
  Filled 2023-03-15 (×3): qty 1

## 2023-03-15 MED ORDER — DOCUSATE SODIUM 100 MG PO CAPS
100.0000 mg | ORAL_CAPSULE | Freq: Two times a day (BID) | ORAL | Status: DC
  Administered 2023-03-15 – 2023-03-20 (×10): 100 mg via ORAL
  Filled 2023-03-15 (×10): qty 1

## 2023-03-15 MED ORDER — LIDOCAINE 2% (20 MG/ML) 5 ML SYRINGE
INTRAMUSCULAR | Status: DC | PRN
  Administered 2023-03-15: 100 mg via INTRAVENOUS

## 2023-03-15 MED ORDER — 0.9 % SODIUM CHLORIDE (POUR BTL) OPTIME
TOPICAL | Status: DC | PRN
  Administered 2023-03-15: 1000 mL

## 2023-03-15 MED ORDER — DEXAMETHASONE SODIUM PHOSPHATE 10 MG/ML IJ SOLN
INTRAMUSCULAR | Status: AC
  Filled 2023-03-15: qty 1

## 2023-03-15 MED ORDER — SENNOSIDES-DOCUSATE SODIUM 8.6-50 MG PO TABS: 1.0000 | ORAL_TABLET | Freq: Every evening | ORAL | Status: DC | PRN

## 2023-03-15 MED ORDER — ONDANSETRON HCL 4 MG/2ML IJ SOLN
INTRAMUSCULAR | Status: AC
  Filled 2023-03-15: qty 2

## 2023-03-15 MED ORDER — TAMSULOSIN HCL 0.4 MG PO CAPS
0.4000 mg | ORAL_CAPSULE | Freq: Every day | ORAL | Status: DC
  Administered 2023-03-15 – 2023-03-20 (×6): 0.4 mg via ORAL
  Filled 2023-03-15 (×6): qty 1

## 2023-03-15 MED ORDER — LACTATED RINGERS IV SOLN: INTRAVENOUS | Status: DC

## 2023-03-15 MED ORDER — AMLODIPINE BESYLATE 10 MG PO TABS
10.0000 mg | ORAL_TABLET | Freq: Every day | ORAL | Status: DC
  Administered 2023-03-15 – 2023-03-20 (×6): 10 mg via ORAL
  Filled 2023-03-15 (×6): qty 1

## 2023-03-15 MED ORDER — LIDOCAINE 2% (20 MG/ML) 5 ML SYRINGE
INTRAMUSCULAR | Status: AC
  Filled 2023-03-15: qty 5

## 2023-03-15 MED ORDER — METHOCARBAMOL 500 MG PO TABS
500.0000 mg | ORAL_TABLET | Freq: Four times a day (QID) | ORAL | Status: DC | PRN
  Administered 2023-03-17: 500 mg via ORAL
  Filled 2023-03-15: qty 1

## 2023-03-15 MED ORDER — CEFAZOLIN SODIUM-DEXTROSE 2-4 GM/100ML-% IV SOLN
2.0000 g | Freq: Three times a day (TID) | INTRAVENOUS | Status: DC
  Administered 2023-03-15: 2 g via INTRAVENOUS
  Filled 2023-03-15: qty 100

## 2023-03-15 MED ORDER — CHLORHEXIDINE GLUCONATE 4 % EX SOLN: 60.0000 mL | Freq: Once | CUTANEOUS | Status: DC

## 2023-03-15 MED ORDER — OXYCODONE HCL 5 MG/5ML PO SOLN: 5.0000 mg | Freq: Once | ORAL | Status: DC | PRN

## 2023-03-15 MED ORDER — HYDROMORPHONE HCL 1 MG/ML IJ SOLN: 0.5000 mg | INTRAMUSCULAR | Status: DC | PRN

## 2023-03-15 MED ORDER — ACETAMINOPHEN 650 MG RE SUPP: 650.0000 mg | Freq: Four times a day (QID) | RECTAL | Status: DC | PRN

## 2023-03-15 MED ORDER — VANCOMYCIN HCL 1000 MG IV SOLR
INTRAVENOUS | Status: DC | PRN
  Administered 2023-03-15: 1000 mg via TOPICAL

## 2023-03-15 MED ORDER — ATORVASTATIN CALCIUM 40 MG PO TABS
40.0000 mg | ORAL_TABLET | Freq: Every evening | ORAL | Status: DC
  Administered 2023-03-15 – 2023-03-19 (×5): 40 mg via ORAL
  Filled 2023-03-15 (×5): qty 1

## 2023-03-15 MED ORDER — METOCLOPRAMIDE HCL 5 MG/ML IJ SOLN: 5.0000 mg | Freq: Three times a day (TID) | INTRAMUSCULAR | Status: DC | PRN

## 2023-03-15 MED ORDER — AMISULPRIDE (ANTIEMETIC) 5 MG/2ML IV SOLN: 10.0000 mg | Freq: Once | INTRAVENOUS | Status: DC | PRN

## 2023-03-15 MED ORDER — PHENYLEPHRINE 80 MCG/ML (10ML) SYRINGE FOR IV PUSH (FOR BLOOD PRESSURE SUPPORT)
PREFILLED_SYRINGE | INTRAVENOUS | Status: AC
  Filled 2023-03-15: qty 30

## 2023-03-15 MED ORDER — ALLOPURINOL 100 MG PO TABS
100.0000 mg | ORAL_TABLET | Freq: Every day | ORAL | Status: DC
  Administered 2023-03-15 – 2023-03-20 (×6): 100 mg via ORAL
  Filled 2023-03-15 (×6): qty 1

## 2023-03-15 MED ORDER — ONDANSETRON HCL 4 MG/2ML IJ SOLN
INTRAMUSCULAR | Status: DC | PRN
  Administered 2023-03-15: 4 mg via INTRAVENOUS

## 2023-03-15 MED ORDER — POLYETHYLENE GLYCOL 3350 17 G PO PACK: 17.0000 g | PACK | Freq: Every day | ORAL | Status: DC | PRN

## 2023-03-15 MED ORDER — FENTANYL CITRATE (PF) 250 MCG/5ML IJ SOLN
INTRAMUSCULAR | Status: AC
  Filled 2023-03-15: qty 5

## 2023-03-15 MED ORDER — PROPOFOL 10 MG/ML IV BOLUS
INTRAVENOUS | Status: DC | PRN
  Administered 2023-03-15: 110 mg via INTRAVENOUS
  Administered 2023-03-15: 20 mg via INTRAVENOUS
  Administered 2023-03-15: 40 mg via INTRAVENOUS

## 2023-03-15 MED ORDER — POVIDONE-IODINE 10 % EX SWAB: 2.0000 | Freq: Once | CUTANEOUS | Status: DC

## 2023-03-15 MED ORDER — ONDANSETRON HCL 4 MG PO TABS: 4.0000 mg | ORAL_TABLET | Freq: Four times a day (QID) | ORAL | Status: DC | PRN

## 2023-03-15 MED ORDER — METOCLOPRAMIDE HCL 5 MG PO TABS: 5.0000 mg | ORAL_TABLET | Freq: Three times a day (TID) | ORAL | Status: DC | PRN

## 2023-03-15 MED ORDER — LEVOTHYROXINE SODIUM 100 MCG PO TABS
100.0000 ug | ORAL_TABLET | Freq: Every day | ORAL | Status: DC
  Administered 2023-03-15 – 2023-03-20 (×6): 100 ug via ORAL
  Filled 2023-03-15 (×6): qty 1

## 2023-03-15 MED ORDER — COLCHICINE 0.6 MG PO TABS
1.2000 mg | ORAL_TABLET | Freq: Once | ORAL | Status: AC
  Administered 2023-03-15: 1.2 mg via ORAL
  Filled 2023-03-15: qty 2

## 2023-03-15 MED ORDER — LISINOPRIL 5 MG PO TABS
5.0000 mg | ORAL_TABLET | Freq: Two times a day (BID) | ORAL | Status: DC
  Administered 2023-03-15 – 2023-03-20 (×12): 5 mg via ORAL
  Filled 2023-03-15 (×12): qty 1

## 2023-03-15 MED ORDER — ACETAMINOPHEN 325 MG PO TABS
650.0000 mg | ORAL_TABLET | Freq: Four times a day (QID) | ORAL | Status: DC | PRN
  Administered 2023-03-15 – 2023-03-19 (×2): 650 mg via ORAL
  Filled 2023-03-15 (×2): qty 2

## 2023-03-15 MED ORDER — ORAL CARE MOUTH RINSE: 15.0000 mL | Freq: Once | OROMUCOSAL | Status: AC

## 2023-03-15 MED ORDER — ONDANSETRON HCL 4 MG/2ML IJ SOLN: 4.0000 mg | Freq: Four times a day (QID) | INTRAMUSCULAR | Status: DC | PRN

## 2023-03-15 MED ORDER — ASPIRIN 325 MG PO TBEC
325.0000 mg | DELAYED_RELEASE_TABLET | Freq: Every day | ORAL | Status: DC
  Administered 2023-03-15 – 2023-03-20 (×6): 325 mg via ORAL
  Filled 2023-03-15 (×6): qty 1

## 2023-03-15 MED ORDER — ENOXAPARIN SODIUM 40 MG/0.4ML IJ SOSY
40.0000 mg | PREFILLED_SYRINGE | Freq: Every day | INTRAMUSCULAR | Status: DC
  Administered 2023-03-15: 40 mg via SUBCUTANEOUS
  Filled 2023-03-15: qty 0.4

## 2023-03-15 MED ORDER — OXYCODONE HCL 5 MG PO TABS: 5.0000 mg | ORAL_TABLET | Freq: Once | ORAL | Status: DC | PRN

## 2023-03-15 MED ORDER — FENTANYL CITRATE (PF) 250 MCG/5ML IJ SOLN
INTRAMUSCULAR | Status: DC | PRN
  Administered 2023-03-15 (×2): 25 ug via INTRAVENOUS
  Administered 2023-03-15: 50 ug via INTRAVENOUS
  Administered 2023-03-15: 25 ug via INTRAVENOUS
  Administered 2023-03-15: 50 ug via INTRAVENOUS

## 2023-03-15 MED ORDER — COLCHICINE 0.6 MG PO TABS
0.6000 mg | ORAL_TABLET | Freq: Once | ORAL | Status: AC
  Administered 2023-03-15: 0.6 mg via ORAL
  Filled 2023-03-15: qty 1

## 2023-03-15 MED ORDER — BRIMONIDINE TARTRATE 0.2 % OP SOLN
1.0000 [drp] | Freq: Two times a day (BID) | OPHTHALMIC | Status: DC
  Administered 2023-03-15 – 2023-03-20 (×12): 1 [drp] via OPHTHALMIC
  Filled 2023-03-15 (×4): qty 5

## 2023-03-15 SURGICAL SUPPLY — 52 items
APL PRP STRL LF DISP 70% ISPRP (MISCELLANEOUS) ×1
BAG COUNTER SPONGE SURGICOUNT (BAG) ×1 IMPLANT
BAG SPNG CNTER NS LX DISP (BAG) ×1
BNDG CMPR MED 10X6 ELC LF (GAUZE/BANDAGES/DRESSINGS) ×2
BNDG COHESIVE 4X5 TAN STRL (GAUZE/BANDAGES/DRESSINGS) ×1 IMPLANT
BNDG ELASTIC 6X10 VLCR STRL LF (GAUZE/BANDAGES/DRESSINGS) IMPLANT
BNDG GAUZE DERMACEA FLUFF 4 (GAUZE/BANDAGES/DRESSINGS) ×2 IMPLANT
BNDG GZE DERMACEA 4 6PLY (GAUZE/BANDAGES/DRESSINGS)
BRUSH SCRUB EZ PLAIN DRY (MISCELLANEOUS) ×2 IMPLANT
CHLORAPREP W/TINT 26 (MISCELLANEOUS) ×1 IMPLANT
COVER MAYO STAND STRL (DRAPES) ×1 IMPLANT
COVER SURGICAL LIGHT HANDLE (MISCELLANEOUS) ×2 IMPLANT
DRAPE ORTHO SPLIT 77X108 STRL (DRAPES) ×1
DRAPE SURG 17X23 STRL (DRAPES) ×1 IMPLANT
DRAPE SURG ORHT 6 SPLT 77X108 (DRAPES) ×1 IMPLANT
DRAPE U-SHAPE 47X51 STRL (DRAPES) ×1 IMPLANT
DRSG ADAPTIC 3X8 NADH LF (GAUZE/BANDAGES/DRESSINGS) ×1 IMPLANT
DRSG MEPITEL 4X7.2 (GAUZE/BANDAGES/DRESSINGS) IMPLANT
DRSG TEGADERM 4X4.5 CHG (GAUZE/BANDAGES/DRESSINGS) IMPLANT
ELECT REM PT RETURN 9FT ADLT (ELECTROSURGICAL) IMPLANT
ELECTRODE REM PT RTRN 9FT ADLT (ELECTROSURGICAL) IMPLANT
EVACUATOR 1/8 PVC DRAIN (DRAIN) IMPLANT
GAUZE SPONGE 4X4 12PLY STRL (GAUZE/BANDAGES/DRESSINGS) ×1 IMPLANT
GAUZE SPONGE 4X4 12PLY STRL LF (GAUZE/BANDAGES/DRESSINGS) IMPLANT
GLOVE BIO SURGEON STRL SZ 6.5 (GLOVE) ×3 IMPLANT
GLOVE BIO SURGEON STRL SZ7.5 (GLOVE) ×4 IMPLANT
GLOVE BIOGEL PI IND STRL 6.5 (GLOVE) ×1 IMPLANT
GLOVE BIOGEL PI IND STRL 7.5 (GLOVE) ×1 IMPLANT
GOWN STRL REUS W/ TWL LRG LVL3 (GOWN DISPOSABLE) ×2 IMPLANT
GOWN STRL REUS W/TWL LRG LVL3 (GOWN DISPOSABLE) ×2
HANDPIECE INTERPULSE COAX TIP (DISPOSABLE)
KIT BASIN OR (CUSTOM PROCEDURE TRAY) ×1 IMPLANT
KIT TURNOVER KIT B (KITS) ×1 IMPLANT
MANIFOLD NEPTUNE II (INSTRUMENTS) ×1 IMPLANT
NS IRRIG 1000ML POUR BTL (IV SOLUTION) ×1 IMPLANT
PACK ORTHO EXTREMITY (CUSTOM PROCEDURE TRAY) ×1 IMPLANT
PAD ARMBOARD 7.5X6 YLW CONV (MISCELLANEOUS) ×2 IMPLANT
PADDING CAST COTTON 6X4 STRL (CAST SUPPLIES) ×1 IMPLANT
SET HNDPC FAN SPRY TIP SCT (DISPOSABLE) IMPLANT
SPONGE T-LAP 18X18 ~~LOC~~+RFID (SPONGE) ×1 IMPLANT
SUT ETHILON 2 0 FS 18 (SUTURE) ×2 IMPLANT
SUT ETHILON 3 0 FSL (SUTURE) IMPLANT
SUT ETHILON 3 0 PS 1 (SUTURE) ×2 IMPLANT
SUT MON AB 2-0 CT1 36 (SUTURE) ×1 IMPLANT
SUT PDS AB 0 CT 36 (SUTURE) IMPLANT
SWAB CULTURE ESWAB REG 1ML (MISCELLANEOUS) IMPLANT
TOWEL GREEN STERILE (TOWEL DISPOSABLE) ×2 IMPLANT
TOWEL GREEN STERILE FF (TOWEL DISPOSABLE) ×1 IMPLANT
TUBE CONNECTING 12X1/4 (SUCTIONS) ×1 IMPLANT
UNDERPAD 30X36 HEAVY ABSORB (UNDERPADS AND DIAPERS) ×1 IMPLANT
WATER STERILE IRR 1000ML POUR (IV SOLUTION) ×1 IMPLANT
YANKAUER SUCT BULB TIP NO VENT (SUCTIONS) ×1 IMPLANT

## 2023-03-15 NOTE — H&P (Addendum)
PCP:   Angelique Blonder, MD   Chief Complaint:  Left knee pain  HPI: This is a 82-year-old male with past medical history of gout, HTN, HLD, BPH, hypothyroidism, throat cancer, positive ANA, positive rheumatoid factor.  On 7/29 patient saw rheumatologist with complaints of left knee pain and swelling.  At that point patient's knee was tapped and sent for synovial fluid analysis.  Patient is given intra-articular steroid joint infection.  Fluid analysis shows gout, he was started on allopurinol.  On 7/31 patient recontacted rheumatologist as his pain and swelling persisted.  He was prescribed a 9-day prednisone taper.  Per patient a temporal improvement then his pain worsened.  He was not getting around with a walker.  He had difficulty getting in bed, out of bed, around the house.  He returned to the ER 03/11/2023.  In the ER he had a repeat arthrocentesis done.  He was discharged with hydrocodone.  He was given orthopedics referral.  Today he developed nausea and vomiting, fever Tmax 100.7.  Patient took a Tylenol, came to the ER.  Patient's knee remained painful. Patient's initial joint aspiration showed done 7/29 showed 75,000 WBCs, cultures grew pansensitive MSSA and was positive for monosodium urate.  Repeat done 03/11/2023 showed 26,000 WBCs rare gram-positive cocci, no crystals noted. ESR 14.1, CRP 97  In the ER, patient had a repeat tap of his left knee, patient afebrile.  Left knee remains painful with much decreased range of motion.  Orthopedics on-call Dr. Susa Simmonds consulted and aware.  Will see patient in AM.  Antibiotics started.  Review of Systems:  Per HPI  Past Medical History: Past Medical History:  Diagnosis Date   Eye abnormalities    GERD (gastroesophageal reflux disease)    Hypertension    Kidney stones    Prostate cancer (HCC)    Throat cancer (HCC)    Past Surgical History:  Procedure Laterality Date   CAROTID STENT     EYE SURGERY     TONSILLECTOMY       Medications: Prior to Admission medications   Medication Sig Start Date End Date Taking? Authorizing Provider  acetaminophen (TYLENOL) 500 MG tablet Take 1,000 mg by mouth 2 (two) times daily as needed for mild pain or moderate pain.    [provider]  allopurinol (ZYLOPRIM) 100 MG tablet Take 1 tablet (100 mg total) by mouth daily. 03/05/23   Fuller Plan, MD  amLODipine (NORVASC) 10 MG tablet Take 10 mg by mouth daily.    [provider]  ascorbic acid (VITAMIN C) 500 MG tablet Take 1,000 mg by mouth daily.    [provider]  aspirin EC 325 MG tablet Take 325 mg by mouth daily.    [provider]  atorvastatin (LIPITOR) 40 MG tablet Take 40 mg by mouth every evening.    [provider]  brimonidine (ALPHAGAN) 0.2 % ophthalmic solution Place 1 drop into the right eye 2 (two) times daily.    [provider]  fluticasone (FLONASE) 50 MCG/ACT nasal spray Place 2 sprays into the nose daily as needed for allergies.    [provider]  HYDROcodone-acetaminophen (NORCO) 5-325 MG tablet Take 1 tablet by mouth every 6 (six) hours as needed for severe pain. 03/11/23   Pricilla Loveless, MD  Iron Combinations (IRON COMPLEX PO) Take 1 tablet by mouth daily.    [provider]  levothyroxine (SYNTHROID) 100 MCG tablet Take 100 mcg by mouth daily.    [provider]  lisinopril (ZESTRIL) 5 MG tablet Take 5 mg by mouth 2 (two) times daily.    [provider]  Multiple Vitamins-Minerals (ONE-A-DAY 50 PLUS PO) Take 1 tablet by mouth daily.    [provider]  Omega-3 Fatty Acids (FISH OIL) 1200 MG CAPS Take 1,200 mg by mouth daily.    [provider]  polyethylene glycol (MIRALAX) 17 g packet Take 17 g by mouth daily. Patient not taking: Reported on 03/11/2023 01/29/23   Long, Arlyss Repress, MD  predniSONE (DELTASONE) 20 MG tablet Take 3 tablets (60 mg total) by mouth daily with breakfast for 3 days, THEN  2 tablets (40 mg total) daily with breakfast for 3 days, THEN 1 tablet (20 mg total) daily with breakfast for 3 days. 03/06/23 03/15/23  Fuller Plan, MD  tamsulosin (FLOMAX) 0.4 MG CAPS capsule Take 0.4 mg by mouth daily.    [provider]    Allergies:  No Known Allergies  Social History:  reports that he has quit smoking. His smoking use included cigarettes. He started smoking about 59 years ago. He has a 812.1 pack-year smoking history. He has been exposed to tobacco smoke. He has never used smokeless tobacco. He reports that he does not drink alcohol and does not use drugs.  Family History: Family History  Problem Relation Age of Onset   Stroke Mother    Heart attack Mother    Heart disease Brother     Physical Exam: Vitals:   03/14/23 2129 03/14/23 2130 03/14/23 2330  BP:  134/61   Pulse:  82 73  Resp:  18   Temp: 98.1 F (36.7 C)  98.2 F (36.8 C)  TempSrc: Oral    SpO2:  92% 94%  Weight: 108 kg    Height: 6\' 4"  (1.93 m)      General:  Alert and oriented times three, ill-appearing, weak elderly gentleman Eyes: Pink conjunctiva, no scleral icterus ENT: Moist oral mucosa, neck supple, no thyromegaly Lungs: CTA B/L,  no use of accessory muscles Cardiovascular: RRR, no tachycardia, no regurgitation or no murmurs. No carotid bruits or JVD Abdomen: soft, positive BS, NTND, not an acute abdomen GU: not examined Neuro: CN II - XII grossly intact, sensation intact Musculoskeletal: Swollen left knee.  No significant erythema or warmth.  Swollen, prepatellar effusion.  Much decreased range of motion d/t pain.  Positive TTP generalized knee Skin: no rash, no subcutaneous crepitation, no decubitus Psych: appropriate patient  Labs on Admission:  Recent Labs    03/14/23 2155  NA 137  K 4.2  CL 100  CO2 27  GLUCOSE 155*  BUN 23  CREATININE 0.86  CALCIUM 9.0   Recent Labs    03/14/23 2155  AST 52*  ALT 62*  ALKPHOS 138*  BILITOT 0.5  PROT 5.7*   ALBUMIN 1.9*    Recent Labs    03/14/23 2155  WBC 9.5  HGB 12.7*  HCT 39.5  MCV 87.4  PLT 369     Micro Results: Recent Results (from the past 240 hour(s))  Body fluid culture w Gram Stain     Status: None   Collection Time: 03/11/23  8:39 AM   Specimen: Synovium; Body Fluid  Result Value Ref Range Status   Specimen Description SYNOVIAL  Final   Special Requests LEFT KNEE  Final   Gram Stain   Final    MODERATE WBC PRESENT, PREDOMINANTLY PMN NO ORGANISMS SEEN Performed at [facility], [address] [place], [place] [address]    Culture FEW STAPHYLOCOCCUS AUREUS  Final   Report Status 03/14/2023 FINAL  Final   Organism ID, Bacteria STAPHYLOCOCCUS AUREUS  Final      Susceptibility   Staphylococcus aureus - MIC*    CIPROFLOXACIN 1 SENSITIVE Sensitive     ERYTHROMYCIN <=0.25 SENSITIVE Sensitive     GENTAMICIN <=0.5 SENSITIVE Sensitive     OXACILLIN <=0.25 SENSITIVE Sensitive     TETRACYCLINE <=1 SENSITIVE Sensitive     VANCOMYCIN <=0.5 SENSITIVE Sensitive     TRIMETH/SULFA <=10 SENSITIVE Sensitive     CLINDAMYCIN <=0.25 SENSITIVE Sensitive     RIFAMPIN <=0.5 SENSITIVE Sensitive     Inducible Clindamycin NEGATIVE Sensitive     LINEZOLID 2 SENSITIVE Sensitive     * FEW STAPHYLOCOCCUS AUREUS     Radiological Exams on Admission: No results found.  Assessment/Plan Present on Admission: Left knee joint infection/MSSA  -Maintain n.p.o. status -Ortho consulted, will see patient in a.m. -Continue IV antibiotics Ancef.  Currently no evidence of MRSA -Continue pain medications as needed -ESR, CRP  Acute gouty arthropathy -Continue allopurinol, add colchicine -May need to continue steroids but currently has not improved symptoms.  Continue treatment for infection -Monitor LFTs with colchicine.  Elevated LFTs // Hypoalbuminemia -New.  Possibly due to sepsis/infection.  Monitor. -CMP in a.m.  Hyperglycemia -No history of diabetes mellitus.  May be due to  steroids -CMP in a.m.  HTN -Home medication lisinopril, Norvasc resumed  HLD -Stable, atorvastatin resumed  BPH -Stable, Flomax resumed  Hypothyroidism -Stable, Synthroid resumed  History of throat cancer  Cataracts -Drops resumed.   ,  03/15/2023, 12:07 AM

## 2023-03-15 NOTE — H&P (View-Only) (Signed)
Reason for Consult:Left knee pain Referring Physician: Lanae Boast Time called: 0730 Time at bedside: 0909   Dean Mayer is an 82 y.o. male.  HPI: [name] comes in with a 2 week hx/o left knee pain. Initially he went to see his rheumatologist once it swelled and was tapped. Result was gout and he was started on allopurinol. The next day it had swelled again. He was then started on prednisone. These seemed to help somewhat with the swelling but not with the pain. He came to the ED earlier this week as he hadn't gotten better. He had another tap that showed crystals and relatively low WBC and he was discharged to f/u with rheumatology. However, this tap grew MSSA. He returned to the ED and was admitted. Another tap yesterday had somewhat higher WBC but also GPC and orthopedic surgery was consulted. He lives at home with his wife and has been ambulating with a RW.  Past Medical History:  Diagnosis Date   Eye abnormalities    GERD (gastroesophageal reflux disease)    Hypertension    Kidney stones    Prostate cancer (HCC)    Throat cancer [facility])     Past Surgical History:  Procedure Laterality Date   CAROTID STENT     EYE SURGERY     TONSILLECTOMY      Family History  Problem Relation Age of Onset   Stroke Mother    Heart attack Mother    Heart disease Brother     Social History:  reports that he has quit smoking. His smoking use included cigarettes. He started smoking about 59 years ago. He has a 812.1 pack-year smoking history. He has been exposed to tobacco smoke. He has never used smokeless tobacco. He reports that he does not drink alcohol and does not use drugs.  Allergies: No Known Allergies  Medications: I have reviewed the patient's current medications.  Results for orders placed or performed during the hospital encounter of 03/14/23 (from the past 48 hour(s))  Blood culture (routine x 2)     Status: None (Preliminary result)   Collection Time: 03/14/23  9:39 PM    Specimen: BLOOD  Result Value Ref Range   Specimen Description BLOOD RIGHT ANTECUBITAL    Special Requests      BOTTLES DRAWN AEROBIC AND ANAEROBIC Blood Culture results may not be optimal due to an excessive volume of blood received in culture bottles   Culture      NO GROWTH < 12 HOURS Performed at [facility], [address] [place], [place] [address]    Report Status PENDING   Body fluid culture w Gram Stain     Status: None (Preliminary result)   Collection Time: 03/14/23  9:39 PM   Specimen: Joint, Knee; Body Fluid  Result Value Ref Range   Specimen Description JOINT FLUID KNEE    Special Requests NONE    Gram Stain      MODERATE WBC PRESENT,BOTH PMN AND MONONUCLEAR RARE GRAM POSITIVE COCCI Performed at [facility], [address] [place], [place] [address]    Culture PENDING    Report Status PENDING   Sedimentation rate     Status: Abnormal   Collection Time: 03/14/23  9:55 PM  Result Value Ref Range   Sed Rate 97 (H) 0 - 16 mm/hr    Comment: Performed at [facility], [address] [place], [place] [address]  C-reactive protein     Status:  Abnormal   Collection Time: 03/14/23  9:55 PM  Result Value Ref Range   CRP 14.1 (H) <1.0 mg/dL    Comment: Performed at [facility], [address] [place], [place] [address]  CBC     Status: Abnormal   Collection Time: 03/14/23  9:55 PM  Result Value Ref Range   WBC 9.5 4.0 - 10.5 K/uL   RBC 4.52 4.22 - 5.81 MIL/uL   Hemoglobin 12.7 (L) 13.0 - 17.0 g/dL   HCT 29.5 28.4 - 13.2 %   MCV 87.4 80.0 - 100.0 fL   MCH 28.1 26.0 - 34.0 pg   MCHC 32.2 30.0 - 36.0 g/dL   RDW 44.0 10.2 - 72.5 %   Platelets 369 150 - 400 K/uL   nRBC 0.0 0.0 - 0.2 %    Comment: Performed at [facility], [address] [place], [place] [address]  Comprehensive metabolic panel     Status: Abnormal   Collection Time: 03/14/23  9:55 PM  Result Value Ref Range   Sodium 137 135 - 145 mmol/L   Potassium 4.2 3.5 - 5.1  mmol/L   Chloride 100 98 - 111 mmol/L   CO2 27 22 - 32 mmol/L   Glucose, Bld 155 (H) 70 - 99 mg/dL    Comment: Glucose reference range applies only to samples taken after fasting for at least 8 hours.   BUN 23 8 - 23 mg/dL   Creatinine, Ser 0.34 0.61 - 1.24 mg/dL   Calcium 9.0 8.9 - 74.2 mg/dL   Total Protein 5.7 (L) 6.5 - 8.1 g/dL   Albumin 1.9 (L) 3.5 - 5.0 g/dL   AST 52 (H) 15 - 41 U/L   ALT 62 (H) 0 - 44 U/L   Alkaline Phosphatase 138 (H) 38 - 126 U/L   Total Bilirubin 0.5 0.3 - 1.2 mg/dL   GFR, Estimated >59 >56 mL/min    Comment: (NOTE) Calculated using the CKD-EPI Creatinine Equation (2021)    Anion gap 10 5 - 15    Comment: Performed at [facility], [address] [place], [place] [address]  Uric acid     Status: None   Collection Time: 03/14/23  9:55 PM  Result Value Ref Range   Uric Acid, Serum 4.8 3.7 - 8.6 mg/dL    Comment: Performed at [facility], [address], [place], [place] [address]  Synovial cell count + diff, w/ crystals     Status: Abnormal   Collection Time: 03/14/23 10:35 PM  Result Value Ref Range   Color, Synovial RED (A) YELLOW   Appearance-Synovial TURBID (A) CLEAR   Crystals, Fluid INTRACELLULAR MONOSODIUM URATE CRYSTALS    WBC, Synovial 35,550 (H) 0 - 200 /cu mm   Neutrophil, Synovial 94 (H) 0 - 25 %   Lymphocytes-Synovial Fld 3 0 - 20 %   Monocyte-Macrophage-Synovial Fluid 3 (L) 50 - 90 %   Eosinophils-Synovial 0 0 - 1 %    Comment: Performed at [facility], [address], [place], [place] [address]  Comprehensive metabolic panel     Status: Abnormal   Collection Time: 03/15/23  3:02 AM  Result Value Ref Range   Sodium 134 (L) 135 - 145 mmol/L   Potassium 3.8 3.5 - 5.1 mmol/L   Chloride 98 98 - 111 mmol/L   CO2 25 22 - 32 mmol/L   Glucose, Bld 141 (H) 70 - 99 mg/dL    Comment:  Glucose reference range applies only to samples taken after fasting for at least 8 hours.   BUN 21 8 - 23 mg/dL   Creatinine, Ser 5.36 0.61  - 1.24 mg/dL   Calcium 8.4 (L) 8.9 - 10.3 mg/dL   Total Protein 5.8 (L) 6.5 - 8.1 g/dL   Albumin 1.9 (L) 3.5 - 5.0 g/dL   AST 38 15 - 41 U/L   ALT 53 (H) 0 - 44 U/L   Alkaline Phosphatase 120 38 - 126 U/L   Total Bilirubin 0.7 0.3 - 1.2 mg/dL   GFR, Estimated >64 >40 mL/min    Comment: (NOTE) Calculated using the CKD-EPI Creatinine Equation (2021)    Anion gap 11 5 - 15    Comment: Performed at [facility], [address], [place], [place] [address]  Protime-INR     Status: None   Collection Time: 03/15/23  3:02 AM  Result Value Ref Range   Prothrombin Time 13.9 11.4 - 15.2 seconds   INR 1.1 0.8 - 1.2    Comment: (NOTE) INR goal varies based on device and disease states. Performed at [facility], [address], [place], [place] [address]   C-reactive protein     Status: Abnormal   Collection Time: 03/15/23  3:02 AM  Result Value Ref Range   CRP 13.2 (H) <1.0 mg/dL    Comment: Performed at [facility], [address], [place], [place] [address]  CBC with Differential/Platelet     Status: Abnormal   Collection Time: 03/15/23  3:59 AM  Result Value Ref Range   WBC 8.6 4.0 - 10.5 K/uL   RBC 4.28 4.22 - 5.81 MIL/uL   Hemoglobin 12.2 (L) 13.0 - 17.0 g/dL    Comment: REPEATED TO VERIFY RECOLLECT. PREVIOUS SAMPLE QUESTIONABLE RESULTS.    HCT 37.3 (L) 39.0 - 52.0 %   MCV 87.1 80.0 - 100.0 fL   MCH 28.5 26.0 - 34.0 pg   MCHC 32.7 30.0 - 36.0 g/dL   RDW 33.2 95.1 - 88.4 %   Platelets 357 150 - 400 K/uL   nRBC 0.0 0.0 - 0.2 %   Neutrophils Relative % 83 %   Neutro Abs 7.1 1.7 - 7.7 K/uL   Lymphocytes Relative 9 %   Lymphs Abs 0.8 0.7 - 4.0 K/uL   Monocytes Relative 6 %   Monocytes Absolute 0.5 0.1 - 1.0 K/uL   Eosinophils Relative 1 %   Eosinophils Absolute 0.1 0.0 - 0.5 K/uL   Basophils Relative 0 %   Basophils Absolute 0.0 0.0 - 0.1 K/uL   Immature Granulocytes 1 %   Abs Immature Granulocytes 0.06 0.00 - 0.07 K/uL    Comment: Performed at [facility], [address] [place], [place] [address]  Sedimentation rate     Status: Abnormal   Collection Time: 03/15/23  3:59 AM  Result Value Ref Range   Sed Rate 70 (H) 0 - 16 mm/hr    Comment: Performed at [facility], [address], [place], [place] [address]    No results found.  Review of Systems  Constitutional:  Positive for fever. Negative for chills and diaphoresis.  HENT:  Negative for ear discharge, ear pain, hearing loss and tinnitus.   Eyes:  Negative for photophobia and pain.  Respiratory:  Negative for cough and shortness of breath.   Cardiovascular:  Negative for chest pain.  Gastrointestinal:  Positive for nausea and vomiting. Negative for  abdominal pain.  Genitourinary:  Negative for dysuria, flank pain, frequency and urgency.  Musculoskeletal:  Positive for arthralgias (Left knee). Negative for back pain, myalgias and neck pain.  Neurological:  Negative for dizziness and headaches.  Hematological:  Does not bruise/bleed easily.  Psychiatric/Behavioral:  The patient is not nervous/anxious.    Blood pressure (!) 168/63, pulse 71, temperature 98.2 F (36.8 C), temperature source Oral, resp. rate 18, height 6\' 4"  (1.93 m), weight 108 kg, SpO2 96%. Physical Exam Constitutional:      General: He is not in acute distress.    Appearance: He is well-developed. He is not diaphoretic.  HENT:     Head: Normocephalic and atraumatic.  Eyes:     General: No scleral icterus.       Right eye: No discharge.        Left eye: No discharge.     Conjunctiva/sclera: Conjunctivae normal.  Cardiovascular:     Rate and Rhythm: Normal rate and regular rhythm.  Pulmonary:     Effort: Pulmonary effort is normal. No respiratory distress.  Musculoskeletal:     Cervical back: Normal range of motion.     Comments: LLE No traumatic wounds, ecchymosis, or rash  Severe TTP, severe pain with A/PROM  Large knee effusion  Sens DPN, SPN, TN intact  Motor EHL, ext, flex, evers  5/5  DP 2+, PT 1+, No significant edema  Skin:    General: Skin is warm and dry.  Neurological:     Mental Status: He is alert.  Psychiatric:        Mood and Affect: Mood normal.        Behavior: Behavior normal.     Assessment/Plan: Left knee septic joint/gout -- Plan on I&D today with Dr. Jena Gauss. Please keep NPO. Multiple medical problems including gout, HTN, HLD, BPH, hypothyroidism, throat cancer, positive ANA, and positive rheumatoid factor -- per primary service    Freeman Caldron, PA-C Orthopedic Surgery [phone] 03/15/2023, 9:25 AM

## 2023-03-15 NOTE — Consult Note (Signed)
Reason for Consult:Left knee pain Referring Physician: Lanae Boast Time called: 0730 Time at bedside: 0909   Dean Mayer is an 82 y.o. male.  HPI: Dean Mayer comes in with a 2 week hx/o left knee pain. Initially he went to see his rheumatologist once it swelled and was tapped. Result was gout and he was started on allopurinol. The next day it had swelled again. He was then started on prednisone. These seemed to help somewhat with the swelling but not with the pain. He came to the ED earlier this week as he hadn't gotten better. He had another tap that showed crystals and relatively low WBC and he was discharged to f/u with rheumatology. However, this tap grew MSSA. He returned to the ED and was admitted. Another tap yesterday had somewhat higher WBC but also GPC and orthopedic surgery was consulted. He lives at home with his wife and has been ambulating with a RW.  Past Medical History:  Diagnosis Date   Eye abnormalities    GERD (gastroesophageal reflux disease)    Hypertension    Kidney stones    Prostate cancer (HCC)    Throat cancer [facility])     Past Surgical History:  Procedure Laterality Date   CAROTID STENT     EYE SURGERY     TONSILLECTOMY      Family History  Problem Relation Age of Onset   Stroke Mother    Heart attack Mother    Heart disease Brother     Social History:  reports that he has quit smoking. His smoking use included cigarettes. He started smoking about 59 years ago. He has a 812.1 pack-year smoking history. He has been exposed to tobacco smoke. He has never used smokeless tobacco. He reports that he does not drink alcohol and does not use drugs.  Allergies: No Known Allergies  Medications: I have reviewed the patient's current medications.  Results for orders placed or performed during the hospital encounter of 03/14/23 (from the past 48 hour(s))  Blood culture (routine x 2)     Status: None (Preliminary result)   Collection Time: 03/14/23  9:39 PM    Specimen: BLOOD  Result Value Ref Range   Specimen Description BLOOD RIGHT ANTECUBITAL    Special Requests      BOTTLES DRAWN AEROBIC AND ANAEROBIC Blood Culture results may not be optimal due to an excessive volume of blood received in culture bottles   Culture      NO GROWTH < 12 HOURS Performed at [facility], [address] [place], [place] [address]    Report Status PENDING   Body fluid culture w Gram Stain     Status: None (Preliminary result)   Collection Time: 03/14/23  9:39 PM   Specimen: Joint, Knee; Body Fluid  Result Value Ref Range   Specimen Description JOINT FLUID KNEE    Special Requests NONE    Gram Stain      MODERATE WBC PRESENT,BOTH PMN AND MONONUCLEAR RARE GRAM POSITIVE COCCI Performed at [facility], [address] [place], [place] [address]    Culture PENDING    Report Status PENDING   Sedimentation rate     Status: Abnormal   Collection Time: 03/14/23  9:55 PM  Result Value Ref Range   Sed Rate 97 (H) 0 - 16 mm/hr    Comment: Performed at [facility], [address] [place], [place] [address]  C-reactive protein     Status:  Abnormal   Collection Time: 03/14/23  9:55 PM  Result Value Ref Range   CRP 14.1 (H) <1.0 mg/dL    Comment: Performed at [facility], [address] [place], [place] [address]  CBC     Status: Abnormal   Collection Time: 03/14/23  9:55 PM  Result Value Ref Range   WBC 9.5 4.0 - 10.5 K/uL   RBC 4.52 4.22 - 5.81 MIL/uL   Hemoglobin 12.7 (L) 13.0 - 17.0 g/dL   HCT 29.5 28.4 - 13.2 %   MCV 87.4 80.0 - 100.0 fL   MCH 28.1 26.0 - 34.0 pg   MCHC 32.2 30.0 - 36.0 g/dL   RDW 44.0 10.2 - 72.5 %   Platelets 369 150 - 400 K/uL   nRBC 0.0 0.0 - 0.2 %    Comment: Performed at [facility], [address] [place], [place] [address]  Comprehensive metabolic panel     Status: Abnormal   Collection Time: 03/14/23  9:55 PM  Result Value Ref Range   Sodium 137 135 - 145 mmol/L   Potassium 4.2 3.5 - 5.1  mmol/L   Chloride 100 98 - 111 mmol/L   CO2 27 22 - 32 mmol/L   Glucose, Bld 155 (H) 70 - 99 mg/dL    Comment: Glucose reference range applies only to samples taken after fasting for at least 8 hours.   BUN 23 8 - 23 mg/dL   Creatinine, Ser 0.34 0.61 - 1.24 mg/dL   Calcium 9.0 8.9 - 74.2 mg/dL   Total Protein 5.7 (L) 6.5 - 8.1 g/dL   Albumin 1.9 (L) 3.5 - 5.0 g/dL   AST 52 (H) 15 - 41 U/L   ALT 62 (H) 0 - 44 U/L   Alkaline Phosphatase 138 (H) 38 - 126 U/L   Total Bilirubin 0.5 0.3 - 1.2 mg/dL   GFR, Estimated >59 >56 mL/min    Comment: (NOTE) Calculated using the CKD-EPI Creatinine Equation (2021)    Anion gap 10 5 - 15    Comment: Performed at [facility], [address] [place], [place] [address]  Uric acid     Status: None   Collection Time: 03/14/23  9:55 PM  Result Value Ref Range   Uric Acid, Serum 4.8 3.7 - 8.6 mg/dL    Comment: Performed at [facility], [address], [place], [place] [address]  Synovial cell count + diff, w/ crystals     Status: Abnormal   Collection Time: 03/14/23 10:35 PM  Result Value Ref Range   Color, Synovial RED (A) YELLOW   Appearance-Synovial TURBID (A) CLEAR   Crystals, Fluid INTRACELLULAR MONOSODIUM URATE CRYSTALS    WBC, Synovial 35,550 (H) 0 - 200 /cu mm   Neutrophil, Synovial 94 (H) 0 - 25 %   Lymphocytes-Synovial Fld 3 0 - 20 %   Monocyte-Macrophage-Synovial Fluid 3 (L) 50 - 90 %   Eosinophils-Synovial 0 0 - 1 %    Comment: Performed at [facility], [address], [place], [place] [address]  Comprehensive metabolic panel     Status: Abnormal   Collection Time: 03/15/23  3:02 AM  Result Value Ref Range   Sodium 134 (L) 135 - 145 mmol/L   Potassium 3.8 3.5 - 5.1 mmol/L   Chloride 98 98 - 111 mmol/L   CO2 25 22 - 32 mmol/L   Glucose, Bld 141 (H) 70 - 99 mg/dL    Comment:  Glucose reference range applies only to samples taken after fasting for at least 8 hours.   BUN 21 8 - 23 mg/dL   Creatinine, Ser 5.36 0.61  - 1.24 mg/dL   Calcium 8.4 (L) 8.9 - 10.3 mg/dL   Total Protein 5.8 (L) 6.5 - 8.1 g/dL   Albumin 1.9 (L) 3.5 - 5.0 g/dL   AST 38 15 - 41 U/L   ALT 53 (H) 0 - 44 U/L   Alkaline Phosphatase 120 38 - 126 U/L   Total Bilirubin 0.7 0.3 - 1.2 mg/dL   GFR, Estimated >64 >40 mL/min    Comment: (NOTE) Calculated using the CKD-EPI Creatinine Equation (2021)    Anion gap 11 5 - 15    Comment: Performed at [facility], [address], [place], [place] [address]  Protime-INR     Status: None   Collection Time: 03/15/23  3:02 AM  Result Value Ref Range   Prothrombin Time 13.9 11.4 - 15.2 seconds   INR 1.1 0.8 - 1.2    Comment: (NOTE) INR goal varies based on device and disease states. Performed at [facility], [address], [place], [place] [address]   C-reactive protein     Status: Abnormal   Collection Time: 03/15/23  3:02 AM  Result Value Ref Range   CRP 13.2 (H) <1.0 mg/dL    Comment: Performed at [facility], [address], [place], [place] [address]  CBC with Differential/Platelet     Status: Abnormal   Collection Time: 03/15/23  3:59 AM  Result Value Ref Range   WBC 8.6 4.0 - 10.5 K/uL   RBC 4.28 4.22 - 5.81 MIL/uL   Hemoglobin 12.2 (L) 13.0 - 17.0 g/dL    Comment: REPEATED TO VERIFY RECOLLECT. PREVIOUS SAMPLE QUESTIONABLE RESULTS.    HCT 37.3 (L) 39.0 - 52.0 %   MCV 87.1 80.0 - 100.0 fL   MCH 28.5 26.0 - 34.0 pg   MCHC 32.7 30.0 - 36.0 g/dL   RDW 33.2 95.1 - 88.4 %   Platelets 357 150 - 400 K/uL   nRBC 0.0 0.0 - 0.2 %   Neutrophils Relative % 83 %   Neutro Abs 7.1 1.7 - 7.7 K/uL   Lymphocytes Relative 9 %   Lymphs Abs 0.8 0.7 - 4.0 K/uL   Monocytes Relative 6 %   Monocytes Absolute 0.5 0.1 - 1.0 K/uL   Eosinophils Relative 1 %   Eosinophils Absolute 0.1 0.0 - 0.5 K/uL   Basophils Relative 0 %   Basophils Absolute 0.0 0.0 - 0.1 K/uL   Immature Granulocytes 1 %   Abs Immature Granulocytes 0.06 0.00 - 0.07 K/uL    Comment: Performed at [facility], [address] [place], [place] [address]  Sedimentation rate     Status: Abnormal   Collection Time: 03/15/23  3:59 AM  Result Value Ref Range   Sed Rate 70 (H) 0 - 16 mm/hr    Comment: Performed at [facility], [address], [place], [place] [address]    No results found.  Review of Systems  Constitutional:  Positive for fever. Negative for chills and diaphoresis.  HENT:  Negative for ear discharge, ear pain, hearing loss and tinnitus.   Eyes:  Negative for photophobia and pain.  Respiratory:  Negative for cough and shortness of breath.   Cardiovascular:  Negative for chest pain.  Gastrointestinal:  Positive for nausea and vomiting. Negative for  abdominal pain.  Genitourinary:  Negative for dysuria, flank pain, frequency and urgency.  Musculoskeletal:  Positive for arthralgias (Left knee). Negative for back pain, myalgias and neck pain.  Neurological:  Negative for dizziness and headaches.  Hematological:  Does not bruise/bleed easily.  Psychiatric/Behavioral:  The patient is not nervous/anxious.    Blood pressure (!) 168/63, pulse 71, temperature 98.2 F (36.8 C), temperature source Oral, resp. rate 18, height 6\' 4"  (1.93 m), weight 108 kg, SpO2 96%. Physical Exam Constitutional:      General: He is not in acute distress.    Appearance: He is well-developed. He is not diaphoretic.  HENT:     Head: Normocephalic and atraumatic.  Eyes:     General: No scleral icterus.       Right eye: No discharge.        Left eye: No discharge.     Conjunctiva/sclera: Conjunctivae normal.  Cardiovascular:     Rate and Rhythm: Normal rate and regular rhythm.  Pulmonary:     Effort: Pulmonary effort is normal. No respiratory distress.  Musculoskeletal:     Cervical back: Normal range of motion.     Comments: LLE No traumatic wounds, ecchymosis, or rash  Severe TTP, severe pain with A/PROM  Large knee effusion  Sens DPN, SPN, TN intact  Motor EHL, ext, flex, evers  5/5  DP 2+, PT 1+, No significant edema  Skin:    General: Skin is warm and dry.  Neurological:     Mental Status: He is alert.  Psychiatric:        Mood and Affect: Mood normal.        Behavior: Behavior normal.     Assessment/Plan: Left knee septic joint/gout -- Plan on I&D today with Dr. Jena Gauss. Please keep NPO. Multiple medical problems including gout, HTN, HLD, BPH, hypothyroidism, throat cancer, positive ANA, and positive rheumatoid factor -- per primary service    Freeman Caldron, PA-C Orthopedic Surgery [phone] 03/15/2023, 9:25 AM

## 2023-03-15 NOTE — Progress Notes (Signed)
PHARMACY - PHYSICIAN COMMUNICATION CRITICAL VALUE ALERT - BLOOD CULTURE IDENTIFICATION (BCID)  Dean Mayer is an 82 y.o. male who presented to [facility] on 03/14/2023 with a chief complaint of left knee pain and swelling.  Assessment: Blood culture 1/4 positive for staphylococcus aureus, no resistance detected. Likely from joint.  Name of physician (or Provider) Contacted: Lanae Boast, MD  Current antibiotics: cefazolin, vancomycin  Changes to prescribed antibiotics recommended:  Continue current cefazolin for coverage. ID team to follow.  Results for orders placed or performed during the hospital encounter of 03/14/23  Blood Culture ID Panel (Reflexed) (Collected: 03/14/2023  9:39 PM)  Result Value Ref Range   Enterococcus faecalis NOT DETECTED NOT DETECTED   Enterococcus Faecium NOT DETECTED NOT DETECTED   Listeria monocytogenes NOT DETECTED NOT DETECTED   Staphylococcus species DETECTED (A) NOT DETECTED   Staphylococcus aureus (BCID) DETECTED (A) NOT DETECTED   Staphylococcus epidermidis NOT DETECTED NOT DETECTED   Staphylococcus lugdunensis NOT DETECTED NOT DETECTED   Streptococcus species NOT DETECTED NOT DETECTED   Streptococcus agalactiae NOT DETECTED NOT DETECTED   Streptococcus pneumoniae NOT DETECTED NOT DETECTED   Streptococcus pyogenes NOT DETECTED NOT DETECTED   A.calcoaceticus-baumannii NOT DETECTED NOT DETECTED   Bacteroides fragilis NOT DETECTED NOT DETECTED   Enterobacterales NOT DETECTED NOT DETECTED   Enterobacter cloacae complex NOT DETECTED NOT DETECTED   Escherichia coli NOT DETECTED NOT DETECTED   Klebsiella aerogenes NOT DETECTED NOT DETECTED   Klebsiella oxytoca NOT DETECTED NOT DETECTED   Klebsiella pneumoniae NOT DETECTED NOT DETECTED   Proteus species NOT DETECTED NOT DETECTED   Salmonella species NOT DETECTED NOT DETECTED   Serratia marcescens NOT DETECTED NOT DETECTED   Haemophilus influenzae NOT DETECTED NOT DETECTED   Neisseria meningitidis  NOT DETECTED NOT DETECTED   Pseudomonas aeruginosa NOT DETECTED NOT DETECTED   Stenotrophomonas maltophilia NOT DETECTED NOT DETECTED   Candida albicans NOT DETECTED NOT DETECTED   Candida auris NOT DETECTED NOT DETECTED   Candida glabrata NOT DETECTED NOT DETECTED   Candida krusei NOT DETECTED NOT DETECTED   Candida parapsilosis NOT DETECTED NOT DETECTED   Candida tropicalis NOT DETECTED NOT DETECTED   Cryptococcus neoformans/gattii NOT DETECTED NOT DETECTED   Meth resistant mecA/C and MREJ NOT DETECTED NOT DETECTED   Romie Minus, PharmD PGY1 Pharmacy Resident  Please check AMION for all [facility] Pharmacy phone numbers After 10:00 PM, call Main Pharmacy [phone]

## 2023-03-15 NOTE — ED Notes (Signed)
ED TO INPATIENT HANDOFF REPORT  ED Nurse Name and Phone #: Pearletha Forge RN   S Name/Age/Gender Dean Mayer 82 y.o. male Room/Bed: TRACC/TRACC  Code Status   Code Status: Prior  Home/SNF/Other Home Patient oriented to: self, place, time, and situation Is this baseline? Yes   Triage Complete: Triage complete  Chief Complaint Bacterial infection of knee joint (HCC) [M00.869]  Triage Note Patient BIB [facility] c/o left knee pain.  Patient has been having problems with gout x 3 months and had fluid drawn off knee on Monday.  Patient reports subjective fever today, took tylenol at 1400 and had one episode of vomiting.   168/70 86 HR 16 RR 96% RA 160 CBG 100.7 temporal   Allergies No Known Allergies  Level of Care/Admitting Diagnosis ED Disposition     ED Disposition  Admit   Condition  --   Comment  Hospital Area: [facility] [[id number]]  Level of Care: Med-Surg [[id number]]  May admit patient to [facility] or [facility] if equivalent level of care is available:: No  Covid Evaluation: Asymptomatic - no recent exposure (last 10 days) testing not required  Diagnosis: Bacterial infection of knee joint [facility]) [[id number]]  Admitting Physician: Gery Pray [[id number]]  Attending Physician: Gery Pray [[id number]]  Certification:: I certify this patient will need inpatient services for at least 2 midnights  Estimated Length of Stay: 2          B Medical/Surgery History Past Medical History:  Diagnosis Date   Eye abnormalities    GERD (gastroesophageal reflux disease)    Hypertension    Kidney stones    Prostate cancer (HCC)    Throat cancer [facility])    Past Surgical History:  Procedure Laterality Date   CAROTID STENT     EYE SURGERY     TONSILLECTOMY       A IV Location/Drains/Wounds Patient Lines/Drains/Airways Status     Active Line/Drains/Airways     Name Placement date Placement time Site Days   Peripheral IV 03/14/23 20 G Right Antecubital 03/14/23   2207  Antecubital  1            Intake/Output Last 24 hours No intake or output data in the 24 hours ending 03/15/23 0032  Labs/Imaging Results for orders placed or performed during the hospital encounter of 03/14/23 (from the past 48 hour(s))  Sedimentation rate     Status: Abnormal   Collection Time: 03/14/23  9:55 PM  Result Value Ref Range   Sed Rate 97 (H) 0 - 16 mm/hr    Comment: Performed at [facility], [address], [place], [place] [address]  C-reactive protein     Status: Abnormal   Collection Time: 03/14/23  9:55 PM  Result Value Ref Range   CRP 14.1 (H) <1.0 mg/dL    Comment: Performed at [facility], [address] [place], [place] [address]  CBC     Status: Abnormal   Collection Time: 03/14/23  9:55 PM  Result Value Ref Range   WBC 9.5 4.0 - 10.5 K/uL   RBC 4.52 4.22 - 5.81 MIL/uL   Hemoglobin 12.7 (L) 13.0 - 17.0 g/dL   HCT 69.6 29.5 - 28.4 %   MCV 87.4 80.0 - 100.0 fL   MCH 28.1 26.0 - 34.0 pg   MCHC 32.2 30.0 - 36.0 g/dL   RDW 13.2 44.0 - 10.2 %   Platelets 369 150 - 400 K/uL   nRBC 0.0  0.0 - 0.2 %    Comment: Performed at [facility], [address], [place], [place] [address]  Comprehensive metabolic panel     Status: Abnormal   Collection Time: 03/14/23  9:55 PM  Result Value Ref Range   Sodium 137 135 - 145 mmol/L   Potassium 4.2 3.5 - 5.1 mmol/L   Chloride 100 98 - 111 mmol/L   CO2 27 22 - 32 mmol/L   Glucose, Bld 155 (H) 70 - 99 mg/dL    Comment: Glucose reference range applies only to samples taken after fasting for at least 8 hours.   BUN 23 8 - 23 mg/dL   Creatinine, Ser 1.91 0.61 - 1.24 mg/dL   Calcium 9.0 8.9 - 47.8 mg/dL   Total Protein 5.7 (L) 6.5 - 8.1 g/dL   Albumin 1.9 (L) 3.5 - 5.0 g/dL   AST 52 (H) 15 - 41 U/L   ALT 62 (H) 0 - 44 U/L   Alkaline Phosphatase 138 (H) 38 - 126 U/L   Total Bilirubin 0.5 0.3 - 1.2 mg/dL   GFR, Estimated >29 >56 mL/min    Comment: (NOTE) Calculated using the CKD-EPI  Creatinine Equation (2021)    Anion gap 10 5 - 15    Comment: Performed at [facility], [address] [place], [place] [address]  Uric acid     Status: None   Collection Time: 03/14/23  9:55 PM  Result Value Ref Range   Uric Acid, Serum 4.8 3.7 - 8.6 mg/dL    Comment: Performed at [facility], [address] [place], [place] [address]  Synovial cell count + diff, w/ crystals     Status: Abnormal   Collection Time: 03/14/23 10:35 PM  Result Value Ref Range   Color, Synovial RED (A) YELLOW   Appearance-Synovial TURBID (A) CLEAR   Crystals, Fluid INTRACELLULAR MONOSODIUM URATE CRYSTALS    WBC, Synovial 35,550 (H) 0 - 200 /cu mm   Neutrophil, Synovial 94 (H) 0 - 25 %   Lymphocytes-Synovial Fld 3 0 - 20 %   Monocyte-Macrophage-Synovial Fluid 3 (L) 50 - 90 %   Eosinophils-Synovial 0 0 - 1 %    Comment: Performed at [facility], [address], [place], [place] [address]   No results found.  Pending Labs Unresulted Labs (From admission, onward)     Start     Ordered   03/14/23 2140  Body fluid culture w Gram Stain  (Arthrocentesis Panel)  Once,   URGENT       Question:  Are there also cytology or pathology orders on this specimen?  Answer:  Yes   03/14/23 2140   03/14/23 2139  Blood culture (routine x 2)  BLOOD CULTURE X 2,   R (with STAT occurrences)      03/14/23 2138   03/14/23 2139  Body fluid culture w Gram Stain  Once,   URGENT       Question:  Are there also cytology or pathology orders on this specimen?  Answer:  Yes   03/14/23 2140            Vitals/Pain Today's Vitals   03/14/23 2130 03/14/23 2207 03/14/23 2330 03/14/23 2331  BP: 134/61     Pulse: 82  73   Resp: 18     Temp:   98.2 F (36.8 C)   TempSrc:      SpO2: 92%  94%   Weight:  Height:      PainSc:  10-Worst pain ever  10-Worst pain ever    Isolation Precautions No active isolations  Medications Medications  morphine (PF) 2 MG/ML injection 2 mg (has no administration  in time range)  ondansetron (ZOFRAN) injection 4 mg (has no administration in time range)  lidocaine (PF) (XYLOCAINE) 1 % injection 5 mL (5 mLs Other Given 03/14/23 2331)  ceFAZolin (ANCEF) IVPB 2g/100 mL premix (0 g Intravenous Stopped 03/15/23 0030)    Mobility walks     Focused Assessments Cardiac Assessment Handoff:    Lab Results  Component Value Date   TROPONINI <0.30 01/06/2012   No results found for: "DDIMER" Does the Patient currently have chest pain? No    R Recommendations: See Admitting Provider Note  Report given to:   Additional Notes: A & O x 4.

## 2023-03-15 NOTE — Progress Notes (Signed)
Admitted to 6n3 via stretcher from ED. Moved self onto bed.. Bed in low position, bed alarm on, call light within reach.

## 2023-03-15 NOTE — Op Note (Signed)
Orthopaedic Surgery Operative Note (CSN: [id number] ) Date of Surgery: 03/15/2023  Admit Date: 03/14/2023   Diagnoses: Pre-Op Diagnoses: Left septic knee  Post-Op Diagnosis: Same  Procedures: CPT 27310-Incision and drainage of left septic knee  Surgeons : Primary: Roby Lofts, MD  Assistant: Ulyses Southward, PA-C  Location: OR 3   Anesthesia: General   Antibiotics: Scheduled antibiotics and 1 gm vancomycin powder placed topically   Tourniquet time: None    Estimated Blood Loss: 20 mL  Complications:* No complications entered in OR log *   Specimens: ID Type Source Tests Collected by Time Destination  A : Left Knee Tissue Tissue Soft Tissue, Other FUNGUS CULTURE WITH STAIN, AEROBIC/ANAEROBIC CULTURE W GRAM STAIN (SURGICAL/DEEP WOUND) Roby Lofts, MD 03/15/2023 1248      Implants: * No implants in log *   Indications for Surgery: 82 year old male who developed a left septic knee.  Aspiration showed elevated white blood cell count with positive Gram stain and previous positive cultures.  Due to the persistent pain and recurrent infection I recommended proceeding with I&D of his left knee.  Risks and benefits were discussed with the patient and his wife.  Risk include but not limited to bleeding, infection, arthritic changes, nerve and blood vessel injury, DVT, and the possible anesthetic complications.  They agreed to proceed with surgery and consent was obtained.  Operative Findings: Incision and drainage of a left septic knee with debridement of suprapatellar pouch and placement of 1 g of vancomycin powder.  Procedure: The patient was identified in the preoperative holding area. Consent was confirmed with the patient and their family and all questions were answered. The operative extremity was marked after confirmation with the patient. he was then brought back to the operating room by our anesthesia colleagues.  He was placed under general anesthetic and carefully  transferred over to regular OR table.  The left lower extremity was then prepped and draped in usual sterile fashion.  A timeout was performed to verify the patient, the procedure, and the extremity.  Preoperative antibiotics were already given.  I made a 5 cm incision along the superomedial aspect of his patella.  I incised through the skin and subcutaneous tissue and then I incised through the capsule.  Here I encountered murky synovial fluid which I collected and sent for culture.  I then extended my arthrotomy proximally and distally to be able to appropriately access the knee joint as well as the suprapatellar pouch.  I then used a rongeur to debride the soft tissue.  I was able to remove all of the purulent material.  At this point I then irrigated the knee with approximately 3 L of normal saline using cystoscopy tubing.  I then changed my gloves placed a gram of vancomycin powder.  I closed the arthrotomy with a 0 PDS.  I placed a medium Hemovac drain prior to closing.  I then closed the skin with 3-0 nylon.  Sterile dressings were applied.  The patient was then awoke from anesthesia and taken to the PACU in stable condition.   Debridement type: Excisional Debridement  Side: left  Body Location: Knee  Tools used for debridement: scalpel and rongeur  Pre-debridement Wound size (cm):  N/A  Post-debridement Wound size (cm):   N/A  Debridement depth beyond dead/damaged tissue down to healthy viable tissue: yes  Tissue layer involved: skin, subcutaneous tissue, muscle / fascia  Nature of tissue removed: Purulence  Irrigation volume: 3L     Irrigation fluid  type: Normal Saline   Post Op Plan/Instructions: Patient will be weightbearing as tolerated to the left lower extremity.  He will receive antibiotics per hospitalist service as well as infectious disease.  He may have range of motion as tolerated.  His Hemovac drain may be removed postoperative day 1 or 2 depending on when he is  planning to discharge.  He may be placed on aspirin for DVT prophylaxis.  I was present and performed the entire surgery.  Ulyses Southward, PA-C did assist me throughout the case. An assistant was necessary given the difficulty in approach, maintenance of reduction and ability to instrument the fracture.   Truitt Merle, MD [facility]

## 2023-03-15 NOTE — Anesthesia Procedure Notes (Signed)
Procedure Name: LMA Insertion Date/Time: 03/15/2023 12:34 PM  Performed by: Audie Pinto, CRNAPre-anesthesia Checklist: Patient identified, Emergency Drugs available, Suction available and Patient being monitored Patient Re-evaluated:Patient Re-evaluated prior to induction Oxygen Delivery Method: Circle system utilized Preoxygenation: Pre-oxygenation with 100% oxygen Induction Type: IV induction LMA: LMA inserted LMA Size: 4.0 Placement Confirmation: positive ETCO2 Dental Injury: Teeth and Oropharynx as per pre-operative assessment  Comments: LMA placed by EMT student.

## 2023-03-15 NOTE — Transfer of Care (Signed)
Immediate Anesthesia Transfer of Care Note  Patient: Dean Mayer  Procedure(s) Performed: IRRIGATION AND DEBRIDEMENT LEFT KNEE (Left)  Patient Location: PACU  Anesthesia Type:General  Level of Consciousness: drowsy and patient cooperative  Airway & Oxygen Therapy: Patient Spontanous Breathing and Patient connected to face mask oxygen  Post-op Assessment: Report given to RN and Post -op Vital signs reviewed and stable  Post vital signs: Reviewed and stable  Last Vitals:  Vitals Value Taken Time  BP    Temp    Pulse    Resp    SpO2      Last Pain:  Vitals:   03/15/23 1130  TempSrc: Oral  PainSc:       Patients Stated Pain Goal: 2 (03/15/23 0612)  Complications: No notable events documented.

## 2023-03-15 NOTE — Discharge Instructions (Signed)
[facility] Discharge Instructions   General Discharge Instructions  WEIGHT BEARING STATUS:weightbearing as tolerated  RANGE OF MOTION/ACTIVITY: ok for knee motion as tolerated  Wound Care: You may remove your surgical dressing. Incisions can be left open to air if there is no drainage. Once the incision is completely dry and without drainage, it may be left open to air out.  Showering may begin post op day 3, (Monday 03/18/23).  Clean incision gently with soap and water.  DVT/PE prophylaxis: Aspirin  Diet: as you were eating previously.  Can use over the counter stool softeners and bowel preparations, such as Miralax, to help with bowel movements.  Narcotics can be constipating.  Be sure to drink plenty of fluids  PAIN MEDICATION USE AND EXPECTATIONS  You have likely been given narcotic medications to help control your pain.  After a traumatic event that results in an fracture (broken bone) with or without surgery, it is ok to use narcotic pain medications to help control one's pain.  We understand that everyone responds to pain differently and each individual patient will be evaluated on a regular basis for the continued need for narcotic medications. Ideally, narcotic medication use should last no more than 6-8 weeks (coinciding with fracture healing).   As a patient it is your responsibility as well to monitor narcotic medication use and report the amount and frequency you use these medications when you come to your office visit.   We would also advise that if you are using narcotic medications, you should take a dose prior to therapy to maximize you participation.  IF YOU ARE ON NARCOTIC MEDICATIONS IT IS NOT PERMISSIBLE TO OPERATE A MOTOR VEHICLE (MOTORCYCLE/CAR/TRUCK/MOPED) OR HEAVY MACHINERY DO NOT MIX NARCOTICS WITH OTHER CNS (CENTRAL NERVOUS SYSTEM) DEPRESSANTS SUCH AS ALCOHOL   STOP SMOKING OR USING NICOTINE PRODUCTS!!!!  As discussed nicotine severely impairs  your body's ability to heal surgical and traumatic wounds but also impairs bone healing.  Wounds and bone heal by forming microscopic blood vessels (angiogenesis) and nicotine is a vasoconstrictor (essentially, shrinks blood vessels).  Therefore, if vasoconstriction occurs to these microscopic blood vessels they essentially disappear and are unable to deliver necessary nutrients to the healing tissue.  This is one modifiable factor that you can do to dramatically increase your chances of healing your injury.    (This means no smoking, no nicotine gum, patches, etc)  ICE AND ELEVATE INJURED/OPERATIVE EXTREMITY  Using ice and elevating the injured extremity above your heart can help with swelling and pain control.  Icing in a pulsatile fashion, such as 20 minutes on and 20 minutes off, can be followed.    Do not place ice directly on skin. Make sure there is a barrier between to skin and the ice pack.    Using frozen items such as frozen peas works well as the conform nicely to the are that needs to be iced.  USE AN ACE WRAP OR TED HOSE FOR SWELLING CONTROL  In addition to icing and elevation, Ace wraps or TED hose are used to help limit and resolve swelling.  It is recommended to use Ace wraps or TED hose until you are informed to stop.    When using Ace Wraps start the wrapping distally (farthest away from the body) and wrap proximally (closer to the body)   Example: If you had surgery on your leg or thing and you do not have a splint on, start the ace wrap at the toes and  work your way up to the thigh        If you had surgery on your upper extremity and do not have a splint on, start the ace wrap at your fingers and work your way up to the upper arm   CALL THE OFFICE WITH ANY QUESTIONS OR CONCERNS: [phone]   VISIT OUR WEBSITE FOR ADDITIONAL INFORMATION: [email or web address]    Discharge Wound Care Instructions  Do NOT apply any ointments, solutions or lotions to pin sites or surgical  wounds.  These prevent needed drainage and even though solutions like hydrogen peroxide kill bacteria, they also damage cells lining the pin sites that help fight infection.  Applying lotions or ointments can keep the wounds moist and can cause them to breakdown and open up as well. This can increase the risk for infection. When in doubt call the office.  If any drainage is noted, use one layer of adaptic or Mepitel, then gauze, Kerlix, and an ace wrap. - These dressing supplies should be available at local medical supply stores [facility], [facility], etc) as well as Insurance claims handler ([facility], [facility], [facility], etc)  Once the incision is completely dry and without drainage, it may be left open to air out.  Showering may begin 36-48 hours later.  Cleaning gently with soap and water.  Traumatic wounds should be dressed daily as well.    One layer of adaptic, gauze, Kerlix, then ace wrap.  The adaptic can be discontinued once the draining has ceased

## 2023-03-15 NOTE — Plan of Care (Signed)
?  Problem: Education: ?Goal: Knowledge of General Education information will improve ?Description: Including pain rating scale, medication(s)/side effects and non-pharmacologic comfort measures ?Outcome: Progressing ?  ?Problem: Nutrition: ?Goal: Adequate nutrition will be maintained ?Outcome: Progressing ?  ?Problem: Coping: ?Goal: Level of anxiety will decrease ?Outcome: Progressing ?  ?Problem: Elimination: ?Goal: Will not experience complications related to bowel motility ?Outcome: Progressing ?Goal: Will not experience complications related to urinary retention ?Outcome: Progressing ?  ?

## 2023-03-15 NOTE — TOC Initial Note (Signed)
Transition of Care [facility]) - Initial/Assessment Note    Patient Details  Name: Dean Mayer MRN: [id number] Date of Birth: [birth year]  Transition of Care [place] [facility]) CM/SW Contact:    Durenda Guthrie, RN Phone Number: 03/15/2023, 10:17 AM  Clinical Narrative:                 Patient is 82 yr old male from home with wife. Has Bacterial infection of left knee, plan is for I&D of left knee today. TOC Team will continue to follow for needs.         Patient Goals and CMS Choice            Expected Discharge Plan and Services                                              Prior Living Arrangements/Services                       Activities of Daily Living      Permission Sought/Granted                  Emotional Assessment              Admission diagnosis:  Bacterial infection of knee joint (HCC) [M00.869] Patient Active Problem List   Diagnosis Date Noted   Bacterial infection of knee joint (HCC) 03/15/2023   Swelling of right foot 02/20/2023   Sedimentation rate elevation 02/20/2023   Positive ANA (antinuclear antibody) 02/20/2023   Peripheral arterial disease (HCC) 12/04/2022   Cellulitis of right lower extremity 12/03/2022   Peripheral edema 12/02/2022   Cellulitis of right leg 12/01/2022   Hypertension 12/01/2022   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   Dyspnea on exertion 12/01/2022   Hypothyroidism 12/01/2022   Carotid stenosis 08/29/2021   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   Malignant neoplasm of prostate (HCC) 05/28/2018   Primary open angle glaucoma (POAG) of right eye, severe stage 09/16/2017   Lacunar infarct, acute (HCC) 01/05/2016   Confusion 01/04/2016   Hypertensive urgency 01/04/2016   Left-sided weakness 01/04/2016   Stroke (HCC) 01/04/2016   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   Basal cell carcinoma of left eyelid 05/09/2015   Carcinoma  of left eyelid 04/25/2015   Acne rosacea 07/26/2014   Eyelid lesion 07/26/2014   Primary open angle glaucoma of both eyes 02/23/2013   Visual field defect 02/23/2013   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 [place] [address] Phone: [phone] Fax: [phone]     Social Determinants of Health (SDOH) Social History: SDOH Screenings   Food Insecurity: No Food Insecurity (12/01/2022)  Housing: Low Risk  (12/01/2022)  Transportation Needs: No Transportation Needs (12/01/2022)  Utilities: Not At Risk (12/01/2022)  Financial Resource Strain: Low Risk  (11/19/2021)   Received from [facility]  Physical Activity: Inactive (11/19/2021)  Received from [facility], [facility]  Social Connections: Unknown (11/26/2022)   Received from [facility], [facility]  Stress: No Stress Concern Present (03/16/2022)   Received from [facility], [facility]  Tobacco Use: Medium Risk (03/14/2023)   SDOH Interventions:     Readmission Risk Interventions     No data to display

## 2023-03-15 NOTE — ED Notes (Signed)
ED TO INPATIENT HANDOFF REPORT  ED Nurse Name and Phone #: Minerva Areola 42  S Name/Age/Gender Dean Mayer 82 y.o. male Room/Bed: 006C/006C  Code Status   Code Status: DNR  Home/SNF/Other Home Patient oriented to: self, place, time, and situation Is this baseline? Yes   Triage Complete: Triage complete  Chief Complaint Bacterial infection of knee joint (HCC) [M00.869]  Triage Note Patient BIB [facility] c/o left knee pain.  Patient has been having problems with gout x 3 months and had fluid drawn off knee on Monday.  Patient reports subjective fever today, took tylenol at 1400 and had one episode of vomiting.   168/70 86 HR 16 RR 96% RA 160 CBG 100.7 temporal   Allergies No Known Allergies  Level of Care/Admitting Diagnosis ED Disposition     ED Disposition  Admit   Condition  --   Comment  Hospital Area: [facility] [[id number]]  Level of Care: Med-Surg [16]  May admit patient to [facility] or [facility] if equivalent level of care is available:: No  Covid Evaluation: Asymptomatic - no recent exposure (last 10 days) testing not required  Diagnosis: Bacterial infection of knee joint [facility]) [[id number]]  Admitting Physician: Gery Pray [[id number]]  Attending Physician: Gery Pray [[id number]]  Certification:: I certify this patient will need inpatient services for at least 2 midnights  Estimated Length of Stay: 2          B Medical/Surgery History Past Medical History:  Diagnosis Date   Eye abnormalities    GERD (gastroesophageal reflux disease)    Hypertension    Kidney stones    Prostate cancer (HCC)    Throat cancer [facility])    Past Surgical History:  Procedure Laterality Date   CAROTID STENT     EYE SURGERY     TONSILLECTOMY       A IV Location/Drains/Wounds Patient Lines/Drains/Airways Status     Active Line/Drains/Airways     Name Placement date Placement time Site Days   Peripheral IV 03/14/23 20 G Right Antecubital 03/14/23  2207   Antecubital  1   Peripheral IV 03/15/23 20 G 1.88" Anterior;Right Forearm 03/15/23  0316  Forearm  less than 1            Intake/Output Last 24 hours No intake or output data in the 24 hours ending 03/15/23 0500  Labs/Imaging Results for orders placed or performed during the hospital encounter of 03/14/23 (from the past 48 hour(s))  Body fluid culture w Gram Stain     Status: None (Preliminary result)   Collection Time: 03/14/23  9:39 PM   Specimen: Joint, Knee; Body Fluid  Result Value Ref Range   Specimen Description JOINT FLUID KNEE    Special Requests NONE    Gram Stain      MODERATE WBC PRESENT,BOTH PMN AND MONONUCLEAR RARE GRAM POSITIVE COCCI Performed at [facility], [address], [place], [place] [address]    Culture PENDING    Report Status PENDING   Sedimentation rate     Status: Abnormal   Collection Time: 03/14/23  9:55 PM  Result Value Ref Range   Sed Rate 97 (H) 0 - 16 mm/hr    Comment: Performed at [facility], [address], [place], [place] [address]  C-reactive protein     Status: Abnormal   Collection Time: 03/14/23  9:55 PM  Result Value Ref Range   CRP 14.1 (H) <1.0 mg/dL    Comment:  Performed at [facility], [address] [place], [place] [address]  CBC     Status: Abnormal   Collection Time: 03/14/23  9:55 PM  Result Value Ref Range   WBC 9.5 4.0 - 10.5 K/uL   RBC 4.52 4.22 - 5.81 MIL/uL   Hemoglobin 12.7 (L) 13.0 - 17.0 g/dL   HCT 19.1 47.8 - 29.5 %   MCV 87.4 80.0 - 100.0 fL   MCH 28.1 26.0 - 34.0 pg   MCHC 32.2 30.0 - 36.0 g/dL   RDW 62.1 30.8 - 65.7 %   Platelets 369 150 - 400 K/uL   nRBC 0.0 0.0 - 0.2 %    Comment: Performed at [facility], [address] [place], [place] [address]  Comprehensive metabolic panel     Status: Abnormal   Collection Time: 03/14/23  9:55 PM  Result Value Ref Range   Sodium 137 135 - 145 mmol/L   Potassium 4.2 3.5 - 5.1 mmol/L   Chloride 100 98 - 111 mmol/L   CO2 27 22 -  32 mmol/L   Glucose, Bld 155 (H) 70 - 99 mg/dL    Comment: Glucose reference range applies only to samples taken after fasting for at least 8 hours.   BUN 23 8 - 23 mg/dL   Creatinine, Ser 2.95 0.61 - 1.24 mg/dL   Calcium 9.0 8.9 - 28.4 mg/dL   Total Protein 5.7 (L) 6.5 - 8.1 g/dL   Albumin 1.9 (L) 3.5 - 5.0 g/dL   AST 52 (H) 15 - 41 U/L   ALT 62 (H) 0 - 44 U/L   Alkaline Phosphatase 138 (H) 38 - 126 U/L   Total Bilirubin 0.5 0.3 - 1.2 mg/dL   GFR, Estimated >13 >24 mL/min    Comment: (NOTE) Calculated using the CKD-EPI Creatinine Equation (2021)    Anion gap 10 5 - 15    Comment: Performed at [facility], [address] [place], [place] [address]  Uric acid     Status: None   Collection Time: 03/14/23  9:55 PM  Result Value Ref Range   Uric Acid, Serum 4.8 3.7 - 8.6 mg/dL    Comment: Performed at [facility], [address] [place], [place] [address]  Synovial cell count + diff, w/ crystals     Status: Abnormal   Collection Time: 03/14/23 10:35 PM  Result Value Ref Range   Color, Synovial RED (A) YELLOW   Appearance-Synovial TURBID (A) CLEAR   Crystals, Fluid INTRACELLULAR MONOSODIUM URATE CRYSTALS    WBC, Synovial 35,550 (H) 0 - 200 /cu mm   Neutrophil, Synovial 94 (H) 0 - 25 %   Lymphocytes-Synovial Fld 3 0 - 20 %   Monocyte-Macrophage-Synovial Fluid 3 (L) 50 - 90 %   Eosinophils-Synovial 0 0 - 1 %    Comment: Performed at [facility], [address] [place], [place] [address]  Comprehensive metabolic panel     Status: Abnormal   Collection Time: 03/15/23  3:02 AM  Result Value Ref Range   Sodium 134 (L) 135 - 145 mmol/L   Potassium 3.8 3.5 - 5.1 mmol/L   Chloride 98 98 - 111 mmol/L   CO2 25 22 - 32 mmol/L   Glucose, Bld 141 (H) 70 - 99 mg/dL    Comment: Glucose reference range applies only to samples taken after fasting for at least 8 hours.   BUN 21 8 - 23 mg/dL  Creatinine, Ser 0.77 0.61 - 1.24 mg/dL   Calcium 8.4 (L) 8.9 - 10.3 mg/dL    Total Protein 5.8 (L) 6.5 - 8.1 g/dL   Albumin 1.9 (L) 3.5 - 5.0 g/dL   AST 38 15 - 41 U/L   ALT 53 (H) 0 - 44 U/L   Alkaline Phosphatase 120 38 - 126 U/L   Total Bilirubin 0.7 0.3 - 1.2 mg/dL   GFR, Estimated >00 >93 mL/min    Comment: (NOTE) Calculated using the CKD-EPI Creatinine Equation (2021)    Anion gap 11 5 - 15    Comment: Performed at [facility], [address], [place], [place] [address]  Protime-INR     Status: None   Collection Time: 03/15/23  3:02 AM  Result Value Ref Range   Prothrombin Time 13.9 11.4 - 15.2 seconds   INR 1.1 0.8 - 1.2    Comment: (NOTE) INR goal varies based on device and disease states. Performed at [facility], [address], [place], [place] [address]   C-reactive protein     Status: Abnormal   Collection Time: 03/15/23  3:02 AM  Result Value Ref Range   CRP 13.2 (H) <1.0 mg/dL    Comment: Performed at [facility], [address] [place], [place] [address]  CBC with Differential/Platelet     Status: Abnormal   Collection Time: 03/15/23  3:59 AM  Result Value Ref Range   WBC 8.6 4.0 - 10.5 K/uL   RBC 4.28 4.22 - 5.81 MIL/uL   Hemoglobin 12.2 (L) 13.0 - 17.0 g/dL    Comment: REPEATED TO VERIFY RECOLLECT. PREVIOUS SAMPLE QUESTIONABLE RESULTS.    HCT 37.3 (L) 39.0 - 52.0 %   MCV 87.1 80.0 - 100.0 fL   MCH 28.5 26.0 - 34.0 pg   MCHC 32.7 30.0 - 36.0 g/dL   RDW 38.1 01.7 - 51.0 %   Platelets 357 150 - 400 K/uL   nRBC 0.0 0.0 - 0.2 %   Neutrophils Relative % 83 %   Neutro Abs 7.1 1.7 - 7.7 K/uL   Lymphocytes Relative 9 %   Lymphs Abs 0.8 0.7 - 4.0 K/uL   Monocytes Relative 6 %   Monocytes Absolute 0.5 0.1 - 1.0 K/uL   Eosinophils Relative 1 %   Eosinophils Absolute 0.1 0.0 - 0.5 K/uL   Basophils Relative 0 %   Basophils Absolute 0.0 0.0 - 0.1 K/uL   Immature Granulocytes 1 %   Abs Immature Granulocytes 0.06 0.00 - 0.07 K/uL    Comment: Performed at [facility], [address] [place], [place]  [address]   No results found.  Pending Labs Unresulted Labs (From admission, onward)     Start     Ordered   03/22/23 0500  Creatinine, serum  (enoxaparin (LOVENOX)    CrCl >/= 30 ml/min)  Weekly,   R     Comments: while on enoxaparin therapy    03/15/23 0130   03/15/23 0500  CBC with Differential/Platelet  Tomorrow morning,   R        03/15/23 0130   03/15/23 0405  Sedimentation rate  Once,   STAT        03/15/23 0405   03/14/23 2140  Body fluid culture w Gram Stain  (Arthrocentesis Panel)  Once,   URGENT       Question:  Are there also cytology or pathology orders on this specimen?  Answer:  Yes  03/14/23 2140   03/14/23 2139  Blood culture (routine x 2)  BLOOD CULTURE X 2,   R (with STAT occurrences)      03/14/23 2138            Vitals/Pain Today's Vitals   03/14/23 2331 03/15/23 0145 03/15/23 0210 03/15/23 0300  BP:   (!) 140/65 (!) 151/59  Pulse:    69  Resp:    20  Temp:    98.2 F (36.8 C)  TempSrc:    Oral  SpO2:    94%  Weight:      Height:      PainSc: 10-Worst pain ever 6   8     Isolation Precautions No active isolations  Medications Medications  morphine (PF) 2 MG/ML injection 2 mg (2 mg Intravenous Given 03/15/23 0459)  ondansetron (ZOFRAN) injection 4 mg (has no administration in time range)  amLODipine (NORVASC) tablet 10 mg (has no administration in time range)  aspirin EC tablet 325 mg (has no administration in time range)  levothyroxine (SYNTHROID) tablet 100 mcg (has no administration in time range)  tamsulosin (FLOMAX) capsule 0.4 mg (has no administration in time range)  allopurinol (ZYLOPRIM) tablet 100 mg (has no administration in time range)  lisinopril (ZESTRIL) tablet 5 mg (5 mg Oral Given 03/15/23 0210)  atorvastatin (LIPITOR) tablet 40 mg (has no administration in time range)  brimonidine (ALPHAGAN) 0.2 % ophthalmic solution 1 drop (1 drop Right Eye Given 03/15/23 0216)  enoxaparin (LOVENOX) injection 40 mg (has no administration in time  range)  acetaminophen (TYLENOL) tablet 650 mg (650 mg Oral Given 03/15/23 0241)    Or  acetaminophen (TYLENOL) suppository 650 mg ( Rectal See Alternative 03/15/23 0241)  senna-docusate (Senokot-S) tablet 1 tablet (has no administration in time range)  0.9 %  sodium chloride infusion ( Intravenous New Bag/Given 03/15/23 0210)  ceFAZolin (ANCEF) IVPB 2g/100 mL premix (has no administration in time range)  lidocaine (PF) (XYLOCAINE) 1 % injection 5 mL (5 mLs Other Given 03/14/23 2331)  ceFAZolin (ANCEF) IVPB 2g/100 mL premix (0 g Intravenous Stopped 03/15/23 0030)  colchicine tablet 1.2 mg (1.2 mg Oral Given 03/15/23 0230)    Followed by  colchicine tablet 0.6 mg (0.6 mg Oral Given 03/15/23 0317)    Mobility walks with device     Focused Assessments Cardiac Assessment Handoff:    Lab Results  Component Value Date   TROPONINI <0.30 01/06/2012   No results found for: "DDIMER" Does the Patient currently have chest pain? No    R Recommendations: See Admitting Provider Note  Report given to:   Additional Notes: pt cooperative, pain meds given 0500, 2 iv's, afebrile

## 2023-03-15 NOTE — Anesthesia Preprocedure Evaluation (Addendum)
Anesthesia Evaluation  Patient identified by MRN, date of birth, ID band Patient awake    Reviewed: Allergy & Precautions, NPO status , Patient's Chart, lab work & pertinent test results  History of Anesthesia Complications Negative for: history of anesthetic complications  Airway Mallampati: III  TM Distance: >3 FB Neck ROM: Full    Dental  (+) Dental Advisory Given On the top, patient only has 3 teeth. Denies any loose teeth.:   Pulmonary neg shortness of breath, neg sleep apnea, neg COPD, neg recent URI, former smoker   Pulmonary exam normal breath sounds clear to auscultation       Cardiovascular hypertension (amlodipine, lisinopril), Pt. on medications (-) angina + Peripheral Vascular Disease  (-) Past MI, (-) Cardiac Stents and (-) CABG (-) dysrhythmias  Rhythm:Regular Rate:Normal  HLD, carotid stenosis  TTE 12/02/2022: IMPRESSIONS    1. Left ventricular ejection fraction, by estimation, is 70 to 75%. The  left ventricle has hyperdynamic function. The left ventricle has no  regional wall motion abnormalities. Left ventricular diastolic parameters  were normal.   2. Right ventricular systolic function is normal. The right ventricular  size is normal. Tricuspid regurgitation signal is inadequate for assessing  PA pressure.   3. The mitral valve is normal in structure. No evidence of mitral valve  regurgitation. No evidence of mitral stenosis.   4. The aortic valve is normal in structure. Aortic valve regurgitation is  not visualized. No aortic stenosis is present.   5. The inferior vena cava is normal in size with greater than 50%  respiratory variability, suggesting right atrial pressure of 3 mmHg.     Neuro/Psych  PSYCHIATRIC DISORDERS      CVA (2017, right-sided weakness), Residual Symptoms    GI/Hepatic Neg liver ROS,GERD  ,,  Endo/Other  neg diabetesHypothyroidism    Renal/GU Renal disease (h/o stones)   H/o  Prostate cancer    Musculoskeletal  (+) Arthritis ,    Abdominal   Peds  Hematology negative hematology ROS (+)   Anesthesia Other Findings 82yom w/ history of gout, HTN, HLD, BPH, hypothyroidism, throat cancer, positive ANA, positive rheumatoid factor who on 7/29 saw rheumatologist with complaints of left knee pain and swelling. At that point patient's knee was tapped and sent for synovial fluid analysis.  Patient is given intra-articular steroid joint infection.  Fluid analysis shows gout, he was started on allopurinol.  On 7/31 patient recontacted rheumatologist as his pain and swelling persisted.  He was prescribed a 9-day prednisone taper. Per patient a temporal improvement then his pain worsened.  H/o tongue cancer  Reproductive/Obstetrics                             Anesthesia Physical Anesthesia Plan  ASA: 3  Anesthesia Plan: General   Post-op Pain Management: Tylenol PO (pre-op)*   Induction: Intravenous  PONV Risk Score and Plan: 2 and Ondansetron, Dexamethasone and Treatment may vary due to age or medical condition  Airway Management Planned: LMA  Additional Equipment:   Intra-op Plan:   Post-operative Plan: Extubation in OR  Informed Consent: I have reviewed the patients History and Physical, chart, labs and discussed the procedure including the risks, benefits and alternatives for the proposed anesthesia with the patient or authorized representative who has indicated his/her understanding and acceptance.   Patient has DNR.  Discussed DNR with patient and Suspend DNR.   Dental advisory given  Plan Discussed with: Anesthesiologist and  CRNA  Anesthesia Plan Comments: (Risks of general anesthesia discussed including, but not limited to, sore throat, hoarse voice, chipped/damaged teeth, injury to vocal cords, nausea and vomiting, allergic reactions, lung infection, heart attack, stroke, and death. All questions answered. )         Anesthesia Quick Evaluation

## 2023-03-15 NOTE — Hospital Course (Signed)
82yom w/ history of gout, HTN, HLD, BPH, hypothyroidism, throat cancer, positive ANA, positive rheumatoid factor who on 7/29 saw rheumatologist with complaints of left knee pain and swelling. At that point patient's knee was tapped and sent for synovial fluid analysis.  Patient is given intra-articular steroid joint infection.  Fluid analysis shows gout, he was started on allopurinol.  On 7/31 patient recontacted rheumatologist as his pain and swelling persisted.  He was prescribed a 9-day prednisone taper. Per patient a temporal improvement then his pain worsened.  He was not getting around with a walker.  He had difficulty getting in bed, out of bed, around the house and seen  in ED 03/11/2023>repeat arthrocentesis done AND discharged with hydrocodone, was given orthopedics referral but returned to ED 8/30 tonight  as he developed nausea and vomiting, fever Tmax 100.7 and knee remained painful. 7/29 joint aspiration>75,000 WBCs, cultures grew pansensitive MSSA and was positive for monosodium urate.  one 03/11/2023 synovial fluid>26,000 WBCs rare gram-positive cocci, no crystals noted. ESR 14.1, CRP 97 In the ER,repeat tap of his left knee done. He was afebrile.Left knee remains painful with much decreased range of motion.  Orthopedics on-call Dr. Susa Simmonds consulted and  admitted for MSSA left knee septic arthritis 8/9: Incision and drainage of left septic knee Dr Jena Gauss Given history of throat cancer and dysphagia felt that it would not be safe to proceed with TEE so planning for 6 weeks of IV antibiotics and close follow-up in the clinic.

## 2023-03-15 NOTE — Progress Notes (Signed)
PHARMACY CONSULT NOTE FOR:  OUTPATIENT  PARENTERAL ANTIBIOTIC THERAPY (OPAT)  Indication: MSSA L-septic knee Regimen: Cefazolin 2g IV every 8 hours End date: 04/28/23 (6 weeks from neg BCx 8/11)  IV antibiotic discharge orders are pended. To discharging provider:  please sign these orders via discharge navigator,  Select New Orders & click on the button choice - Manage This Unsigned Work.     Thank you for allowing pharmacy to be a part of this patient's care.  Georgina Pillion, PharmD, BCPS, BCIDP Infectious Diseases Clinical Pharmacist 03/20/2023 8:44 AM   **Pharmacist phone directory can now be found on [email or web address] ([email or web address]).  Listed under [facility].

## 2023-03-15 NOTE — Interval H&P Note (Signed)
History and Physical Interval Note:  03/15/2023 11:27 AM  Dean Mayer  has presented today for surgery, with the diagnosis of SEPTIC JOINT.  The various methods of treatment have been discussed with the patient and family. After consideration of risks, benefits and other options for treatment, the patient has consented to  Procedure(s): IRRIGATION AND DEBRIDEMENT LEFT KNEE (Left) as a surgical intervention.  The patient's history has been reviewed, patient examined, no change in status, stable for surgery.  I have reviewed the patient's chart and labs.  Questions were answered to the patient's satisfaction.     Caryn Bee P 

## 2023-03-15 NOTE — Consult Note (Signed)
[facility]  Total days of antibiotics 2  Reason for Consult: MSSA bacteremia and septic MSSA left knee    Referring Physician: [email or web address]  Principal Problem:   Bacterial infection of knee joint (HCC)    HPI: Dean Mayer is a 82 y.o. male hx of HTN, HLD, BPH, CAD s/p PCI, + ANA/RF who has had poor health in the last 5 months. He initially had episode of right ankle gouty flare vs. Cellulitis in April requiring hospital admission. He would have occasional flares of ankle pain thus referred to rheumatology for evaluation. He started to have left knee pain for the last 3 weeks. He was seen by dr Nena Jordan on 7/29 for evaluation of that pain. He underwent joint arthrocentesis, cell count showed - 75K, 93%N, but ? If crystals reported. He underwent intra-articular steroid injection plus allopurinol. Due to symptoms not improving. He was start oral pred taper on 7/31. The patient reported worsening red knee swelling and unable to weightbear. In addition, he started to have fevers and worsening drenching nightsweats for which brought him back to the ED on 03/11/2023.  In the ER he had a repeat arthrocentesis done, cell count of 2.65K, 89%N  He was discharged with hydrocodone.  He was given orthopedics referral.  Today he developed nausea and vomiting, fever Tmax 100.7.  Patient took a Tylenol, came to the ER.  Patient's knee remained painful. Cultures from 8/5 of synovial fluid showing MSSA. ESR 14.1, CRP 97. Due to + cultures and worsening symptoms, patient was seen back in the ED on 8/8 for admission to manage septic arthritis of left knee. Due to fevers, he underwent infectious work up and his blood cx are also showing MSSA. He was taken to the OR on 8/9 for IXD, murky fluid immediately found within the joint capsule.for which was sent for culture. Repeat synovial fluid sent yesterday that now showed +intracellular urate crystals. 35K, 94%N turbid in consistency.  Wife reports he  has had worsening back pain in addition to left knee issues. Has had drenching nightsweats for the past 3-4 weeks.  No trauma that caused initial onset of left knee pain  Past Medical History:  Diagnosis Date   Eye abnormalities    GERD (gastroesophageal reflux disease)    Hypertension    Kidney stones    Prostate cancer (HCC)    Throat cancer (HCC)     Allergies: No Known Allergies   MEDICATIONS:  allopurinol  100 mg Oral Daily   amLODipine  10 mg Oral Daily   aspirin EC  325 mg Oral Daily   atorvastatin  40 mg Oral QPM   brimonidine  1 drop Right Eye BID   docusate sodium  100 mg Oral BID   levothyroxine  100 mcg Oral Daily   lisinopril  5 mg Oral BID   tamsulosin  0.4 mg Oral Daily    Social History   Tobacco Use   Smoking status: Former    Current packs/day: 20.00    Average packs/day: 13.6 packs/day for 59.6 years (812.1 ttl pk-yrs)    Types: Cigarettes    Start date: 1965    Passive exposure: Past   Smokeless tobacco: Never  Vaping Use   Vaping status: Never Used  Substance Use Topics   Alcohol use: No   Drug use: No    Family History  Problem Relation Age of Onset   Stroke Mother    Heart attack Mother  Heart disease Brother     Review of Systems -  12 point ros reviewed, see hpi. Otherwise 12 point ros is negative  OBJECTIVE: Temp:  [97.7 F (36.5 C)-98.7 F (37.1 C)] 98.2 F (36.8 C) (08/09 1400) Pulse Rate:  [69-83] 81 (08/09 1355) Resp:  [11-20] 19 (08/09 1355) BP: (134-180)/(59-66) 169/63 (08/09 1345) SpO2:  [92 %-99 %] 95 % (08/09 1355) Weight:  [213 kg] 108 kg (08/09 1130) Physical Exam  Constitutional: He is oriented to person, place, and time. He appears well-developed and well-nourished. No distress.  HENT:  Mouth/Throat: Oropharynx is clear and moist. No oropharyngeal exudate.  Cardiovascular: Normal rate, regular rhythm and normal heart sounds. Exam reveals no gallop and no friction rub. Soft murmur Pulmonary/Chest: Effort  normal and breath sounds normal. No respiratory distress. He has no wheezes.  Abdominal: Soft. Bowel sounds are normal. He exhibits no distension. There is no tenderness.  Ext: left knee wrapped from surgery Neurological: He is alert and oriented to person, place, and time.  Skin: Skin is warm and dry. No rash noted. No erythema.  Psychiatric: He has a normal mood and affect. His behavior is normal.   LABS: Results for orders placed or performed during the hospital encounter of 03/14/23 (from the past 48 hour(s))  Blood culture (routine x 2)     Status: None (Preliminary result)   Collection Time: 03/14/23  9:39 PM   Specimen: BLOOD  Result Value Ref Range   Specimen Description BLOOD RIGHT ANTECUBITAL    Special Requests      BOTTLES DRAWN AEROBIC AND ANAEROBIC Blood Culture results may not be optimal due to an excessive volume of blood received in culture bottles   Culture  Setup Time      GRAM POSITIVE COCCI IN CLUSTERS ANAEROBIC BOTTLE ONLY Organism ID to follow Performed at [facility] Lab, [address] [place], [place] [address]    Culture GRAM POSITIVE COCCI    Report Status PENDING   Body fluid culture w Gram Stain     Status: None (Preliminary result)   Collection Time: 03/14/23  9:39 PM   Specimen: Joint, Knee; Body Fluid  Result Value Ref Range   Specimen Description JOINT FLUID KNEE    Special Requests NONE    Gram Stain      MODERATE WBC PRESENT,BOTH PMN AND MONONUCLEAR RARE GRAM POSITIVE COCCI    Culture      CULTURE REINCUBATED FOR BETTER GROWTH Performed at [facility] Lab, [address], [place], [place] [address]    Report Status PENDING   Sedimentation rate     Status: Abnormal   Collection Time: 03/14/23  9:55 PM  Result Value Ref Range   Sed Rate 97 (H) 0 - 16 mm/hr    Comment: Performed at [facility], [address] [place], [place] [address]  C-reactive protein     Status: Abnormal   Collection Time: 03/14/23  9:55 PM  Result  Value Ref Range   CRP 14.1 (H) <1.0 mg/dL    Comment: Performed at [facility] Lab, [address] [place], [place] [address]  CBC     Status: Abnormal   Collection Time: 03/14/23  9:55 PM  Result Value Ref Range   WBC 9.5 4.0 - 10.5 K/uL   RBC 4.52 4.22 - 5.81 MIL/uL   Hemoglobin 12.7 (L) 13.0 - 17.0 g/dL   HCT 40.1 02.7 - 25.3 %   MCV 87.4 80.0 - 100.0  fL   MCH 28.1 26.0 - 34.0 pg   MCHC 32.2 30.0 - 36.0 g/dL   RDW 16.1 09.6 - 04.5 %   Platelets 369 150 - 400 K/uL   nRBC 0.0 0.0 - 0.2 %    Comment: Performed at [facility] Lab, [address], [place], [place] [address]  Comprehensive metabolic panel     Status: Abnormal   Collection Time: 03/14/23  9:55 PM  Result Value Ref Range   Sodium 137 135 - 145 mmol/L   Potassium 4.2 3.5 - 5.1 mmol/L   Chloride 100 98 - 111 mmol/L   CO2 27 22 - 32 mmol/L   Glucose, Bld 155 (H) 70 - 99 mg/dL    Comment: Glucose reference range applies only to samples taken after fasting for at least 8 hours.   BUN 23 8 - 23 mg/dL   Creatinine, Ser 1.91 0.61 - 1.24 mg/dL   Calcium 9.0 8.9 - 47.8 mg/dL   Total Protein 5.7 (L) 6.5 - 8.1 g/dL   Albumin 1.9 (L) 3.5 - 5.0 g/dL   AST 52 (H) 15 - 41 U/L   ALT 62 (H) 0 - 44 U/L   Alkaline Phosphatase 138 (H) 38 - 126 U/L   Total Bilirubin 0.5 0.3 - 1.2 mg/dL   GFR, Estimated >29 >56 mL/min    Comment: (NOTE) Calculated using the CKD-EPI Creatinine Equation (2021)    Anion gap 10 5 - 15    Comment: Performed at [facility] Lab, [address], [place], [place] [address]  Uric acid     Status: None   Collection Time: 03/14/23  9:55 PM  Result Value Ref Range   Uric Acid, Serum 4.8 3.7 - 8.6 mg/dL    Comment: Performed at [facility] Lab, [address], [place], [place] [address]  Synovial cell count + diff, w/ crystals     Status: Abnormal   Collection Time: 03/14/23 10:35 PM  Result Value Ref Range   Color, Synovial RED (A) YELLOW   Appearance-Synovial TURBID (A) CLEAR   Crystals,  Fluid INTRACELLULAR MONOSODIUM URATE CRYSTALS    WBC, Synovial 35,550 (H) 0 - 200 /cu mm   Neutrophil, Synovial 94 (H) 0 - 25 %   Lymphocytes-Synovial Fld 3 0 - 20 %   Monocyte-Macrophage-Synovial Fluid 3 (L) 50 - 90 %   Eosinophils-Synovial 0 0 - 1 %    Comment: Performed at [facility] Lab, [address], [place], [place] [address]  Comprehensive metabolic panel     Status: Abnormal   Collection Time: 03/15/23  3:02 AM  Result Value Ref Range   Sodium 134 (L) 135 - 145 mmol/L   Potassium 3.8 3.5 - 5.1 mmol/L   Chloride 98 98 - 111 mmol/L   CO2 25 22 - 32 mmol/L   Glucose, Bld 141 (H) 70 - 99 mg/dL    Comment: Glucose reference range applies only to samples taken after fasting for at least 8 hours.   BUN 21 8 - 23 mg/dL   Creatinine, Ser 5.28 0.61 - 1.24 mg/dL   Calcium 8.4 (L) 8.9 - 10.3 mg/dL   Total Protein 5.8 (L) 6.5 - 8.1 g/dL   Albumin 1.9 (L) 3.5 - 5.0 g/dL   AST 38 15 - 41 U/L   ALT 53 (H) 0 - 44 U/L   Alkaline Phosphatase 120 38 - 126 U/L   Total Bilirubin 0.7 0.3 - 1.2 mg/dL   GFR, Estimated >  60 >60 mL/min    Comment: (NOTE) Calculated using the CKD-EPI Creatinine Equation (2021)    Anion gap 11 5 - 15    Comment: Performed at [facility] Lab, [address] [place], [place] [address]  Protime-INR     Status: None   Collection Time: 03/15/23  3:02 AM  Result Value Ref Range   Prothrombin Time 13.9 11.4 - 15.2 seconds   INR 1.1 0.8 - 1.2    Comment: (NOTE) INR goal varies based on device and disease states. Performed at [facility] Lab, [address], [place], [place] [address]   C-reactive protein     Status: Abnormal   Collection Time: 03/15/23  3:02 AM  Result Value Ref Range   CRP 13.2 (H) <1.0 mg/dL    Comment: Performed at [facility] Lab, [address], [place], [place] [address]  CBC with Differential/Platelet     Status: Abnormal   Collection Time: 03/15/23  3:59 AM  Result Value Ref Range   WBC 8.6 4.0 - 10.5 K/uL   RBC 4.28 4.22  - 5.81 MIL/uL   Hemoglobin 12.2 (L) 13.0 - 17.0 g/dL    Comment: REPEATED TO VERIFY RECOLLECT. PREVIOUS SAMPLE QUESTIONABLE RESULTS.    HCT 37.3 (L) 39.0 - 52.0 %   MCV 87.1 80.0 - 100.0 fL   MCH 28.5 26.0 - 34.0 pg   MCHC 32.7 30.0 - 36.0 g/dL   RDW 44.0 10.2 - 72.5 %   Platelets 357 150 - 400 K/uL   nRBC 0.0 0.0 - 0.2 %   Neutrophils Relative % 83 %   Neutro Abs 7.1 1.7 - 7.7 K/uL   Lymphocytes Relative 9 %   Lymphs Abs 0.8 0.7 - 4.0 K/uL   Monocytes Relative 6 %   Monocytes Absolute 0.5 0.1 - 1.0 K/uL   Eosinophils Relative 1 %   Eosinophils Absolute 0.1 0.0 - 0.5 K/uL   Basophils Relative 0 %   Basophils Absolute 0.0 0.0 - 0.1 K/uL   Immature Granulocytes 1 %   Abs Immature Granulocytes 0.06 0.00 - 0.07 K/uL    Comment: Performed at [facility] Lab, [address], [place], [place] [address]  Sedimentation rate     Status: Abnormal   Collection Time: 03/15/23  3:59 AM  Result Value Ref Range   Sed Rate 70 (H) 0 - 16 mm/hr    Comment: Performed at [facility] Lab, [address], [place], [place] [address]    MICRO: Specimen Description SYNOVIAL  Special Requests LEFT KNEE  Gram Stain MODERATE WBC PRESENT, PREDOMINANTLY PMN NO ORGANISMS SEEN Performed at [facility] Lab, [address], [place], [place] [address]  Culture FEW STAPHYLOCOCCUS AUREUS  Report Status 03/14/2023 FINAL  Organism ID, Bacteria STAPHYLOCOCCUS AUREUS  Resulting Agency [facility]     Susceptibility   Staphylococcus aureus    MIC    CIPROFLOXACIN 1 SENSITIVE Sensitive    CLINDAMYCIN <=0.25 SENS... Sensitive    ERYTHROMYCIN <=0.25 SENS... Sensitive    GENTAMICIN <=0.5 SENSI... Sensitive    Inducible Clindamycin NEGATIVE Sensitive    LINEZOLID 2 SENSITIVE Sensitive    OXACILLIN <=0.25 SENS... Sensitive    RIFAMPIN <=0.5 SENSI... Sensitive    TETRACYCLINE <=1 SENSITIVE Sensitive    TRIMETH/SULFA <=10 SENSIT... Sensitive    VANCOMYCIN <=0.5 SENSI... Sensitive          Assessment/Plan:  82yo M with HTN, CAD, hx of gout, admitted for 3-4 wk  history of left knee pain and swelling suspected to get gouty flare but now found to have MSSA septic arthritis with secondary MSSA bacteremia. Due to length of symptoms concern that he may have other nidus of infection  - start with TTE but would like to get TEE as well for evaluation of endocarditis - will repeat blood cx tomorrow to see if bacteremia is clearing - will change iv abtx to cefazolin 2gm IV Q 8hr - recommend to get MRI of thoracic-lumbar spine to exclude spinal infectious disease -will need a minimum of 4 wk of IV abtx from day of clearance of bacteremia but possibly longer depending on findings from work up.  Concominant gouty flare = try to avoid steroids initially for treatment, but defer to primary team for management  Murmur on exam = recommend TTE   B. Drue Second MD MPH [facility] [phone]

## 2023-03-15 NOTE — Anesthesia Postprocedure Evaluation (Signed)
Anesthesia Post Note  Patient: Dean Mayer  Procedure(s) Performed: IRRIGATION AND DEBRIDEMENT LEFT KNEE (Left)     Patient location during evaluation: PACU Anesthesia Type: General Level of consciousness: awake Pain management: pain level controlled Vital Signs Assessment: post-procedure vital signs reviewed and stable Respiratory status: spontaneous breathing, nonlabored ventilation and respiratory function stable Cardiovascular status: blood pressure returned to baseline and stable Postop Assessment: no apparent nausea or vomiting Anesthetic complications: no   No notable events documented.  Last Vitals:  Vitals:   03/15/23 1330 03/15/23 1350  BP: (!) 154/65   Pulse: 83 78  Resp: 13 11  Temp: 37.1 C   SpO2: 93% 95%    Last Pain:  Vitals:   03/15/23 1330  TempSrc:   PainSc: Asleep                 Linton Rump

## 2023-03-15 NOTE — Progress Notes (Signed)
Patient seen and examined personally, I reviewed the chart, history and physical and admission note, done by admitting physician this morning and agree with the same with following addendum.  Please refer to the morning admission note for more detailed plan of care.  Briefly,  82yom w/ history of gout, HTN, HLD, BPH, hypothyroidism, throat cancer, positive ANA, positive rheumatoid factor who on 7/29 saw rheumatologist with complaints of left knee pain and swelling. At that point patient's knee was tapped and sent for synovial fluid analysis.  Patient is given intra-articular steroid joint infection.  Fluid analysis shows gout, he was started on allopurinol.  On 7/31 patient recontacted rheumatologist as his pain and swelling persisted.  He was prescribed a 9-day prednisone taper. Per patient a temporal improvement then his pain worsened.  He was not getting around with a walker.  He had difficulty getting in bed, out of bed, around the house and seen  in ED 03/11/2023>repeat arthrocentesis done AND discharged with hydrocodone, was given orthopedics referral but returned to ED 8/30 tonight  as he developed nausea and vomiting, fever Tmax 100.7 and knee remained painful. 7/29 joint aspiration>75,000 WBCs, cultures grew pansensitive MSSA and was positive for monosodium urate.  one 03/11/2023 synovial fluid>26,000 WBCs rare gram-positive cocci, no crystals noted. ESR 14.1, CRP 97 In the ER,repeat tap of his left knee done. He was afebrile.Left knee remains painful with much decreased range of motion.  Orthopedics on-call Dr. Susa Simmonds consulted and  admitted for MSSA left knee septic arthritis   Seen and examined Overnight afebrile BP 150s, not hypoxic Labs stable CBC and CMP. ESR 73 down from 97, Lt knee fluid analysis> 8/9 WBC 3550, fluid and blood cultures in process  Complains of ongoing left knee pain not relieved with current morphine  A/P Left knee joint infection with MSSA/septic arthritis: Continue  current antibiotics with Ancef.  Orthopedic has been consulted and noted plan for I&D if OR available. Continue n.p.o. for now, continue Motrin Tylenol for pain control, IV fluid hydration.  Pain unrelieved with morphine will change to Dilaudid 0.5 mg for severe pain  Acute gout arthropathy: Continue allopurinol, s/p colchicine . cont pain control. ?Steroid if pain not improved on colchicine pending infectious workup if okay with orthopedics.  Elevated LFTs W/ Hypoalbuminemia, new: trend? Sepsis related  Hyperglycemia: check HbA1c.  HTN: PTA on lisinopril, Norvasc resumed  HLD: on atorvastatin   BPH: cont Flomax    Hypothyroidism Cont synthroid   History of throat cancer   Cataracts Cont eye drops

## 2023-03-16 ENCOUNTER — Encounter (HOSPITAL_COMMUNITY): Payer: Self-pay | Admitting: Student

## 2023-03-16 ENCOUNTER — Inpatient Hospital Stay (HOSPITAL_COMMUNITY): Payer: Medicare HMO

## 2023-03-16 DIAGNOSIS — M00869 Arthritis due to other bacteria, unspecified knee: Secondary | ICD-10-CM | POA: Diagnosis not present

## 2023-03-16 LAB — HEMOGLOBIN A1C
Hgb A1c MFr Bld: 5.9 % — ABNORMAL HIGH (ref 4.8–5.6)
Mean Plasma Glucose: 122.63 mg/dL

## 2023-03-16 MED ORDER — FLUTICASONE PROPIONATE 50 MCG/ACT NA SUSP
2.0000 | Freq: Every day | NASAL | Status: DC | PRN
  Filled 2023-03-16: qty 16

## 2023-03-16 MED ORDER — OMEGA-3-ACID ETHYL ESTERS 1 G PO CAPS
1.0000 g | ORAL_CAPSULE | Freq: Every day | ORAL | Status: DC
  Administered 2023-03-16 – 2023-03-20 (×5): 1 g via ORAL
  Filled 2023-03-16 (×5): qty 1

## 2023-03-16 MED ORDER — VITAMIN C 500 MG PO TABS
1000.0000 mg | ORAL_TABLET | Freq: Every day | ORAL | Status: DC
  Administered 2023-03-16 – 2023-03-20 (×5): 1000 mg via ORAL
  Filled 2023-03-16 (×5): qty 2

## 2023-03-16 MED ORDER — FERROUS SULFATE 325 (65 FE) MG PO TABS
ORAL_TABLET | Freq: Every day | ORAL | Status: DC
  Administered 2023-03-16 – 2023-03-20 (×5): 325 mg via ORAL
  Filled 2023-03-16 (×5): qty 1

## 2023-03-16 NOTE — Evaluation (Signed)
Occupational Therapy Evaluation Patient Details Name: Dean Mayer MRN: [id number] DOB: [birth year] Today's Date: 03/16/2023   History of Present Illness 82 y.o. male presenting with 2 week hx of L knee pain, found to have a septic L knee. He is now s/p I&D on 03/14/23. PMH of  GERD, HTN, Kidney stones, Throat cancer.   Clinical Impression   Pt admitted for above, was very independent and active in his garden PTA. Pt currently presenting with L knee pain  which impacts his balance. He needs Min A for bed mobility and STS but CGA when ambulating and cues for larger gross LE movement. Pt needs Mod to max A for LB ADLs at this time and would benefit from AE education if not able to progress to doing so without. Pt would benefit from continued acute skilled OT services to address deficits and help transition to next level of care. No follow-up recommending at this time pending patient progression.       If plan is discharge home, recommend the following: A little help with walking and/or transfers;Assistance with cooking/housework;Assist for transportation;A lot of help with bathing/dressing/bathroom    Functional Status Assessment  Patient has had a recent decline in their functional status and demonstrates the ability to make significant improvements in function in a reasonable and predictable amount of time.  Equipment Recommendations  BSC/3in1;Tub/shower seat    Recommendations for Other Services       Precautions / Restrictions Precautions Precautions: Fall Restrictions Weight Bearing Restrictions: Yes LLE Weight Bearing: Weight bearing as tolerated      Mobility Bed Mobility Overal bed mobility: Needs Assistance Bed Mobility: Supine to Sit     Supine to sit: Min assist     General bed mobility comments: Moves slowly to compensate for pain, pt left sitting in recliner    Transfers Overall transfer level: Needs assistance Equipment used: Rolling walker (2  wheels) Transfers: Sit to/from Stand Sit to Stand: From elevated surface, Min assist                  Balance Overall balance assessment: Needs assistance Sitting-balance support: Feet unsupported, No upper extremity supported Sitting balance-Leahy Scale: Good     Standing balance support: During functional activity, Reliant on assistive device for balance, Bilateral upper extremity supported Standing balance-Leahy Scale: Poor                             ADL either performed or assessed with clinical judgement   ADL Overall ADL's : Needs assistance/impaired Eating/Feeding: Independent;Sitting   Grooming: Set up;Supervision/safety;Sitting   Upper Body Bathing: Modified independent;Sitting   Lower Body Bathing: Moderate assistance;Sitting/lateral leans   Upper Body Dressing : Modified independent;Sitting   Lower Body Dressing: Sit to/from stand;Maximal assistance   Toilet Transfer: Contact guard assist;Rolling walker (2 wheels);Stand-pivot;Ambulation   Toileting- Clothing Manipulation and Hygiene: Minimal assistance;Sitting/lateral lean       Functional mobility during ADLs: Contact guard assist;Rolling walker (2 wheels) General ADL Comments: Reinforced AROM on L knee     Vision         Perception         Praxis         Pertinent Vitals/Pain Pain Assessment Pain Assessment: 0-10 Pain Score: 6  Pain Location: L knee Pain Descriptors / Indicators: Aching, Discomfort Pain Intervention(s): Limited activity within patient's tolerance, Repositioned, Monitored during session     Extremity/Trunk Assessment Upper Extremity Assessment Upper Extremity  Assessment: Overall WFL for tasks assessed   Lower Extremity Assessment Lower Extremity Assessment: Defer to PT evaluation       Communication     Cognition Arousal: Alert Behavior During Therapy: WFL for tasks assessed/performed Overall Cognitive Status: Within Functional Limits for tasks  assessed                                 General Comments: very pleasant older man     General Comments  VSS on RA    Exercises     Shoulder Instructions      Home Living Family/patient expects to be discharged to:: Private residence Living Arrangements: Spouse/significant other Available Help at Discharge: Family;Available 24 hours/day Type of Home: House Home Access: Stairs to enter [employer] of Steps: 4 (mainly uses car port) Entrance Stairs-Rails: Right Home Layout: Two level;Able to live on main level with bedroom/bathroom     Bathroom Shower/Tub: Producer, television/film/video: Standard Bathroom Accessibility: Yes How Accessible: Accessible via walker Home Equipment: Rolling Walker (2 wheels)   Additional Comments: Likes to garden      Prior Functioning/Environment Prior Level of Function : Independent/Modified Independent;Driving             Mobility Comments: uses RW, 2 weeks ago was not using anything ADLs Comments: "very independent"        OT Problem List: Impaired balance (sitting and/or standing);Decreased activity tolerance      OT Treatment/Interventions: Self-care/ADL training;Balance training;Therapeutic exercise;Therapeutic activities;Patient/family education    OT Goals(Current goals can be found in the care plan section) Acute Rehab OT Goals Patient Stated Goal: To walk better OT Goal Formulation: With patient Time For Goal Achievement: 03/30/23 Potential to Achieve Goals: Good ADL Goals Pt Will Perform Grooming: with supervision;standing Pt Will Perform Lower Body Bathing: sitting/lateral leans;with supervision Pt Will Perform Lower Body Dressing: with supervision;sitting/lateral leans;with adaptive equipment Pt Will Transfer to Toilet: with supervision;ambulating  OT Frequency: Min 1X/week    Co-evaluation              AM-PAC OT "6 Clicks" Daily Activity     Outcome Measure Help from another  person eating meals?: None Help from another person taking care of personal grooming?: A Little Help from another person toileting, which includes using toliet, bedpan, or urinal?: A Little Help from another person bathing (including washing, rinsing, drying)?: A Lot Help from another person to put on and taking off regular upper body clothing?: None Help from another person to put on and taking off regular lower body clothing?: A Lot 6 Click Score: 18   End of Session Equipment Utilized During Treatment: Gait belt;Rolling walker (2 wheels) Nurse Communication: Mobility status  Activity Tolerance: Patient tolerated treatment well Patient left: in chair;with call bell/phone within reach;with chair alarm set  OT Visit Diagnosis: Unsteadiness on feet (R26.81);Other abnormalities of gait and mobility (R26.89);Pain Pain - Right/Left: Left Pain - part of body: Knee                Time: 8413-2440 OT Time Calculation (min): 28 min Charges:  OT General Charges $OT Visit: 1 Visit OT Evaluation $OT Eval Moderate Complexity: 1 Mod OT Treatments $Therapeutic Activity: 8-22 mins  03/16/2023  AB, OTR/L  Acute Rehabilitation Services  Office: [phone]   Tristan Schroeder 03/16/2023, 4:44 PM

## 2023-03-16 NOTE — Evaluation (Signed)
Physical Therapy Evaluation Patient Details Name: Dean Mayer MRN: [id number] DOB: [birth year] Today's Date: 03/16/2023  History of Present Illness  82 y.o. male presenting with 2 week hx of L knee pain, found to have a septic L knee. He is now s/p I&D on 03/14/23 (day of admission). PMH of HTN, Throat and prostate CA.  Clinical Impression  Pt is POD #1 and is mobilizing short distances from bed to recliner with min assist and RW.  Post operative exercises given and reviewed.  Pt is hopeful to progress well enough to d/c home with his wife who he reports can provide physical assist.   PT to follow acutely for deficits listed below.         If plan is discharge home, recommend the following: A little help with walking and/or transfers;A little help with bathing/dressing/bathroom;Assist for transportation;Help with stairs or ramp for entrance;Assistance with cooking/housework   Can travel by private vehicle        Equipment Recommendations None recommended by PT  Recommendations for Other Services       Functional Status Assessment Patient has had a recent decline in their functional status and demonstrates the ability to make significant improvements in function in a reasonable and predictable amount of time.     Precautions / Restrictions Precautions Precautions: Fall Restrictions LLE Weight Bearing: Weight bearing as tolerated      Mobility  Bed Mobility Overal bed mobility: Needs Assistance Bed Mobility: Sit to Supine       Sit to supine: Min assist   General bed mobility comments: Min assist to help lift left leg into bed    Transfers Overall transfer level: Needs assistance Equipment used: Rolling walker (2 wheels) Transfers: Sit to/from Stand, Bed to chair/wheelchair/BSC Sit to Stand: Min assist   Step pivot transfers: Min assist       General transfer comment: Min assist to stand from low chair to RW and take 5 pivotal steps to the bed.  Pt limited by  pain, but hesitatnt to take pain medication.    Ambulation/Gait                  Stairs            Wheelchair Mobility     Tilt Bed    Modified Rankin (Stroke Patients Only)       Balance Overall balance assessment: Needs assistance Sitting-balance support: Feet supported, No upper extremity supported Sitting balance-Leahy Scale: Good     Standing balance support: Bilateral upper extremity supported Standing balance-Leahy Scale: Poor Standing balance comment: reliant on UE support on RW due to L knee pain                             Pertinent Vitals/Pain Pain Assessment Pain Assessment: Faces Faces Pain Scale: Hurts even more Pain Location: L knee Pain Descriptors / Indicators: Aching, Discomfort Pain Intervention(s): Limited activity within patient's tolerance, Monitored during session, Repositioned    Home Living Family/patient expects to be discharged to:: Private residence Living Arrangements: Spouse/significant other Available Help at Discharge: Family;Available 24 hours/day Type of Home: House Home Access: Stairs to enter Entrance Stairs-Rails: Right Entrance Stairs-Number of Steps: 4   Home Layout: Two level;Able to live on main level with bedroom/bathroom Home Equipment: Rolling Walker (2 wheels) Additional Comments: Likes to garden    Prior Function Prior Level of Function : Independent/Modified Independent;Driving  Mobility Comments: uses RW, 2 weeks ago was not using anything ADLs Comments: "very independent"     Extremity/Trunk Assessment   Upper Extremity Assessment Upper Extremity Assessment: Defer to OT evaluation    Lower Extremity Assessment Lower Extremity Assessment: LLE deficits/detail LLE Deficits / Details: normal post op pain and weakness, ankle 3/5, knee 2/5, hip 2/5, hemovac drain intact. LLE: Unable to fully assess due to pain    Cervical / Trunk Assessment Cervical / Trunk  Assessment: Normal  Communication      Cognition Arousal: Alert Behavior During Therapy: WFL for tasks assessed/performed Overall Cognitive Status: Within Functional Limits for tasks assessed                                          General Comments      Exercises Total Joint Exercises Ankle Circles/Pumps: AROM, Both, 20 reps Quad Sets: AROM, Left, 10 reps Heel Slides: AAROM, Left, 10 reps Hip ABduction/ADduction: AAROM, Left, 10 reps Straight Leg Raises: AAROM, Left, 10 reps   Assessment/Plan    PT Assessment Patient needs continued PT services  PT Problem List Decreased strength;Decreased range of motion;Decreased activity tolerance;Decreased balance;Decreased mobility;Decreased knowledge of use of DME;Pain       PT Treatment Interventions DME instruction;Gait training;Stair training;Functional mobility training;Therapeutic activities;Therapeutic exercise;Balance training;Patient/family education;Modalities    PT Goals (Current goals can be found in the Care Plan section)  Acute Rehab PT Goals Patient Stated Goal: to get back to walking without the RW PT Goal Formulation: With patient Time For Goal Achievement: 03/30/23 Potential to Achieve Goals: Good    Frequency Min 1X/week     Co-evaluation               AM-PAC PT "6 Clicks" Mobility  Outcome Measure Help needed turning from your back to your side while in a flat bed without using bedrails?: A Little Help needed moving from lying on your back to sitting on the side of a flat bed without using bedrails?: A Little Help needed moving to and from a bed to a chair (including a wheelchair)?: A Little Help needed standing up from a chair using your arms (e.g., wheelchair or bedside chair)?: A Little Help needed to walk in hospital room?: Total Help needed climbing 3-5 steps with a railing? : Total 6 Click Score: 14    End of Session Equipment Utilized During Treatment: Gait belt Activity  Tolerance: Patient limited by pain Patient left: in bed;with bed alarm set;with call bell/phone within reach Nurse Communication: Mobility status PT Visit Diagnosis: Muscle weakness (generalized) (M62.81);Difficulty in walking, not elsewhere classified (R26.2);Pain Pain - Right/Left: Left Pain - part of body: Knee    Time: 1710-1740 PT Time Calculation (min) (ACUTE ONLY): 30 min   Charges:   PT Evaluation $PT Eval Moderate Complexity: 1 Mod PT Treatments $Therapeutic Exercise: 8-22 mins PT General Charges $$ ACUTE PT VISIT: 1 Visit        Corinna Capra, PT, DPT  Acute Rehabilitation Secure chat is best for contact #[phone] office

## 2023-03-16 NOTE — Progress Notes (Signed)
PROGRESS NOTE Dean Mayer  ZOX:[id number] DOB: March 31, 1941 DOA: 03/14/2023 PCP: Angelique Blonder, MD  Brief Narrative/Hospital Course: 82yom w/ history of gout, HTN, HLD, BPH, hypothyroidism, throat cancer, positive ANA, positive rheumatoid factor who on 7/29 saw rheumatologist with complaints of left knee pain and swelling. At that point patient's knee was tapped and sent for synovial fluid analysis.  Patient is given intra-articular steroid joint infection.  Fluid analysis shows gout, he was started on allopurinol.  On 7/31 patient recontacted rheumatologist as his pain and swelling persisted.  He was prescribed a 9-day prednisone taper. Per patient a temporal improvement then his pain worsened.  He was not getting around with a walker.  He had difficulty getting in bed, out of bed, around the house and seen  in ED 03/11/2023>repeat arthrocentesis done AND discharged with hydrocodone, was given orthopedics referral but returned to ED 8/30 tonight  as he developed nausea and vomiting, fever Tmax 100.7 and knee remained painful. 7/29 joint aspiration>75,000 WBCs, cultures grew pansensitive MSSA and was positive for monosodium urate.  one 03/11/2023 synovial fluid>26,000 WBCs rare gram-positive cocci, no crystals noted. ESR 14.1, CRP 97 In the ER,repeat tap of his left knee done. He was afebrile.Left knee remains painful with much decreased range of motion.  Orthopedics on-call Dr. Susa Simmonds consulted and  admitted for MSSA left knee septic arthritis 8/9: Incision and drainage of left septic knee Dr Jena Gauss    Subjective: Patient seen and examined this morning. His wife at the bedside.  Reports his pain is much better today Overnight afebrile BP stable Patient has been needing Norco for pain control along with IV Dilaudid prn Lab/eleven 1.6 mild hyponatremia 132 mild anemia 9.4  Assessment and Plan: Principal Problem:   Bacterial infection of knee joint (HCC)  MSSA sepsis MSSA septic arthritis left  knee Left knee pain x 3 to 4 weeks Initially suspected left acute flare up: 8/9: Incision and drainage of left septic knee Dr Jena Gauss.  Synovial fluid 8/5- MSSA. And from OR  8/9> shows gram-positive cocci in his stain-culture pending. Blood culture 8/8 with [facility] in cluster-BC ID shows MSSA.  Repeat blood culture sent 8/9. Appreciate ID inputs on board> need to eval other nidus of infection, follow-up TTE but likely need TEE.  Continue IV cefazolin 2 g every 8 hours-will need at least 4 weeks of antibiotics from day of clearance of bacteremia possibly longer.ID planning MRI of thoracic lumbar spine to exclude any spinal infections  ?Acute gout flareup: continue allopurinol, s/p colchicine. cont pain control.  Try to avoid steroids initially given infection process  Elevated LFTs W/ Hypoalbuminemia, new: trend? Sepsis related  Hyperglycemia: Fu hba1c  HTN: BP controlled, continue home amlodipine 10 and lisinopril 5.    HLD: Cont atorvastatin  Normocytic anemia mild: monitor  BPH: Cont Flomax    Hypothyroidism Cont synthroid   History of throat cancer   Cataracts Cont eye drops  DVT prophylaxis: SCDs Start: 03/15/23 1439 asa 325 mg daily Code Status:   Code Status: DNR Family Communication: plan of care discussed with patient at bedside. Patient status is: Inpatient because of septic knee Level of care: Med-Surg   Dispo: The patient is from: home            Anticipated disposition: Pending clinical improvement Objective: Vitals last 24 hrs: Vitals:   03/15/23 2102 03/16/23 0020 03/16/23 0404 03/16/23 0733  BP: (!) 145/69 133/68 (!) 135/59 (!) 157/59  Pulse: 80 65 (!) 59 63  Resp: 17  17 19 18   Temp: 98.3 F (36.8 C) 98.1 F (36.7 C) 97.9 F (36.6 C) 97.6 F (36.4 C)  TempSrc: Oral  Oral Oral  SpO2: 96% 95% 95% 95%  Weight:      Height:       Weight change: 0 kg  Physical Examination: General exam: alert awake, older than stated age HEENT:Oral mucosa moist,  Ear/Nose WNL grossly Respiratory system: bilaterally clear BS, no use of accessory muscle Cardiovascular system: S1 & S2 +, No JVD. Gastrointestinal system: Abdomen soft,NT,ND, BS+ Nervous System:Alert, awake, moving extremities. Extremities: LE edema neg, left knee with dressing in place with wound VAC distal peripheral pulses palpable.  Skin: No rashes,no icterus. MSK: Normal muscle bulk,tone, power  Medications reviewed:  Scheduled Meds:  allopurinol  100 mg Oral Daily   amLODipine  10 mg Oral Daily   ascorbic acid  1,000 mg Oral Daily   aspirin EC  325 mg Oral Daily   atorvastatin  40 mg Oral QPM   brimonidine  1 drop Right Eye BID   docusate sodium  100 mg Oral BID   ferrous sulfate   Oral Daily   levothyroxine  100 mcg Oral Daily   lisinopril  5 mg Oral BID   omega-3 acid ethyl esters  1 g Oral Daily   tamsulosin  0.4 mg Oral Daily  Continuous Infusions:  sodium chloride 75 mL/hr at 03/16/23 0624    ceFAZolin (ANCEF) IV 2 g (03/16/23 1310)   methocarbamol (ROBAXIN) IV      Diet Order             Diet Heart Room service appropriate? Yes; Fluid consistency: Thin  Diet effective now                  Intake/Output Summary (Last 24 hours) at 03/16/2023 1504 Last data filed at 03/16/2023 1884 Gross per 24 hour  Intake --  Output 800 ml  Net -800 ml   Net IO Since Admission: -1,020 mL [03/16/23 1504]  Wt Readings from Last 3 Encounters:  03/15/23 108 kg  03/11/23 108 kg  02/20/23 108 kg     Unresulted Labs (From admission, onward)     Start     Ordered   03/22/23 0500  Creatinine, serum  (enoxaparin (LOVENOX)    CrCl >/= 30 ml/min)  Weekly,   R     Comments: while on enoxaparin therapy    03/15/23 0130   03/16/23 0500  CBC  Daily,   R      03/15/23 1049   03/16/23 0500  Comprehensive metabolic panel  Daily,   R      03/15/23 1049   03/15/23 1248  Fungus Culture With Stain  RELEASE UPON ORDERING,   TIMED       Comments: Specimen A: Pre-op  diagnosis: SEPTIC JOINT    03/15/23 1248   03/15/23 0500  CBC with Differential/Platelet  Tomorrow morning,   R        03/15/23 0130   Signed and Held  CBC  Daily,   R      Signed and Held   Signed and Held  Basic metabolic panel  Tomorrow morning,   R        Signed and Held          Data Reviewed: I have personally reviewed following labs and imaging studies CBC: Recent Labs  Lab 03/11/23 0843 03/14/23 2155 03/15/23 0359 03/16/23 0112  WBC 10.5 9.5 8.6 9.6  NEUTROABS 9.1*  --  7.1  --   HGB 12.5* 12.7* 12.2* 11.5*  HCT 38.6* 39.5 37.3* 34.8*  MCV 89.8 87.4 87.1 87.7  PLT 339 369 357 335   Basic Metabolic Panel: Recent Labs  Lab 03/11/23 0843 03/14/23 2155 03/15/23 0302 03/16/23 0112  NA 138 137 134* 132*  K 4.1 4.2 3.8 4.1  CL 98 100 98 98  CO2 27 27 25 23   GLUCOSE 139* 155* 141* 175*  BUN 27* 23 21 18   CREATININE 0.87 0.86 0.77 0.90  CALCIUM 9.1 9.0 8.4* 8.3*   GFR: Estimated Creatinine Clearance: 88.2 mL/min (by C-G formula based on SCr of 0.9 mg/dL). Liver Function Tests: Recent Labs  Lab 03/14/23 2155 03/15/23 0302 03/16/23 0112  AST 52* 38 22  ALT 62* 53* 31  ALKPHOS 138* 120 105  BILITOT 0.5 0.7 0.2*  PROT 5.7* 5.8* 5.2*  ALBUMIN 1.9* 1.9* 1.6*   Recent Labs  Lab 03/15/23 0302  INR 1.1   Recent Results (from the past 240 hour(s))  Body fluid culture w Gram Stain     Status: None   Collection Time: 03/11/23  8:39 AM   Specimen: Synovium; Body Fluid  Result Value Ref Range Status   Specimen Description SYNOVIAL  Final   Special Requests LEFT KNEE  Final   Gram Stain   Final    MODERATE WBC PRESENT, PREDOMINANTLY PMN NO ORGANISMS SEEN Performed at [facility], [address], [place], [place] [address]    Culture FEW STAPHYLOCOCCUS AUREUS  Final   Report Status 03/14/2023 FINAL  Final   Organism ID, Bacteria STAPHYLOCOCCUS AUREUS  Final      Susceptibility   Staphylococcus aureus - MIC*    CIPROFLOXACIN 1 SENSITIVE  Sensitive     ERYTHROMYCIN <=0.25 SENSITIVE Sensitive     GENTAMICIN <=0.5 SENSITIVE Sensitive     OXACILLIN <=0.25 SENSITIVE Sensitive     TETRACYCLINE <=1 SENSITIVE Sensitive     VANCOMYCIN <=0.5 SENSITIVE Sensitive     TRIMETH/SULFA <=10 SENSITIVE Sensitive     CLINDAMYCIN <=0.25 SENSITIVE Sensitive     RIFAMPIN <=0.5 SENSITIVE Sensitive     Inducible Clindamycin NEGATIVE Sensitive     LINEZOLID 2 SENSITIVE Sensitive     * FEW STAPHYLOCOCCUS AUREUS  Blood culture (routine x 2)     Status: Abnormal (Preliminary result)   Collection Time: 03/14/23  9:39 PM   Specimen: BLOOD  Result Value Ref Range Status   Specimen Description BLOOD RIGHT ANTECUBITAL  Final   Special Requests   Final    BOTTLES DRAWN AEROBIC AND ANAEROBIC Blood Culture results may not be optimal due to an excessive volume of blood received in culture bottles   Culture  Setup Time   Final    GRAM POSITIVE COCCI IN CLUSTERS IN BOTH AEROBIC AND ANAEROBIC BOTTLES CRITICAL RESULT CALLED TO, READ BACK BY AND VERIFIED WITH: PHARMD [id number] 1502 TO SAVANNAH MOODY, ADC    Culture (A)  Final    STAPHYLOCOCCUS AUREUS SUSCEPTIBILITIES TO FOLLOW Performed at [facility], [address], [place], [place] [address]    Report Status PENDING  Incomplete  Body fluid culture w Gram Stain     Status: None (Preliminary result)   Collection Time: 03/14/23  9:39 PM   Specimen: Joint, Knee; Body Fluid  Result Value Ref Range Status   Specimen Description JOINT FLUID KNEE  Final   Special Requests NONE  Final   Gram Stain   Final  MODERATE WBC PRESENT,BOTH PMN AND MONONUCLEAR RARE GRAM POSITIVE COCCI    Culture   Final    RARE STAPHYLOCOCCUS AUREUS SUSCEPTIBILITIES TO FOLLOW Performed at [facility], [address], [place], [place] [address]    Report Status PENDING  Incomplete  Blood Culture ID Panel (Reflexed)     Status: Abnormal   Collection Time: 03/14/23  9:39 PM  Result Value Ref Range Status    Enterococcus faecalis NOT DETECTED NOT DETECTED Final   Enterococcus Faecium NOT DETECTED NOT DETECTED Final   Listeria monocytogenes NOT DETECTED NOT DETECTED Final   Staphylococcus species DETECTED (A) NOT DETECTED Final    Comment: CRITICAL RESULT CALLED TO, READ BACK BY AND VERIFIED WITH: PHARMD SAVANNAH MOODY [id number] 1502, ADC    Staphylococcus aureus (BCID) DETECTED (A) NOT DETECTED Final    Comment: CRITICAL RESULT CALLED TO, READ BACK BY AND VERIFIED WITH: PHARMD SAVANNAH MOODY [id number] 1502, ADC    Staphylococcus epidermidis NOT DETECTED NOT DETECTED Final   Staphylococcus lugdunensis NOT DETECTED NOT DETECTED Final   Streptococcus species NOT DETECTED NOT DETECTED Final   Streptococcus agalactiae NOT DETECTED NOT DETECTED Final   Streptococcus pneumoniae NOT DETECTED NOT DETECTED Final   Streptococcus pyogenes NOT DETECTED NOT DETECTED Final   A.calcoaceticus-baumannii NOT DETECTED NOT DETECTED Final   Bacteroides fragilis NOT DETECTED NOT DETECTED Final   Enterobacterales NOT DETECTED NOT DETECTED Final   Enterobacter cloacae complex NOT DETECTED NOT DETECTED Final   Escherichia coli NOT DETECTED NOT DETECTED Final   Klebsiella aerogenes NOT DETECTED NOT DETECTED Final   Klebsiella oxytoca NOT DETECTED NOT DETECTED Final   Klebsiella pneumoniae NOT DETECTED NOT DETECTED Final   Proteus species NOT DETECTED NOT DETECTED Final   Salmonella species NOT DETECTED NOT DETECTED Final   Serratia marcescens NOT DETECTED NOT DETECTED Final   Haemophilus influenzae NOT DETECTED NOT DETECTED Final   Neisseria meningitidis NOT DETECTED NOT DETECTED Final   Pseudomonas aeruginosa NOT DETECTED NOT DETECTED Final   Stenotrophomonas maltophilia NOT DETECTED NOT DETECTED Final   Candida albicans NOT DETECTED NOT DETECTED Final   Candida auris NOT DETECTED NOT DETECTED Final   Candida glabrata NOT DETECTED NOT DETECTED Final   Candida krusei NOT DETECTED NOT DETECTED Final   Candida  parapsilosis NOT DETECTED NOT DETECTED Final   Candida tropicalis NOT DETECTED NOT DETECTED Final   Cryptococcus neoformans/gattii NOT DETECTED NOT DETECTED Final   Meth resistant mecA/C and MREJ NOT DETECTED NOT DETECTED Final    Comment: Performed at [facility], [address], [place], [place] [address]  Blood culture (routine x 2)     Status: None (Preliminary result)   Collection Time: 03/15/23 11:08 AM   Specimen: BLOOD LEFT ARM  Result Value Ref Range Status   Specimen Description BLOOD LEFT ARM  Final   Special Requests   Final    BOTTLES DRAWN AEROBIC AND ANAEROBIC Blood Culture results may not be optimal due to an inadequate volume of blood received in culture bottles   Culture   Final    NO GROWTH < 24 HOURS Performed at [facility], [address], [place], [place] [address]    Report Status PENDING  Incomplete  Surgical pcr screen     Status: Abnormal   Collection Time: 03/15/23 11:45 AM   Specimen: Nasal Mucosa; Nasal Swab  Result Value Ref Range Status   MRSA, PCR (A) NEGATIVE Corrected    INVALID, UNABLE TO DETERMINE THE PRESENCE OF  TARGET DUE TO SPECIMEN INTEGRITY. RECOLLECTION REQUESTED.   Staphylococcus aureus (A) NEGATIVE Corrected    INVALID, UNABLE TO DETERMINE THE PRESENCE OF TARGET DUE TO SPECIMEN INTEGRITY. RECOLLECTION REQUESTED.    Comment: NOTIFIED RN Koleen Nimrod D ON 03/15/23 @ 1547 BY DRT Performed at [facility], [address], [place], [place] [address] CORRECTED ON 08/09 AT 1547: PREVIOUSLY REPORTED AS INVALID, UNABLE TO DETERMINE THE PRESENCE OF TARGET DUE TO SPECIMEN INTEGRITY. RECOLLECTION REQUESTED.   Aerobic/Anaerobic Culture w Gram Stain (surgical/deep wound)     Status: None (Preliminary result)   Collection Time: 03/15/23 12:48 PM   Specimen: Soft Tissue, Other  Result Value Ref Range Status   Specimen Description TISSUE  Final   Special Requests left knee  Final   Gram Stain   Final    ABUNDANT WBC PRESENT, PREDOMINANTLY  PMN RARE GRAM POSITIVE COCCI IN PAIRS    Culture   Final    RARE STAPHYLOCOCCUS AUREUS SUSCEPTIBILITIES TO FOLLOW Performed at [facility], [address] [place], [place] [address]    Report Status PENDING  Incomplete    Antimicrobials: Anti-infectives (From admission, onward)    Start     Dose/Rate Route Frequency Ordered Stop   03/15/23 1545  ceFAZolin (ANCEF) IVPB 2g/100 mL premix        2 g 200 mL/hr over 30 Minutes Intravenous Every 8 hours 03/15/23 1454     03/15/23 1220  vancomycin (VANCOCIN) powder  Status:  Discontinued          As needed 03/15/23 1221 03/15/23 1319   03/15/23 1145  ceFAZolin (ANCEF) IVPB 2g/100 mL premix        2 g 200 mL/hr over 30 Minutes Intravenous On call to O.R. 03/15/23 1134 03/15/23 1253   03/15/23 0600  ceFAZolin (ANCEF) IVPB 2g/100 mL premix  Status:  Discontinued        2 g 200 mL/hr over 30 Minutes Intravenous Every 8 hours 03/15/23 0159 03/15/23 1438   03/14/23 2245  ceFAZolin (ANCEF) IVPB 2g/100 mL premix        2 g 200 mL/hr over 30 Minutes Intravenous  Once 03/14/23 2234 03/15/23 0030      Culture/Microbiology    Component Value Date/Time   SDES TISSUE 03/15/2023 1248   SPECREQUEST left knee 03/15/2023 1248   CULT  03/15/2023 1248    RARE STAPHYLOCOCCUS AUREUS SUSCEPTIBILITIES TO FOLLOW Performed at [facility], [address], [place], [place] [address]    REPTSTATUS PENDING 03/15/2023 1248    Radiology Studies: [place] EKG SITE RITE  Result Date: 03/15/2023 If Site Rite image not attached, placement could not be confirmed due to current cardiac rhythm.    LOS: 1 day   Lanae Boast, MD [facility]  03/16/2023, 3:04 PM

## 2023-03-16 NOTE — Progress Notes (Signed)
ORTHOPAEDIC PROGRESS NOTE   s/p Procedure(s): IRRIGATION AND DEBRIDEMENT LEFT KNEE on 8/9 with Dr. Jena Gauss  SUBJECTIVE: Reports mild pain about operative site. Feels better than he did before surgery. Reports some burning in the knee.  No chest pain. No SOB. No nausea/vomiting. No other complaints.  OBJECTIVE: PE: General: sitting up in hospital bed, NAD LLE: dressing CDI, hemovac in place with about 25 cc of serosanguinous drainage. Compartments soft and compressible, intact EHL/TA/GSC, warm well perfused foot   Vitals:   03/16/23 0404 03/16/23 0733  BP: (!) 135/59 (!) 157/59  Pulse: (!) 59 63  Resp: 19 18  Temp: 97.9 F (36.6 C) 97.6 F (36.4 C)  SpO2: 95% 95%   Blood cultures showing Staph aureus Cultures from surgery showing rare gram positive cocci in pairs.   ASSESSMENT: Dean Mayer is a 82 y.o. male POD#1  PLAN: ID: appreciate ID consultation and recommendations.  Weightbearing: WBAT LLE Insicional and dressing care: Reinforce dressings as needed Hemovac to remain in place until discharge. Will plan to remove prior to discharge.  Orthopedic device(s): None Showering: Hold for now VTE prophylaxis: Aspirin Pain control: PRN pain medications, minimize narcotics as able Follow - up plan: 2 weeks in office with Dr. Jena Gauss Dispo: TBD.   Contact information: After hours and holidays please check [email or web address] for group call information for [facility]  Alfonse Alpers, PA-C 03/16/23

## 2023-03-17 ENCOUNTER — Inpatient Hospital Stay (HOSPITAL_COMMUNITY): Payer: Medicare HMO

## 2023-03-17 DIAGNOSIS — R011 Cardiac murmur, unspecified: Secondary | ICD-10-CM

## 2023-03-17 DIAGNOSIS — M00869 Arthritis due to other bacteria, unspecified knee: Secondary | ICD-10-CM | POA: Diagnosis not present

## 2023-03-17 LAB — COMPREHENSIVE METABOLIC PANEL
ALT: 19 U/L (ref 0–44)
AST: 26 U/L (ref 15–41)
Albumin: 1.7 g/dL — ABNORMAL LOW (ref 3.5–5.0)
Alkaline Phosphatase: 101 U/L (ref 38–126)
Anion gap: 12 (ref 5–15)
BUN: 12 mg/dL (ref 8–23)
CO2: 24 mmol/L (ref 22–32)
Calcium: 8.3 mg/dL — ABNORMAL LOW (ref 8.9–10.3)
Chloride: 100 mmol/L (ref 98–111)
Creatinine, Ser: 0.78 mg/dL (ref 0.61–1.24)
GFR, Estimated: >60 mL/min (ref >60)
Glucose, Bld: 158 mg/dL — ABNORMAL HIGH (ref 70–99)
Potassium: 3.6 mmol/L (ref 3.5–5.1)
Sodium: 136 mmol/L (ref 135–145)
Total Bilirubin: 0.2 mg/dL — ABNORMAL LOW (ref 0.3–1.2)
Total Protein: 5.1 g/dL — ABNORMAL LOW (ref 6.5–8.1)

## 2023-03-17 LAB — CULTURE, BLOOD (ROUTINE X 2)
Culture: NO GROWTH
Culture: NO GROWTH
Special Requests: ADEQUATE
Special Requests: ADEQUATE

## 2023-03-17 LAB — CBC
HCT: 36 % — ABNORMAL LOW (ref 39.0–52.0)
Hemoglobin: 11.4 g/dL — ABNORMAL LOW (ref 13.0–17.0)
MCH: 28.6 pg (ref 26.0–34.0)
MCHC: 31.7 g/dL (ref 30.0–36.0)
MCV: 90.5 fL (ref 80.0–100.0)
Platelets: 376 10*3/uL (ref 150–400)
RBC: 3.98 MIL/uL — ABNORMAL LOW (ref 4.22–5.81)
RDW: 13.6 % (ref 11.5–15.5)
WBC: 8.7 10*3/uL (ref 4.0–10.5)
nRBC: 0 % (ref 0.0–0.2)

## 2023-03-17 LAB — BODY FLUID CULTURE W GRAM STAIN

## 2023-03-17 LAB — ECHOCARDIOGRAM COMPLETE
Area-P 1/2: 3.03 cm2
Height: 76 in
S' Lateral: 2 cm
Weight: 3809.55 [oz_av]

## 2023-03-17 NOTE — Progress Notes (Signed)
Physical Therapy Treatment Patient Details Name: Dean Mayer MRN: [id number] DOB: 10-01-1940 Today's Date: 03/17/2023   History of Present Illness 82 y.o. male presenting with 2 week hx of L knee pain, found to have a septic L knee. He is now s/p I&D on 03/14/23 (day of admission). PMH of HTN, Throat and prostate CA.    PT Comments  Continuing work on functional mobility and activity tolerance;  Session focused on initial gait attempts and functional transfers; Pt nervous in anticipation of pain, but overall able to walk in his room with min assist and RW; trunk and hips flexed, but seems like more of a response to L knee pain with moving than true instability; anticipate good progress as pain is better managed   If plan is discharge home, recommend the following: A little help with walking and/or transfers;A little help with bathing/dressing/bathroom;Assist for transportation;Help with stairs or ramp for entrance;Assistance with cooking/housework   Can travel by private vehicle        Equipment Recommendations  None recommended by PT    Recommendations for Other Services       Precautions / Restrictions Precautions Precautions: Fall Restrictions Weight Bearing Restrictions: No LLE Weight Bearing: Weight bearing as tolerated     Mobility  Bed Mobility Overal bed mobility: Needs Assistance Bed Mobility: Supine to Sit     Supine to sit: Min assist     General bed mobility comments: Assist for LLE management and to use bed pad to help square hips sitting EOB    Transfers Overall transfer level: Needs assistance Equipment used: Rolling walker (2 wheels) Transfers: Sit to/from Stand Sit to Stand: Min assist           General transfer comment: Min assist to encouraged forward weight shift and power up to stand    Ambulation/Gait Ambulation/Gait assistance: Min assist Gait Distance (Feet): 10 Feet Assistive device: Rolling walker (2 wheels) Gait  Pattern/deviations: Step-to pattern, Shuffle, Trunk flexed       General Gait Details: Pt with difficulty fully extending bil hips, knees, and trunk for fully upright standing ("this is about as tall as I'm gonna get today"); Cues for sequence and RW advancement, as well as to use RW to [employer] painful L knee in stance   Stairs             Wheelchair Mobility     Tilt Bed    Modified Rankin (Stroke Patients Only)       Balance     Sitting balance-Leahy Scale: Good       Standing balance-Leahy Scale: Poor                              Cognition Arousal: Alert Behavior During Therapy: WFL for tasks assessed/performed Overall Cognitive Status: Within Functional Limits for tasks assessed                                 General Comments: very pleasant older man        Exercises      General Comments General comments (skin integrity, edema, etc.): Wife present and helpful during session      Pertinent Vitals/Pain Pain Assessment Pain Assessment: Faces Faces Pain Scale: Hurts even more Pain Location: L knee Pain Descriptors / Indicators: Aching, Discomfort Pain Intervention(s): Monitored during session    Home Living  Prior Function            PT Goals (current goals can now be found in the care plan section) Acute Rehab PT Goals Patient Stated Goal: to get back to walking without the RW PT Goal Formulation: With patient Time For Goal Achievement: 03/30/23 Potential to Achieve Goals: Good Progress towards PT goals: Progressing toward goals    Frequency    Min 1X/week      PT Plan      Co-evaluation              AM-PAC PT "6 Clicks" Mobility   Outcome Measure  Help needed turning from your back to your side while in a flat bed without using bedrails?: A Little Help needed moving from lying on your back to sitting on the side of a flat bed without using bedrails?: A  Little Help needed moving to and from a bed to a chair (including a wheelchair)?: A Little Help needed standing up from a chair using your arms (e.g., wheelchair or bedside chair)?: A Little Help needed to walk in hospital room?: A Little Help needed climbing 3-5 steps with a railing? : A Lot 6 Click Score: 17    End of Session Equipment Utilized During Treatment: Gait belt Activity Tolerance: Patient limited by pain Patient left: in chair;with call bell/phone within reach;with chair alarm set;with family/visitor present Nurse Communication: Mobility status (and plan to eat lunch OOB) PT Visit Diagnosis: Muscle weakness (generalized) (M62.81);Difficulty in walking, not elsewhere classified (R26.2);Pain Pain - Right/Left: Left Pain - part of body: Knee     Time: 1230-1308 PT Time Calculation (min) (ACUTE ONLY): 38 min  Charges:    $Gait Training: 8-22 mins $Therapeutic Activity: 23-37 mins PT General Charges $$ ACUTE PT VISIT: 1 Visit                     Van Clines, PT  Acute Rehabilitation Services Office [phone] Secure Chat welcomed    Dean Mayer 03/17/2023, 2:59 PM

## 2023-03-17 NOTE — Progress Notes (Signed)
PROGRESS NOTE Dean Mayer  IHK:[id number] DOB: October 17, 1940 DOA: 03/14/2023 PCP: Angelique Blonder, MD  Brief Narrative/Hospital Course: 82yom w/ history of gout, HTN, HLD, BPH, hypothyroidism, throat cancer, positive ANA, positive rheumatoid factor who on 7/29 saw rheumatologist with complaints of left knee pain and swelling. At that point patient's knee was tapped and sent for synovial fluid analysis.  Patient is given intra-articular steroid joint infection.  Fluid analysis shows gout, he was started on allopurinol.  On 7/31 patient recontacted rheumatologist as his pain and swelling persisted.  He was prescribed a 9-day prednisone taper. Per patient a temporal improvement then his pain worsened.  He was not getting around with a walker.  He had difficulty getting in bed, out of bed, around the house and seen  in ED 03/11/2023>repeat arthrocentesis done AND discharged with hydrocodone, was given orthopedics referral but returned to ED 8/30 tonight  as he developed nausea and vomiting, fever Tmax 100.7 and knee remained painful. 7/29 joint aspiration>75,000 WBCs, cultures grew pansensitive MSSA and was positive for monosodium urate.  one 03/11/2023 synovial fluid>26,000 WBCs rare gram-positive cocci, no crystals noted. ESR 14.1, CRP 97 In the ER,repeat tap of his left knee done. He was afebrile.Left knee remains painful with much decreased range of motion.  Orthopedics on-call Dr. Susa Simmonds consulted and  admitted for MSSA left knee septic arthritis 8/9: Incision and drainage of left septic knee Dr Jena Gauss    Subjective: Patient seen and examined this morning Wife at the bedside earlier was having pain in his left knee and improving after taking hydrocodone Overnight patient has been afebrile, BP stable on room air Labs with a stable CBC CMP with element 1.6 Remains in pain control with Vicodin, Tylenol has not needed IV Dilaudid since 8/9  Assessment and Plan: Principal Problem:   Bacterial infection  of knee joint (HCC)  MSSA sepsis MSSA septic arthritis left knee Left knee pain x 3 to 4 weeks Initially suspected left acute flare up: 8/9: Incision and drainage of left septic knee Dr Jena Gauss.  Synovial fluid 8/5- MSSA. and culture from OR  8/9> shows  Staph aureus  C/S pending.Blood culture 8/8 with MSS. Repeat blood culture 03/15/23 NGTD. ppreciate ID inputs on board> need to eval other nidus of infection, follow-up TTE results pending- ?TEE need- defer to ID. MRI thoracic and lumbar spine w/o 8/10: no acute finding. Continue IV cefazolin 2 g q 8 hours-will need at least 4 weeks of antibiotics from day of clearance of bacteremia possibly longer.  ?Acute gout flareup: continue allopurinol, s/p colchicine. cont pain control.  Try to avoid steroids initially given infection process  Elevated LFTs W/ Hypoalbuminemia, new: trend? Sepsis related  Hyperglycemia: Fu hba1c  HTN: BP controlled, continue home amlodipine 10 and lisinopril 5.    HLD: Cont atorvastatin  Normocytic anemia mild: Monitor hb as below. Recent Labs  Lab 03/11/23 0843 03/14/23 2155 03/15/23 0359 03/16/23 0112  HGB 12.5* 12.7* 12.2* 11.5*  HCT 38.6* 39.5 37.3* 34.8*     BPH: Cont Flomax    Hypothyroidism Cont synthroid   History of throat cancer   Cataracts Cont eye drops  DVT prophylaxis: SCDs Start: 03/15/23 1439 asa 325 mg daily Code Status:   Code Status: DNR Family Communication: plan of care discussed with patient at bedside. Patient status is: Inpatient because of septic knee Level of care: Med-Surg   Dispo: The patient is from: home            Anticipated disposition: Pending  clinical improvement Objective: Vitals last 24 hrs: Vitals:   03/16/23 1644 03/16/23 2012 03/17/23 0436 03/17/23 0839  BP: (!) 132/59 (!) 169/72 (!) 152/64 (!) 144/69  Pulse: 70 74 65 71  Resp:  18 18 16   Temp: 97.9 F (36.6 C) 98 F (36.7 C) (!) 97.5 F (36.4 C) 98.2 F (36.8 C)  TempSrc: Oral Oral Oral  Oral  SpO2: 97% 96% 96% 97%  Weight:      Height:       Weight change:   Physical Examination: General exam: alert awake, oriented at baseline, older than stated age HEENT:Oral mucosa moist, Ear/Nose WNL grossly Respiratory system: Bilaterally clear BS,no use of accessory muscle Cardiovascular system: S1 & S2 +, No JVD. Gastrointestinal system: Abdomen soft,NT,ND, BS+ Nervous System: Alert, awake, moving all extremities Extremities: LE edema neg, lt knee w/ vac and dressing+ Skin: No rashes,no icterus. MSK: Normal muscle bulk,tone, power   Medications reviewed:  Scheduled Meds:  allopurinol  100 mg Oral Daily   amLODipine  10 mg Oral Daily   ascorbic acid  1,000 mg Oral Daily   aspirin EC  325 mg Oral Daily   atorvastatin  40 mg Oral QPM   brimonidine  1 drop Right Eye BID   docusate sodium  100 mg Oral BID   ferrous sulfate   Oral Daily   levothyroxine  100 mcg Oral Daily   lisinopril  5 mg Oral BID   omega-3 acid ethyl esters  1 g Oral Daily   tamsulosin  0.4 mg Oral Daily  Continuous Infusions:  sodium chloride 75 mL/hr at 03/16/23 0624    ceFAZolin (ANCEF) IV 2 g (03/17/23 8295)   methocarbamol (ROBAXIN) IV      Diet Order             Diet Heart Room service appropriate? Yes; Fluid consistency: Thin  Diet effective now                  Intake/Output Summary (Last 24 hours) at 03/17/2023 1105 Last data filed at 03/17/2023 6213 Gross per 24 hour  Intake 1287.48 ml  Output 940 ml  Net 347.48 ml   Net IO Since Admission: -672.52 mL [03/17/23 1105]  Wt Readings from Last 3 Encounters:  03/15/23 108 kg  03/11/23 108 kg  02/20/23 108 kg     Unresulted Labs (From admission, onward)     Start     Ordered   03/22/23 0500  Creatinine, serum  (enoxaparin (LOVENOX)    CrCl >/= 30 ml/min)  Weekly,   R     Comments: while on enoxaparin therapy    03/15/23 0130   03/17/23 1047  Culture, blood (Routine X 2) w Reflex to ID Panel  BLOOD CULTURE X 2,   R (with TIMED  occurrences)      03/17/23 1046   03/16/23 0500  CBC  Daily,   R      03/15/23 1049   03/16/23 0500  Comprehensive metabolic panel  Daily,   R      03/15/23 1049   03/15/23 1248  Fungus Culture With Stain  RELEASE UPON ORDERING,   TIMED       Comments: Specimen A: Pre-op diagnosis: SEPTIC JOINT    03/15/23 1248   03/15/23 0500  CBC with Differential/Platelet  Tomorrow morning,   R        03/15/23 0130          Data Reviewed: I have personally reviewed following  labs and imaging studies CBC: Recent Labs  Lab 03/11/23 0843 03/14/23 2155 03/15/23 0359 03/16/23 0112  WBC 10.5 9.5 8.6 9.6  NEUTROABS 9.1*  --  7.1  --   HGB 12.5* 12.7* 12.2* 11.5*  HCT 38.6* 39.5 37.3* 34.8*  MCV 89.8 87.4 87.1 87.7  PLT 339 369 357 335   Basic Metabolic Panel: Recent Labs  Lab 03/11/23 0843 03/14/23 2155 03/15/23 0302 03/16/23 0112  NA 138 137 134* 132*  K 4.1 4.2 3.8 4.1  CL 98 100 98 98  CO2 27 27 25 23   GLUCOSE 139* 155* 141* 175*  BUN 27* 23 21 18   CREATININE 0.87 0.86 0.77 0.90  CALCIUM 9.1 9.0 8.4* 8.3*   GFR: Estimated Creatinine Clearance: 88.2 mL/min (by C-G formula based on SCr of 0.9 mg/dL). Liver Function Tests: Recent Labs  Lab 03/14/23 2155 03/15/23 0302 03/16/23 0112  AST 52* 38 22  ALT 62* 53* 31  ALKPHOS 138* 120 105  BILITOT 0.5 0.7 0.2*  PROT 5.7* 5.8* 5.2*  ALBUMIN 1.9* 1.9* 1.6*   Recent Labs  Lab 03/15/23 0302  INR 1.1   Recent Results (from the past 240 hour(s))  Body fluid culture w Gram Stain     Status: None   Collection Time: 03/11/23  8:39 AM   Specimen: Synovium; Body Fluid  Result Value Ref Range Status   Specimen Description SYNOVIAL  Final   Special Requests LEFT KNEE  Final   Gram Stain   Final    MODERATE WBC PRESENT, PREDOMINANTLY PMN NO ORGANISMS SEEN Performed at [facility], [address], [place], [place] [address]    Culture FEW STAPHYLOCOCCUS AUREUS  Final   Report Status 03/14/2023 FINAL  Final   Organism  ID, Bacteria STAPHYLOCOCCUS AUREUS  Final      Susceptibility   Staphylococcus aureus - MIC*    CIPROFLOXACIN 1 SENSITIVE Sensitive     ERYTHROMYCIN <=0.25 SENSITIVE Sensitive     GENTAMICIN <=0.5 SENSITIVE Sensitive     OXACILLIN <=0.25 SENSITIVE Sensitive     TETRACYCLINE <=1 SENSITIVE Sensitive     VANCOMYCIN <=0.5 SENSITIVE Sensitive     TRIMETH/SULFA <=10 SENSITIVE Sensitive     CLINDAMYCIN <=0.25 SENSITIVE Sensitive     RIFAMPIN <=0.5 SENSITIVE Sensitive     Inducible Clindamycin NEGATIVE Sensitive     LINEZOLID 2 SENSITIVE Sensitive     * FEW STAPHYLOCOCCUS AUREUS  Blood culture (routine x 2)     Status: Abnormal   Collection Time: 03/14/23  9:39 PM   Specimen: BLOOD  Result Value Ref Range Status   Specimen Description BLOOD RIGHT ANTECUBITAL  Final   Special Requests   Final    BOTTLES DRAWN AEROBIC AND ANAEROBIC Blood Culture results may not be optimal due to an excessive volume of blood received in culture bottles   Culture  Setup Time   Final    GRAM POSITIVE COCCI IN CLUSTERS IN BOTH AEROBIC AND ANAEROBIC BOTTLES CRITICAL RESULT CALLED TO, READ BACK BY AND VERIFIED WITH: PHARMD [id number] TO SAVANNAH MOODY, ADC Performed at [facility], [address] [place], [place] [address]    Culture STAPHYLOCOCCUS AUREUS (A)  Final   Report Status 03/17/2023 FINAL  Final   Organism ID, Bacteria STAPHYLOCOCCUS AUREUS  Final      Susceptibility   Staphylococcus aureus - MIC*    CIPROFLOXACIN 1 SENSITIVE Sensitive     ERYTHROMYCIN <=0.25 SENSITIVE Sensitive     GENTAMICIN <=0.5 SENSITIVE  Sensitive     OXACILLIN <=0.25 SENSITIVE Sensitive     TETRACYCLINE <=1 SENSITIVE Sensitive     VANCOMYCIN <=0.5 SENSITIVE Sensitive     TRIMETH/SULFA <=10 SENSITIVE Sensitive     CLINDAMYCIN <=0.25 SENSITIVE Sensitive     RIFAMPIN <=0.5 SENSITIVE Sensitive     Inducible Clindamycin NEGATIVE Sensitive     LINEZOLID 2 SENSITIVE Sensitive     * STAPHYLOCOCCUS AUREUS  Body fluid  culture w Gram Stain     Status: None   Collection Time: 03/14/23  9:39 PM   Specimen: Joint, Knee; Body Fluid  Result Value Ref Range Status   Specimen Description JOINT FLUID KNEE  Final   Special Requests NONE  Final   Gram Stain   Final    MODERATE WBC PRESENT,BOTH PMN AND MONONUCLEAR RARE GRAM POSITIVE COCCI Performed at [facility], [address], [place], [place] [address]    Culture RARE STAPHYLOCOCCUS AUREUS  Final   Report Status 03/17/2023 FINAL  Final   Organism ID, Bacteria STAPHYLOCOCCUS AUREUS  Final      Susceptibility   Staphylococcus aureus - MIC*    CIPROFLOXACIN 1 SENSITIVE Sensitive     ERYTHROMYCIN <=0.25 SENSITIVE Sensitive     GENTAMICIN <=0.5 SENSITIVE Sensitive     OXACILLIN <=0.25 SENSITIVE Sensitive     TETRACYCLINE <=1 SENSITIVE Sensitive     VANCOMYCIN <=0.5 SENSITIVE Sensitive     TRIMETH/SULFA <=10 SENSITIVE Sensitive     CLINDAMYCIN <=0.25 SENSITIVE Sensitive     RIFAMPIN <=0.5 SENSITIVE Sensitive     Inducible Clindamycin NEGATIVE Sensitive     LINEZOLID 2 SENSITIVE Sensitive     * RARE STAPHYLOCOCCUS AUREUS  Blood Culture ID Panel (Reflexed)     Status: Abnormal   Collection Time: 03/14/23  9:39 PM  Result Value Ref Range Status   Enterococcus faecalis NOT DETECTED NOT DETECTED Final   Enterococcus Faecium NOT DETECTED NOT DETECTED Final   Listeria monocytogenes NOT DETECTED NOT DETECTED Final   Staphylococcus species DETECTED (A) NOT DETECTED Final    Comment: CRITICAL RESULT CALLED TO, READ BACK BY AND VERIFIED WITH: PHARMD SAVANNAH MOODY [phone], ADC    Staphylococcus aureus (BCID) DETECTED (A) NOT DETECTED Final    Comment: CRITICAL RESULT CALLED TO, READ BACK BY AND VERIFIED WITH: PHARMD SAVANNAH MOODY [id number], ADC    Staphylococcus epidermidis NOT DETECTED NOT DETECTED Final   Staphylococcus lugdunensis NOT DETECTED NOT DETECTED Final   Streptococcus species NOT DETECTED NOT DETECTED Final   Streptococcus agalactiae  NOT DETECTED NOT DETECTED Final   Streptococcus pneumoniae NOT DETECTED NOT DETECTED Final   Streptococcus pyogenes NOT DETECTED NOT DETECTED Final   A.calcoaceticus-baumannii NOT DETECTED NOT DETECTED Final   Bacteroides fragilis NOT DETECTED NOT DETECTED Final   Enterobacterales NOT DETECTED NOT DETECTED Final   Enterobacter cloacae complex NOT DETECTED NOT DETECTED Final   Escherichia coli NOT DETECTED NOT DETECTED Final   Klebsiella aerogenes NOT DETECTED NOT DETECTED Final   Klebsiella oxytoca NOT DETECTED NOT DETECTED Final   Klebsiella pneumoniae NOT DETECTED NOT DETECTED Final   Proteus species NOT DETECTED NOT DETECTED Final   Salmonella species NOT DETECTED NOT DETECTED Final   Serratia marcescens NOT DETECTED NOT DETECTED Final   Haemophilus influenzae NOT DETECTED NOT DETECTED Final   Neisseria meningitidis NOT DETECTED NOT DETECTED Final   Pseudomonas aeruginosa NOT DETECTED NOT DETECTED Final   Stenotrophomonas maltophilia NOT DETECTED NOT DETECTED Final   Candida albicans NOT DETECTED NOT DETECTED  Final   Candida auris NOT DETECTED NOT DETECTED Final   Candida glabrata NOT DETECTED NOT DETECTED Final   Candida krusei NOT DETECTED NOT DETECTED Final   Candida parapsilosis NOT DETECTED NOT DETECTED Final   Candida tropicalis NOT DETECTED NOT DETECTED Final   Cryptococcus neoformans/gattii NOT DETECTED NOT DETECTED Final   Meth resistant mecA/C and MREJ NOT DETECTED NOT DETECTED Final    Comment: Performed at [facility], [address], [place], [place] [address]  Blood culture (routine x 2)     Status: None (Preliminary result)   Collection Time: 03/15/23 11:08 AM   Specimen: BLOOD LEFT ARM  Result Value Ref Range Status   Specimen Description BLOOD LEFT ARM  Final   Special Requests   Final    BOTTLES DRAWN AEROBIC AND ANAEROBIC Blood Culture results may not be optimal due to an inadequate volume of blood received in culture bottles   Culture   Final    NO  GROWTH < 24 HOURS Performed at [facility], [address] [place], [place] [address]    Report Status PENDING  Incomplete  Surgical pcr screen     Status: Abnormal   Collection Time: 03/15/23 11:45 AM   Specimen: Nasal Mucosa; Nasal Swab  Result Value Ref Range Status   MRSA, PCR (A) NEGATIVE Corrected    INVALID, UNABLE TO DETERMINE THE PRESENCE OF TARGET DUE TO SPECIMEN INTEGRITY. RECOLLECTION REQUESTED.   Staphylococcus aureus (A) NEGATIVE Corrected    INVALID, UNABLE TO DETERMINE THE PRESENCE OF TARGET DUE TO SPECIMEN INTEGRITY. RECOLLECTION REQUESTED.    Comment: NOTIFIED RN Koleen Nimrod D ON 03/15/23 @ 1547 BY DRT Performed at [facility], [address] [place], [place] [address] CORRECTED ON 08/09 AT 1547: PREVIOUSLY REPORTED AS INVALID, UNABLE TO DETERMINE THE PRESENCE OF TARGET DUE TO SPECIMEN INTEGRITY. RECOLLECTION REQUESTED.   Aerobic/Anaerobic Culture w Gram Stain (surgical/deep wound)     Status: None (Preliminary result)   Collection Time: 03/15/23 12:48 PM   Specimen: Soft Tissue, Other  Result Value Ref Range Status   Specimen Description TISSUE  Final   Special Requests left knee  Final   Gram Stain   Final    ABUNDANT WBC PRESENT, PREDOMINANTLY PMN RARE GRAM POSITIVE COCCI IN PAIRS    Culture   Final    RARE STAPHYLOCOCCUS AUREUS SUSCEPTIBILITIES TO FOLLOW Performed at [facility], [address], [place], [place] [address]    Report Status PENDING  Incomplete    Antimicrobials: Anti-infectives (From admission, onward)    Start     Dose/Rate Route Frequency Ordered Stop   03/15/23 1545  ceFAZolin (ANCEF) IVPB 2g/100 mL premix        2 g 200 mL/hr over 30 Minutes Intravenous Every 8 hours 03/15/23 1454     03/15/23 1220  vancomycin (VANCOCIN) powder  Status:  Discontinued          As needed 03/15/23 1221 03/15/23 1319   03/15/23 1145  ceFAZolin (ANCEF) IVPB 2g/100 mL premix        2 g 200 mL/hr over 30 Minutes Intravenous On call to O.R.  03/15/23 1134 03/15/23 1253   03/15/23 0600  ceFAZolin (ANCEF) IVPB 2g/100 mL premix  Status:  Discontinued        2 g 200 mL/hr over 30 Minutes Intravenous Every 8 hours 03/15/23 0159 03/15/23 1438   03/14/23 2245  ceFAZolin (ANCEF) IVPB 2g/100 mL premix  2 g 200 mL/hr over 30 Minutes Intravenous  Once 03/14/23 2234 03/15/23 0030      Culture/Microbiology    Component Value Date/Time   SDES TISSUE 03/15/2023 1248   SPECREQUEST left knee 03/15/2023 1248   CULT  03/15/2023 1248    RARE STAPHYLOCOCCUS AUREUS SUSCEPTIBILITIES TO FOLLOW Performed at [facility], [address] [place], [place] [address]    REPTSTATUS PENDING 03/15/2023 1248    Radiology Studies: MR THORACIC SPINE WO CONTRAST  Result Date: 03/16/2023 CLINICAL DATA:  Mid back pain with infection suspected EXAM: MRI THORACIC AND LUMBAR SPINE WITHOUT CONTRAST TECHNIQUE: Multiplanar and multiecho pulse sequences of the thoracic and lumbar spine were obtained without intravenous contrast. COMPARISON:  None Available. FINDINGS: MRI THORACIC SPINE FINDINGS Alignment:  Physiologic. Vertebrae: No fracture, evidence of discitis, or bone lesion. Schmorl's nodes at the opposing endplates at T7-8 and at the superior endplates of T9 and T10. T10 hemangioma. Cord:  Normal Paraspinal and other soft tissues: Normal Disc levels: No spinal canal stenosis.  No neural impingement. MRI LUMBAR SPINE FINDINGS Segmentation:  Standard. Alignment:  Physiologic. Vertebrae:  No fracture, evidence of discitis, or bone lesion. Conus medullaris and cauda equina: Conus extends to the L1 level. Conus and cauda equina appear normal. Paraspinal and other soft tissues: Negative Disc levels: L1-L2: Normal disc space and facet joints. No spinal canal stenosis. No neural foraminal stenosis. L2-L3: Normal disc space and facet joints. No spinal canal stenosis. No neural foraminal stenosis. L3-L4: Disc space narrowing with intermediate sized right asymmetric  bulge. Right lateral recess narrowing without central spinal canal stenosis. Mild right neural foraminal stenosis. L4-L5: Intermediate sized disc bulge with mild facet hypertrophy. Left lateral recess narrowing without central spinal canal stenosis. Moderate left neural foraminal stenosis. L5-S1: Left asymmetric disc bulge with moderate facet hypertrophy. No spinal canal stenosis. Mild right and moderate left neural foraminal stenosis. Visualized sacrum: Normal. IMPRESSION: 1. No acute abnormality of the thoracic or lumbar spine. 2. 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 3. Right lateral recess narrowing at L3-4 and left lateral recess narrowing at L4-5. Correlate for right L4 and/or left L5 radiculopathy. 4. 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 Electronically Signed   By: Deatra Robinson M.D.   On: 03/16/2023 21:36   MR LUMBAR SPINE WO CONTRAST  Result Date: 03/16/2023 CLINICAL DATA:  Mid back pain with infection suspected EXAM: MRI THORACIC AND LUMBAR SPINE WITHOUT CONTRAST TECHNIQUE: Multiplanar and multiecho pulse sequences of the thoracic and lumbar spine were obtained without intravenous contrast. COMPARISON:  None Available. FINDINGS: MRI THORACIC SPINE FINDINGS Alignment:  Physiologic. Vertebrae: No fracture, evidence of discitis, or bone lesion. Schmorl's nodes at the opposing endplates at T7-8 and at the superior endplates of T9 and T10. T10 hemangioma. Cord:  Normal Paraspinal and other soft tissues: Normal Disc levels: No spinal canal stenosis.  No neural impingement. MRI LUMBAR SPINE FINDINGS Segmentation:  Standard. Alignment:  Physiologic. Vertebrae:  No fracture, evidence of discitis, or bone lesion. Conus medullaris and cauda equina: Conus extends to the L1 level. Conus and cauda equina appear normal. Paraspinal and other soft tissues: Negative Disc levels: L1-L2: Normal disc space and facet joints. No spinal canal stenosis. No  neural foraminal stenosis. L2-L3: Normal disc space and facet joints. No spinal canal stenosis. No neural foraminal stenosis. L3-L4: Disc space narrowing with intermediate sized right asymmetric bulge. Right lateral recess narrowing without central spinal canal stenosis. Mild right neural foraminal  stenosis. L4-L5: Intermediate sized disc bulge with mild facet hypertrophy. Left lateral recess narrowing without central spinal canal stenosis. Moderate left neural foraminal stenosis. L5-S1: Left asymmetric disc bulge with moderate facet hypertrophy. No spinal canal stenosis. Mild right and moderate left neural foraminal stenosis. Visualized sacrum: Normal. IMPRESSION: 1. No acute abnormality of the thoracic or lumbar spine. 2. 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 3. Right lateral recess narrowing at L3-4 and left lateral recess narrowing at L4-5. Correlate for right L4 and/or left L5 radiculopathy. 4. 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 Electronically Signed   By: Deatra Robinson M.D.   On: 03/16/2023 21:36   [place] EKG SITE RITE  Result Date: 03/15/2023 If Site Rite image not attached, placement could not be confirmed due to current cardiac rhythm.    LOS: 2 days   Lanae Boast, MD [facility]  03/17/2023, 11:05 AM

## 2023-03-17 NOTE — Progress Notes (Signed)
ORTHOPAEDIC PROGRESS NOTE   s/p Procedure(s): IRRIGATION AND DEBRIDEMENT LEFT KNEE on 8/9 with Dr. Jena Gauss  SUBJECTIVE: Reports mild to moderate pain about operative site. Did well with therapy yesterday. Echo to be performed today.   No chest pain. No SOB. No nausea/vomiting. No other complaints.  OBJECTIVE: PE: General: sitting up in hospital bed, NAD LLE: dressing CDI, hemovac in place with < 50cc of serosanguinous drainage. Compartments soft and compressible, intact EHL/TA/GSC, warm well perfused foot   Vitals:   03/17/23 0436 03/17/23 0839  BP: (!) 152/64 (!) 144/69  Pulse: 65 71  Resp: 18 16  Temp: (!) 97.5 F (36.4 C) 98.2 F (36.8 C)  SpO2: 96% 97%   Blood cultures showing Staph aureus Cultures from surgery Staph aureus with susceptibilities to follow  ASSESSMENT: Dean Mayer is a 82 y.o. male POD#2  PLAN: ID: appreciate ID consultation and recommendations.  Weightbearing: WBAT LLE Insicional and dressing care: Reinforce dressings as needed Hemovac to remain in place until discharge. Will plan to remove prior to discharge.  Orthopedic device(s): None Showering: Hold for now VTE prophylaxis: Aspirin Pain control: PRN pain medications, minimize narcotics as able Follow - up plan: 2 weeks in office with Dr. Jena Gauss Dispo: TBD.   Contact information: After hours and holidays please check [email or web address] for group call information for [facility]  Alfonse Alpers, PA-C 03/17/23

## 2023-03-17 NOTE — Progress Notes (Signed)
Physical Therapy Treatment Patient Details Name: Dean Mayer MRN: [id number] DOB: January 18, 1941 Today's Date: 03/17/2023   History of Present Illness 82 y.o. male presenting with 2 week hx of L knee pain, found to have a septic L knee. He is now s/p I&D on 03/14/23 (day of admission). PMH of HTN, Throat and prostate CA.    PT Comments  Continuing work on functional mobility and activity tolerance;  Session focused on bed mobility and education re: knee precautions/positioning; Will need reinforcement re: keeping knee completely straight at rest for optimal healing; Pt was in a considerable amount of pain, and while he verbalized understanding, he still requested the knees of the bed be raised     If plan is discharge home, recommend the following: A little help with walking and/or transfers;A little help with bathing/dressing/bathroom;Assist for transportation;Help with stairs or ramp for entrance;Assistance with cooking/housework   Can travel by private vehicle        Equipment Recommendations  None recommended by PT    Recommendations for Other Services       Precautions / Restrictions Precautions Precautions: Fall Restrictions Weight Bearing Restrictions: No LLE Weight Bearing: Weight bearing as tolerated     Mobility  Bed Mobility Overal bed mobility: Needs Assistance Bed Mobility: Sit to Supine     Supine to sit: Min assist Sit to supine: Min assist   General bed mobility comments: Cues for technique and Min assist for LLE onto bed; slow moving; goo duse of bedrails to center trunk in the bed; able to pull self up to [facility] using headboard bar    Transfers Overall transfer level: Needs assistance Equipment used: Rolling walker (2 wheels) Transfers: Sit to/from Stand Sit to Stand: Min assist           General transfer comment: Min assist to encouraged forward weight shift and power up to stand    Ambulation/Gait Ambulation/Gait assistance: Min assist Gait  Distance (Feet): 10 Feet Assistive device: Rolling walker (2 wheels) Gait Pattern/deviations: Step-to pattern, Shuffle, Trunk flexed       General Gait Details: Pt with difficulty fully extending bil hips, knees, and trunk for fully upright standing ("this is about as tall as I'm gonna get today"); Cues for sequence and RW advancement, as well as to use RW to [employer] painful L knee in stance   Stairs             Wheelchair Mobility     Tilt Bed    Modified Rankin (Stroke Patients Only)       Balance     Sitting balance-Leahy Scale: Good       Standing balance-Leahy Scale: Poor                              Cognition Arousal: Alert Behavior During Therapy: WFL for tasks assessed/performed Overall Cognitive Status: Within Functional Limits for tasks assessed                                 General Comments: very pleasant older man        Exercises      General Comments General comments (skin integrity, edema, etc.): Wife present during session; Provided education re: positioning in the bed; Encouraged pt to keep L knee fully extended at rest; educated pt on nothing under knee      Pertinent Vitals/Pain Pain  Assessment Pain Assessment: Faces Faces Pain Scale: Hurts even more Pain Location: L knee Pain Descriptors / Indicators: Aching, Discomfort Pain Intervention(s): Monitored during session, Repositioned    Home Living                          Prior Function            PT Goals (current goals can now be found in the care plan section) Acute Rehab PT Goals Patient Stated Goal: to get back to walking without the RW PT Goal Formulation: With patient Time For Goal Achievement: 03/30/23 Potential to Achieve Goals: Good Progress towards PT goals: Progressing toward goals    Frequency    Min 1X/week      PT Plan      Co-evaluation              AM-PAC PT "6 Clicks" Mobility   Outcome Measure   Help needed turning from your back to your side while in a flat bed without using bedrails?: A Little Help needed moving from lying on your back to sitting on the side of a flat bed without using bedrails?: A Little Help needed moving to and from a bed to a chair (including a wheelchair)?: A Little Help needed standing up from a chair using your arms (e.g., wheelchair or bedside chair)?: A Little Help needed to walk in hospital room?: A Little Help needed climbing 3-5 steps with a railing? : A Lot 6 Click Score: 17    End of Session Equipment Utilized During Treatment: Gait belt Activity Tolerance: Patient limited by pain Patient left: in bed;with call bell/phone within reach;with family/visitor present Nurse Communication: Mobility status PT Visit Diagnosis: Muscle weakness (generalized) (M62.81);Difficulty in walking, not elsewhere classified (R26.2);Pain Pain - Right/Left: Left Pain - part of body: Knee     Time: 1542-1600 PT Time Calculation (min) (ACUTE ONLY): 18 min  Charges:    $Gait Training: 8-22 mins $Therapeutic Activity: 8-22 mins PT General Charges $$ ACUTE PT VISIT: 1 Visit                     Van Clines, PT  Acute Rehabilitation Services Office [phone] Secure Chat welcomed    Levi Aland 03/17/2023, 5:29 PM

## 2023-03-17 NOTE — Progress Notes (Signed)
  Echocardiogram 2D Echocardiogram has been performed.  Dean Mayer 03/17/2023, 10:18 AM

## 2023-03-18 DIAGNOSIS — B9561 Methicillin susceptible Staphylococcus aureus infection as the cause of diseases classified elsewhere: Secondary | ICD-10-CM | POA: Diagnosis not present

## 2023-03-18 DIAGNOSIS — M00869 Arthritis due to other bacteria, unspecified knee: Secondary | ICD-10-CM | POA: Diagnosis not present

## 2023-03-18 DIAGNOSIS — R7881 Bacteremia: Secondary | ICD-10-CM | POA: Diagnosis not present

## 2023-03-18 DIAGNOSIS — M109 Gout, unspecified: Secondary | ICD-10-CM | POA: Diagnosis not present

## 2023-03-18 NOTE — Progress Notes (Signed)
[facility]  Date of Admission:  03/14/2023      Total days of antibiotics 3   Cefazolin          ASSESSMENT: Dean Mayer is a 82 y.o. male admitted for management of infected left native knee with secondary MSSA bacteremia from home.   MSSA Bacteremia -  -h/o throat cancer + radiation tx in the past with some ongoing esophageal dysmotility symptoms. Discussed this would increase risk with TEE and decided to forego this and continue with 6 weeks of parenteral antibiotics. -Back MRI negative - he also has improved pain here -2/2 Left native knee septic arthritis.  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 No Cx done then and started on allopurinol.  -admitted with systemic symptoms of fevers/chills/sweats. -S/P open washout  8/9 ([facility]) with MSSA growing on cultures.  -Continue cefazolin IV   Vascular Access -  -PICC Line est Wednesday to allow for blood to clear.   Planning for home IV antibiotics 6 weeks from 03/15/2023    PLAN: No TEE Hold on PICC until Wednesday  Planning IV cefazolin x 6 weeks from 8/9 surgery MRI shows no vertebral infection - he seems improved and all acutely related to uncomfortable bed/stretcher    Principal Problem:   Bacterial infection of knee joint (HCC)    allopurinol  100 mg Oral Daily   amLODipine  10 mg Oral Daily   ascorbic acid  1,000 mg Oral Daily   aspirin EC  325 mg Oral Daily   atorvastatin  40 mg Oral QPM   brimonidine  1 drop Right Eye BID   docusate sodium  100 mg Oral BID   ferrous sulfate   Oral Daily   levothyroxine  100 mcg Oral Daily   lisinopril  5 mg Oral BID   omega-3 acid ethyl esters  1 g Oral Daily   tamsulosin  0.4 mg Oral Daily    SUBJECTIVE: Visited  with Mr. Dean Mayer and his wife at the bedside.  He has resolution of back pain - attributes this to poor beds. No fevers Had drain pulled today from knee    Review of Systems: Review of Systems  Constitutional:  Negative for chills and fever.  Gastrointestinal:  Negative for abdominal pain, diarrhea, nausea and vomiting.  Musculoskeletal:  Positive for joint pain (left knee).    No Known Allergies  OBJECTIVE: Vitals:   03/17/23 2100 03/18/23 0524 03/18/23 0816 03/18/23 1006  BP: (!) 157/61 (!) 164/59 (!) 170/65 (!) 165/59  Pulse: 78 74 80   Resp: 20 18 17    Temp: 98.9 F (37.2 C) 98.6 F (37 C) 98.4 F (36.9 C)   TempSrc: Oral Oral Oral   SpO2: 96% 96% 93%   Weight:      Height:       Body mass index is 28.98 kg/m.  Physical Exam Constitutional:      Appearance: Normal appearance. He is not ill-appearing.  Cardiovascular:     Rate and Rhythm: Normal rate.  Pulmonary:     Effort: Pulmonary effort is normal.     Breath sounds: Normal breath sounds.  Abdominal:     General: Bowel sounds are normal. There is no distension.  Palpations: Abdomen is soft.  Musculoskeletal:     Comments: Lt knee open to air with clean incision. No drainage. Some bloody drainage from lateral aspect of knee where drain was pulled   Skin:    General: Skin is warm and dry.  Neurological:     Mental Status: He is alert and oriented to person, place, and time.     Lab Results Lab Results  Component Value Date   WBC 7.0 03/18/2023   HGB 11.3 (L) 03/18/2023   HCT 35.3 (L) 03/18/2023   MCV 87.4 03/18/2023   PLT 344 03/18/2023    Lab Results  Component Value Date   CREATININE 0.76 03/18/2023   BUN 13 03/18/2023   NA 133 (L) 03/18/2023   K 3.7 03/18/2023   CL 101 03/18/2023   CO2 24 03/18/2023    Lab Results  Component Value Date   ALT 14 03/18/2023   AST 23 03/18/2023   ALKPHOS 96 03/18/2023   BILITOT 0.3 03/18/2023     Microbiology: Recent Results (from the past 240  hour(s))  Body fluid culture w Gram Stain     Status: None   Collection Time: 03/11/23  8:39 AM   Specimen: Synovium; Body Fluid  Result Value Ref Range Status   Specimen Description SYNOVIAL  Final   Special Requests LEFT KNEE  Final   Gram Stain   Final    MODERATE WBC PRESENT, PREDOMINANTLY PMN NO ORGANISMS SEEN Performed at [facility], [address] [place], [place] [address]    Culture FEW STAPHYLOCOCCUS AUREUS  Final   Report Status 03/14/2023 FINAL  Final   Organism ID, Bacteria STAPHYLOCOCCUS AUREUS  Final      Susceptibility   Staphylococcus aureus - MIC*    CIPROFLOXACIN 1 SENSITIVE Sensitive     ERYTHROMYCIN <=0.25 SENSITIVE Sensitive     GENTAMICIN <=0.5 SENSITIVE Sensitive     OXACILLIN <=0.25 SENSITIVE Sensitive     TETRACYCLINE <=1 SENSITIVE Sensitive     VANCOMYCIN <=0.5 SENSITIVE Sensitive     TRIMETH/SULFA <=10 SENSITIVE Sensitive     CLINDAMYCIN <=0.25 SENSITIVE Sensitive     RIFAMPIN <=0.5 SENSITIVE Sensitive     Inducible Clindamycin NEGATIVE Sensitive     LINEZOLID 2 SENSITIVE Sensitive     * FEW STAPHYLOCOCCUS AUREUS  Blood culture (routine x 2)     Status: Abnormal   Collection Time: 03/14/23  9:39 PM   Specimen: BLOOD  Result Value Ref Range Status   Specimen Description BLOOD RIGHT ANTECUBITAL  Final   Special Requests   Final    BOTTLES DRAWN AEROBIC AND ANAEROBIC Blood Culture results may not be optimal due to an excessive volume of blood received in culture bottles   Culture  Setup Time   Final    GRAM POSITIVE COCCI IN CLUSTERS IN BOTH AEROBIC AND ANAEROBIC BOTTLES CRITICAL RESULT CALLED TO, READ BACK BY AND VERIFIED WITH: PHARMD [id number] TO SAVANNAH MOODY, ADC Performed at [facility], [address], [place], [place] [address]    Culture STAPHYLOCOCCUS AUREUS (A)  Final   Report Status 03/17/2023 FINAL  Final   Organism ID, Bacteria STAPHYLOCOCCUS AUREUS  Final      Susceptibility   Staphylococcus aureus - MIC*     CIPROFLOXACIN 1 SENSITIVE Sensitive     ERYTHROMYCIN <=0.25 SENSITIVE Sensitive     GENTAMICIN <=0.5 SENSITIVE Sensitive     OXACILLIN <=0.25 SENSITIVE Sensitive     TETRACYCLINE <=1 SENSITIVE Sensitive  VANCOMYCIN <=0.5 SENSITIVE Sensitive     TRIMETH/SULFA <=10 SENSITIVE Sensitive     CLINDAMYCIN <=0.25 SENSITIVE Sensitive     RIFAMPIN <=0.5 SENSITIVE Sensitive     Inducible Clindamycin NEGATIVE Sensitive     LINEZOLID 2 SENSITIVE Sensitive     * STAPHYLOCOCCUS AUREUS  Body fluid culture w Gram Stain     Status: None   Collection Time: 03/14/23  9:39 PM   Specimen: Joint, Knee; Body Fluid  Result Value Ref Range Status   Specimen Description JOINT FLUID KNEE  Final   Special Requests NONE  Final   Gram Stain   Final    MODERATE WBC PRESENT,BOTH PMN AND MONONUCLEAR RARE GRAM POSITIVE COCCI Performed at [facility], [address], [place], [place] [address]    Culture RARE STAPHYLOCOCCUS AUREUS  Final   Report Status 03/17/2023 FINAL  Final   Organism ID, Bacteria STAPHYLOCOCCUS AUREUS  Final      Susceptibility   Staphylococcus aureus - MIC*    CIPROFLOXACIN 1 SENSITIVE Sensitive     ERYTHROMYCIN <=0.25 SENSITIVE Sensitive     GENTAMICIN <=0.5 SENSITIVE Sensitive     OXACILLIN <=0.25 SENSITIVE Sensitive     TETRACYCLINE <=1 SENSITIVE Sensitive     VANCOMYCIN <=0.5 SENSITIVE Sensitive     TRIMETH/SULFA <=10 SENSITIVE Sensitive     CLINDAMYCIN <=0.25 SENSITIVE Sensitive     RIFAMPIN <=0.5 SENSITIVE Sensitive     Inducible Clindamycin NEGATIVE Sensitive     LINEZOLID 2 SENSITIVE Sensitive     * RARE STAPHYLOCOCCUS AUREUS  Blood Culture ID Panel (Reflexed)     Status: Abnormal   Collection Time: 03/14/23  9:39 PM  Result Value Ref Range Status   Enterococcus faecalis NOT DETECTED NOT DETECTED Final   Enterococcus Faecium NOT DETECTED NOT DETECTED Final   Listeria monocytogenes NOT DETECTED NOT DETECTED Final   Staphylococcus species DETECTED (A) NOT DETECTED  Final    Comment: CRITICAL RESULT CALLED TO, READ BACK BY AND VERIFIED WITH: PHARMD SAVANNAH MOODY [id number], ADC    Staphylococcus aureus (BCID) DETECTED (A) NOT DETECTED Final    Comment: CRITICAL RESULT CALLED TO, READ BACK BY AND VERIFIED WITH: PHARMD SAVANNAH MOODY [id number] [id number], ADC    Staphylococcus epidermidis NOT DETECTED NOT DETECTED Final   Staphylococcus lugdunensis NOT DETECTED NOT DETECTED Final   Streptococcus species NOT DETECTED NOT DETECTED Final   Streptococcus agalactiae NOT DETECTED NOT DETECTED Final   Streptococcus pneumoniae NOT DETECTED NOT DETECTED Final   Streptococcus pyogenes NOT DETECTED NOT DETECTED Final   A.calcoaceticus-baumannii NOT DETECTED NOT DETECTED Final   Bacteroides fragilis NOT DETECTED NOT DETECTED Final   Enterobacterales NOT DETECTED NOT DETECTED Final   Enterobacter cloacae complex NOT DETECTED NOT DETECTED Final   Escherichia coli NOT DETECTED NOT DETECTED Final   Klebsiella aerogenes NOT DETECTED NOT DETECTED Final   Klebsiella oxytoca NOT DETECTED NOT DETECTED Final   Klebsiella pneumoniae NOT DETECTED NOT DETECTED Final   Proteus species NOT DETECTED NOT DETECTED Final   Salmonella species NOT DETECTED NOT DETECTED Final   Serratia marcescens NOT DETECTED NOT DETECTED Final   Haemophilus influenzae NOT DETECTED NOT DETECTED Final   Neisseria meningitidis NOT DETECTED NOT DETECTED Final   Pseudomonas aeruginosa NOT DETECTED NOT DETECTED Final   Stenotrophomonas maltophilia NOT DETECTED NOT DETECTED Final   Candida albicans NOT DETECTED NOT DETECTED Final   Candida auris NOT DETECTED NOT DETECTED Final   Candida glabrata NOT DETECTED NOT DETECTED Final   Candida  krusei NOT DETECTED NOT DETECTED Final   Candida parapsilosis NOT DETECTED NOT DETECTED Final   Candida tropicalis NOT DETECTED NOT DETECTED Final   Cryptococcus neoformans/gattii NOT DETECTED NOT DETECTED Final   Meth resistant mecA/C and MREJ NOT DETECTED NOT DETECTED  Final    Comment: Performed at [facility], [address], [place], [place] [address]  Blood culture (routine x 2)     Status: None (Preliminary result)   Collection Time: 03/15/23 11:08 AM   Specimen: BLOOD LEFT ARM  Result Value Ref Range Status   Specimen Description BLOOD LEFT ARM  Final   Special Requests   Final    BOTTLES DRAWN AEROBIC AND ANAEROBIC Blood Culture results may not be optimal due to an inadequate volume of blood received in culture bottles   Culture   Final    NO GROWTH 3 DAYS Performed at [facility], [address], [place], [place] [address]    Report Status PENDING  Incomplete  Surgical pcr screen     Status: Abnormal   Collection Time: 03/15/23 11:45 AM   Specimen: Nasal Mucosa; Nasal Swab  Result Value Ref Range Status   MRSA, PCR (A) NEGATIVE Corrected    INVALID, UNABLE TO DETERMINE THE PRESENCE OF TARGET DUE TO SPECIMEN INTEGRITY. RECOLLECTION REQUESTED.   Staphylococcus aureus (A) NEGATIVE Corrected    INVALID, UNABLE TO DETERMINE THE PRESENCE OF TARGET DUE TO SPECIMEN INTEGRITY. RECOLLECTION REQUESTED.    Comment: NOTIFIED RN Koleen Nimrod D ON 03/15/23 @ 1547 BY DRT Performed at [facility], [address], [place], [place] [address] CORRECTED ON 08/09 AT 1547: PREVIOUSLY REPORTED AS INVALID, UNABLE TO DETERMINE THE PRESENCE OF TARGET DUE TO SPECIMEN INTEGRITY. RECOLLECTION REQUESTED.   Aerobic/Anaerobic Culture w Gram Stain (surgical/deep wound)     Status: None (Preliminary result)   Collection Time: 03/15/23 12:48 PM   Specimen: Soft Tissue, Other  Result Value Ref Range Status   Specimen Description TISSUE  Final   Special Requests left knee  Final   Gram Stain   Final    ABUNDANT WBC PRESENT, PREDOMINANTLY PMN RARE GRAM POSITIVE COCCI IN PAIRS Performed at [facility], [address] [place], [place] [address]    Culture   Final    RARE STAPHYLOCOCCUS AUREUS NO ANAEROBES ISOLATED; CULTURE IN PROGRESS FOR 5 DAYS     Report Status PENDING  Incomplete   Organism ID, Bacteria STAPHYLOCOCCUS AUREUS  Final      Susceptibility   Staphylococcus aureus - MIC*    CIPROFLOXACIN 1 SENSITIVE Sensitive     ERYTHROMYCIN <=0.25 SENSITIVE Sensitive     GENTAMICIN <=0.5 SENSITIVE Sensitive     OXACILLIN <=0.25 SENSITIVE Sensitive     TETRACYCLINE <=1 SENSITIVE Sensitive     VANCOMYCIN <=0.5 SENSITIVE Sensitive     TRIMETH/SULFA <=10 SENSITIVE Sensitive     CLINDAMYCIN <=0.25 SENSITIVE Sensitive     RIFAMPIN <=0.5 SENSITIVE Sensitive     Inducible Clindamycin NEGATIVE Sensitive     LINEZOLID 2 SENSITIVE Sensitive     * RARE STAPHYLOCOCCUS AUREUS  Culture, blood (Routine X 2) w Reflex to ID Panel     Status: None (Preliminary result)   Collection Time: 03/17/23 11:40 AM   Specimen: BLOOD LEFT HAND  Result Value Ref Range Status   Specimen Description BLOOD LEFT HAND  Final   Special Requests   Final    BOTTLES DRAWN AEROBIC AND ANAEROBIC Blood Culture adequate volume   Culture  Final    NO GROWTH < 24 HOURS Performed at [facility], [address], [place], [place] [address]    Report Status PENDING  Incomplete  Culture, blood (Routine X 2) w Reflex to ID Panel     Status: None (Preliminary result)   Collection Time: 03/17/23 11:49 AM   Specimen: BLOOD RIGHT HAND  Result Value Ref Range Status   Specimen Description BLOOD RIGHT HAND  Final   Special Requests   Final    BOTTLES DRAWN AEROBIC AND ANAEROBIC Blood Culture adequate volume   Culture   Final    NO GROWTH < 24 HOURS Performed at [facility], [address], [place], [place] [address]    Report Status PENDING  Incomplete     Rexene Alberts, MSN, NP-C [facility]  [email or web address] Pager: [phone] Office: [phone] [facility] Main Line: [phone] *Secure Chat Communication Welcome   Total Encounter Time: 23 min

## 2023-03-18 NOTE — Progress Notes (Signed)
PROGRESS NOTE Dean Mayer  VHQ:[id number] DOB: Dec 03, 1940 DOA: 03/14/2023 PCP: Angelique Blonder, MD  Brief Narrative/Hospital Course: 82yom w/ history of gout, HTN, HLD, BPH, hypothyroidism, throat cancer, positive ANA, positive rheumatoid factor who on 7/29 saw rheumatologist with complaints of left knee pain and swelling. At that point patient's knee was tapped and sent for synovial fluid analysis.  Patient is given intra-articular steroid joint infection.  Fluid analysis shows gout, he was started on allopurinol.  On 7/31 patient recontacted rheumatologist as his pain and swelling persisted.  He was prescribed a 9-day prednisone taper. Per patient a temporal improvement then his pain worsened.  He was not getting around with a walker.  He had difficulty getting in bed, out of bed, around the house and seen  in ED 03/11/2023>repeat arthrocentesis done AND discharged with hydrocodone, was given orthopedics referral but returned to ED 8/30 tonight  as he developed nausea and vomiting, fever Tmax 100.7 and knee remained painful. 7/29 joint aspiration>75,000 WBCs, cultures grew pansensitive MSSA and was positive for monosodium urate.  one 03/11/2023 synovial fluid>26,000 WBCs rare gram-positive cocci, no crystals noted. ESR 14.1, CRP 97 In the ER,repeat tap of his left knee done. He was afebrile.Left knee remains painful with much decreased range of motion.  Orthopedics on-call Dr. Susa Simmonds consulted and  admitted for MSSA left knee septic arthritis 8/9: Incision and drainage of left septic knee Dr Jena Gauss    Subjective: Patient seen and examined Overnight patient remained afebrile BP in 150s to 170s Labs show stable CBC although low albumin stable renal function  patient seen and examined this morning Wife at the bedside earlier was having pain in his left knee and improving after taking hydrocodone Overnight patient has been afebrile, BP stable on room air Labs with a stable CBC CMP with element  1.6 Remains in pain control with Vicodin, Tylenol has not needed IV Dilaudid since 8/9  Assessment and Plan: Principal Problem:   Bacterial infection of knee joint (HCC)  MSSA sepsis MSSA septic arthritis left knee Left knee pain x 3 to 4 weeks Initially suspected left acute flare up, workup showed MSSA sepsis secondary to MSSA septic arthritis Seen by ortho 8/9:I&D of left knee (Dr Jena Gauss).Synovial fluid 8/5- MSSA. and culture from OR  8/9> MSSA.Blood culture 8/8 with MSS ,repeated 03/15/23>NGTD. Appreciate ID inputs on board> need to eval other nidus of infection,TTE results-EF 65 to 70%, no RWMA, mitral valve normal no MR aortic valve indeterminate number of course no AR - ?TEE need- defer to ID.  Will need PICC placed, defer to  ID for timing MRI thoracic and lumbar spine w/o 8/10: no acute finding. Remains on Ancef 2 g q8hr, continue IV cefazolin 2 g q 8  and will likely need at least 4 weeks of antibiotics from day of clearance of bacteremia possibly longer.  ?Acute gout flareup: continue allopurinol, s/p colchicine. cont pain control.  Try to avoid steroids initially given infection process  Elevated LFTs W/ Hypoalbuminemia, new: likely from sepsis.Resolved.   Hyperglycemia/Prediabeteds (new): A1c 5.9.monitor   HTN: BP controlled, continue home amlodipine 10 and lisinopril 5.    HLD: Cont atorvastatin  Normocytic anemia mild: Monitor hb as below. Recent Labs  Lab 03/14/23 2155 03/15/23 0359 03/16/23 0112 03/17/23 0154 03/18/23 0014  HGB 12.7* 12.2* 11.5* 11.4* 11.3*  HCT 39.5 37.3* 34.8* 36.0* 35.3*     BPH: Cont Flomax    Hypothyroidism Cont synthroid   History of throat cancer   Cataracts Cont  eye drops  DVT prophylaxis: SCDs Start: 03/15/23 1439 asa 325 mg daily Code Status:   Code Status: DNR Family Communication: plan of care discussed with patient at bedside. Patient status is: Inpatient because of septic knee Level of care: Med-Surg   Dispo: The  patient is from: home            Anticipated disposition: Pending clinical improvement.  Setting of home IV antibiotic Objective: Vitals last 24 hrs: Vitals:   03/17/23 2100 03/18/23 0524 03/18/23 0816 03/18/23 1006  BP: (!) 157/61 (!) 164/59 (!) 170/65 (!) 165/59  Pulse: 78 74 80   Resp: 20 18 17    Temp: 98.9 F (37.2 C) 98.6 F (37 C) 98.4 F (36.9 C)   TempSrc: Oral Oral Oral   SpO2: 96% 96% 93%   Weight:      Height:       Weight change:   Physical Examination: General exam: alert awake, oriented at baseline, older than stated age HEENT:Oral mucosa moist, Ear/Nose WNL grossly Respiratory system: Bilaterally clear BS,no use of accessory muscle Cardiovascular system: S1 & S2 +, No JVD. Gastrointestinal system: Abdomen soft,NT,ND, BS+ Nervous System: Alert, awake, moving all extremities, and following commands. Extremities: LE edema neg, Lt knee dressing intact w/ drain+ Skin: No rashes,no icterus. MSK: Normal muscle bulk,tone, power   Medications reviewed:  Scheduled Meds:  allopurinol  100 mg Oral Daily   amLODipine  10 mg Oral Daily   ascorbic acid  1,000 mg Oral Daily   aspirin EC  325 mg Oral Daily   atorvastatin  40 mg Oral QPM   brimonidine  1 drop Right Eye BID   docusate sodium  100 mg Oral BID   ferrous sulfate   Oral Daily   levothyroxine  100 mcg Oral Daily   lisinopril  5 mg Oral BID   omega-3 acid ethyl esters  1 g Oral Daily   tamsulosin  0.4 mg Oral Daily  Continuous Infusions:  sodium chloride 75 mL/hr at 03/18/23 0526    ceFAZolin (ANCEF) IV 2 g (03/18/23 0528)   methocarbamol (ROBAXIN) IV      Diet Order             Diet Heart Room service appropriate? Yes; Fluid consistency: Thin  Diet effective now                  Intake/Output Summary (Last 24 hours) at 03/18/2023 1054 Last data filed at 03/18/2023 1018 Gross per 24 hour  Intake 480 ml  Output 2795 ml  Net -2315 ml   Net IO Since Admission: -2,507.52 mL [03/18/23 1054]  Wt  Readings from Last 3 Encounters:  03/15/23 108 kg  03/11/23 108 kg  02/20/23 108 kg     Unresulted Labs (From admission, onward)     Start     Ordered   03/22/23 0500  Creatinine, serum  (enoxaparin (LOVENOX)    CrCl >/= 30 ml/min)  Weekly,   R     Comments: while on enoxaparin therapy    03/15/23 0130   03/16/23 0500  CBC  Daily,   R      03/15/23 1049   03/16/23 0500  Comprehensive metabolic panel  Daily,   R      03/15/23 1049   03/15/23 1248  Fungus Culture With Stain  RELEASE UPON ORDERING,   TIMED       Comments: Specimen A: Pre-op diagnosis: SEPTIC JOINT    03/15/23 1248   03/15/23  0500  CBC with Differential/Platelet  Tomorrow morning,   R        03/15/23 0130          Data Reviewed: I have personally reviewed following labs and imaging studies CBC: Recent Labs  Lab 03/14/23 2155 03/15/23 0359 03/16/23 0112 03/17/23 0154 03/18/23 0014  WBC 9.5 8.6 9.6 8.7 7.0  NEUTROABS  --  7.1  --   --   --   HGB 12.7* 12.2* 11.5* 11.4* 11.3*  HCT 39.5 37.3* 34.8* 36.0* 35.3*  MCV 87.4 87.1 87.7 90.5 87.4  PLT 369 357 335 376 344   Basic Metabolic Panel: Recent Labs  Lab 03/14/23 2155 03/15/23 0302 03/16/23 0112 03/17/23 1216 03/18/23 0014  NA 137 134* 132* 136 133*  K 4.2 3.8 4.1 3.6 3.7  CL 100 98 98 100 101  CO2 27 25 23 24 24   GLUCOSE 155* 141* 175* 158* 115*  BUN 23 21 18 12 13   CREATININE 0.86 0.77 0.90 0.78 0.76  CALCIUM 9.0 8.4* 8.3* 8.3* 8.1*   GFR: Estimated Creatinine Clearance: 99.3 mL/min (by C-G formula based on SCr of 0.76 mg/dL). Liver Function Tests: Recent Labs  Lab 03/14/23 2155 03/15/23 0302 03/16/23 0112 03/17/23 1216 03/18/23 0014  AST 52* 38 22 26 23   ALT 62* 53* 31 19 14   ALKPHOS 138* 120 105 101 96  BILITOT 0.5 0.7 0.2* 0.2* 0.3  PROT 5.7* 5.8* 5.2* 5.1* 5.2*  ALBUMIN 1.9* 1.9* 1.6* 1.7* 1.7*   Recent Labs  Lab 03/15/23 0302  INR 1.1   Recent Results (from the past 240 hour(s))  Body fluid culture w Gram Stain      Status: None   Collection Time: 03/11/23  8:39 AM   Specimen: Synovium; Body Fluid  Result Value Ref Range Status   Specimen Description SYNOVIAL  Final   Special Requests LEFT KNEE  Final   Gram Stain   Final    MODERATE WBC PRESENT, PREDOMINANTLY PMN NO ORGANISMS SEEN Performed at [facility], [address] [place], [place] [address]    Culture FEW STAPHYLOCOCCUS AUREUS  Final   Report Status 03/14/2023 FINAL  Final   Organism ID, Bacteria STAPHYLOCOCCUS AUREUS  Final      Susceptibility   Staphylococcus aureus - MIC*    CIPROFLOXACIN 1 SENSITIVE Sensitive     ERYTHROMYCIN <=0.25 SENSITIVE Sensitive     GENTAMICIN <=0.5 SENSITIVE Sensitive     OXACILLIN <=0.25 SENSITIVE Sensitive     TETRACYCLINE <=1 SENSITIVE Sensitive     VANCOMYCIN <=0.5 SENSITIVE Sensitive     TRIMETH/SULFA <=10 SENSITIVE Sensitive     CLINDAMYCIN <=0.25 SENSITIVE Sensitive     RIFAMPIN <=0.5 SENSITIVE Sensitive     Inducible Clindamycin NEGATIVE Sensitive     LINEZOLID 2 SENSITIVE Sensitive     * FEW STAPHYLOCOCCUS AUREUS  Blood culture (routine x 2)     Status: Abnormal   Collection Time: 03/14/23  9:39 PM   Specimen: BLOOD  Result Value Ref Range Status   Specimen Description BLOOD RIGHT ANTECUBITAL  Final   Special Requests   Final    BOTTLES DRAWN AEROBIC AND ANAEROBIC Blood Culture results may not be optimal due to an excessive volume of blood received in culture bottles   Culture  Setup Time   Final    GRAM POSITIVE COCCI IN CLUSTERS IN BOTH AEROBIC AND ANAEROBIC BOTTLES CRITICAL RESULT CALLED TO, READ BACK BY AND VERIFIED WITH: PHARMD [id number] TO SAVANNAH MOODY,  ADC Performed at [facility] Lab, [address], [place], [place] [address]    Culture STAPHYLOCOCCUS AUREUS (A)  Final   Report Status 03/17/2023 FINAL  Final   Organism ID, Bacteria STAPHYLOCOCCUS AUREUS  Final      Susceptibility   Staphylococcus aureus - MIC*    CIPROFLOXACIN 1 SENSITIVE Sensitive      ERYTHROMYCIN <=0.25 SENSITIVE Sensitive     GENTAMICIN <=0.5 SENSITIVE Sensitive     OXACILLIN <=0.25 SENSITIVE Sensitive     TETRACYCLINE <=1 SENSITIVE Sensitive     VANCOMYCIN <=0.5 SENSITIVE Sensitive     TRIMETH/SULFA <=10 SENSITIVE Sensitive     CLINDAMYCIN <=0.25 SENSITIVE Sensitive     RIFAMPIN <=0.5 SENSITIVE Sensitive     Inducible Clindamycin NEGATIVE Sensitive     LINEZOLID 2 SENSITIVE Sensitive     * STAPHYLOCOCCUS AUREUS  Body fluid culture w Gram Stain     Status: None   Collection Time: 03/14/23  9:39 PM   Specimen: Joint, Knee; Body Fluid  Result Value Ref Range Status   Specimen Description JOINT FLUID KNEE  Final   Special Requests NONE  Final   Gram Stain   Final    MODERATE WBC PRESENT,BOTH PMN AND MONONUCLEAR RARE GRAM POSITIVE COCCI Performed at [facility], [address], [place], [place] [address]    Culture RARE STAPHYLOCOCCUS AUREUS  Final   Report Status 03/17/2023 FINAL  Final   Organism ID, Bacteria STAPHYLOCOCCUS AUREUS  Final      Susceptibility   Staphylococcus aureus - MIC*    CIPROFLOXACIN 1 SENSITIVE Sensitive     ERYTHROMYCIN <=0.25 SENSITIVE Sensitive     GENTAMICIN <=0.5 SENSITIVE Sensitive     OXACILLIN <=0.25 SENSITIVE Sensitive     TETRACYCLINE <=1 SENSITIVE Sensitive     VANCOMYCIN <=0.5 SENSITIVE Sensitive     TRIMETH/SULFA <=10 SENSITIVE Sensitive     CLINDAMYCIN <=0.25 SENSITIVE Sensitive     RIFAMPIN <=0.5 SENSITIVE Sensitive     Inducible Clindamycin NEGATIVE Sensitive     LINEZOLID 2 SENSITIVE Sensitive     * RARE STAPHYLOCOCCUS AUREUS  Blood Culture ID Panel (Reflexed)     Status: Abnormal   Collection Time: 03/14/23  9:39 PM  Result Value Ref Range Status   Enterococcus faecalis NOT DETECTED NOT DETECTED Final   Enterococcus Faecium NOT DETECTED NOT DETECTED Final   Listeria monocytogenes NOT DETECTED NOT DETECTED Final   Staphylococcus species DETECTED (A) NOT DETECTED Final    Comment: CRITICAL RESULT CALLED TO,  READ BACK BY AND VERIFIED WITH: PHARMD SAVANNAH MOODY [id number], ADC    Staphylococcus aureus (BCID) DETECTED (A) NOT DETECTED Final    Comment: CRITICAL RESULT CALLED TO, READ BACK BY AND VERIFIED WITH: PHARMD SAVANNAH MOODY [id number], ADC    Staphylococcus epidermidis NOT DETECTED NOT DETECTED Final   Staphylococcus lugdunensis NOT DETECTED NOT DETECTED Final   Streptococcus species NOT DETECTED NOT DETECTED Final   Streptococcus agalactiae NOT DETECTED NOT DETECTED Final   Streptococcus pneumoniae NOT DETECTED NOT DETECTED Final   Streptococcus pyogenes NOT DETECTED NOT DETECTED Final   A.calcoaceticus-baumannii NOT DETECTED NOT DETECTED Final   Bacteroides fragilis NOT DETECTED NOT DETECTED Final   Enterobacterales NOT DETECTED NOT DETECTED Final   Enterobacter cloacae complex NOT DETECTED NOT DETECTED Final   Escherichia coli NOT DETECTED NOT DETECTED Final   Klebsiella aerogenes NOT DETECTED NOT DETECTED Final   Klebsiella oxytoca NOT DETECTED NOT DETECTED Final   Klebsiella pneumoniae NOT DETECTED  NOT DETECTED Final   Proteus species NOT DETECTED NOT DETECTED Final   Salmonella species NOT DETECTED NOT DETECTED Final   Serratia marcescens NOT DETECTED NOT DETECTED Final   Haemophilus influenzae NOT DETECTED NOT DETECTED Final   Neisseria meningitidis NOT DETECTED NOT DETECTED Final   Pseudomonas aeruginosa NOT DETECTED NOT DETECTED Final   Stenotrophomonas maltophilia NOT DETECTED NOT DETECTED Final   Candida albicans NOT DETECTED NOT DETECTED Final   Candida auris NOT DETECTED NOT DETECTED Final   Candida glabrata NOT DETECTED NOT DETECTED Final   Candida krusei NOT DETECTED NOT DETECTED Final   Candida parapsilosis NOT DETECTED NOT DETECTED Final   Candida tropicalis NOT DETECTED NOT DETECTED Final   Cryptococcus neoformans/gattii NOT DETECTED NOT DETECTED Final   Meth resistant mecA/C and MREJ NOT DETECTED NOT DETECTED Final    Comment: Performed at [facility] Lab, [address], [place], [place] [address]  Blood culture (routine x 2)     Status: None (Preliminary result)   Collection Time: 03/15/23 11:08 AM   Specimen: BLOOD LEFT ARM  Result Value Ref Range Status   Specimen Description BLOOD LEFT ARM  Final   Special Requests   Final    BOTTLES DRAWN AEROBIC AND ANAEROBIC Blood Culture results may not be optimal due to an inadequate volume of blood received in culture bottles   Culture   Final    NO GROWTH 3 DAYS Performed at [facility], [address], [place], [place] [address]    Report Status PENDING  Incomplete  Surgical pcr screen     Status: Abnormal   Collection Time: 03/15/23 11:45 AM   Specimen: Nasal Mucosa; Nasal Swab  Result Value Ref Range Status   MRSA, PCR (A) NEGATIVE Corrected    INVALID, UNABLE TO DETERMINE THE PRESENCE OF TARGET DUE TO SPECIMEN INTEGRITY. RECOLLECTION REQUESTED.   Staphylococcus aureus (A) NEGATIVE Corrected    INVALID, UNABLE TO DETERMINE THE PRESENCE OF TARGET DUE TO SPECIMEN INTEGRITY. RECOLLECTION REQUESTED.    Comment: NOTIFIED RN Koleen Nimrod D ON 03/15/23 @ 1547 BY DRT Performed at [facility], [address] [place], [place] [address] CORRECTED ON 08/09 AT 1547: PREVIOUSLY REPORTED AS INVALID, UNABLE TO DETERMINE THE PRESENCE OF TARGET DUE TO SPECIMEN INTEGRITY. RECOLLECTION REQUESTED.   Aerobic/Anaerobic Culture w Gram Stain (surgical/deep wound)     Status: None (Preliminary result)   Collection Time: 03/15/23 12:48 PM   Specimen: Soft Tissue, Other  Result Value Ref Range Status   Specimen Description TISSUE  Final   Special Requests left knee  Final   Gram Stain   Final    ABUNDANT WBC PRESENT, PREDOMINANTLY PMN RARE GRAM POSITIVE COCCI IN PAIRS Performed at [facility], [address], [place], [place] [address]    Culture   Final    RARE STAPHYLOCOCCUS AUREUS NO ANAEROBES ISOLATED; CULTURE IN PROGRESS FOR 5 DAYS    Report Status PENDING  Incomplete   Organism  ID, Bacteria STAPHYLOCOCCUS AUREUS  Final      Susceptibility   Staphylococcus aureus - MIC*    CIPROFLOXACIN 1 SENSITIVE Sensitive     ERYTHROMYCIN <=0.25 SENSITIVE Sensitive     GENTAMICIN <=0.5 SENSITIVE Sensitive     OXACILLIN <=0.25 SENSITIVE Sensitive     TETRACYCLINE <=1 SENSITIVE Sensitive     VANCOMYCIN <=0.5 SENSITIVE Sensitive     TRIMETH/SULFA <=10 SENSITIVE Sensitive     CLINDAMYCIN <=0.25 SENSITIVE Sensitive     RIFAMPIN <=  0.5 SENSITIVE Sensitive     Inducible Clindamycin NEGATIVE Sensitive     LINEZOLID 2 SENSITIVE Sensitive     * RARE STAPHYLOCOCCUS AUREUS  Culture, blood (Routine X 2) w Reflex to ID Panel     Status: None (Preliminary result)   Collection Time: 03/17/23 11:40 AM   Specimen: BLOOD LEFT HAND  Result Value Ref Range Status   Specimen Description BLOOD LEFT HAND  Final   Special Requests   Final    BOTTLES DRAWN AEROBIC AND ANAEROBIC Blood Culture adequate volume   Culture   Final    NO GROWTH < 24 HOURS Performed at [facility], [address], [place], [place] [address]    Report Status PENDING  Incomplete  Culture, blood (Routine X 2) w Reflex to ID Panel     Status: None (Preliminary result)   Collection Time: 03/17/23 11:49 AM   Specimen: BLOOD RIGHT HAND  Result Value Ref Range Status   Specimen Description BLOOD RIGHT HAND  Final   Special Requests   Final    BOTTLES DRAWN AEROBIC AND ANAEROBIC Blood Culture adequate volume   Culture   Final    NO GROWTH < 24 HOURS Performed at [facility], [address], [place], [place] [address]    Report Status PENDING  Incomplete    Antimicrobials: Anti-infectives (From admission, onward)    Start     Dose/Rate Route Frequency Ordered Stop   03/15/23 1545  ceFAZolin (ANCEF) IVPB 2g/100 mL premix        2 g 200 mL/hr over 30 Minutes Intravenous Every 8 hours 03/15/23 1454     03/15/23 1220  vancomycin (VANCOCIN) powder  Status:  Discontinued          As needed 03/15/23 1221  03/15/23 1319   03/15/23 1145  ceFAZolin (ANCEF) IVPB 2g/100 mL premix        2 g 200 mL/hr over 30 Minutes Intravenous On call to O.R. 03/15/23 1134 03/15/23 1253   03/15/23 0600  ceFAZolin (ANCEF) IVPB 2g/100 mL premix  Status:  Discontinued        2 g 200 mL/hr over 30 Minutes Intravenous Every 8 hours 03/15/23 0159 03/15/23 1438   03/14/23 2245  ceFAZolin (ANCEF) IVPB 2g/100 mL premix        2 g 200 mL/hr over 30 Minutes Intravenous  Once 03/14/23 2234 03/15/23 0030      Culture/Microbiology    Component Value Date/Time   SDES BLOOD RIGHT HAND 03/17/2023 1149   SPECREQUEST  03/17/2023 1149    BOTTLES DRAWN AEROBIC AND ANAEROBIC Blood Culture adequate volume   CULT  03/17/2023 1149    NO GROWTH < 24 HOURS Performed at [facility] Lab, [address], [place], [place] [address]    REPTSTATUS PENDING 03/17/2023 1149    Radiology Studies: ECHOCARDIOGRAM COMPLETE  Result Date: 03/17/2023    ECHOCARDIOGRAM REPORT   Patient Name:   LEDION PIA Date of Exam: 03/17/2023 Medical Rec #:  [id number]         Height:       76.0 in Accession #:    [id number]        Weight:       238.1 lb Date of Birth:  1940/12/14         BSA:          2.386 m Patient Age:    36 years  BP:           144/67 mmHg Patient Gender: M                 HR:           72 bpm. Exam Location:  Inpatient Procedure: 2D Echo, Cardiac Doppler and Color Doppler Indications:    murmur  History:        Patient has prior history of Echocardiogram examinations, most                 recent 12/02/2022. Risk Factors:Hypertension and Dyslipidemia.  Sonographer:    Delcie Roch RDCS Referring Phys: [id number]    Sonographer Comments: Could not turn on side due to recent surgery. IMPRESSIONS  1. Left ventricular ejection fraction, by estimation, is 65 to 70%. The left ventricle has normal function. The left ventricle has no regional wall motion abnormalities. There is mild concentric left ventricular hypertrophy. Left  ventricular diastolic parameters are indeterminate.  2. Right ventricular systolic function is normal. The right ventricular size is normal. Tricuspid regurgitation signal is inadequate for assessing PA pressure.  3. The mitral valve is normal in structure. No evidence of mitral valve regurgitation. No evidence of mitral stenosis.  4. The aortic valve has an indeterminant number of cusps. Aortic valve regurgitation is not visualized. No aortic stenosis is present.  5. There is mild dilatation of the aortic root, measuring 39 mm.  6. The inferior vena cava is normal in size with greater than 50% respiratory variability, suggesting right atrial pressure of 3 mmHg. FINDINGS  Left Ventricle: Left ventricular ejection fraction, by estimation, is 65 to 70%. The left ventricle has normal function. The left ventricle has no regional wall motion abnormalities. The left ventricular internal cavity size was normal in size. There is  mild concentric left ventricular hypertrophy. Left ventricular diastolic parameters are indeterminate. Right Ventricle: The right ventricular size is normal. No increase in right ventricular wall thickness. Right ventricular systolic function is normal. Tricuspid regurgitation signal is inadequate for assessing PA pressure. Left Atrium: Left atrial size was normal in size. Right Atrium: Right atrial size was normal in size. Pericardium: There is no evidence of pericardial effusion. Presence of epicardial fat layer. Mitral Valve: The mitral valve is normal in structure. No evidence of mitral valve regurgitation. No evidence of mitral valve stenosis. Tricuspid Valve: The tricuspid valve is normal in structure. Tricuspid valve regurgitation is not demonstrated. No evidence of tricuspid stenosis. Aortic Valve: The aortic valve has an indeterminant number of cusps. Aortic valve regurgitation is not visualized. No aortic stenosis is present. Pulmonic Valve: The pulmonic valve was not well visualized.  Pulmonic valve regurgitation is not visualized. No evidence of pulmonic stenosis. Aorta: There is mild dilatation of the aortic root, measuring 39 mm. Venous: The inferior vena cava is normal in size with greater than 50% respiratory variability, suggesting right atrial pressure of 3 mmHg. IAS/Shunts: No atrial level shunt detected by color flow Doppler.  LEFT VENTRICLE PLAX 2D LVIDd:         4.70 cm   Diastology LVIDs:         2.00 cm   LV e' medial:    7.72 cm/s LV PW:         1.10 cm   LV E/e' medial:  11.1 LV IVS:        1.10 cm   LV e' lateral:   10.00 cm/s LVOT diam:     1.90 cm  LV E/e' lateral: 8.6 LV SV:         77 LV SV Index:   32 LVOT Area:     2.84 cm  RIGHT VENTRICLE             IVC RV Basal diam:  3.20 cm     IVC diam: 2.00 cm RV S prime:     16.90 cm/s TAPSE (M-mode): 1.7 cm LEFT ATRIUM           Index        RIGHT ATRIUM           Index LA diam:      3.80 cm 1.59 cm/m   RA Area:     12.70 cm LA Vol (A4C): 51.4 ml 21.55 ml/m  RA Volume:   27.50 ml  11.53 ml/m  AORTIC VALVE LVOT Vmax:   136.00 cm/s LVOT Vmean:  90.500 cm/s LVOT VTI:    0.272 m  AORTA Ao Root diam: 3.90 cm MITRAL VALVE MV Area (PHT): 3.03 cm    SHUNTS MV Decel Time: 250 msec    Systemic VTI:  0.27 m MV E velocity: 85.90 cm/s  Systemic Diam: 1.90 cm MV A velocity: 81.50 cm/s MV E/A ratio:  1.05 Kardie Tobb DO Electronically signed by Thomasene Ripple DO Signature Date/Time: 03/17/2023/11:08:35 AM    Final    MR THORACIC SPINE WO CONTRAST  Result Date: 03/16/2023 CLINICAL DATA:  Mid back pain with infection suspected EXAM: MRI THORACIC AND LUMBAR SPINE WITHOUT CONTRAST TECHNIQUE: Multiplanar and multiecho pulse sequences of the thoracic and lumbar spine were obtained without intravenous contrast. COMPARISON:  None Available. FINDINGS: MRI THORACIC SPINE FINDINGS Alignment:  Physiologic. Vertebrae: No fracture, evidence of discitis, or bone lesion. Schmorl's nodes at the opposing endplates at T7-8 and at the superior endplates of T9  and T10. T10 hemangioma. Cord:  Normal Paraspinal and other soft tissues: Normal Disc levels: No spinal canal stenosis.  No neural impingement. MRI LUMBAR SPINE FINDINGS Segmentation:  Standard. Alignment:  Physiologic. Vertebrae:  No fracture, evidence of discitis, or bone lesion. Conus medullaris and cauda equina: Conus extends to the L1 level. Conus and cauda equina appear normal. Paraspinal and other soft tissues: Negative Disc levels: L1-L2: Normal disc space and facet joints. No spinal canal stenosis. No neural foraminal stenosis. L2-L3: Normal disc space and facet joints. No spinal canal stenosis. No neural foraminal stenosis. L3-L4: Disc space narrowing with intermediate sized right asymmetric bulge. Right lateral recess narrowing without central spinal canal stenosis. Mild right neural foraminal stenosis. L4-L5: Intermediate sized disc bulge with mild facet hypertrophy. Left lateral recess narrowing without central spinal canal stenosis. Moderate left neural foraminal stenosis. L5-S1: Left asymmetric disc bulge with moderate facet hypertrophy. No spinal canal stenosis. Mild right and moderate left neural foraminal stenosis. Visualized sacrum: Normal. IMPRESSION: 1. No acute abnormality of the thoracic or lumbar spine. 2. 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 3. Right lateral recess narrowing at L3-4 and left lateral recess narrowing at L4-5. Correlate for right L4 and/or left L5 radiculopathy. 4. 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 Electronically Signed   By: Deatra Robinson M.D.   On: 03/16/2023 21:36   MR LUMBAR SPINE WO CONTRAST  Result Date: 03/16/2023 CLINICAL DATA:  Mid back pain with infection suspected EXAM: MRI THORACIC AND LUMBAR SPINE WITHOUT CONTRAST TECHNIQUE: Multiplanar and multiecho pulse sequences of the thoracic and lumbar spine were obtained without intravenous contrast. COMPARISON:  None Available. FINDINGS: MRI  THORACIC SPINE FINDINGS Alignment:  Physiologic. Vertebrae: No fracture, evidence of discitis, or bone lesion. Schmorl's nodes at the opposing endplates at T7-8 and at the superior endplates of T9 and T10. T10 hemangioma. Cord:  Normal Paraspinal and other soft tissues: Normal Disc levels: No spinal canal stenosis.  No neural impingement. MRI LUMBAR SPINE FINDINGS Segmentation:  Standard. Alignment:  Physiologic. Vertebrae:  No fracture, evidence of discitis, or bone lesion. Conus medullaris and cauda equina: Conus extends to the L1 level. Conus and cauda equina appear normal. Paraspinal and other soft tissues: Negative Disc levels: L1-L2: Normal disc space and facet joints. No spinal canal stenosis. No neural foraminal stenosis. L2-L3: Normal disc space and facet joints. No spinal canal stenosis. No neural foraminal stenosis. L3-L4: Disc space narrowing with intermediate sized right asymmetric bulge. Right lateral recess narrowing without central spinal canal stenosis. Mild right neural foraminal stenosis. L4-L5: Intermediate sized disc bulge with mild facet hypertrophy. Left lateral recess narrowing without central spinal canal stenosis. Moderate left neural foraminal stenosis. L5-S1: Left asymmetric disc bulge with moderate facet hypertrophy. No spinal canal stenosis. Mild right and moderate left neural foraminal stenosis. Visualized sacrum: Normal. IMPRESSION: 1. No acute abnormality of the thoracic or lumbar spine. 2. 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 3. Right lateral recess narrowing at L3-4 and left lateral recess narrowing at L4-5. Correlate for right L4 and/or left L5 radiculopathy. 4. 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 Electronically Signed   By: Deatra Robinson M.D.   On: 03/16/2023 21:36     LOS: 3 days   Lanae Boast, MD [facility]  03/18/2023, 10:54 AM

## 2023-03-18 NOTE — Care Management Important Message (Signed)
Important Message  Patient Details  Name: Dean Mayer MRN: [id number] Date of Birth: 1940/08/15   Medicare Important Message Given:  Yes     Sherilyn Banker 03/18/2023, 2:24 PM

## 2023-03-18 NOTE — TOC Initial Note (Addendum)
Transition of Care (TOC) - Initial/Assessment Note   Spoke to patient at bedside. Confirmed face sheet information.   PCP Harlan Stains Cordial   Patient from home with wife. Has walker at home.   Discussed plan for IV ABX at home.   Explained prior to discharge Pam with [employer] ) will provide education to him and his wife. He will have a home health nurse however home health RM will not be present everytime a dose is due. Patient voiced understanding.  No preference for [facility]   Cory with [facility] accepted referral for [facility] and PT , will need orders   Ordered tub seat and 3 in 1 with Jermaine with [employer]  Patient Details  Name: Dean Mayer MRN: [id number] Date of Birth: 26-Jun-1941  Transition of Care [facility]) CM/SW Contact:    Kingsley Plan, RN Phone Number: 03/18/2023, 10:39 AM  Clinical Narrative:                   Expected Discharge Plan: Home w Home Health Services Barriers to Discharge: Continued Medical Work up   Patient Goals and CMS Choice Patient states their goals for this hospitalization and ongoing recovery are:: to return to home CMS Medicare.gov Compare Post Acute Care list provided to:: Patient Choice offered to / list presented to : Patient [place] ownership interest in [facility].provided to:: Patient    Expected Discharge Plan and Services   Discharge Planning Services: CM Consult Post Acute Care Choice: Home Health Living arrangements for the past 2 months: Single Family Home                 DME Arranged: N/A         HH Arranged: RN          Prior Living Arrangements/Services Living arrangements for the past 2 months: Single Family Home Lives with:: Spouse Patient language and need for interpreter reviewed:: Yes Do you feel safe going back to the place where you live?: Yes      Need for Family Participation in Patient Care: Yes (Comment) Care giver support system in place?: Yes (comment) Current home  services: DME Criminal Activity/Legal Involvement Pertinent to Current Situation/Hospitalization: No - Comment as needed  Activities of Daily Living Home Assistive Devices/Equipment: Environmental consultant (specify type), Hearing aid, Eyeglasses ADL Screening (condition at time of admission) Patient's cognitive ability adequate to safely complete daily activities?: Yes Is the patient deaf or have difficulty hearing?: Yes Does the patient have difficulty seeing, even when wearing glasses/contacts?: No Does the patient have difficulty concentrating, remembering, or making decisions?: No Patient able to express need for assistance with ADLs?: Yes Does the patient have difficulty dressing or bathing?: No Independently performs ADLs?: Yes (appropriate for developmental age) Does the patient have difficulty walking or climbing stairs?: Yes Weakness of Legs: Left Weakness of Arms/Hands: None  Permission Sought/Granted   Permission granted to share information with : No              Emotional Assessment Appearance:: Appears stated age Attitude/Demeanor/Rapport: Engaged Affect (typically observed): Accepting Orientation: : Oriented to Self, Oriented to Place, Oriented to  Time, Oriented to Situation Alcohol / Substance Use: Not Applicable Psych Involvement: No (comment)  Admission diagnosis:  Bacterial infection of knee joint (HCC) [M00.869] Patient Active Problem List   Diagnosis Date Noted   Bacterial infection of knee joint (HCC) 03/15/2023   Swelling of right foot 02/20/2023   Sedimentation rate elevation 02/20/2023  Positive ANA (antinuclear antibody) 02/20/2023   Peripheral arterial disease (HCC) 12/04/2022   Cellulitis of right lower extremity 12/03/2022   Peripheral edema 12/02/2022   Cellulitis of right leg 12/01/2022   Hypertension 12/01/2022   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   Dyspnea on exertion 12/01/2022   Hypothyroidism 12/01/2022   Carotid stenosis 08/29/2021   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   Malignant neoplasm of prostate (HCC) 05/28/2018   Primary open angle glaucoma (POAG) of right eye, severe stage 09/16/2017   Lacunar infarct, acute (HCC) 01/05/2016   Confusion 01/04/2016   Hypertensive urgency 01/04/2016   Left-sided weakness 01/04/2016   Stroke (HCC) 01/04/2016   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   Basal cell carcinoma of left eyelid 05/09/2015   Carcinoma of left eyelid 04/25/2015   Acne rosacea 07/26/2014   Eyelid lesion 07/26/2014   Primary open angle glaucoma of both eyes 02/23/2013   Visual field defect 02/23/2013   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   PCP:  Angelique Blonder, MD Pharmacy:   [facility] - [place], [place] - [address] [place] [address] Phone: [phone] Fax: [phone]  [id number] - [place], [place] - [address] [place] [address] Phone: [phone] Fax: [phone]  [facility] [address] [place] [address] Phone: [phone] Fax: [phone]     Social Determinants of Health (SDOH) Social History: SDOH Screenings   Food Insecurity: No Food Insecurity (12/01/2022)  Housing: Low Risk  (12/01/2022)  Transportation Needs: No Transportation Needs (12/01/2022)  Utilities: Not At Risk (12/01/2022)  Financial Resource Strain: Low Risk  (11/19/2021)   Received from [facility]  Physical Activity: Inactive (11/19/2021)   Received from [facility], [facility]  Social Connections: Unknown (11/26/2022)   Received from [facility], [facility]  Stress: No Stress Concern Present (03/16/2022)   Received from [facility], [facility]  Tobacco Use: Medium Risk (03/15/2023)   SDOH Interventions:     Readmission Risk Interventions     No data to display

## 2023-03-18 NOTE — Progress Notes (Signed)
ORTHOPAEDIC PROGRESS NOTE   s/p Procedure(s): IRRIGATION AND DEBRIDEMENT LEFT KNEE on 8/9 with Dr. Jena Gauss  SUBJECTIVE: Reports mild to moderate pain about operative site. Progressing well with therapies. Echo performed yesterday  No chest pain. No SOB. No nausea/vomiting. No other complaints.  OBJECTIVE: PE: General: sitting up in hospital bed, NAD LLE: dressing CDI, hemovac in place with < 50cc of serosanguinous drainage. Compartments soft and compressible, intact EHL/TA/GSC, warm well perfused foot   Vitals:   03/18/23 0524 03/18/23 0816  BP: (!) 164/59 (!) 170/65  Pulse: 74 80  Resp: 18 17  Temp: 98.6 F (37 C) 98.4 F (36.9 C)  SpO2: 96% 93%   Blood cultures showing Staph aureus Cultures from surgery Staph aureus that is pansensitive  ASSESSMENT: Dean Mayer is a 82 y.o. male POD#3  PLAN: ID: appreciate ID consultation and recommendations.  Weightbearing: WBAT LLE Insicional and dressing care: Reinforce dressings as needed Hemovac to remain in place until discharge. Will plan to have nursing remove prior to discharge.  Orthopedic device(s): None Showering: Hold for now VTE prophylaxis: Aspirin Pain control: PRN pain medications, minimize narcotics as able Follow - up plan: 2 weeks in office with Dr. Jena Gauss Dispo: Ok for d/c from ortho standpoint once cleared by medicine team and ID. Will have nursing remove hemovac prior to discharge  Contact information: After hours and holidays please check [email or web address] for group call information for [facility]   Thompson Caul PA-C [facility] [phone] (office) [email or web address]

## 2023-03-18 NOTE — Progress Notes (Signed)
Physical Therapy Treatment Patient Details Name: Dean Mayer MRN: [id number] DOB: Jun 03, 1941 Today's Date: 03/18/2023   History of Present Illness 82 y.o. male presenting with 2 week hx of L knee pain, found to have a septic L knee. He is now s/p I&D on 03/14/23 (day of admission). PMH of HTN, Throat and prostate CA.    PT Comments  Pt had been sitting up in recliner for 2 hours. Started session with seated exercises to improve L knee ROM for coming to standing. Pt is 6'4" and had difficulty making transition from arm rest to RW grips, requiring modA for coming to standing. Once in standing able to ambulate with minA. Practiced marching at [facility] in preparation for stair climbing. Pt requires heavy min A for returning LE to bed. D/c plans remain appropriate. PT will continue to follow acutely.     If plan is discharge home, recommend the following: A little help with walking and/or transfers;A little help with bathing/dressing/bathroom;Assist for transportation;Help with stairs or ramp for entrance;Assistance with cooking/housework   Can travel by private vehicle      Yes  Equipment Recommendations  None recommended by PT       Precautions / Restrictions Precautions Precautions: Fall Restrictions LLE Weight Bearing: Weight bearing as tolerated     Mobility  Bed Mobility Overal bed mobility: Needs Assistance Bed Mobility: Sit to Supine       Sit to supine: Min assist   General bed mobility comments: cues for technique heavy minA for bringing LE back into bed, requires assist for moving L LE with pt bridging hips to center in bed    Transfers Overall transfer level: Needs assistance Equipment used: Rolling walker (2 wheels) Transfers: Sit to/from Stand Sit to Stand: Mod assist           General transfer comment: pt requires 2 attempts and heavy modA for coming to upright, pt with increased forward flexion making it difficult to bring L LE underneath him     Ambulation/Gait Ambulation/Gait assistance: Min assist Gait Distance (Feet): 20 Feet Assistive device: Rolling walker (2 wheels) Gait Pattern/deviations: Step-to pattern, Shuffle, Trunk flexed       General Gait Details: continues to struggle with achieving a more upright posture to facilitate easier movment of his L LE, but improves with distance.      Balance Overall balance assessment: Needs assistance Sitting-balance support: Feet supported, No upper extremity supported Sitting balance-Leahy Scale: Good     Standing balance support: Bilateral upper extremity supported Standing balance-Leahy Scale: Poor Standing balance comment: reliant on UE support on RW due to L knee pain                            Cognition Arousal: Alert Behavior During Therapy: WFL for tasks assessed/performed Overall Cognitive Status: Within Functional Limits for tasks assessed                                          Exercises Total Joint Exercises Ankle Circles/Pumps: AROM, Both, 10 reps Quad Sets: AROM, Left, 10 reps Heel Slides: AROM, Seated (with towel under foot) Marching in Standing: AROM, Both, 10 reps, Standing (cues for upright posture)    General Comments General comments (skin integrity, edema, etc.): VSS on RA      Pertinent Vitals/Pain Pain Assessment Pain Assessment: Faces Faces  Pain Scale: Hurts whole lot Pain Location: L knee Pain Descriptors / Indicators: Aching, Discomfort Pain Intervention(s): Limited activity within patient's tolerance, Monitored during session, Repositioned     PT Goals (current goals can now be found in the care plan section) Acute Rehab PT Goals Patient Stated Goal: to get back to walking without the RW PT Goal Formulation: With patient Time For Goal Achievement: 03/30/23 Potential to Achieve Goals: Good Progress towards PT goals: Progressing toward goals    Frequency    Min 1X/week       AM-PAC PT "6  Clicks" Mobility   Outcome Measure  Help needed turning from your back to your side while in a flat bed without using bedrails?: A Little Help needed moving from lying on your back to sitting on the side of a flat bed without using bedrails?: A Little Help needed moving to and from a bed to a chair (including a wheelchair)?: A Little Help needed standing up from a chair using your arms (e.g., wheelchair or bedside chair)?: A Little Help needed to walk in hospital room?: A Little Help needed climbing 3-5 steps with a railing? : A Lot 6 Click Score: 17    End of Session Equipment Utilized During Treatment: Gait belt Activity Tolerance: Patient limited by pain Patient left: in bed;with call bell/phone within reach;with family/visitor present Nurse Communication: Mobility status PT Visit Diagnosis: Muscle weakness (generalized) (M62.81);Difficulty in walking, not elsewhere classified (R26.2);Pain Pain - Right/Left: Left Pain - part of body: Knee     Time: 8295-6213 PT Time Calculation (min) (ACUTE ONLY): 37 min  Charges:    $Gait Training: 8-22 mins $Therapeutic Exercise: 8-22 mins PT General Charges $$ ACUTE PT VISIT: 1 Visit                      B. Beverely Risen PT, DPT Acute Rehabilitation Services Please use secure chat or  Call Office [phone]    [name] 03/18/2023, 4:18 PM

## 2023-03-19 DIAGNOSIS — M00869 Arthritis due to other bacteria, unspecified knee: Secondary | ICD-10-CM | POA: Diagnosis not present

## 2023-03-19 MED ORDER — POLYETHYLENE GLYCOL 3350 17 G PO PACK
17.0000 g | PACK | Freq: Every day | ORAL | Status: DC
  Administered 2023-03-19 – 2023-03-20 (×2): 17 g via ORAL
  Filled 2023-03-19 (×2): qty 1

## 2023-03-19 MED ORDER — BISACODYL 10 MG RE SUPP: 10.0000 mg | Freq: Every day | RECTAL | Status: DC | PRN

## 2023-03-19 MED ORDER — HYDROCORTISONE 0.5 % EX CREA: TOPICAL_CREAM | Freq: Two times a day (BID) | CUTANEOUS | Status: DC | PRN

## 2023-03-19 NOTE — Progress Notes (Signed)
PROGRESS NOTE Dean Mayer  ZOX:[id number] DOB: [birth year] DOA: 03/14/2023 PCP: Angelique Blonder, MD  Brief Narrative/Hospital Course: 82yom w/ history of gout, HTN, HLD, BPH, hypothyroidism, throat cancer, positive ANA, positive rheumatoid factor who on 7/29 saw rheumatologist with complaints of left knee pain and swelling. At that point patient's knee was tapped and sent for synovial fluid analysis.  Patient is given intra-articular steroid joint infection.  Fluid analysis shows gout, he was started on allopurinol.  On 7/31 patient recontacted rheumatologist as his pain and swelling persisted.  He was prescribed a 9-day prednisone taper. Per patient a temporal improvement then his pain worsened.  He was not getting around with a walker.  He had difficulty getting in bed, out of bed, around the house and seen  in ED 03/11/2023>repeat arthrocentesis done AND discharged with hydrocodone, was given orthopedics referral but returned to ED 8/30 tonight  as he developed nausea and vomiting, fever Tmax 100.7 and knee remained painful. 7/29 joint aspiration>75,000 WBCs, cultures grew pansensitive MSSA and was positive for monosodium urate.  one 03/11/2023 synovial fluid>26,000 WBCs rare gram-positive cocci, no crystals noted. ESR 14.1, CRP 97 In the ER,repeat tap of his left knee done. He was afebrile.Left knee remains painful with much decreased range of motion.  Orthopedics on-call Dr. Susa Simmonds consulted and  admitted for MSSA left knee septic arthritis 8/9: Incision and drainage of left septic knee Dr Jena Gauss Given history of throat cancer and dysphagia felt that it would not be safe to proceed with TEE so planning for 6 weeks of IV antibiotics and close follow-up in the clinic.    Subjective: Patient seen and examined this morning Overnight afebrile, BP stable  C/o rash after bath-hydrocortisone added. No Bm for several days CBC and BMP reviewed overall unchanged besides mild drop in hemoglobin further   Repeat blood culture no growth to date  Assessment and Plan: Principal Problem:   Bacterial infection of knee joint (HCC) Active Problems:   MSSA bacteremia   Acute gout of right ankle  MSSA sepsis MSSA septic arthritis left knee Left knee pain x 3 to 4 weeks Initially suspected left acute flare up, workup showed MSSA sepsis secondary to MSSA septic arthritis Seen by ortho 8/9:I&D of left knee (Dr Jena Gauss).Synovial fluid 8/5- MSSA. and culture from OR  8/9> MSSA.Blood culture 8/8 with MSS ,repeated 03/15/23>NGTD. Appreciate ID inputs on board> need to eval other nidus of infection,TTE results-EF 65 to 70%, no RWMA, mitral valve normal no MR aortic valve indeterminate number of course no AR.Given history of throat cancer and dysphagia felt that it would not be safe to proceed with TEE so planning for 6 weeks of IV antibiotics and close follow-up in the clinic. MRI thoracic and lumbar spine w/o 8/10: no acute finding.  Waiting to clear bacteremia before proceeding with PICC line until Wednesday Continue on current Ancef, monitor temperature curve CBC  ?Acute gout flareup: continue allopurinol, s/p colchicine. cont pain control. Try to avoid steroids initially given infection process.  He did have crystals noticed on his initial KNEE TAP although cultures were not sent  Elevated LFTs W/ Hypoalbuminemia, new: likely from sepsis.Resolved.   Hyperglycemia/Prediabeteds (new): A1c 5.9.monitor   HTN: BP controlled, continue home amlodipine 10 and lisinopril 5.    HLD: Cont atorvastatin  Normocytic anemia mild: Monitor hb as below. Recent Labs  Lab 03/15/23 0359 03/16/23 0112 03/17/23 0154 03/18/23 0014 03/19/23 0303  HGB 12.2* 11.5* 11.4* 11.3* 10.9*  HCT 37.3* 34.8* 36.0* 35.3* 34.1*  BPH: Cont Flomax    Hypothyroidism Cont synthroid   History of throat cancer   Cataracts Cont eye drops  Constipation:  Miralax daily and PR dulcolax prn  DVT prophylaxis: SCDs Start:  03/15/23 1439 asa 325 mg daily Code Status:   Code Status: DNR Family Communication: plan of care discussed with patient at bedside. Patient status is: Inpatient because of septic knee Level of care: Med-Surg   Dispo: The patient is from: home            Anticipated disposition: Pending clinical improvement.  Setting of home IV antibiotic Objective: Vitals last 24 hrs: Vitals:   03/18/23 1421 03/18/23 2029 03/19/23 0647 03/19/23 0811  BP: (!) 108/44 (!) 135/55 (!) 156/63 133/62  Pulse: 76 68 65 62  Resp: 18 18 18 16   Temp: 97.9 F (36.6 C) 98 F (36.7 C) 98.1 F (36.7 C) (!) 97.5 F (36.4 C)  TempSrc: Oral  Oral Oral  SpO2: 97% 98% 97% 95%  Weight:      Height:       Weight change:   Physical Examination: General exam: alert awake, oriented at baseline, older than stated age HEENT:Oral mucosa moist, Ear/Nose WNL grossly Respiratory system: Bilaterally clear BS,no use of accessory muscle Cardiovascular system: S1 & S2 +, No JVD. Gastrointestinal system: Abdomen soft,NT,ND, BS+ Nervous System: Alert, awake, moving all extremities,and following commands. Extremities: LE edema neg, let knee in dressing- drain is out, distal peripheral pulses palpable and warm.  Skin: No rashes,no icterus. MSK: Normal muscle bulk,tone, power   Medications reviewed:  Scheduled Meds:  allopurinol  100 mg Oral Daily   amLODipine  10 mg Oral Daily   ascorbic acid  1,000 mg Oral Daily   aspirin EC  325 mg Oral Daily   atorvastatin  40 mg Oral QPM   brimonidine  1 drop Right Eye BID   docusate sodium  100 mg Oral BID   ferrous sulfate   Oral Daily   levothyroxine  100 mcg Oral Daily   lisinopril  5 mg Oral BID   omega-3 acid ethyl esters  1 g Oral Daily   tamsulosin  0.4 mg Oral Daily  Continuous Infusions:  sodium chloride 75 mL/hr at 03/19/23 0001    ceFAZolin (ANCEF) IV 2 g (03/19/23 0510)   methocarbamol (ROBAXIN) IV      Diet Order             Diet Heart Room service appropriate?  Yes; Fluid consistency: Thin  Diet effective now                  Intake/Output Summary (Last 24 hours) at 03/19/2023 1158 Last data filed at 03/19/2023 0510 Gross per 24 hour  Intake 480 ml  Output 500 ml  Net -20 ml   Net IO Since Admission: -2,287.52 mL [03/19/23 1158]  Wt Readings from Last 3 Encounters:  03/15/23 108 kg  03/11/23 108 kg  02/20/23 108 kg     Unresulted Labs (From admission, onward)     Start     Ordered   03/22/23 0500  Creatinine, serum  (enoxaparin (LOVENOX)    CrCl >/= 30 ml/min)  Weekly,   R     Comments: while on enoxaparin therapy    03/15/23 0130   03/16/23 0500  CBC  Daily,   R      03/15/23 1049   03/16/23 0500  Comprehensive metabolic panel  Daily,   R      03/15/23  1049   03/15/23 0500  CBC with Differential/Platelet  Tomorrow morning,   R        03/15/23 0130          Data Reviewed: I have personally reviewed following labs and imaging studies CBC: Recent Labs  Lab 03/15/23 0359 03/16/23 0112 03/17/23 0154 03/18/23 0014 03/19/23 0303  WBC 8.6 9.6 8.7 7.0 6.8  NEUTROABS 7.1  --   --   --   --   HGB 12.2* 11.5* 11.4* 11.3* 10.9*  HCT 37.3* 34.8* 36.0* 35.3* 34.1*  MCV 87.1 87.7 90.5 87.4 90.2  PLT 357 335 376 344 297   Basic Metabolic Panel: Recent Labs  Lab 03/15/23 0302 03/16/23 0112 03/17/23 1216 03/18/23 0014 03/19/23 0303  NA 134* 132* 136 133* 133*  K 3.8 4.1 3.6 3.7 3.9  CL 98 98 100 101 100  CO2 25 23 24 24 25   GLUCOSE 141* 175* 158* 115* 120*  BUN 21 18 12 13 12   CREATININE 0.77 0.90 0.78 0.76 0.81  CALCIUM 8.4* 8.3* 8.3* 8.1* 8.1*   GFR: Estimated Creatinine Clearance: 98 mL/min (by C-G formula based on SCr of 0.81 mg/dL). Liver Function Tests: Recent Labs  Lab 03/15/23 0302 03/16/23 0112 03/17/23 1216 03/18/23 0014 03/19/23 0303  AST 38 22 26 23 23   ALT 53* 31 19 14 11   ALKPHOS 120 105 101 96 97  BILITOT 0.7 0.2* 0.2* 0.3 0.4  PROT 5.8* 5.2* 5.1* 5.2* 5.2*  ALBUMIN 1.9* 1.6* 1.7* 1.7* 1.7*    Recent Labs  Lab 03/15/23 0302  INR 1.1   Recent Results (from the past 240 hour(s))  Body fluid culture w Gram Stain     Status: None   Collection Time: 03/11/23  8:39 AM   Specimen: Synovium; Body Fluid  Result Value Ref Range Status   Specimen Description SYNOVIAL  Final   Special Requests LEFT KNEE  Final   Gram Stain   Final    MODERATE WBC PRESENT, PREDOMINANTLY PMN NO ORGANISMS SEEN Performed at [facility], [address], [place], [place] [address]    Culture FEW STAPHYLOCOCCUS AUREUS  Final   Report Status 03/14/2023 FINAL  Final   Organism ID, Bacteria STAPHYLOCOCCUS AUREUS  Final      Susceptibility   Staphylococcus aureus - MIC*    CIPROFLOXACIN 1 SENSITIVE Sensitive     ERYTHROMYCIN <=0.25 SENSITIVE Sensitive     GENTAMICIN <=0.5 SENSITIVE Sensitive     OXACILLIN <=0.25 SENSITIVE Sensitive     TETRACYCLINE <=1 SENSITIVE Sensitive     VANCOMYCIN <=0.5 SENSITIVE Sensitive     TRIMETH/SULFA <=10 SENSITIVE Sensitive     CLINDAMYCIN <=0.25 SENSITIVE Sensitive     RIFAMPIN <=0.5 SENSITIVE Sensitive     Inducible Clindamycin NEGATIVE Sensitive     LINEZOLID 2 SENSITIVE Sensitive     * FEW STAPHYLOCOCCUS AUREUS  Blood culture (routine x 2)     Status: Abnormal   Collection Time: 03/14/23  9:39 PM   Specimen: BLOOD  Result Value Ref Range Status   Specimen Description BLOOD RIGHT ANTECUBITAL  Final   Special Requests   Final    BOTTLES DRAWN AEROBIC AND ANAEROBIC Blood Culture results may not be optimal due to an excessive volume of blood received in culture bottles   Culture  Setup Time   Final    GRAM POSITIVE COCCI IN CLUSTERS IN BOTH AEROBIC AND ANAEROBIC BOTTLES CRITICAL RESULT CALLED TO, READ BACK BY AND VERIFIED WITH: PHARMD [id number]  1502 TO SAVANNAH MOODY, ADC Performed at [facility], [address] [place], [place] [address]    Culture STAPHYLOCOCCUS AUREUS (A)  Final   Report Status 03/17/2023 FINAL  Final   Organism ID, Bacteria  STAPHYLOCOCCUS AUREUS  Final      Susceptibility   Staphylococcus aureus - MIC*    CIPROFLOXACIN 1 SENSITIVE Sensitive     ERYTHROMYCIN <=0.25 SENSITIVE Sensitive     GENTAMICIN <=0.5 SENSITIVE Sensitive     OXACILLIN <=0.25 SENSITIVE Sensitive     TETRACYCLINE <=1 SENSITIVE Sensitive     VANCOMYCIN <=0.5 SENSITIVE Sensitive     TRIMETH/SULFA <=10 SENSITIVE Sensitive     CLINDAMYCIN <=0.25 SENSITIVE Sensitive     RIFAMPIN <=0.5 SENSITIVE Sensitive     Inducible Clindamycin NEGATIVE Sensitive     LINEZOLID 2 SENSITIVE Sensitive     * STAPHYLOCOCCUS AUREUS  Body fluid culture w Gram Stain     Status: None   Collection Time: 03/14/23  9:39 PM   Specimen: Joint, Knee; Body Fluid  Result Value Ref Range Status   Specimen Description JOINT FLUID KNEE  Final   Special Requests NONE  Final   Gram Stain   Final    MODERATE WBC PRESENT,BOTH PMN AND MONONUCLEAR RARE GRAM POSITIVE COCCI Performed at [facility], [address], [place], [place] [address]    Culture RARE STAPHYLOCOCCUS AUREUS  Final   Report Status 03/17/2023 FINAL  Final   Organism ID, Bacteria STAPHYLOCOCCUS AUREUS  Final      Susceptibility   Staphylococcus aureus - MIC*    CIPROFLOXACIN 1 SENSITIVE Sensitive     ERYTHROMYCIN <=0.25 SENSITIVE Sensitive     GENTAMICIN <=0.5 SENSITIVE Sensitive     OXACILLIN <=0.25 SENSITIVE Sensitive     TETRACYCLINE <=1 SENSITIVE Sensitive     VANCOMYCIN <=0.5 SENSITIVE Sensitive     TRIMETH/SULFA <=10 SENSITIVE Sensitive     CLINDAMYCIN <=0.25 SENSITIVE Sensitive     RIFAMPIN <=0.5 SENSITIVE Sensitive     Inducible Clindamycin NEGATIVE Sensitive     LINEZOLID 2 SENSITIVE Sensitive     * RARE STAPHYLOCOCCUS AUREUS  Blood Culture ID Panel (Reflexed)     Status: Abnormal   Collection Time: 03/14/23  9:39 PM  Result Value Ref Range Status   Enterococcus faecalis NOT DETECTED NOT DETECTED Final   Enterococcus Faecium NOT DETECTED NOT DETECTED Final   Listeria monocytogenes  NOT DETECTED NOT DETECTED Final   Staphylococcus species DETECTED (A) NOT DETECTED Final    Comment: CRITICAL RESULT CALLED TO, READ BACK BY AND VERIFIED WITH: PHARMD SAVANNAH MOODY [id number], ADC    Staphylococcus aureus (BCID) DETECTED (A) NOT DETECTED Final    Comment: CRITICAL RESULT CALLED TO, READ BACK BY AND VERIFIED WITH: PHARMD SAVANNAH MOODY [id number], ADC    Staphylococcus epidermidis NOT DETECTED NOT DETECTED Final   Staphylococcus lugdunensis NOT DETECTED NOT DETECTED Final   Streptococcus species NOT DETECTED NOT DETECTED Final   Streptococcus agalactiae NOT DETECTED NOT DETECTED Final   Streptococcus pneumoniae NOT DETECTED NOT DETECTED Final   Streptococcus pyogenes NOT DETECTED NOT DETECTED Final   A.calcoaceticus-baumannii NOT DETECTED NOT DETECTED Final   Bacteroides fragilis NOT DETECTED NOT DETECTED Final   Enterobacterales NOT DETECTED NOT DETECTED Final   Enterobacter cloacae complex NOT DETECTED NOT DETECTED Final   Escherichia coli NOT DETECTED NOT DETECTED Final   Klebsiella aerogenes NOT DETECTED NOT DETECTED Final   Klebsiella oxytoca NOT DETECTED NOT DETECTED Final  Klebsiella pneumoniae NOT DETECTED NOT DETECTED Final   Proteus species NOT DETECTED NOT DETECTED Final   Salmonella species NOT DETECTED NOT DETECTED Final   Serratia marcescens NOT DETECTED NOT DETECTED Final   Haemophilus influenzae NOT DETECTED NOT DETECTED Final   Neisseria meningitidis NOT DETECTED NOT DETECTED Final   Pseudomonas aeruginosa NOT DETECTED NOT DETECTED Final   Stenotrophomonas maltophilia NOT DETECTED NOT DETECTED Final   Candida albicans NOT DETECTED NOT DETECTED Final   Candida auris NOT DETECTED NOT DETECTED Final   Candida glabrata NOT DETECTED NOT DETECTED Final   Candida krusei NOT DETECTED NOT DETECTED Final   Candida parapsilosis NOT DETECTED NOT DETECTED Final   Candida tropicalis NOT DETECTED NOT DETECTED Final   Cryptococcus neoformans/gattii NOT  DETECTED NOT DETECTED Final   Meth resistant mecA/C and MREJ NOT DETECTED NOT DETECTED Final    Comment: Performed at [facility], [address], [place], [place] [address]  Blood culture (routine x 2)     Status: None (Preliminary result)   Collection Time: 03/15/23 11:08 AM   Specimen: BLOOD LEFT ARM  Result Value Ref Range Status   Specimen Description BLOOD LEFT ARM  Final   Special Requests   Final    BOTTLES DRAWN AEROBIC AND ANAEROBIC Blood Culture results may not be optimal due to an inadequate volume of blood received in culture bottles   Culture   Final    NO GROWTH 4 DAYS Performed at [facility], [address], [place], [place] [address]    Report Status PENDING  Incomplete  Surgical pcr screen     Status: Abnormal   Collection Time: 03/15/23 11:45 AM   Specimen: Nasal Mucosa; Nasal Swab  Result Value Ref Range Status   MRSA, PCR (A) NEGATIVE Corrected    INVALID, UNABLE TO DETERMINE THE PRESENCE OF TARGET DUE TO SPECIMEN INTEGRITY. RECOLLECTION REQUESTED.   Staphylococcus aureus (A) NEGATIVE Corrected    INVALID, UNABLE TO DETERMINE THE PRESENCE OF TARGET DUE TO SPECIMEN INTEGRITY. RECOLLECTION REQUESTED.    Comment: NOTIFIED RN Koleen Nimrod D ON 03/15/23 @ 1547 BY DRT Performed at [facility], [address], [place], [place] [address] CORRECTED ON 08/09 AT 1547: PREVIOUSLY REPORTED AS INVALID, UNABLE TO DETERMINE THE PRESENCE OF TARGET DUE TO SPECIMEN INTEGRITY. RECOLLECTION REQUESTED.   Fungus Culture With Stain     Status: None (Preliminary result)   Collection Time: 03/15/23 12:48 PM   Specimen: Soft Tissue, Other  Result Value Ref Range Status   Fungus Stain Final report  Final    Comment: (NOTE) Performed At: [facility] [address] [place], [place] [id number] Jolene Schimke MD [id number]    Fungus (Mycology) Culture PENDING  Incomplete   Fungal Source TISSUE  Final    Comment: Performed at [facility], [address] [place], [place] [address]  Aerobic/Anaerobic Culture w Gram Stain (surgical/deep wound)     Status: None (Preliminary result)   Collection Time: 03/15/23 12:48 PM   Specimen: Soft Tissue, Other  Result Value Ref Range Status   Specimen Description TISSUE  Final   Special Requests left knee  Final   Gram Stain   Final    ABUNDANT WBC PRESENT, PREDOMINANTLY PMN RARE GRAM POSITIVE COCCI IN PAIRS Performed at [facility], [address] [place], [place] [address]    Culture   Final    RARE STAPHYLOCOCCUS AUREUS NO ANAEROBES ISOLATED; CULTURE IN PROGRESS FOR 5 DAYS  Report Status PENDING  Incomplete   Organism ID, Bacteria STAPHYLOCOCCUS AUREUS  Final      Susceptibility   Staphylococcus aureus - MIC*    CIPROFLOXACIN 1 SENSITIVE Sensitive     ERYTHROMYCIN <=0.25 SENSITIVE Sensitive     GENTAMICIN <=0.5 SENSITIVE Sensitive     OXACILLIN <=0.25 SENSITIVE Sensitive     TETRACYCLINE <=1 SENSITIVE Sensitive     VANCOMYCIN <=0.5 SENSITIVE Sensitive     TRIMETH/SULFA <=10 SENSITIVE Sensitive     CLINDAMYCIN <=0.25 SENSITIVE Sensitive     RIFAMPIN <=0.5 SENSITIVE Sensitive     Inducible Clindamycin NEGATIVE Sensitive     LINEZOLID 2 SENSITIVE Sensitive     * RARE STAPHYLOCOCCUS AUREUS  Fungus Culture Result     Status: None   Collection Time: 03/15/23 12:48 PM  Result Value Ref Range Status   Result 1 Comment  Final    Comment: (NOTE) KOH/Calcofluor preparation:  no fungus observed. Performed At: [facility] [address] [place], [place] [id number] Jolene Schimke MD ZS:[id number]   Culture, blood (Routine X 2) w Reflex to ID Panel     Status: None (Preliminary result)   Collection Time: 03/17/23 11:40 AM   Specimen: BLOOD LEFT HAND  Result Value Ref Range Status   Specimen Description BLOOD LEFT HAND  Final   Special Requests   Final    BOTTLES DRAWN AEROBIC AND ANAEROBIC Blood Culture adequate volume   Culture   Final    NO GROWTH 2 DAYS Performed at [facility], [address], [place], [place] [address]    Report Status PENDING  Incomplete  Culture, blood (Routine X 2) w Reflex to ID Panel     Status: None (Preliminary result)   Collection Time: 03/17/23 11:49 AM   Specimen: BLOOD RIGHT HAND  Result Value Ref Range Status   Specimen Description BLOOD RIGHT HAND  Final   Special Requests   Final    BOTTLES DRAWN AEROBIC AND ANAEROBIC Blood Culture adequate volume   Culture   Final    NO GROWTH 2 DAYS Performed at [facility], [address], [place], [place] [address]    Report Status PENDING  Incomplete    Antimicrobials: Anti-infectives (From admission, onward)    Start     Dose/Rate Route Frequency Ordered Stop   03/15/23 1545  ceFAZolin (ANCEF) IVPB 2g/100 mL premix        2 g 200 mL/hr over 30 Minutes Intravenous Every 8 hours 03/15/23 1454     03/15/23 1220  vancomycin (VANCOCIN) powder  Status:  Discontinued          As needed 03/15/23 1221 03/15/23 1319   03/15/23 1145  ceFAZolin (ANCEF) IVPB 2g/100 mL premix        2 g 200 mL/hr over 30 Minutes Intravenous On call to O.R. 03/15/23 1134 03/15/23 1253   03/15/23 0600  ceFAZolin (ANCEF) IVPB 2g/100 mL premix  Status:  Discontinued        2 g 200 mL/hr over 30 Minutes Intravenous Every 8 hours 03/15/23 0159 03/15/23 1438   03/14/23 2245  ceFAZolin (ANCEF) IVPB 2g/100 mL premix        2 g 200 mL/hr over 30 Minutes Intravenous  Once 03/14/23 2234 03/15/23 0030      Culture/Microbiology    Component Value Date/Time   SDES BLOOD RIGHT HAND 03/17/2023 1149   SPECREQUEST  03/17/2023 1149    BOTTLES DRAWN AEROBIC AND ANAEROBIC Blood Culture adequate  volume   CULT  03/17/2023 1149    NO GROWTH 2 DAYS Performed at [facility], [address] [place], [place] [address]    REPTSTATUS PENDING 03/17/2023 1149    Radiology Studies: No results found.   LOS: 4 days   Lanae Boast, MD [facility]  03/19/2023, 11:58 AM

## 2023-03-19 NOTE — Progress Notes (Signed)
Physical Therapy Treatment Patient Details Name: Dean Mayer MRN: [id number] DOB: [birth year] Today's Date: 03/19/2023   History of Present Illness 82 y.o. male presenting with 2 week hx of L knee pain, found to have a septic L knee. He is now s/p I&D on 03/14/23 (day of admission). PMH of HTN, Throat and prostate CA.    PT Comments  Pt sitting up in recliner agreeable to working with therapy. Began session with seated exercises. Afterward pt requiring min A for power up from lower recliner surface to walk into to bathroom. Contact guard to stand up from [facility] over toilet. Pt ambulates with min A out into hallway however fatigues quickly and needs to return to room. D/c plan remains appropriate at this time but has 2 steps to navigate into his home. Will work on Museum/gallery curator in next session.    If plan is discharge home, recommend the following: A little help with walking and/or transfers;A little help with bathing/dressing/bathroom;Assist for transportation;Help with stairs or ramp for entrance;Assistance with cooking/housework     Equipment Recommendations  None recommended by PT       Precautions / Restrictions Precautions Precautions: Fall Restrictions Weight Bearing Restrictions: Yes LLE Weight Bearing: Weight bearing as tolerated     Mobility  Bed Mobility Overal bed mobility: Needs Assistance Bed Mobility: Supine to Sit       Sit to supine: Mod assist, HOB elevated, Used rails   General bed mobility comments: vc for sequencing and modA for pad scoot of hips to square with bed and for careful lowering of L LE to floor    Transfers Overall transfer level: Needs assistance Equipment used: Rolling walker (2 wheels) Transfers: Sit to/from Stand Sit to Stand: From elevated surface, Contact guard assist, Min assist   Step pivot transfers: Contact guard assist       General transfer comment: pt requires minA for power up and steadying from lower recliner, cues for  sequencing and increased L knee flexion prior to power up, pt is contact guard for power up from [facility] over toilet    Ambulation/Gait Ambulation/Gait assistance: Min assist Gait Distance (Feet): 15 Feet (+30) Assistive device: Rolling walker (2 wheels) Gait Pattern/deviations: Step-to pattern, Shuffle, Trunk flexed Gait velocity: slowed Gait velocity interpretation: <1.31 ft/sec, indicative of household ambulator   General Gait Details: continued vc for more upright posture, rolling the RW instead of picking it up, with fatigue increased trunk and knee flexion, encouraged increased UE use on RW for support with L LE weightbearing       Balance Overall balance assessment: Needs assistance Sitting-balance support: Feet supported, No upper extremity supported Sitting balance-Leahy Scale: Good     Standing balance support: Bilateral upper extremity supported Standing balance-Leahy Scale: Poor Standing balance comment: reliant on UE support on RW due to L knee pain                            Cognition Arousal: Alert Behavior During Therapy: WFL for tasks assessed/performed Overall Cognitive Status: Within Functional Limits for tasks assessed                                          Exercises Total Joint Exercises Ankle Circles/Pumps: AROM, Both, 10 reps Quad Sets: AROM, Left, 10 reps Heel Slides: AROM, Seated (with towel under foot) Long Arc  Quad: AROM, Left, 5 reps, Seated Marching in Standing: AROM, Both, 10 reps, Standing (cues for upright posture) General Exercises - Lower Extremity Hip Flexion/Marching: AROM, Left, 10 reps, Seated    General Comments General comments (skin integrity, edema, etc.): VSS on RA      Pertinent Vitals/Pain Pain Assessment Pain Assessment: Faces Faces Pain Scale: Hurts even more Pain Location: L knee Pain Descriptors / Indicators: Aching, Discomfort Pain Intervention(s): Limited activity within patient's  tolerance, Monitored during session, Repositioned, Patient requesting pain meds-RN notified     PT Goals (current goals can now be found in the care plan section) Acute Rehab PT Goals Patient Stated Goal: to get back to walking without the RW PT Goal Formulation: With patient Time For Goal Achievement: 03/30/23 Potential to Achieve Goals: Good Progress towards PT goals: Progressing toward goals    Frequency    Min 1X/week       AM-PAC PT "6 Clicks" Mobility   Outcome Measure  Help needed turning from your back to your side while in a flat bed without using bedrails?: A Little Help needed moving from lying on your back to sitting on the side of a flat bed without using bedrails?: A Little Help needed moving to and from a bed to a chair (including a wheelchair)?: A Little Help needed standing up from a chair using your arms (e.g., wheelchair or bedside chair)?: A Little Help needed to walk in hospital room?: A Little Help needed climbing 3-5 steps with a railing? : A Lot 6 Click Score: 17    End of Session Equipment Utilized During Treatment: Gait belt Activity Tolerance: Patient limited by pain Patient left: with call bell/phone within reach;with family/visitor present;in chair Nurse Communication: Mobility status PT Visit Diagnosis: Muscle weakness (generalized) (M62.81);Difficulty in walking, not elsewhere classified (R26.2);Pain Pain - Right/Left: Left Pain - part of body: Knee     Time: 0981-1914 PT Time Calculation (min) (ACUTE ONLY): 41 min  Charges:    $Gait Training: 8-22 mins $Therapeutic Exercise: 8-22 mins $Therapeutic Activity: 8-22 mins PT General Charges $$ ACUTE PT VISIT: 1 Visit                      B. Beverely Risen PT, DPT Acute Rehabilitation Services Please use secure chat or  Call Office [phone]    [name] [facility] 03/19/2023, 12:08 PM

## 2023-03-19 NOTE — Plan of Care (Signed)

## 2023-03-19 NOTE — Progress Notes (Signed)
Physical Therapy Treatment Patient Details Name: Dean Mayer MRN: [id number] DOB: 1940/11/24 Today's Date: 03/19/2023   History of Present Illness 82 y.o. male presenting with 2 week hx of L knee pain, found to have a septic L knee. He is now s/p I&D on 03/14/23 (day of admission). PMH of HTN, Throat and prostate CA.    PT Comments  Pt supine in bed on entry, wife had just arrived with breakfast. Encouraged pt to get up to recliner to eat his breakfast. Pt initially reluctant, however with wife's encouragement agrees. Pt with gentle AROM of L LE prior to getting up. Pt requires modA and heavy use of bedrails to come to EoB. Once there contact guard for power up to RW from elevated bed and stepping to recliner for breakfast. PT agreeable to come back later for ambulation in hallway.    If plan is discharge home, recommend the following: A little help with walking and/or transfers;A little help with bathing/dressing/bathroom;Assist for transportation;Help with stairs or ramp for entrance;Assistance with cooking/housework     Equipment Recommendations  None recommended by PT       Precautions / Restrictions Precautions Precautions: Fall Restrictions Weight Bearing Restrictions: Yes LLE Weight Bearing: Weight bearing as tolerated     Mobility  Bed Mobility Overal bed mobility: Needs Assistance Bed Mobility: Supine to Sit       Sit to supine: Mod assist, HOB elevated, Used rails   General bed mobility comments: vc for sequencing and modA for pad scoot of hips to square with bed and for careful lowering of L LE to floor    Transfers Overall transfer level: Needs assistance Equipment used: Rolling walker (2 wheels) Transfers: Sit to/from Stand Sit to Stand: From elevated surface, Contact guard assist, Min assist   Step pivot transfers: Contact guard assist       General transfer comment: vc for hand placement for power up, with bed elevated pt requires increased time and  effort to come to fully upright but does not need any physical assist for coming to standing and stepping to chair with RW, pt requires minA for power up and steadying from lower recliner,          Balance Overall balance assessment: Needs assistance Sitting-balance support: Feet supported, No upper extremity supported Sitting balance-Leahy Scale: Good     Standing balance support: Bilateral upper extremity supported Standing balance-Leahy Scale: Poor Standing balance comment: reliant on UE support on RW due to L knee pain                            Cognition Arousal: Alert Behavior During Therapy: WFL for tasks assessed/performed Overall Cognitive Status: Within Functional Limits for tasks assessed                                             General Comments General comments (skin integrity, edema, etc.): VSS on RA      Pertinent Vitals/Pain Pain Assessment Pain Assessment: Faces Faces Pain Scale: Hurts even more Pain Location: L knee Pain Descriptors / Indicators: Aching, Discomfort Pain Intervention(s): Limited activity within patient's tolerance, Monitored during session, Repositioned     PT Goals (current goals can now be found in the care plan section) Acute Rehab PT Goals Patient Stated Goal: to get back to walking without the RW  PT Goal Formulation: With patient Time For Goal Achievement: 03/30/23 Potential to Achieve Goals: Good Progress towards PT goals: Progressing toward goals    Frequency    Min 1X/week       AM-PAC PT "6 Clicks" Mobility   Outcome Measure  Help needed turning from your back to your side while in a flat bed without using bedrails?: A Little Help needed moving from lying on your back to sitting on the side of a flat bed without using bedrails?: A Little Help needed moving to and from a bed to a chair (including a wheelchair)?: A Little Help needed standing up from a chair using your arms (e.g.,  wheelchair or bedside chair)?: A Little Help needed to walk in hospital room?: A Little Help needed climbing 3-5 steps with a railing? : A Lot 6 Click Score: 17    End of Session Equipment Utilized During Treatment: Gait belt Activity Tolerance: Patient limited by pain Patient left: with call bell/phone within reach;with family/visitor present;in chair Nurse Communication: Mobility status PT Visit Diagnosis: Muscle weakness (generalized) (M62.81);Difficulty in walking, not elsewhere classified (R26.2);Pain Pain - Right/Left: Left Pain - part of body: Knee     Time: 1610-9604 PT Time Calculation (min) (ACUTE ONLY): 12 min  Charges:    $Therapeutic Activity: 8-22 mins PT General Charges $$ ACUTE PT VISIT: 1 Visit                      B. Beverely Risen PT, DPT Acute Rehabilitation Services Please use secure chat or  Call Office [phone]    [name] [facility] 03/19/2023, 12:00 PM

## 2023-03-19 NOTE — Progress Notes (Signed)
Occupational Therapy Treatment Patient Details Name: KHAMANI FABRY MRN: [id number] DOB: 1940-11-22 Today's Date: 03/19/2023   History of present illness 82 y.o. male presenting with 2 week hx of L knee pain, found to have a septic L knee. He is now s/p I&D on 03/14/23 (day of admission). PMH of HTN, Throat and prostate CA.   OT comments  Pt in good spirits, c/o of pain but motivated to participate. Pt requires significantly increased time for bed mobility/ambulation due to L knee pain, min A for bed mobility, CGA for ambulation. Pt displays overall good safety awareness. Pt and wife educated on safe transfer, using BSC if needed rather than ambulating to toilet and back, and overall energy conservation. Pt states he has all DME and support needed at home to remain safe, no further DME needed. Pt would benefit greatly from continued skilled OT to further improve functional strength/independence prior to return home to decrease caregiver burden and maximize safety, no follow up OT needed.       If plan is discharge home, recommend the following:  A little help with walking and/or transfers;Assistance with cooking/housework;Assist for transportation;A lot of help with bathing/dressing/bathroom   Equipment Recommendations  None recommended by OT    Recommendations for Other Services      Precautions / Restrictions Precautions Precautions: Fall Restrictions Weight Bearing Restrictions: Yes LLE Weight Bearing: Weight bearing as tolerated       Mobility Bed Mobility Overal bed mobility: Needs Assistance Bed Mobility: Supine to Sit, Sit to Supine     Supine to sit: Min assist Sit to supine: HOB elevated, Mod assist   General bed mobility comments: assist for BLEs, greatly increased time due to L knee pain    Transfers Overall transfer level: Needs assistance Equipment used: Rolling walker (2 wheels) Transfers: Sit to/from Stand Sit to Stand: From elevated surface, Contact guard  assist, Min assist           General transfer comment: min A from elevated surface, good overall balance during transfer     Balance Overall balance assessment: Needs assistance Sitting-balance support: Feet supported, No upper extremity supported Sitting balance-Leahy Scale: Good Sitting balance - Comments: EOB ADLs   Standing balance support: Bilateral upper extremity supported Standing balance-Leahy Scale: Poor Standing balance comment: reliant on UE support on RW due to L knee pain                           ADL either performed or assessed with clinical judgement   ADL Overall ADL's : Needs assistance/impaired Eating/Feeding: Independent;Sitting   Grooming: Set up;Supervision/safety;Sitting           Upper Body Dressing : Modified independent;Sitting   Lower Body Dressing: Maximal assistance;Sit to/from stand   Toilet Transfer: Contact guard assist;Rolling walker (2 wheels);Ambulation           Functional mobility during ADLs: Contact guard assist;Rolling walker (2 wheels) General ADL Comments: increased time for mobility due to L knee pain, assistance with LB ADLs    Extremity/Trunk Assessment              Vision       Perception     Praxis      Cognition Arousal: Alert Behavior During Therapy: WFL for tasks assessed/performed Overall Cognitive Status: Within Functional Limits for tasks assessed  Exercises      Shoulder Instructions       General Comments      Pertinent Vitals/ Pain       Pain Assessment Pain Assessment: No/denies pain Pain Score: 6  Faces Pain Scale: Hurts even more Pain Location: L knee Pain Descriptors / Indicators: Aching, Discomfort Pain Intervention(s): Monitored during session  Home Living                                          Prior Functioning/Environment              Frequency  Min 1X/week         Progress Toward Goals  OT Goals(current goals can now be found in the care plan section)  Progress towards OT goals: Progressing toward goals  Acute Rehab OT Goals Patient Stated Goal: to return home OT Goal Formulation: With patient/family Time For Goal Achievement: 03/30/23 Potential to Achieve Goals: Good ADL Goals Pt Will Perform Grooming: with supervision;standing Pt Will Perform Lower Body Bathing: sitting/lateral leans;with supervision Pt Will Perform Lower Body Dressing: with supervision;sitting/lateral leans;with adaptive equipment Pt Will Transfer to Toilet: with supervision;ambulating  Plan      Co-evaluation                 AM-PAC OT "6 Clicks" Daily Activity     Outcome Measure   Help from another person eating meals?: None Help from another person taking care of personal grooming?: A Little Help from another person toileting, which includes using toliet, bedpan, or urinal?: A Little Help from another person bathing (including washing, rinsing, drying)?: A Lot Help from another person to put on and taking off regular upper body clothing?: None Help from another person to put on and taking off regular lower body clothing?: A Lot 6 Click Score: 18    End of Session Equipment Utilized During Treatment: Gait belt;Rolling walker (2 wheels)  OT Visit Diagnosis: Unsteadiness on feet (R26.81);Other abnormalities of gait and mobility (R26.89);Pain Pain - Right/Left: Left Pain - part of body: Knee   Activity Tolerance Patient tolerated treatment well   Patient Left in bed;with call bell/phone within reach;with nursing/sitter in room;with family/visitor present   Nurse Communication Mobility status        Time: 2956-2130 OT Time Calculation (min): 33 min  Charges: OT General Charges $OT Visit: 1 Visit OT Treatments $Self Care/Home Management : 23-37 mins  [place], OTR/L   Alexis Goodell 03/19/2023, 5:16 PM

## 2023-03-19 NOTE — Progress Notes (Signed)
OT Cancellation Note  Patient Details Name: IVIS BERENDS MRN: [id number] DOB: 1941-01-08   Cancelled Treatment:    Reason Eval/Treat Not Completed: (P) Pain limiting ability to participate, Pt states after PT treatment he is 10/10 pain L knee, not able to participate at this time due to pain, will check back later.  Alexis Goodell 03/19/2023, 1:49 PM

## 2023-03-20 ENCOUNTER — Inpatient Hospital Stay (HOSPITAL_COMMUNITY)
Admission: EM | Admit: 2023-03-20 | Discharge: 2023-03-27 | DRG: 563 | Disposition: A | Discharge status: Skilled Nursing Facility | Payer: Medicare HMO | Attending: Internal Medicine | Admitting: Internal Medicine

## 2023-03-20 ENCOUNTER — Other Ambulatory Visit: Payer: Self-pay

## 2023-03-20 ENCOUNTER — Other Ambulatory Visit (HOSPITAL_COMMUNITY): Payer: Self-pay

## 2023-03-20 ENCOUNTER — Encounter (HOSPITAL_COMMUNITY): Payer: Self-pay

## 2023-03-20 ENCOUNTER — Emergency Department (HOSPITAL_COMMUNITY): Payer: Medicare HMO

## 2023-03-20 DIAGNOSIS — Z7982 Long term (current) use of aspirin: Secondary | ICD-10-CM

## 2023-03-20 DIAGNOSIS — R768 Other specified abnormal immunological findings in serum: Secondary | ICD-10-CM | POA: Diagnosis present

## 2023-03-20 DIAGNOSIS — E785 Hyperlipidemia, unspecified: Secondary | ICD-10-CM | POA: Diagnosis present

## 2023-03-20 DIAGNOSIS — Z79890 Hormone replacement therapy (postmenopausal): Secondary | ICD-10-CM

## 2023-03-20 DIAGNOSIS — B9561 Methicillin susceptible Staphylococcus aureus infection as the cause of diseases classified elsewhere: Secondary | ICD-10-CM | POA: Diagnosis present

## 2023-03-20 DIAGNOSIS — M25462 Effusion, left knee: Secondary | ICD-10-CM | POA: Diagnosis present

## 2023-03-20 DIAGNOSIS — E039 Hypothyroidism, unspecified: Secondary | ICD-10-CM | POA: Diagnosis present

## 2023-03-20 DIAGNOSIS — N400 Enlarged prostate without lower urinary tract symptoms: Secondary | ICD-10-CM | POA: Diagnosis present

## 2023-03-20 DIAGNOSIS — Z8546 Personal history of malignant neoplasm of prostate: Secondary | ICD-10-CM

## 2023-03-20 DIAGNOSIS — M00062 Staphylococcal arthritis, left knee: Secondary | ICD-10-CM | POA: Diagnosis present

## 2023-03-20 DIAGNOSIS — Z87891 Personal history of nicotine dependence: Secondary | ICD-10-CM

## 2023-03-20 DIAGNOSIS — R7303 Prediabetes: Secondary | ICD-10-CM | POA: Diagnosis present

## 2023-03-20 DIAGNOSIS — M009 Pyogenic arthritis, unspecified: Secondary | ICD-10-CM | POA: Diagnosis present

## 2023-03-20 DIAGNOSIS — K219 Gastro-esophageal reflux disease without esophagitis: Secondary | ICD-10-CM | POA: Diagnosis present

## 2023-03-20 DIAGNOSIS — Z85819 Personal history of malignant neoplasm of unspecified site of lip, oral cavity, and pharynx: Secondary | ICD-10-CM

## 2023-03-20 DIAGNOSIS — I10 Essential (primary) hypertension: Secondary | ICD-10-CM | POA: Diagnosis present

## 2023-03-20 DIAGNOSIS — S83289A Other tear of lateral meniscus, current injury, unspecified knee, initial encounter: Secondary | ICD-10-CM | POA: Diagnosis not present

## 2023-03-20 DIAGNOSIS — M25569 Pain in unspecified knee: Secondary | ICD-10-CM | POA: Diagnosis present

## 2023-03-20 DIAGNOSIS — Z8249 Family history of ischemic heart disease and other diseases of the circulatory system: Secondary | ICD-10-CM

## 2023-03-20 DIAGNOSIS — Z79899 Other long term (current) drug therapy: Secondary | ICD-10-CM

## 2023-03-20 DIAGNOSIS — X501XXA Overexertion from prolonged static or awkward postures, initial encounter: Secondary | ICD-10-CM

## 2023-03-20 DIAGNOSIS — Z87442 Personal history of urinary calculi: Secondary | ICD-10-CM

## 2023-03-20 DIAGNOSIS — Z823 Family history of stroke: Secondary | ICD-10-CM

## 2023-03-20 DIAGNOSIS — S83242A Other tear of medial meniscus, current injury, left knee, initial encounter: Principal | ICD-10-CM

## 2023-03-20 DIAGNOSIS — D62 Acute posthemorrhagic anemia: Secondary | ICD-10-CM | POA: Diagnosis not present

## 2023-03-20 DIAGNOSIS — M00869 Arthritis due to other bacteria, unspecified knee: Secondary | ICD-10-CM

## 2023-03-20 DIAGNOSIS — M109 Gout, unspecified: Secondary | ICD-10-CM | POA: Diagnosis present

## 2023-03-20 MED ORDER — CHLORHEXIDINE GLUCONATE CLOTH 2 % EX PADS: 6.0000 | MEDICATED_PAD | Freq: Every day | CUTANEOUS | Status: DC

## 2023-03-20 MED ORDER — DOCUSATE SODIUM 100 MG PO CAPS
100.0000 mg | ORAL_CAPSULE | Freq: Two times a day (BID) | ORAL | 0 refills | Status: AC
  Filled 2023-03-20: qty 100, 50d supply, fill #0

## 2023-03-20 MED ORDER — PNEUMOCOCCAL 20-VAL CONJ VACC 0.5 ML IM SUSY
0.5000 mL | PREFILLED_SYRINGE | INTRAMUSCULAR | Status: AC | PRN
  Administered 2023-03-20: 0.5 mL via INTRAMUSCULAR
  Filled 2023-03-20: qty 0.5

## 2023-03-20 MED ORDER — POLYETHYLENE GLYCOL 3350 17 GM/SCOOP PO POWD
17.0000 g | Freq: Every day | ORAL | 0 refills | Status: AC | PRN
  Filled 2023-03-20: qty 238, 14d supply, fill #0

## 2023-03-20 MED ORDER — SODIUM CHLORIDE 0.9% FLUSH: 10.0000 mL | INTRAVENOUS | Status: DC | PRN

## 2023-03-20 MED ORDER — SENNOSIDES-DOCUSATE SODIUM 8.6-50 MG PO TABS: 1.0000 | ORAL_TABLET | Freq: Every evening | ORAL | 0 refills | Status: AC | PRN

## 2023-03-20 MED ORDER — CEFAZOLIN IV (FOR PTA / DISCHARGE USE ONLY): 2.0000 g | Freq: Three times a day (TID) | INTRAVENOUS | 0 refills | Status: DC

## 2023-03-20 MED ORDER — HYDROCODONE-ACETAMINOPHEN 5-325 MG PO TABS
1.0000 | ORAL_TABLET | Freq: Four times a day (QID) | ORAL | 0 refills | Status: DC | PRN
  Filled 2023-03-20: qty 20, 5d supply, fill #0

## 2023-03-20 NOTE — Discharge Summary (Signed)
Physician Discharge Summary  LANDEN AMANTE VWU:[id number] DOB: 1941/08/02 DOA: 03/14/2023  PCP: Angelique Blonder, MD  Admit date: 03/14/2023 Discharge date: 03/20/2023 Recommendations for Outpatient Follow-up:  Follow up with PCP in 1 weeks-call for appointment Please obtain CMP/CBC in one week Follow-up with Dr. Jena Gauss in 2 weeks Follow-up with ID clinic while on IV antibiotics  Discharge Dispo: Home w/ [facility] Discharge Condition: Stable Code Status:   Code Status: DNR Diet recommendation:  Diet Order             Diet - low sodium heart healthy           Diet Heart Room service appropriate? Yes; Fluid consistency: Thin  Diet effective now                    Brief/Interim Summary: 82yom w/ history of gout, HTN, HLD, BPH, hypothyroidism, throat cancer, positive ANA, positive rheumatoid factor who on 7/29 saw rheumatologist with complaints of left knee pain and swelling. At that point patient's knee was tapped and sent for synovial fluid analysis.  Patient is given intra-articular steroid joint infection.  Fluid analysis shows gout, he was started on allopurinol.  On 7/31 patient recontacted rheumatologist as his pain and swelling persisted.  He was prescribed a 9-day prednisone taper. Per patient a temporal improvement then his pain worsened.  He was not getting around with a walker.  He had difficulty getting in bed, out of bed, around the house and seen  in ED 03/11/2023>repeat arthrocentesis done AND discharged with hydrocodone, was given orthopedics referral but returned to ED 8/30 tonight  as he developed nausea and vomiting, fever Tmax 100.7 and knee remained painful. 7/29 joint aspiration>75,000 WBCs, cultures grew pansensitive MSSA and was positive for monosodium urate.  one 03/11/2023 synovial fluid>26,000 WBCs rare gram-positive cocci, no crystals noted. ESR 14.1, CRP 97 In the ER,repeat tap of his left knee done. He was afebrile.Left knee remains painful with much decreased range  of motion.  Orthopedics on-call Dr. Susa Simmonds consulted and  admitted for MSSA left knee septic arthritis 8/9: Incision and drainage of left septic knee Dr Jena Gauss Given history of throat cancer and dysphagia felt that it would not be safe to proceed with TEE so planning for 6 weeks of IV antibiotics and close follow-up in the clinic.  Discussed with ID and Dr. Jena Gauss and okay for discharge after PICC line placement today home health is being set up for home antibiotics.  Discharge Diagnoses:  Principal Problem:   Bacterial infection of knee joint (HCC) Active Problems:   MSSA bacteremia   Acute gout of right ankle  MSSA sepsis MSSA septic arthritis left knee Left knee pain x 3 to 4 weeks Initially suspected left acute flare up, workup showed MSSA sepsis secondary to MSSA septic arthritis Seen by ortho 8/9:I&D of left knee (Dr Jena Gauss).Synovial fluid 8/5- MSSA. and culture from OR  8/9> MSSA.Blood culture 8/8 with MSSA ,repeated blood cx 03/17/23>NGTD. Appreciate ID inputs on board> TTE results-EF 65 to 70%, no RWMA, mitral valve normal no MR aortic valve indeterminate number of course no AR.Given history of throat cancer and dysphagia felt that it would not be safe to proceed with TEE so planning for 6 weeks of IV antibiotics and close follow-up in the clinic. MRI thoracic and lumbar spine w/o 8/10: no acute finding.  Waiting to clear bacteremia before proceeding with PICC line until Wednesday and then dc home  ?Acute gout flareup: continue allopurinol, s/p colchicine. cont  pain control. Try to avoid steroids initially given infection process.  He did have crystals noticed on his initial KNEE TAP although cultures were not sent  Elevated LFTs W/ Hypoalbuminemia, new: likely from sepsis.Resolved.   Hyperglycemia/Prediabeteds (new): A1c 5.9.monitor   HTN: BP controlled, continue home amlodipine 10 and lisinopril 5.    HLD: Cont atorvastatin  Normocytic anemia mild: Stable in  12gm  BPH: Cont Flomax    Hypothyroidism Cont synthroid   History of throat cancer   Cataracts Cont eye drops  Constipation:  Miralax daily and PR dulcolax prn   Consults: Orthopedics, infectious disease Subjective: Alert oriented resting comfortably, pain is controlled  Discharge Exam: Vitals:   03/20/23 0510 03/20/23 0744  BP: (!) 148/62 (!) 159/61  Pulse: 67 68  Resp: 18 17  Temp: 98.1 F (36.7 C) 98.3 F (36.8 C)  SpO2: 95% 96%   General: Pt is alert, awake, not in acute distress Cardiovascular: RRR, S1/S2 +, no rubs, no gallops Respiratory: CTA bilaterally, no wheezing, no rhonchi Abdominal: Soft, NT, ND, bowel sounds + Extremities: no edema, no cyanosis, left knee with dressing in place  Discharge Instructions  Discharge Instructions     [facility] pharmacist to adjust dose for Vancomycin, Aminoglycosides and other anti-infective therapies as requested by physician.   Complete by: As directed    [facility] to provide Cath Flo 2mg    Complete by: As directed    Administer for PICC line occlusion and as ordered by physician for other access device issues.   Anaphylaxis Kit: Provided to treat any anaphylactic reaction to the medication being provided to the patient if First Dose or when requested by physician   Complete by: As directed    Epinephrine 1mg /ml vial / amp: Administer 0.3mg  (0.86ml) subcutaneously once for moderate to severe anaphylaxis, nurse to call physician and pharmacy when reaction occurs and call 911 if needed for immediate care   Diphenhydramine 50mg /ml IV vial: Administer 25-50mg  IV/IM PRN for first dose reaction, rash, itching, mild reaction, nurse to call physician and pharmacy when reaction occurs   Sodium Chloride 0.9% NS IV: Administer if needed for hypovolemic blood pressure drop or as ordered by physician after call to physician with anaphylactic reaction   Change dressing on IV access line weekly and PRN    Complete by: As directed    Diet - low sodium heart healthy   Complete by: As directed    Discharge instructions   Complete by: As directed    Please call call MD or return to ER for similar or worsening recurring problem that brought you to hospital or if any fever,nausea/vomiting,abdominal pain, uncontrolled pain, chest pain,  shortness of breath or any other alarming symptoms.  Please follow-up your doctor as instructed in a week time and call the office for appointment.  Please avoid alcohol, smoking, or any other illicit substance and maintain healthy habits including taking your regular medications as prescribed.  You were cared for by a hospitalist during your hospital stay. If you have any questions about your discharge medications or the care you received while you were in the hospital after you are discharged, you can call the unit and ask to speak with the hospitalist on call if the hospitalist that took care of you is not available.  Once you are discharged, your primary care physician will handle any further medical issues. Please note that NO REFILLS for any discharge medications will be authorized once you are discharged, as  it is imperative that you return to your primary care physician (or establish a relationship with a primary care physician if you do not have one) for your aftercare needs so that they can reassess your need for medications and monitor your lab values   Flush IV access with Sodium Chloride 0.9% and Heparin 10 units/ml or 100 units/ml   Complete by: As directed    Home infusion instructions - [facility]   Complete by: As directed    Instructions: Flush IV access with Sodium Chloride 0.9% and Heparin 10units/ml or 100units/ml   Change dressing on IV access line: Weekly and PRN   Instructions Cath Flo 2mg : Administer for PICC Line occlusion and as ordered by physician for other access device   [facility] pharmacist to adjust dose for:  Vancomycin, Aminoglycosides and other anti-infective therapies as requested by physician   Increase activity slowly   Complete by: As directed    Method of administration may be changed at the discretion of home infusion pharmacist based upon assessment of the patient and/or caregiver's ability to self-administer the medication ordered   Complete by: As directed    No wound care   Complete by: As directed       Allergies as of 03/20/2023   No Known Allergies      Medication List     STOP taking these medications    predniSONE 20 MG tablet Commonly known as: DELTASONE       TAKE these medications    acetaminophen 500 MG tablet Commonly known as: TYLENOL Take 1,000 mg by mouth 2 (two) times daily as needed for mild pain or moderate pain.   allopurinol 100 MG tablet Commonly known as: ZYLOPRIM Take 1 tablet (100 mg total) by mouth daily.   amLODipine 10 MG tablet Commonly known as: NORVASC Take 10 mg by mouth daily.   ascorbic acid 500 MG tablet Commonly known as: VITAMIN C Take 1,000 mg by mouth daily.   aspirin EC 325 MG tablet Take 325 mg by mouth daily.   atorvastatin 40 MG tablet Commonly known as: LIPITOR Take 40 mg by mouth every evening.   brimonidine 0.2 % ophthalmic solution Commonly known as: ALPHAGAN Place 1 drop into the right eye 2 (two) times daily.   ceFAZolin IVPB Commonly known as: ANCEF Inject 2 g into the vein every 8 (eight) hours. Indication:  MSSA septic knee First Dose: Yes Last Day of Therapy:  04/28/23 Labs - Once weekly:  CBC/D and BMP, ESR and CRP Method of administration: IV Push Pull PICC line at the completion of IV antibiotic therapy Method of administration may be changed at the discretion of home infusion pharmacist based upon assessment of the patient and/or caregiver's ability to self-administer the medication ordered.   docusate sodium 100 MG capsule Commonly known as: COLACE Take 1 capsule (100 mg total) by mouth 2  (two) times daily.   Fish Oil 1200 MG Caps Take 1,200 mg by mouth daily.   fluticasone 50 MCG/ACT nasal spray Commonly known as: FLONASE Place 2 sprays into the nose daily as needed for allergies.   HYDROcodone-acetaminophen 5-325 MG tablet Commonly known as: Norco Take 1 tablet by mouth every 6 (six) hours as needed for up to 5 days for severe pain.   IRON COMPLEX PO Take 1 tablet by mouth daily.   levothyroxine 100 MCG tablet Commonly known as: SYNTHROID Take 100 mcg by mouth daily.   lisinopril 5 MG tablet Commonly known  as: ZESTRIL Take 5 mg by mouth 2 (two) times daily.   ONE-A-DAY 50 PLUS PO Take 1 tablet by mouth daily.   polyethylene glycol powder 17 GM/SCOOP powder Commonly known as: GLYCOLAX/MIRALAX Mix as directed and take 1 capful (17 g) by mouth daily as needed for mild constipation.   senna-docusate 8.6-50 MG tablet Commonly known as: Senokot-S Take 1 tablet by mouth at bedtime as needed for mild constipation.   tamsulosin 0.4 MG Caps capsule Commonly known as: FLOMAX Take 0.4 mg by mouth daily.               Durable Medical Equipment  (From admission, onward)           Start     Ordered   03/18/23 1348  For home use only DME 3 n 1  Once        03/18/23 1347   03/18/23 1348  For home use only DME Tub bench  Once        03/18/23 1347              Discharge Care Instructions  (From admission, onward)           Start     Ordered   03/20/23 0000  Change dressing on IV access line weekly and PRN  (Home infusion instructions - [facility] )        03/20/23 1050            Follow-up Information     Haddix, Gillie Manners, MD. Go on 03/26/2023.   Specialty: Orthopedic Surgery Why: 03/26/23 at 10am for wound check and suture removal Contact information: [address] [place] [address] [phone]         [facility] Follow up.   Specialty: Home Health Services Contact information: [address] [place] [address] [phone]         [facility] Follow up.   Why: [phone]        Cordial, Harlan Stains, MD Follow up in 1 week(s).   Contact information: [address] [place] [address] [phone]                No Known Allergies  The results of significant diagnostics from this hospitalization (including imaging, microbiology, ancillary and laboratory) are listed below for reference.    Microbiology: Recent Results (from the past 240 hour(s))  Body fluid culture w Gram Stain     Status: None   Collection Time: 03/11/23  8:39 AM   Specimen: Synovium; Body Fluid  Result Value Ref Range Status   Specimen Description SYNOVIAL  Final   Special Requests LEFT KNEE  Final   Gram Stain   Final    MODERATE WBC PRESENT, PREDOMINANTLY PMN NO ORGANISMS SEEN Performed at [facility], [address] [place], [place] [address]    Culture FEW STAPHYLOCOCCUS AUREUS  Final   Report Status 03/14/2023 FINAL  Final   Organism ID, Bacteria STAPHYLOCOCCUS AUREUS  Final      Susceptibility   Staphylococcus aureus - MIC*    CIPROFLOXACIN 1 SENSITIVE Sensitive     ERYTHROMYCIN <=0.25 SENSITIVE Sensitive     GENTAMICIN <=0.5 SENSITIVE Sensitive     OXACILLIN <=0.25 SENSITIVE Sensitive     TETRACYCLINE <=1 SENSITIVE Sensitive     VANCOMYCIN <=0.5 SENSITIVE Sensitive     TRIMETH/SULFA <=10 SENSITIVE Sensitive     CLINDAMYCIN <=0.25 SENSITIVE Sensitive  RIFAMPIN <=0.5 SENSITIVE Sensitive     Inducible Clindamycin NEGATIVE Sensitive     LINEZOLID 2 SENSITIVE Sensitive     * FEW STAPHYLOCOCCUS AUREUS  Blood culture (routine x 2)     Status: Abnormal   Collection Time: 03/14/23  9:39 PM   Specimen: BLOOD  Result Value Ref Range Status   Specimen Description BLOOD RIGHT ANTECUBITAL  Final   Special Requests   Final    BOTTLES DRAWN AEROBIC AND ANAEROBIC Blood Culture results may not be optimal due to an excessive volume of blood  received in culture bottles   Culture  Setup Time   Final    GRAM POSITIVE COCCI IN CLUSTERS IN BOTH AEROBIC AND ANAEROBIC BOTTLES CRITICAL RESULT CALLED TO, READ BACK BY AND VERIFIED WITH: PHARMD [id number] TO SAVANNAH MOODY, ADC Performed at [facility], [address], [place], [place] [address]    Culture STAPHYLOCOCCUS AUREUS (A)  Final   Report Status 03/17/2023 FINAL  Final   Organism ID, Bacteria STAPHYLOCOCCUS AUREUS  Final      Susceptibility   Staphylococcus aureus - MIC*    CIPROFLOXACIN 1 SENSITIVE Sensitive     ERYTHROMYCIN <=0.25 SENSITIVE Sensitive     GENTAMICIN <=0.5 SENSITIVE Sensitive     OXACILLIN <=0.25 SENSITIVE Sensitive     TETRACYCLINE <=1 SENSITIVE Sensitive     VANCOMYCIN <=0.5 SENSITIVE Sensitive     TRIMETH/SULFA <=10 SENSITIVE Sensitive     CLINDAMYCIN <=0.25 SENSITIVE Sensitive     RIFAMPIN <=0.5 SENSITIVE Sensitive     Inducible Clindamycin NEGATIVE Sensitive     LINEZOLID 2 SENSITIVE Sensitive     * STAPHYLOCOCCUS AUREUS  Body fluid culture w Gram Stain     Status: None   Collection Time: 03/14/23  9:39 PM   Specimen: Joint, Knee; Body Fluid  Result Value Ref Range Status   Specimen Description JOINT FLUID KNEE  Final   Special Requests NONE  Final   Gram Stain   Final    MODERATE WBC PRESENT,BOTH PMN AND MONONUCLEAR RARE GRAM POSITIVE COCCI Performed at [facility], [address], [place], [place] [address]    Culture RARE STAPHYLOCOCCUS AUREUS  Final   Report Status 03/17/2023 FINAL  Final   Organism ID, Bacteria STAPHYLOCOCCUS AUREUS  Final      Susceptibility   Staphylococcus aureus - MIC*    CIPROFLOXACIN 1 SENSITIVE Sensitive     ERYTHROMYCIN <=0.25 SENSITIVE Sensitive     GENTAMICIN <=0.5 SENSITIVE Sensitive     OXACILLIN <=0.25 SENSITIVE Sensitive     TETRACYCLINE <=1 SENSITIVE Sensitive     VANCOMYCIN <=0.5 SENSITIVE Sensitive     TRIMETH/SULFA <=10 SENSITIVE Sensitive     CLINDAMYCIN <=0.25 SENSITIVE Sensitive      RIFAMPIN <=0.5 SENSITIVE Sensitive     Inducible Clindamycin NEGATIVE Sensitive     LINEZOLID 2 SENSITIVE Sensitive     * RARE STAPHYLOCOCCUS AUREUS  Blood Culture ID Panel (Reflexed)     Status: Abnormal   Collection Time: 03/14/23  9:39 PM  Result Value Ref Range Status   Enterococcus faecalis NOT DETECTED NOT DETECTED Final   Enterococcus Faecium NOT DETECTED NOT DETECTED Final   Listeria monocytogenes NOT DETECTED NOT DETECTED Final   Staphylococcus species DETECTED (A) NOT DETECTED Final    Comment: CRITICAL RESULT CALLED TO, READ BACK BY AND VERIFIED WITH: PHARMD SAVANNAH MOODY [id number], ADC    Staphylococcus aureus (BCID) DETECTED (A) NOT DETECTED Final    Comment:  CRITICAL RESULT CALLED TO, READ BACK BY AND VERIFIED WITH: PHARMD SAVANNAH MOODY [id number] 1502, ADC    Staphylococcus epidermidis NOT DETECTED NOT DETECTED Final   Staphylococcus lugdunensis NOT DETECTED NOT DETECTED Final   Streptococcus species NOT DETECTED NOT DETECTED Final   Streptococcus agalactiae NOT DETECTED NOT DETECTED Final   Streptococcus pneumoniae NOT DETECTED NOT DETECTED Final   Streptococcus pyogenes NOT DETECTED NOT DETECTED Final   A.calcoaceticus-baumannii NOT DETECTED NOT DETECTED Final   Bacteroides fragilis NOT DETECTED NOT DETECTED Final   Enterobacterales NOT DETECTED NOT DETECTED Final   Enterobacter cloacae complex NOT DETECTED NOT DETECTED Final   Escherichia coli NOT DETECTED NOT DETECTED Final   Klebsiella aerogenes NOT DETECTED NOT DETECTED Final   Klebsiella oxytoca NOT DETECTED NOT DETECTED Final   Klebsiella pneumoniae NOT DETECTED NOT DETECTED Final   Proteus species NOT DETECTED NOT DETECTED Final   Salmonella species NOT DETECTED NOT DETECTED Final   Serratia marcescens NOT DETECTED NOT DETECTED Final   Haemophilus influenzae NOT DETECTED NOT DETECTED Final   Neisseria meningitidis NOT DETECTED NOT DETECTED Final   Pseudomonas aeruginosa NOT DETECTED NOT DETECTED Final    Stenotrophomonas maltophilia NOT DETECTED NOT DETECTED Final   Candida albicans NOT DETECTED NOT DETECTED Final   Candida auris NOT DETECTED NOT DETECTED Final   Candida glabrata NOT DETECTED NOT DETECTED Final   Candida krusei NOT DETECTED NOT DETECTED Final   Candida parapsilosis NOT DETECTED NOT DETECTED Final   Candida tropicalis NOT DETECTED NOT DETECTED Final   Cryptococcus neoformans/gattii NOT DETECTED NOT DETECTED Final   Meth resistant mecA/C and MREJ NOT DETECTED NOT DETECTED Final    Comment: Performed at [facility], [address] [place], [place] [address]  Blood culture (routine x 2)     Status: None   Collection Time: 03/15/23 11:08 AM   Specimen: BLOOD LEFT ARM  Result Value Ref Range Status   Specimen Description BLOOD LEFT ARM  Final   Special Requests   Final    BOTTLES DRAWN AEROBIC AND ANAEROBIC Blood Culture results may not be optimal due to an inadequate volume of blood received in culture bottles   Culture   Final    NO GROWTH 5 DAYS Performed at [facility], [address] [place], [place] [address]    Report Status 03/20/2023 FINAL  Final  Surgical pcr screen     Status: Abnormal   Collection Time: 03/15/23 11:45 AM   Specimen: Nasal Mucosa; Nasal Swab  Result Value Ref Range Status   MRSA, PCR (A) NEGATIVE Corrected    INVALID, UNABLE TO DETERMINE THE PRESENCE OF TARGET DUE TO SPECIMEN INTEGRITY. RECOLLECTION REQUESTED.   Staphylococcus aureus (A) NEGATIVE Corrected    INVALID, UNABLE TO DETERMINE THE PRESENCE OF TARGET DUE TO SPECIMEN INTEGRITY. RECOLLECTION REQUESTED.    Comment: NOTIFIED RN Koleen Nimrod D ON 03/15/23 @ 1547 BY DRT Performed at [facility], [address], [place], [place] [address] CORRECTED ON 08/09 AT 1547: PREVIOUSLY REPORTED AS INVALID, UNABLE TO DETERMINE THE PRESENCE OF TARGET DUE TO SPECIMEN INTEGRITY. RECOLLECTION REQUESTED.   Fungus Culture With Stain     Status: None (Preliminary result)   Collection Time:  03/15/23 12:48 PM   Specimen: Soft Tissue, Other  Result Value Ref Range Status   Fungus Stain Final report  Final    Comment: (NOTE) Performed At: [facility] [address] [place], [place] [id number] Jolene Schimke MD VH:[id number]    Fungus (Mycology)  Culture PENDING  Incomplete   Fungal Source TISSUE  Final    Comment: Performed at [facility], [address], [place], [place] [address]  Aerobic/Anaerobic Culture w Gram Stain (surgical/deep wound)     Status: None   Collection Time: 03/15/23 12:48 PM   Specimen: Soft Tissue, Other  Result Value Ref Range Status   Specimen Description TISSUE  Final   Special Requests left knee  Final   Gram Stain   Final    ABUNDANT WBC PRESENT, PREDOMINANTLY PMN RARE GRAM POSITIVE COCCI IN PAIRS    Culture   Final    RARE STAPHYLOCOCCUS AUREUS NO ANAEROBES ISOLATED Performed at [facility], [address], [place], [place] [address]    Report Status 03/20/2023 FINAL  Final   Organism ID, Bacteria STAPHYLOCOCCUS AUREUS  Final      Susceptibility   Staphylococcus aureus - MIC*    CIPROFLOXACIN 1 SENSITIVE Sensitive     ERYTHROMYCIN <=0.25 SENSITIVE Sensitive     GENTAMICIN <=0.5 SENSITIVE Sensitive     OXACILLIN <=0.25 SENSITIVE Sensitive     TETRACYCLINE <=1 SENSITIVE Sensitive     VANCOMYCIN <=0.5 SENSITIVE Sensitive     TRIMETH/SULFA <=10 SENSITIVE Sensitive     CLINDAMYCIN <=0.25 SENSITIVE Sensitive     RIFAMPIN <=0.5 SENSITIVE Sensitive     Inducible Clindamycin NEGATIVE Sensitive     LINEZOLID 2 SENSITIVE Sensitive     * RARE STAPHYLOCOCCUS AUREUS  Fungus Culture Result     Status: None   Collection Time: 03/15/23 12:48 PM  Result Value Ref Range Status   Result 1 Comment  Final    Comment: (NOTE) KOH/Calcofluor preparation:  no fungus observed. Performed At: [facility] [address] [place], [place] [id number] Jolene Schimke MD WN:[id number]   Culture, blood (Routine X 2) w Reflex to ID  Panel     Status: None (Preliminary result)   Collection Time: 03/17/23 11:40 AM   Specimen: BLOOD LEFT HAND  Result Value Ref Range Status   Specimen Description BLOOD LEFT HAND  Final   Special Requests   Final    BOTTLES DRAWN AEROBIC AND ANAEROBIC Blood Culture adequate volume   Culture   Final    NO GROWTH 3 DAYS Performed at [facility], [address] [place], [place] [address]    Report Status PENDING  Incomplete  Culture, blood (Routine X 2) w Reflex to ID Panel     Status: None (Preliminary result)   Collection Time: 03/17/23 11:49 AM   Specimen: BLOOD RIGHT HAND  Result Value Ref Range Status   Specimen Description BLOOD RIGHT HAND  Final   Special Requests   Final    BOTTLES DRAWN AEROBIC AND ANAEROBIC Blood Culture adequate volume   Culture   Final    NO GROWTH 3 DAYS Performed at [facility], [address], [place], [place] [address]    Report Status PENDING  Incomplete    Procedures/Studies: [place] EKG SITE RITE  Result Date: 03/20/2023 If Site Rite image not attached, placement could not be confirmed due to current cardiac rhythm.  ECHOCARDIOGRAM COMPLETE  Result Date: 03/17/2023    ECHOCARDIOGRAM REPORT   Patient Name:   HELTON BONSER Date of Exam: 03/17/2023 Medical Rec #:  [id number]         Height:       76.0 in Accession #:    [id number]        Weight:  238.1 lb Date of Birth:  [birth year]         BSA:          2.386 m Patient Age:    82 years          BP:           144/67 mmHg Patient Gender: M                 HR:           72 bpm. Exam Location:  Inpatient Procedure: 2D Echo, Cardiac Doppler and Color Doppler Indications:    murmur  History:        Patient has prior history of Echocardiogram examinations, most                 recent 12/02/2022. Risk Factors:Hypertension and Dyslipidemia.  Sonographer:    Delcie Roch RDCS Referring Phys: [id number]    Sonographer Comments: Could not turn on side due to recent surgery. IMPRESSIONS  1.  Left ventricular ejection fraction, by estimation, is 65 to 70%. The left ventricle has normal function. The left ventricle has no regional wall motion abnormalities. There is mild concentric left ventricular hypertrophy. Left ventricular diastolic parameters are indeterminate.  2. Right ventricular systolic function is normal. The right ventricular size is normal. Tricuspid regurgitation signal is inadequate for assessing PA pressure.  3. The mitral valve is normal in structure. No evidence of mitral valve regurgitation. No evidence of mitral stenosis.  4. The aortic valve has an indeterminant number of cusps. Aortic valve regurgitation is not visualized. No aortic stenosis is present.  5. There is mild dilatation of the aortic root, measuring 39 mm.  6. The inferior vena cava is normal in size with greater than 50% respiratory variability, suggesting right atrial pressure of 3 mmHg. FINDINGS  Left Ventricle: Left ventricular ejection fraction, by estimation, is 65 to 70%. The left ventricle has normal function. The left ventricle has no regional wall motion abnormalities. The left ventricular internal cavity size was normal in size. There is  mild concentric left ventricular hypertrophy. Left ventricular diastolic parameters are indeterminate. Right Ventricle: The right ventricular size is normal. No increase in right ventricular wall thickness. Right ventricular systolic function is normal. Tricuspid regurgitation signal is inadequate for assessing PA pressure. Left Atrium: Left atrial size was normal in size. Right Atrium: Right atrial size was normal in size. Pericardium: There is no evidence of pericardial effusion. Presence of epicardial fat layer. Mitral Valve: The mitral valve is normal in structure. No evidence of mitral valve regurgitation. No evidence of mitral valve stenosis. Tricuspid Valve: The tricuspid valve is normal in structure. Tricuspid valve regurgitation is not demonstrated. No evidence of  tricuspid stenosis. Aortic Valve: The aortic valve has an indeterminant number of cusps. Aortic valve regurgitation is not visualized. No aortic stenosis is present. Pulmonic Valve: The pulmonic valve was not well visualized. Pulmonic valve regurgitation is not visualized. No evidence of pulmonic stenosis. Aorta: There is mild dilatation of the aortic root, measuring 39 mm. Venous: The inferior vena cava is normal in size with greater than 50% respiratory variability, suggesting right atrial pressure of 3 mmHg. IAS/Shunts: No atrial level shunt detected by color flow Doppler.  LEFT VENTRICLE PLAX 2D LVIDd:         4.70 cm   Diastology LVIDs:         2.00 cm   LV e' medial:    7.72 cm/s LV PW:  1.10 cm   LV E/e' medial:  11.1 LV IVS:        1.10 cm   LV e' lateral:   10.00 cm/s LVOT diam:     1.90 cm   LV E/e' lateral: 8.6 LV SV:         77 LV SV Index:   32 LVOT Area:     2.84 cm  RIGHT VENTRICLE             IVC RV Basal diam:  3.20 cm     IVC diam: 2.00 cm RV S prime:     16.90 cm/s TAPSE (M-mode): 1.7 cm LEFT ATRIUM           Index        RIGHT ATRIUM           Index LA diam:      3.80 cm 1.59 cm/m   RA Area:     12.70 cm LA Vol (A4C): 51.4 ml 21.55 ml/m  RA Volume:   27.50 ml  11.53 ml/m  AORTIC VALVE LVOT Vmax:   136.00 cm/s LVOT Vmean:  90.500 cm/s LVOT VTI:    0.272 m  AORTA Ao Root diam: 3.90 cm MITRAL VALVE MV Area (PHT): 3.03 cm    SHUNTS MV Decel Time: 250 msec    Systemic VTI:  0.27 m MV E velocity: 85.90 cm/s  Systemic Diam: 1.90 cm MV A velocity: 81.50 cm/s MV E/A ratio:  1.05 Kardie Tobb DO Electronically signed by Thomasene Ripple DO Signature Date/Time: 03/17/2023/11:08:35 AM    Final    MR THORACIC SPINE WO CONTRAST  Result Date: 03/16/2023 CLINICAL DATA:  Mid back pain with infection suspected EXAM: MRI THORACIC AND LUMBAR SPINE WITHOUT CONTRAST TECHNIQUE: Multiplanar and multiecho pulse sequences of the thoracic and lumbar spine were obtained without intravenous contrast. COMPARISON:   None Available. FINDINGS: MRI THORACIC SPINE FINDINGS Alignment:  Physiologic. Vertebrae: No fracture, evidence of discitis, or bone lesion. Schmorl's nodes at the opposing endplates at T7-8 and at the superior endplates of T9 and T10. T10 hemangioma. Cord:  Normal Paraspinal and other soft tissues: Normal Disc levels: No spinal canal stenosis.  No neural impingement. MRI LUMBAR SPINE FINDINGS Segmentation:  Standard. Alignment:  Physiologic. Vertebrae:  No fracture, evidence of discitis, or bone lesion. Conus medullaris and cauda equina: Conus extends to the L1 level. Conus and cauda equina appear normal. Paraspinal and other soft tissues: Negative Disc levels: L1-L2: Normal disc space and facet joints. No spinal canal stenosis. No neural foraminal stenosis. L2-L3: Normal disc space and facet joints. No spinal canal stenosis. No neural foraminal stenosis. L3-L4: Disc space narrowing with intermediate sized right asymmetric bulge. Right lateral recess narrowing without central spinal canal stenosis. Mild right neural foraminal stenosis. L4-L5: Intermediate sized disc bulge with mild facet hypertrophy. Left lateral recess narrowing without central spinal canal stenosis. Moderate left neural foraminal stenosis. L5-S1: Left asymmetric disc bulge with moderate facet hypertrophy. No spinal canal stenosis. Mild right and moderate left neural foraminal stenosis. Visualized sacrum: Normal. IMPRESSION: 1. No acute abnormality of the thoracic or lumbar spine. 2. 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 3. Right lateral recess narrowing at L3-4 and left lateral recess narrowing at L4-5. Correlate for right L4 and/or left L5 radiculopathy. 4. 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 Electronically Signed   By: Deatra Robinson M.D.   On: 03/16/2023 21:36   MR LUMBAR SPINE WO CONTRAST  Result Date: 03/16/2023 CLINICAL DATA:  Mid back pain with infection suspected  EXAM: MRI THORACIC AND LUMBAR SPINE WITHOUT CONTRAST TECHNIQUE: Multiplanar and multiecho pulse sequences of the thoracic and lumbar spine were obtained without intravenous contrast. COMPARISON:  None Available. FINDINGS: MRI THORACIC SPINE FINDINGS Alignment:  Physiologic. Vertebrae: No fracture, evidence of discitis, or bone lesion. Schmorl's nodes at the opposing endplates at T7-8 and at the superior endplates of T9 and T10. T10 hemangioma. Cord:  Normal Paraspinal and other soft tissues: Normal Disc levels: No spinal canal stenosis.  No neural impingement. MRI LUMBAR SPINE FINDINGS Segmentation:  Standard. Alignment:  Physiologic. Vertebrae:  No fracture, evidence of discitis, or bone lesion. Conus medullaris and cauda equina: Conus extends to the L1 level. Conus and cauda equina appear normal. Paraspinal and other soft tissues: Negative Disc levels: L1-L2: Normal disc space and facet joints. No spinal canal stenosis. No neural foraminal stenosis. L2-L3: Normal disc space and facet joints. No spinal canal stenosis. No neural foraminal stenosis. L3-L4: Disc space narrowing with intermediate sized right asymmetric bulge. Right lateral recess narrowing without central spinal canal stenosis. Mild right neural foraminal stenosis. L4-L5: Intermediate sized disc bulge with mild facet hypertrophy. Left lateral recess narrowing without central spinal canal stenosis. Moderate left neural foraminal stenosis. L5-S1: Left asymmetric disc bulge with moderate facet hypertrophy. No spinal canal stenosis. Mild right and moderate left neural foraminal stenosis. Visualized sacrum: Normal. IMPRESSION: 1. No acute abnormality of the thoracic or lumbar spine. 2. 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 3. Right lateral recess narrowing at L3-4 and left lateral recess narrowing at L4-5. Correlate for right L4 and/or left L5 radiculopathy. 4. Mild thoracic degenerative disc disease without spinal canal or neural  foraminal stenosis. Electronically Signed   By: Deatra Robinson M.D.   On: 03/16/2023 21:36   [place] EKG SITE RITE  Result Date: 03/15/2023 If Site Rite image not attached, placement could not be confirmed due to current cardiac rhythm.  DG Knee Complete 4 Views Left  Result Date: 03/11/2023 CLINICAL DATA:  Left knee pain. EXAM: LEFT KNEE - COMPLETE 4+ VIEW COMPARISON:  None Available. FINDINGS: No evidence of fracture, dislocation, or joint effusion. No significant joint space narrowing is noted. Minimal osteophyte formation is noted laterally. Soft tissues are unremarkable. IMPRESSION: Minimal degenerative changes as described above. No acute abnormality seen. Electronically Signed   By: Lupita Raider M.D.   On: 03/11/2023 09:32    Labs: BNP (last 3 results) Recent Labs    12/01/22 1856  BNP 147.5*   Basic Metabolic Panel: Recent Labs  Lab 03/16/23 0112 03/17/23 1216 03/18/23 0014 03/19/23 0303 03/20/23 0607  NA 132* 136 133* 133* 135  K 4.1 3.6 3.7 3.9 3.6  CL 98 100 101 100 102  CO2 23 24 24 25 24   GLUCOSE 175* 158* 115* 120* 102*  BUN 18 12 13 12 9   CREATININE 0.90 0.78 0.76 0.81 0.69  CALCIUM 8.3* 8.3* 8.1* 8.1* 8.4*   Liver Function Tests: Recent Labs  Lab 03/16/23 0112 03/17/23 1216 03/18/23 0014 03/19/23 0303 03/20/23 0607  AST 22 26 23 23 30   ALT 31 19 14 11 13   ALKPHOS 105 101 96 97 111  BILITOT 0.2* 0.2* 0.3 0.4 0.3  PROT 5.2* 5.1* 5.2* 5.2* 5.9*  ALBUMIN 1.6* 1.7* 1.7* 1.7* 1.9*   No results for input(s): "LIPASE", "AMYLASE" in the last 168 hours. No results for input(s): "AMMONIA" in the last 168 hours. CBC: Recent Labs  Lab 03/15/23 0359 03/16/23 0112 03/17/23 0154 03/18/23 0014 03/19/23 0303 03/20/23 0607  WBC 8.6 9.6 8.7 7.0 6.8 6.5  NEUTROABS 7.1  --   --   --   --   --   HGB 12.2* 11.5* 11.4* 11.3* 10.9* 12.3*  HCT 37.3* 34.8* 36.0* 35.3* 34.1* 38.3*  MCV 87.1 87.7 90.5 87.4 90.2 88.2  PLT 357 335 376 344 297 291   No results for  input(s): "VITAMINB12", "FOLATE", "FERRITIN", "TIBC", "IRON", "RETICCTPCT" in the last 72 hours. Urinalysis    Component Value Date/Time   COLORURINE YELLOW 02/20/2023 1043   APPEARANCEUR CLEAR 02/20/2023 1043   LABSPEC 1.013 02/20/2023 1043   PHURINE 6.0 02/20/2023 1043   GLUCOSEU NEGATIVE 02/20/2023 1043   HGBUR NEGATIVE 02/20/2023 1043   BILIRUBINUR NEGATIVE 01/29/2023 2209   KETONESUR NEGATIVE 02/20/2023 1043   PROTEINUR TRACE (A) 02/20/2023 1043   UROBILINOGEN 1.0 01/06/2012 1132   NITRITE NEGATIVE 02/20/2023 1043   LEUKOCYTESUR 3+ (A) 02/20/2023 1043   Sepsis Labs Recent Labs  Lab 03/17/23 0154 03/18/23 0014 03/19/23 0303 03/20/23 0607  WBC 8.7 7.0 6.8 6.5   Microbiology Recent Results (from the past 240 hour(s))  Body fluid culture w Gram Stain     Status: None   Collection Time: 03/11/23  8:39 AM   Specimen: Synovium; Body Fluid  Result Value Ref Range Status   Specimen Description SYNOVIAL  Final   Special Requests LEFT KNEE  Final   Gram Stain   Final    MODERATE WBC PRESENT, PREDOMINANTLY PMN NO ORGANISMS SEEN Performed at [facility], [address] [place], [place] [address]    Culture FEW STAPHYLOCOCCUS AUREUS  Final   Report Status 03/14/2023 FINAL  Final   Organism ID, Bacteria STAPHYLOCOCCUS AUREUS  Final      Susceptibility   Staphylococcus aureus - MIC*    CIPROFLOXACIN 1 SENSITIVE Sensitive     ERYTHROMYCIN <=0.25 SENSITIVE Sensitive     GENTAMICIN <=0.5 SENSITIVE Sensitive     OXACILLIN <=0.25 SENSITIVE Sensitive     TETRACYCLINE <=1 SENSITIVE Sensitive     VANCOMYCIN <=0.5 SENSITIVE Sensitive     TRIMETH/SULFA <=10 SENSITIVE Sensitive     CLINDAMYCIN <=0.25 SENSITIVE Sensitive     RIFAMPIN <=0.5 SENSITIVE Sensitive     Inducible Clindamycin NEGATIVE Sensitive     LINEZOLID 2 SENSITIVE Sensitive     * FEW STAPHYLOCOCCUS AUREUS  Blood culture (routine x 2)     Status: Abnormal   Collection Time: 03/14/23  9:39 PM   Specimen: BLOOD   Result Value Ref Range Status   Specimen Description BLOOD RIGHT ANTECUBITAL  Final   Special Requests   Final    BOTTLES DRAWN AEROBIC AND ANAEROBIC Blood Culture results may not be optimal due to an excessive volume of blood received in culture bottles   Culture  Setup Time   Final    GRAM POSITIVE COCCI IN CLUSTERS IN BOTH AEROBIC AND ANAEROBIC BOTTLES CRITICAL RESULT CALLED TO, READ BACK BY AND VERIFIED WITH: PHARMD [id number] TO SAVANNAH MOODY, ADC Performed at [facility], [address], [place], [place] [address]    Culture STAPHYLOCOCCUS AUREUS (A)  Final   Report Status 03/17/2023 FINAL  Final   Organism ID, Bacteria STAPHYLOCOCCUS AUREUS  Final      Susceptibility   Staphylococcus aureus - MIC*    CIPROFLOXACIN 1 SENSITIVE Sensitive     ERYTHROMYCIN <=0.25 SENSITIVE Sensitive     GENTAMICIN <=0.5 SENSITIVE  Sensitive     OXACILLIN <=0.25 SENSITIVE Sensitive     TETRACYCLINE <=1 SENSITIVE Sensitive     VANCOMYCIN <=0.5 SENSITIVE Sensitive     TRIMETH/SULFA <=10 SENSITIVE Sensitive     CLINDAMYCIN <=0.25 SENSITIVE Sensitive     RIFAMPIN <=0.5 SENSITIVE Sensitive     Inducible Clindamycin NEGATIVE Sensitive     LINEZOLID 2 SENSITIVE Sensitive     * STAPHYLOCOCCUS AUREUS  Body fluid culture w Gram Stain     Status: None   Collection Time: 03/14/23  9:39 PM   Specimen: Joint, Knee; Body Fluid  Result Value Ref Range Status   Specimen Description JOINT FLUID KNEE  Final   Special Requests NONE  Final   Gram Stain   Final    MODERATE WBC PRESENT,BOTH PMN AND MONONUCLEAR RARE GRAM POSITIVE COCCI Performed at [facility], [address], [place], [place] [address]    Culture RARE STAPHYLOCOCCUS AUREUS  Final   Report Status 03/17/2023 FINAL  Final   Organism ID, Bacteria STAPHYLOCOCCUS AUREUS  Final      Susceptibility   Staphylococcus aureus - MIC*    CIPROFLOXACIN 1 SENSITIVE Sensitive     ERYTHROMYCIN <=0.25 SENSITIVE Sensitive     GENTAMICIN <=0.5  SENSITIVE Sensitive     OXACILLIN <=0.25 SENSITIVE Sensitive     TETRACYCLINE <=1 SENSITIVE Sensitive     VANCOMYCIN <=0.5 SENSITIVE Sensitive     TRIMETH/SULFA <=10 SENSITIVE Sensitive     CLINDAMYCIN <=0.25 SENSITIVE Sensitive     RIFAMPIN <=0.5 SENSITIVE Sensitive     Inducible Clindamycin NEGATIVE Sensitive     LINEZOLID 2 SENSITIVE Sensitive     * RARE STAPHYLOCOCCUS AUREUS  Blood Culture ID Panel (Reflexed)     Status: Abnormal   Collection Time: 03/14/23  9:39 PM  Result Value Ref Range Status   Enterococcus faecalis NOT DETECTED NOT DETECTED Final   Enterococcus Faecium NOT DETECTED NOT DETECTED Final   Listeria monocytogenes NOT DETECTED NOT DETECTED Final   Staphylococcus species DETECTED (A) NOT DETECTED Final    Comment: CRITICAL RESULT CALLED TO, READ BACK BY AND VERIFIED WITH: PHARMD SAVANNAH MOODY [id number], ADC    Staphylococcus aureus (BCID) DETECTED (A) NOT DETECTED Final    Comment: CRITICAL RESULT CALLED TO, READ BACK BY AND VERIFIED WITH: PHARMD SAVANNAH MOODY [id number], ADC    Staphylococcus epidermidis NOT DETECTED NOT DETECTED Final   Staphylococcus lugdunensis NOT DETECTED NOT DETECTED Final   Streptococcus species NOT DETECTED NOT DETECTED Final   Streptococcus agalactiae NOT DETECTED NOT DETECTED Final   Streptococcus pneumoniae NOT DETECTED NOT DETECTED Final   Streptococcus pyogenes NOT DETECTED NOT DETECTED Final   A.calcoaceticus-baumannii NOT DETECTED NOT DETECTED Final   Bacteroides fragilis NOT DETECTED NOT DETECTED Final   Enterobacterales NOT DETECTED NOT DETECTED Final   Enterobacter cloacae complex NOT DETECTED NOT DETECTED Final   Escherichia coli NOT DETECTED NOT DETECTED Final   Klebsiella aerogenes NOT DETECTED NOT DETECTED Final   Klebsiella oxytoca NOT DETECTED NOT DETECTED Final   Klebsiella pneumoniae NOT DETECTED NOT DETECTED Final   Proteus species NOT DETECTED NOT DETECTED Final   Salmonella species NOT DETECTED NOT DETECTED  Final   Serratia marcescens NOT DETECTED NOT DETECTED Final   Haemophilus influenzae NOT DETECTED NOT DETECTED Final   Neisseria meningitidis NOT DETECTED NOT DETECTED Final   Pseudomonas aeruginosa NOT DETECTED NOT DETECTED Final   Stenotrophomonas maltophilia NOT DETECTED NOT DETECTED Final   Candida albicans NOT DETECTED NOT DETECTED  Final   Candida auris NOT DETECTED NOT DETECTED Final   Candida glabrata NOT DETECTED NOT DETECTED Final   Candida krusei NOT DETECTED NOT DETECTED Final   Candida parapsilosis NOT DETECTED NOT DETECTED Final   Candida tropicalis NOT DETECTED NOT DETECTED Final   Cryptococcus neoformans/gattii NOT DETECTED NOT DETECTED Final   Meth resistant mecA/C and MREJ NOT DETECTED NOT DETECTED Final    Comment: Performed at [facility], [address], [place], [place] [address]  Blood culture (routine x 2)     Status: None   Collection Time: 03/15/23 11:08 AM   Specimen: BLOOD LEFT ARM  Result Value Ref Range Status   Specimen Description BLOOD LEFT ARM  Final   Special Requests   Final    BOTTLES DRAWN AEROBIC AND ANAEROBIC Blood Culture results may not be optimal due to an inadequate volume of blood received in culture bottles   Culture   Final    NO GROWTH 5 DAYS Performed at [facility], [address], [place], [place] [address]    Report Status 03/20/2023 FINAL  Final  Surgical pcr screen     Status: Abnormal   Collection Time: 03/15/23 11:45 AM   Specimen: Nasal Mucosa; Nasal Swab  Result Value Ref Range Status   MRSA, PCR (A) NEGATIVE Corrected    INVALID, UNABLE TO DETERMINE THE PRESENCE OF TARGET DUE TO SPECIMEN INTEGRITY. RECOLLECTION REQUESTED.   Staphylococcus aureus (A) NEGATIVE Corrected    INVALID, UNABLE TO DETERMINE THE PRESENCE OF TARGET DUE TO SPECIMEN INTEGRITY. RECOLLECTION REQUESTED.    Comment: NOTIFIED RN Koleen Nimrod D ON 03/15/23 @ 1547 BY DRT Performed at [facility], [address] [place], [place]  [address] CORRECTED ON 08/09 AT 1547: PREVIOUSLY REPORTED AS INVALID, UNABLE TO DETERMINE THE PRESENCE OF TARGET DUE TO SPECIMEN INTEGRITY. RECOLLECTION REQUESTED.   Fungus Culture With Stain     Status: None (Preliminary result)   Collection Time: 03/15/23 12:48 PM   Specimen: Soft Tissue, Other  Result Value Ref Range Status   Fungus Stain Final report  Final    Comment: (NOTE) Performed At: [facility] [address] [place], [place] [id number] Jolene Schimke MD QM:[id number]    Fungus (Mycology) Culture PENDING  Incomplete   Fungal Source TISSUE  Final    Comment: Performed at [facility], [address], [place], [place] [address]  Aerobic/Anaerobic Culture w Gram Stain (surgical/deep wound)     Status: None   Collection Time: 03/15/23 12:48 PM   Specimen: Soft Tissue, Other  Result Value Ref Range Status   Specimen Description TISSUE  Final   Special Requests left knee  Final   Gram Stain   Final    ABUNDANT WBC PRESENT, PREDOMINANTLY PMN RARE GRAM POSITIVE COCCI IN PAIRS    Culture   Final    RARE STAPHYLOCOCCUS AUREUS NO ANAEROBES ISOLATED Performed at [facility], [address], [place], [place] [address]    Report Status 03/20/2023 FINAL  Final   Organism ID, Bacteria STAPHYLOCOCCUS AUREUS  Final      Susceptibility   Staphylococcus aureus - MIC*    CIPROFLOXACIN 1 SENSITIVE Sensitive     ERYTHROMYCIN <=0.25 SENSITIVE Sensitive     GENTAMICIN <=0.5 SENSITIVE Sensitive     OXACILLIN <=0.25 SENSITIVE Sensitive     TETRACYCLINE <=1 SENSITIVE Sensitive     VANCOMYCIN <=0.5 SENSITIVE Sensitive     TRIMETH/SULFA <=10 SENSITIVE Sensitive  CLINDAMYCIN <=0.25 SENSITIVE Sensitive     RIFAMPIN <=0.5 SENSITIVE Sensitive     Inducible Clindamycin NEGATIVE Sensitive     LINEZOLID 2 SENSITIVE Sensitive     * RARE STAPHYLOCOCCUS AUREUS  Fungus Culture Result     Status: None   Collection Time: 03/15/23 12:48 PM  Result Value Ref Range Status   Result  1 Comment  Final    Comment: (NOTE) KOH/Calcofluor preparation:  no fungus observed. Performed At: [facility] [address] [place], [place] [id number] Jolene Schimke MD WU:[id number]   Culture, blood (Routine X 2) w Reflex to ID Panel     Status: None (Preliminary result)   Collection Time: 03/17/23 11:40 AM   Specimen: BLOOD LEFT HAND  Result Value Ref Range Status   Specimen Description BLOOD LEFT HAND  Final   Special Requests   Final    BOTTLES DRAWN AEROBIC AND ANAEROBIC Blood Culture adequate volume   Culture   Final    NO GROWTH 3 DAYS Performed at [facility], [address], [place], [place] [address]    Report Status PENDING  Incomplete  Culture, blood (Routine X 2) w Reflex to ID Panel     Status: None (Preliminary result)   Collection Time: 03/17/23 11:49 AM   Specimen: BLOOD RIGHT HAND  Result Value Ref Range Status   Specimen Description BLOOD RIGHT HAND  Final   Special Requests   Final    BOTTLES DRAWN AEROBIC AND ANAEROBIC Blood Culture adequate volume   Culture   Final    NO GROWTH 3 DAYS Performed at [facility], [address] [place], [place] [address]    Report Status PENDING  Incomplete     Time coordinating discharge: 35 minutes  SIGNED: Lanae Boast, MD  [facility] 03/20/2023, 2:11 PM  If 7PM-7AM, please contact night-coverage [email or web address]

## 2023-03-20 NOTE — Progress Notes (Signed)
Physical Therapy Treatment Patient Details Name: Dean Mayer MRN: [id number] DOB: 1941/03/06 Today's Date: 03/20/2023   History of Present Illness 82 y.o. male presenting with 2 week hx of L knee pain, found to have a septic L knee. He is now s/p I&D on 03/14/23 (day of admission). PMH of HTN, Throat and prostate CA.    PT Comments  Pt agreeable to stair training prior to discharge. Pt premedicated prior to session as when PT checked in on him earlier he had 10/10 pain in his L knee and foot. Provided education on pain management, with medication as well as movement to decrease stiffness and associated pain. Pt able to perform bed mobility, transfers and ambulation at contact guard level. Educated pt and wife on sequencing for stair ascent/descent and pt able to perform 1 step x2. Pt's wife reports son and grandson will be present to help pt into home. D/c plans remain appropriate at this time.     If plan is discharge home, recommend the following: A little help with walking and/or transfers;A little help with bathing/dressing/bathroom;Assist for transportation;Help with stairs or ramp for entrance;Assistance with cooking/housework   Can travel by private vehicle      yes  Equipment Recommendations  None recommended by PT       Precautions / Restrictions Precautions Precautions: Fall Restrictions Weight Bearing Restrictions: Yes LLE Weight Bearing: Weight bearing as tolerated     Mobility  Bed Mobility Overal bed mobility: Needs Assistance Bed Mobility: Supine to Sit       Sit to supine: Supervision   General bed mobility comments: increased time and effort with HoB elevated and use of bed rail    Transfers Overall transfer level: Needs assistance Equipment used: Rolling walker (2 wheels) Transfers: Sit to/from Stand Sit to Stand: From elevated surface, Contact guard assist           General transfer comment: increased forward flexion, time and effort, ultimately  able to come to standing and bring L LE underneath him    Ambulation/Gait Ambulation/Gait assistance: Min assist, Contact guard assist Gait Distance (Feet): 15 Feet Assistive device: Rolling walker (2 wheels) Gait Pattern/deviations: Step-to pattern, Shuffle, Trunk flexed Gait velocity: slowed Gait velocity interpretation: <1.31 ft/sec, indicative of household ambulator   General Gait Details: vc for upright posture   Stairs Stairs: Yes Stairs assistance: Contact guard assist Stair Management: One rail Right, Forwards Number of Stairs: 1 (x2) General stair comments: vc for sequencing, with increased pulling on foot board simulating railing on R at home, pt able to bring R LE up to step, followed by L LE and then step L LE down followed by R LE. pt and wife feel more confident about discharge home this afternoon. Wife confirms son and 63 year old son will be there to help him into his home         Balance Overall balance assessment: Needs assistance Sitting-balance support: Feet supported, No upper extremity supported Sitting balance-Leahy Scale: Good     Standing balance support: Bilateral upper extremity supported Standing balance-Leahy Scale: Poor Standing balance comment: reliant on UE support on RW due to L knee pain                            Cognition Arousal: Alert Behavior During Therapy: WFL for tasks assessed/performed Overall Cognitive Status: Within Functional Limits for tasks assessed  Exercises Total Joint Exercises Ankle Circles/Pumps: AROM, Both, 10 reps Quad Sets: AROM, Left, 10 reps Heel Slides: AROM, Seated (with towel under foot) Long Arc Quad: AROM, Left, 5 reps, Seated Marching in Standing:  (cues for upright posture) General Exercises - Lower Extremity Hip Flexion/Marching: AROM, Left, 10 reps, Seated    General Comments General comments (skin integrity, edema, etc.): VSS  on TA      Pertinent Vitals/Pain Pain Assessment Pain Assessment: 0-10 Pain Score: 7  Pain Location: L knee Pain Descriptors / Indicators: Aching, Discomfort Pain Intervention(s): Limited activity within patient's tolerance, Monitored during session, Repositioned     PT Goals (current goals can now be found in the care plan section) Acute Rehab PT Goals Patient Stated Goal: to get back to walking without the RW PT Goal Formulation: With patient Time For Goal Achievement: 03/30/23 Potential to Achieve Goals: Good Progress towards PT goals: Progressing toward goals    Frequency    Min 1X/week       AM-PAC PT "6 Clicks" Mobility   Outcome Measure  Help needed turning from your back to your side while in a flat bed without using bedrails?: A Little Help needed moving from lying on your back to sitting on the side of a flat bed without using bedrails?: A Little Help needed moving to and from a bed to a chair (including a wheelchair)?: A Little Help needed standing up from a chair using your arms (e.g., wheelchair or bedside chair)?: A Little Help needed to walk in hospital room?: A Little Help needed climbing 3-5 steps with a railing? : A Lot 6 Click Score: 17    End of Session Equipment Utilized During Treatment: Gait belt Activity Tolerance: Patient limited by pain Patient left: with call bell/phone within reach;with family/visitor present;in chair Nurse Communication: Mobility status PT Visit Diagnosis: Muscle weakness (generalized) (M62.81);Difficulty in walking, not elsewhere classified (R26.2);Pain Pain - Right/Left: Left Pain - part of body: Knee     Time: 1318-1350 PT Time Calculation (min) (ACUTE ONLY): 32 min  Charges:    $Gait Training: 8-22 mins $Therapeutic Exercise: 8-22 mins PT General Charges $$ ACUTE PT VISIT: 1 Visit                      B. Beverely Risen PT, DPT Acute Rehabilitation Services Please use secure chat or  Call Office  [phone]    [name] [facility] 03/20/2023, 2:00 PM

## 2023-03-20 NOTE — ED Notes (Signed)
This RN called over to xray to determine what the delay is in his xrays; was told the patient requested to wait on getting his xray until he was able to lay down and on an ER stretcher.

## 2023-03-20 NOTE — TOC Progression Note (Addendum)
Transition of Care (TOC) - Progression Note   Anticipated discharge today after PICC line placement   Pam with [employer] and Cindie with [facility] aware.   They will plan on 10 pm dose at home tonight.   Pam did teaching with patient's wife last night.   Patient aware. Secure chatted bedside nurses  Patient Details  Name: Dean Mayer MRN: [id number] Date of Birth: 1941/04/03  Transition of Care [facility]) CM/SW Contact  , Adria Devon, RN Phone Number: 03/20/2023, 10:15 AM  Clinical Narrative:       Expected Discharge Plan: Home w Home Health Services Barriers to Discharge: Continued Medical Work up  Expected Discharge Plan and Services   Discharge Planning Services: CM Consult Post Acute Care Choice: Home Health Living arrangements for the past 2 months: Single Family Home                 DME Arranged: N/A         HH Arranged: RN           Social Determinants of Health (SDOH) Interventions SDOH Screenings   Food Insecurity: No Food Insecurity (12/01/2022)  Housing: Low Risk  (12/01/2022)  Transportation Needs: No Transportation Needs (12/01/2022)  Utilities: Not At Risk (12/01/2022)  Financial Resource Strain: Low Risk  (11/19/2021)   [facility] from [facility]  Physical Activity: Inactive (11/19/2021)   Received from [facility], [facility]  Social Connections: Unknown (11/26/2022)   Received from [facility], [facility]  Stress: No Stress Concern Present (03/16/2022)   Received from [facility], [facility]  Tobacco Use: Medium Risk (03/15/2023)    Readmission Risk Interventions     No data to display

## 2023-03-20 NOTE — ED Triage Notes (Signed)
Pt reports left knee pain, s/p knee surgery on Friday in which they debrided it. He had a staph infection as well and has been on IV abx via PICC line. He twisted his left knee today when trying to get him out of his chair.

## 2023-03-20 NOTE — Plan of Care (Signed)

## 2023-03-20 NOTE — Progress Notes (Signed)
Peripherally Inserted Central Catheter Placement  The IV Nurse has discussed with the patient and/or persons authorized to consent for the patient, the purpose of this procedure and the potential benefits and risks involved with this procedure.  The benefits include less needle sticks, lab draws from the catheter, and the patient may be discharged home with the catheter. Risks include, but not limited to, infection, bleeding, blood clot (thrombus formation), and puncture of an artery; nerve damage and irregular heartbeat and possibility to perform a PICC exchange if needed/ordered by physician.  Alternatives to this procedure were also discussed.  Bard Power PICC patient education guide, fact sheet on infection prevention and patient information card has been provided to patient /or left at bedside.    PICC Placement Documentation  PICC Single Lumen 03/20/23 Right Basilic 45 cm 1 cm (Active)  Indication for Insertion or Continuance of Line Home intravenous therapies (PICC only) 03/20/23 1200  Exposed Catheter (cm) 1 cm 03/20/23 1200  Site Assessment Clean, Dry, Intact 03/20/23 1200  Line Status Flushed;Saline locked;Blood return noted 03/20/23 1200  Dressing Type Transparent;Securing device 03/20/23 1200  Dressing Status Antimicrobial disc in place;Clean, Dry, Intact 03/20/23 1200  Line Adjustment (NICU/IV Team Only) No 03/20/23 1200  Dressing Intervention New dressing;Other (Comment) 03/20/23 1200  Dressing Change Due 03/27/23 03/20/23 1200       Reginia Forts [place]ce 03/20/2023, 12:19 PM

## 2023-03-20 NOTE — Progress Notes (Signed)
Upon discharge, patient was not able to transfer into his personal vehicle with the help of Itzel Jaimes the NT. The unit secretary was called to send more help with the transfer. Didier one of Nts from the unit and Karle Starch from OT came down to make another attempt and were unsuccessful. Patient was taken down to the ED to be readmitted to the hospital stating that he was not comfortable going home in his current condition.

## 2023-03-20 NOTE — Progress Notes (Signed)
      INFECTIOUS DISEASE ATTENDING ADDENDUM:   Date: 03/20/2023  Patient name: Dean Mayer  Medical record number: [id number]  Date of birth: 1940-12-16   Diagnosis: Left septic knee  Culture Result: MSSA  No Known Allergies  OPAT Orders Discharge antibiotics to be given via PICC line Discharge antibiotics: Cefazolin 2g IV every 8 hours   Duration: 6 weeks End Date: 04/28/23   [facility] Care Per Protocol:  Home health RN for IV administration and teaching; PICC line care and labs.    Labs weekly while on IV antibiotics: _x_ CBC with differential _x_ BMP __ CMP _x_ CRP _x_ ESR  x__ Please pull PIC at completion of IV antibiotics __ Please leave PIC in place until doctor has seen patient or been notified  Fax weekly labs to [phone]  Clinic Follow Up Appt:   Dean Mayer has an appointment on 04/10/2023 at 1115 AM with Dr. Daiva Eves at  [facility] for Infectious Disease, which  is located in the [facility] at  [address] in [address].  [address], which is located to the left of the elevators.  Phone: [phone]  Fax: [phone]  [email or web address]  The patient should arrive 30 minutes prior to their appoitment.   Dean Mayer 03/20/2023, 11:34 AM

## 2023-03-21 ENCOUNTER — Emergency Department (HOSPITAL_COMMUNITY): Payer: Medicare HMO

## 2023-03-21 DIAGNOSIS — K219 Gastro-esophageal reflux disease without esophagitis: Secondary | ICD-10-CM | POA: Diagnosis present

## 2023-03-21 DIAGNOSIS — M009 Pyogenic arthritis, unspecified: Secondary | ICD-10-CM | POA: Diagnosis present

## 2023-03-21 DIAGNOSIS — E785 Hyperlipidemia, unspecified: Secondary | ICD-10-CM | POA: Diagnosis present

## 2023-03-21 DIAGNOSIS — R7303 Prediabetes: Secondary | ICD-10-CM | POA: Diagnosis present

## 2023-03-21 DIAGNOSIS — Z79899 Other long term (current) drug therapy: Secondary | ICD-10-CM | POA: Diagnosis not present

## 2023-03-21 DIAGNOSIS — M25562 Pain in left knee: Secondary | ICD-10-CM | POA: Diagnosis not present

## 2023-03-21 DIAGNOSIS — I10 Essential (primary) hypertension: Secondary | ICD-10-CM | POA: Diagnosis present

## 2023-03-21 DIAGNOSIS — M109 Gout, unspecified: Secondary | ICD-10-CM | POA: Diagnosis present

## 2023-03-21 DIAGNOSIS — Z7982 Long term (current) use of aspirin: Secondary | ICD-10-CM | POA: Diagnosis not present

## 2023-03-21 DIAGNOSIS — M00062 Staphylococcal arthritis, left knee: Secondary | ICD-10-CM | POA: Diagnosis present

## 2023-03-21 DIAGNOSIS — Z823 Family history of stroke: Secondary | ICD-10-CM | POA: Diagnosis not present

## 2023-03-21 DIAGNOSIS — Z87442 Personal history of urinary calculi: Secondary | ICD-10-CM | POA: Diagnosis not present

## 2023-03-21 DIAGNOSIS — S83242A Other tear of medial meniscus, current injury, left knee, initial encounter: Secondary | ICD-10-CM | POA: Diagnosis not present

## 2023-03-21 DIAGNOSIS — M25569 Pain in unspecified knee: Secondary | ICD-10-CM | POA: Diagnosis present

## 2023-03-21 DIAGNOSIS — M25462 Effusion, left knee: Secondary | ICD-10-CM | POA: Diagnosis present

## 2023-03-21 DIAGNOSIS — Z85819 Personal history of malignant neoplasm of unspecified site of lip, oral cavity, and pharynx: Secondary | ICD-10-CM | POA: Diagnosis not present

## 2023-03-21 DIAGNOSIS — D62 Acute posthemorrhagic anemia: Secondary | ICD-10-CM | POA: Diagnosis not present

## 2023-03-21 DIAGNOSIS — Z87891 Personal history of nicotine dependence: Secondary | ICD-10-CM | POA: Diagnosis not present

## 2023-03-21 DIAGNOSIS — Z79890 Hormone replacement therapy (postmenopausal): Secondary | ICD-10-CM | POA: Diagnosis not present

## 2023-03-21 DIAGNOSIS — Z8249 Family history of ischemic heart disease and other diseases of the circulatory system: Secondary | ICD-10-CM | POA: Diagnosis not present

## 2023-03-21 DIAGNOSIS — Z8546 Personal history of malignant neoplasm of prostate: Secondary | ICD-10-CM | POA: Diagnosis not present

## 2023-03-21 DIAGNOSIS — M00869 Arthritis due to other bacteria, unspecified knee: Secondary | ICD-10-CM | POA: Diagnosis not present

## 2023-03-21 DIAGNOSIS — N400 Enlarged prostate without lower urinary tract symptoms: Secondary | ICD-10-CM | POA: Diagnosis present

## 2023-03-21 DIAGNOSIS — E039 Hypothyroidism, unspecified: Secondary | ICD-10-CM | POA: Diagnosis present

## 2023-03-21 DIAGNOSIS — X501XXA Overexertion from prolonged static or awkward postures, initial encounter: Secondary | ICD-10-CM | POA: Diagnosis not present

## 2023-03-21 DIAGNOSIS — S83289A Other tear of lateral meniscus, current injury, unspecified knee, initial encounter: Secondary | ICD-10-CM | POA: Diagnosis present

## 2023-03-21 DIAGNOSIS — R768 Other specified abnormal immunological findings in serum: Secondary | ICD-10-CM | POA: Diagnosis present

## 2023-03-21 DIAGNOSIS — B9561 Methicillin susceptible Staphylococcus aureus infection as the cause of diseases classified elsewhere: Secondary | ICD-10-CM | POA: Diagnosis present

## 2023-03-21 LAB — CBC WITH DIFFERENTIAL/PLATELET
Abs Immature Granulocytes: 0.04 10*3/uL (ref 0.00–0.07)
Basophils Absolute: 0 10*3/uL (ref 0.0–0.1)
Basophils Relative: 0 %
Eosinophils Absolute: 0.1 10*3/uL (ref 0.0–0.5)
Eosinophils Relative: 1 %
HCT: 32.1 % — ABNORMAL LOW (ref 39.0–52.0)
Hemoglobin: 10.5 g/dL — ABNORMAL LOW (ref 13.0–17.0)
Immature Granulocytes: 1 %
Lymphocytes Relative: 12 %
Lymphs Abs: 0.7 10*3/uL (ref 0.7–4.0)
MCH: 29.1 pg (ref 26.0–34.0)
MCHC: 32.7 g/dL (ref 30.0–36.0)
MCV: 88.9 fL (ref 80.0–100.0)
Monocytes Absolute: 0.4 10*3/uL (ref 0.1–1.0)
Monocytes Relative: 7 %
Neutro Abs: 4.7 10*3/uL (ref 1.7–7.7)
Neutrophils Relative %: 79 %
Platelets: 302 10*3/uL (ref 150–400)
RBC: 3.61 MIL/uL — ABNORMAL LOW (ref 4.22–5.81)
RDW: 13.5 % (ref 11.5–15.5)
WBC: 6 10*3/uL (ref 4.0–10.5)
nRBC: 0 % (ref 0.0–0.2)

## 2023-03-21 LAB — CBC
HCT: 36.3 % — ABNORMAL LOW (ref 39.0–52.0)
Hemoglobin: 11.9 g/dL — ABNORMAL LOW (ref 13.0–17.0)
MCH: 29 pg (ref 26.0–34.0)
MCHC: 32.8 g/dL (ref 30.0–36.0)
MCV: 88.5 fL (ref 80.0–100.0)
Platelets: 288 10*3/uL (ref 150–400)
RBC: 4.1 MIL/uL — ABNORMAL LOW (ref 4.22–5.81)
RDW: 13.4 % (ref 11.5–15.5)
WBC: 6.2 10*3/uL (ref 4.0–10.5)
nRBC: 0 % (ref 0.0–0.2)

## 2023-03-21 LAB — CREATININE, SERUM
Creatinine, Ser: 0.74 mg/dL (ref 0.61–1.24)
GFR, Estimated: >60 mL/min (ref >60)

## 2023-03-21 LAB — BASIC METABOLIC PANEL
Anion gap: 7 (ref 5–15)
BUN: 14 mg/dL (ref 8–23)
CO2: 25 mmol/L (ref 22–32)
Calcium: 8.4 mg/dL — ABNORMAL LOW (ref 8.9–10.3)
Chloride: 102 mmol/L (ref 98–111)
Creatinine, Ser: 0.75 mg/dL (ref 0.61–1.24)
GFR, Estimated: >60 mL/min (ref >60)
Glucose, Bld: 124 mg/dL — ABNORMAL HIGH (ref 70–99)
Potassium: 3.7 mmol/L (ref 3.5–5.1)
Sodium: 134 mmol/L — ABNORMAL LOW (ref 135–145)

## 2023-03-21 LAB — GLUCOSE, CAPILLARY
Glucose-Capillary: 137 mg/dL — ABNORMAL HIGH (ref 70–99)
Glucose-Capillary: 119 mg/dL — ABNORMAL HIGH (ref 70–99)

## 2023-03-21 MED ORDER — HEPARIN SODIUM (PORCINE) 5000 UNIT/ML IJ SOLN
5000.0000 [IU] | Freq: Three times a day (TID) | INTRAMUSCULAR | Status: DC
  Administered 2023-03-21 – 2023-03-27 (×17): 5000 [IU] via SUBCUTANEOUS
  Filled 2023-03-21 (×17): qty 1

## 2023-03-21 MED ORDER — DOCUSATE SODIUM 100 MG PO CAPS
100.0000 mg | ORAL_CAPSULE | Freq: Two times a day (BID) | ORAL | Status: DC
  Administered 2023-03-21 – 2023-03-27 (×10): 100 mg via ORAL
  Filled 2023-03-21 (×12): qty 1

## 2023-03-21 MED ORDER — IRON COMPLEX PO CAPS: ORAL_CAPSULE | Freq: Every day | ORAL | Status: DC

## 2023-03-21 MED ORDER — ATORVASTATIN CALCIUM 40 MG PO TABS
40.0000 mg | ORAL_TABLET | Freq: Every evening | ORAL | Status: DC
  Administered 2023-03-21 – 2023-03-26 (×6): 40 mg via ORAL
  Filled 2023-03-21 (×6): qty 1

## 2023-03-21 MED ORDER — LISINOPRIL 5 MG PO TABS
5.0000 mg | ORAL_TABLET | Freq: Two times a day (BID) | ORAL | Status: DC
  Administered 2023-03-21 – 2023-03-27 (×13): 5 mg via ORAL
  Filled 2023-03-21 (×13): qty 1

## 2023-03-21 MED ORDER — ALLOPURINOL 100 MG PO TABS
100.0000 mg | ORAL_TABLET | Freq: Every day | ORAL | Status: DC
  Administered 2023-03-22 – 2023-03-27 (×6): 100 mg via ORAL
  Filled 2023-03-21 (×6): qty 1

## 2023-03-21 MED ORDER — POLYETHYLENE GLYCOL 3350 17 G PO PACK: 17.0000 g | PACK | Freq: Every day | ORAL | Status: DC | PRN

## 2023-03-21 MED ORDER — ALBUTEROL SULFATE (2.5 MG/3ML) 0.083% IN NEBU: 2.5000 mg | INHALATION_SOLUTION | RESPIRATORY_TRACT | Status: DC | PRN

## 2023-03-21 MED ORDER — HYDROMORPHONE HCL 1 MG/ML IJ SOLN
1.0000 mg | Freq: Once | INTRAMUSCULAR | Status: AC
  Administered 2023-03-21: 1 mg via INTRAVENOUS
  Filled 2023-03-21: qty 1

## 2023-03-21 MED ORDER — ONDANSETRON HCL 4 MG PO TABS: 4.0000 mg | ORAL_TABLET | Freq: Four times a day (QID) | ORAL | Status: DC | PRN

## 2023-03-21 MED ORDER — SENNOSIDES-DOCUSATE SODIUM 8.6-50 MG PO TABS
1.0000 | ORAL_TABLET | Freq: Every evening | ORAL | Status: DC | PRN
  Administered 2023-03-22: 1 via ORAL
  Filled 2023-03-21: qty 1

## 2023-03-21 MED ORDER — CEFAZOLIN IV (FOR PTA / DISCHARGE USE ONLY): 2.0000 g | Freq: Three times a day (TID) | INTRAVENOUS | Status: DC

## 2023-03-21 MED ORDER — CEFAZOLIN SODIUM-DEXTROSE 2-4 GM/100ML-% IV SOLN
2.0000 g | Freq: Three times a day (TID) | INTRAVENOUS | Status: DC
  Administered 2023-03-21 – 2023-03-27 (×19): 2 g via INTRAVENOUS
  Filled 2023-03-21 (×19): qty 100

## 2023-03-21 MED ORDER — AMLODIPINE BESYLATE 10 MG PO TABS
10.0000 mg | ORAL_TABLET | Freq: Every day | ORAL | Status: DC
  Administered 2023-03-21 – 2023-03-27 (×7): 10 mg via ORAL
  Filled 2023-03-21 (×7): qty 1

## 2023-03-21 MED ORDER — LEVOTHYROXINE SODIUM 100 MCG PO TABS
100.0000 ug | ORAL_TABLET | Freq: Every day | ORAL | Status: DC
  Administered 2023-03-22 – 2023-03-27 (×6): 100 ug via ORAL
  Filled 2023-03-21 (×6): qty 1

## 2023-03-21 MED ORDER — OXYCODONE-ACETAMINOPHEN 5-325 MG PO TABS
2.0000 | ORAL_TABLET | ORAL | Status: DC | PRN
  Administered 2023-03-21 – 2023-03-25 (×8): 2 via ORAL
  Filled 2023-03-21 (×8): qty 2

## 2023-03-21 MED ORDER — ACETAMINOPHEN 500 MG PO TABS
1000.0000 mg | ORAL_TABLET | Freq: Two times a day (BID) | ORAL | Status: DC | PRN
  Administered 2023-03-26: 1000 mg via ORAL
  Filled 2023-03-21 (×2): qty 2

## 2023-03-21 MED ORDER — TAMSULOSIN HCL 0.4 MG PO CAPS
0.4000 mg | ORAL_CAPSULE | Freq: Every day | ORAL | Status: DC
  Administered 2023-03-21 – 2023-03-27 (×7): 0.4 mg via ORAL
  Filled 2023-03-21 (×7): qty 1

## 2023-03-21 MED ORDER — ASPIRIN 325 MG PO TBEC
325.0000 mg | DELAYED_RELEASE_TABLET | Freq: Every day | ORAL | Status: DC
  Administered 2023-03-22 – 2023-03-27 (×6): 325 mg via ORAL
  Filled 2023-03-21 (×6): qty 1

## 2023-03-21 MED ORDER — OXYCODONE-ACETAMINOPHEN 5-325 MG PO TABS
2.0000 | ORAL_TABLET | Freq: Once | ORAL | Status: AC
  Administered 2023-03-21: 2 via ORAL
  Filled 2023-03-21: qty 2

## 2023-03-21 MED ORDER — VITAMIN C 500 MG PO TABS
1000.0000 mg | ORAL_TABLET | Freq: Every day | ORAL | Status: DC
  Administered 2023-03-21 – 2023-03-27 (×7): 1000 mg via ORAL
  Filled 2023-03-21 (×7): qty 2

## 2023-03-21 MED ORDER — ONDANSETRON HCL 4 MG/2ML IJ SOLN: 4.0000 mg | Freq: Four times a day (QID) | INTRAMUSCULAR | Status: DC | PRN

## 2023-03-21 MED ORDER — HYDROCODONE-ACETAMINOPHEN 5-325 MG PO TABS: 1.0000 | ORAL_TABLET | Freq: Four times a day (QID) | ORAL | Status: DC | PRN

## 2023-03-21 NOTE — ED Notes (Signed)
Patient reports he is having spasm in the knee. Ice was removed to see if this helps.

## 2023-03-21 NOTE — ED Notes (Signed)
Patient transported to MRI 

## 2023-03-21 NOTE — Plan of Care (Signed)

## 2023-03-21 NOTE — Progress Notes (Signed)
PHARMACY CONSULT NOTE FOR:  OUTPATIENT  PARENTERAL ANTIBIOTIC THERAPY (OPAT)  Notified of re-admission - will repend old OPAT orders, noted possible SNF placement at discharge  Indication: MSSA L-septic knee Regimen: Cefazolin 2g IV every 8 hours End date: 04/28/23 (6 weeks from neg BCx 8/11)  IV antibiotic discharge orders are pended. To discharging provider:  please sign these orders via discharge navigator,  Select New Orders & click on the button choice - Manage This Unsigned Work.     Thank you for allowing pharmacy to be a part of this patient's care.  Georgina Pillion, PharmD, BCPS, BCIDP Infectious Diseases Clinical Pharmacist 03/21/2023 1:43 PM   **Pharmacist phone directory can now be found on [email or web address] ([email or web address]).  Listed under [facility].

## 2023-03-21 NOTE — ED Notes (Signed)
ED TO INPATIENT HANDOFF REPORT  ED Nurse Name and Phone #: Tori [phone]  S Name/Age/Gender Dean Mayer 82 y.o. male Room/Bed: H022C/H022C  Code Status   Code Status: Prior  Home/SNF/Other Home Patient oriented to: self, place, time, and situation Is this baseline? Yes   Triage Complete: Triage complete  Chief Complaint Painful knee [M25.569]  Triage Note Pt reports left knee pain, s/p knee surgery on Friday in which they debrided it. He had a staph infection as well and has been on IV abx via PICC line. He twisted his left knee today when trying to get him out of his chair.    Allergies No Known Allergies  Level of Care/Admitting Diagnosis ED Disposition     ED Disposition  Admit   Condition  --   Comment  Hospital Area: [facility] [[id number]]  Level of Care: Med-Surg [16]  May admit patient to [facility] or [facility] if equivalent level of care is available:: No  Covid Evaluation: Asymptomatic - no recent exposure (last 10 days) testing not required  Diagnosis: Painful knee [225019]  Admitting Physician: Lurline Del [[id number]]  Attending Physician: Lurline Del [[id number]]  Certification:: I certify this patient will need inpatient services for at least 2 midnights  Expected Medical Readiness: 03/26/2023          B Medical/Surgery History Past Medical History:  Diagnosis Date   Eye abnormalities    GERD (gastroesophageal reflux disease)    Hypertension    Kidney stones    Prostate cancer (HCC)    Throat cancer [facility])    Past Surgical History:  Procedure Laterality Date   CAROTID STENT     EYE SURGERY     I & D EXTREMITY Left 03/15/2023   Procedure: IRRIGATION AND DEBRIDEMENT LEFT KNEE;  Surgeon: Roby Lofts, MD;  Location: [facility] OR;  Service: Orthopedics;  Laterality: Left;   TONSILLECTOMY       A IV Location/Drains/Wounds Patient Lines/Drains/Airways Status     Active Line/Drains/Airways     Name Placement  date Placement time Site Days   PICC Single Lumen 03/20/23 Right Basilic 45 cm 1 cm 03/20/23  1914  Basilic  1            Intake/Output Last 24 hours No intake or output data in the 24 hours ending 03/21/23 1145  Labs/Imaging Results for orders placed or performed during the hospital encounter of 03/20/23 (from the past 48 hour(s))  CBC with Differential     Status: Abnormal   Collection Time: 03/21/23  6:56 AM  Result Value Ref Range   WBC 6.0 4.0 - 10.5 K/uL   RBC 3.61 (L) 4.22 - 5.81 MIL/uL   Hemoglobin 10.5 (L) 13.0 - 17.0 g/dL   HCT 78.2 (L) 95.6 - 21.3 %   MCV 88.9 80.0 - 100.0 fL   MCH 29.1 26.0 - 34.0 pg   MCHC 32.7 30.0 - 36.0 g/dL   RDW 08.6 57.8 - 46.9 %   Platelets 302 150 - 400 K/uL   nRBC 0.0 0.0 - 0.2 %   Neutrophils Relative % 79 %   Neutro Abs 4.7 1.7 - 7.7 K/uL   Lymphocytes Relative 12 %   Lymphs Abs 0.7 0.7 - 4.0 K/uL   Monocytes Relative 7 %   Monocytes Absolute 0.4 0.1 - 1.0 K/uL   Eosinophils Relative 1 %   Eosinophils Absolute 0.1 0.0 - 0.5 K/uL   Basophils Relative 0 %  Basophils Absolute 0.0 0.0 - 0.1 K/uL   Immature Granulocytes 1 %   Abs Immature Granulocytes 0.04 0.00 - 0.07 K/uL    Comment: Performed at [facility], [address] [place], [place] [address]  Basic metabolic panel     Status: Abnormal   Collection Time: 03/21/23  6:56 AM  Result Value Ref Range   Sodium 134 (L) 135 - 145 mmol/L   Potassium 3.7 3.5 - 5.1 mmol/L   Chloride 102 98 - 111 mmol/L   CO2 25 22 - 32 mmol/L   Glucose, Bld 124 (H) 70 - 99 mg/dL    Comment: Glucose reference range applies only to samples taken after fasting for at least 8 hours.   BUN 14 8 - 23 mg/dL   Creatinine, Ser 6.04 0.61 - 1.24 mg/dL   Calcium 8.4 (L) 8.9 - 10.3 mg/dL   GFR, Estimated >54 >09 mL/min    Comment: (NOTE) Calculated using the CKD-EPI Creatinine Equation (2021)    Anion gap 7 5 - 15    Comment: Performed at [facility], [address], [place], [place]  [address]   MR KNEE LEFT WO CONTRAST  Result Date: 03/21/2023 CLINICAL DATA:  History of septic arthritis, left knee pain EXAM: MRI OF THE LEFT KNEE WITHOUT CONTRAST TECHNIQUE: Multiplanar, multisequence MR imaging of the knee was performed. No intravenous contrast was administered. COMPARISON:  None Available. FINDINGS: MENISCI Medial: Intact. Lateral: Horizontal tear of the anterior horn and body of the lateral meniscus extending to the free edge. Small undersurface tear of the posterior horn of the lateral meniscus (image 23/series 35). LIGAMENTS Cruciates: ACL and PCL are intact. Collaterals: Medial collateral ligament is intact. Lateral collateral ligament complex is intact. CARTILAGE Patellofemoral: Cartilage fissuring of the trochlear groove. Mild partial-thickness cartilage loss of the lateral trochlea and lateral patellar facet. Medial:  No chondral defect. Lateral: Partial thickness cartilage loss of the lateral femorotibial compartment. JOINT: Large joint effusion. Mild edema in Hoffa's fat. No plical thickening. POPLITEAL FOSSA: Popliteus tendon is intact. No Baker's cyst. EXTENSOR MECHANISM: Intact quadriceps tendon. Intact patellar tendon. Intact lateral patellar retinaculum. Intact medial patellar retinaculum. Intact MPFL. BONES: No aggressive osseous lesion. No fracture or dislocation. Subcortical marrow edema at the PCL origin and insertion and along the roof of the intercondylar notch. Bone island in the distal lateral femoral condyle. Other: Muscle edema in the popliteus muscle.  No muscle atrophy. IMPRESSION: 1. Large left knee joint effusion with low-level marrow edema in the proximal tibia and distal femur as well as muscle edema in the popliteus muscle. Overall findings are concerning for septic arthritis until proven otherwise. Recommend further evaluation with arthrocentesis. 2. Horizontal tear of the anterior horn and body of the lateral meniscus extending to the free edge. Small  undersurface tear of the posterior horn of the lateral meniscus. 3. Cartilage fissuring of the trochlear groove. Mild partial-thickness cartilage loss of the lateral trochlea and lateral patellar facet. 4. Partial thickness cartilage loss of the lateral femorotibial compartment. 5. Muscle edema in the popliteus muscle. Electronically Signed   By: Elige Ko M.D.   On: 03/21/2023 10:48   DG Knee Complete 4 Views Left  Result Date: 03/21/2023 CLINICAL DATA:  Left knee pain, status post knee surgery on Friday. History of debridement for staph infection. Twisted left knee today getting out of a chair. EXAM: LEFT KNEE - COMPLETE 4+ VIEW COMPARISON:  03/11/2023. FINDINGS: No acute fracture or dislocation. Minimal degenerative changes are  present. No periosteal elevation or bony erosions. There is a suprapatellar joint effusion containing air. Vascular calcifications are noted in the soft tissues. IMPRESSION: 1. No acute osseous abnormality. 2. Small suprapatellar joint effusion containing air, which may be postsurgical versus residual infection. Electronically Signed   By: Thornell Sartorius M.D.   On: 03/21/2023 02:15   [place] EKG SITE RITE  Result Date: 03/20/2023 If Site Rite image not attached, placement could not be confirmed due to current cardiac rhythm.   Pending Labs Unresulted Labs (From admission, onward)    None       Vitals/Pain Today's Vitals   03/21/23 0611 03/21/23 0927 03/21/23 0927 03/21/23 1006  BP: 131/64  (!) 155/70   Pulse: 84  79   Resp: 15  16   Temp:   97.8 F (36.6 C)   TempSrc:   Oral   SpO2: 99%  99%   PainSc:  10-Worst pain ever  Asleep    Isolation Precautions No active isolations  Medications Medications  ceFAZolin (ANCEF) IVPB 2g/100 mL premix (0 g Intravenous Stopped 03/21/23 0330)  oxyCODONE-acetaminophen (PERCOCET/ROXICET) 5-325 MG per tablet 2 tablet (2 tablets Oral Given 03/21/23 0936)  HYDROmorphone (DILAUDID) injection 1 mg (1 mg Intravenous Given 03/21/23  0254)  oxyCODONE-acetaminophen (PERCOCET/ROXICET) 5-325 MG per tablet 2 tablet (2 tablets Oral Given 03/21/23 0511)    Mobility walks with device     Focused Assessments Knee pain    R Recommendations: See Admitting Provider Note  Report given to:   Additional Notes: Wife at bedside, axox4

## 2023-03-21 NOTE — Plan of Care (Signed)
  Problem: Pain Managment: Goal: General experience of comfort will improve Outcome: Progressing   Problem: Safety: Goal: Ability to remain free from injury will improve Outcome: Progressing   

## 2023-03-21 NOTE — ED Notes (Signed)
Attempted to ambulated the patient in the hallway using a walker. Patient is very weak when standing. Patient was able to move to the edge of the bed the with the assistance of this nurse and his wife. After several minutes patient was able to stand and take 4-6 steps before feeling weak and needing to sit on the bed. Patient is now back in bed.   Wife is expressing concern as to how she will care for the patient once he gets home.

## 2023-03-21 NOTE — ED Provider Notes (Signed)
[place] EMERGENCY DEPARTMENT AT [facility] Provider Note   CSN: [id number] Arrival date & time: 03/20/23  1826     History  Chief Complaint  Patient presents with   Knee Pain    Dean Mayer is a 82 y.o. male.  82 year old male that presents ER today with left knee pain.  Patient was admitted to the hospital for couple days secondary to septic arthritis.  He was discharged to go home and complete his IV antibiotics.  While the hospital staff was trying to fit into the car they twisted his body while his foot was planted and now his left knee hurts really bad again and is not able to ambulate.  Since he had already been discharged they instructed her to come back to the emergency room for evaluation.   Knee Pain      Home Medications Prior to Admission medications   Medication Sig Start Date End Date Taking? Authorizing Provider  acetaminophen (TYLENOL) 500 MG tablet Take 1,000 mg by mouth 2 (two) times daily as needed for mild pain or moderate pain.    [provider]  allopurinol (ZYLOPRIM) 100 MG tablet Take 1 tablet (100 mg total) by mouth daily. 03/05/23   Fuller Plan, MD  amLODipine (NORVASC) 10 MG tablet Take 10 mg by mouth daily.    [provider]  ascorbic acid (VITAMIN C) 500 MG tablet Take 1,000 mg by mouth daily.    [provider]  aspirin EC 325 MG tablet Take 325 mg by mouth daily.    [provider]  atorvastatin (LIPITOR) 40 MG tablet Take 40 mg by mouth every evening.    [provider]  brimonidine (ALPHAGAN) 0.2 % ophthalmic solution Place 1 drop into the right eye 2 (two) times daily.    [provider]  ceFAZolin (ANCEF) IVPB Inject 2 g into the vein every 8 (eight) hours. Indication:  MSSA septic knee First Dose: Yes Last Day of Therapy:  04/28/23 Labs - Once weekly:  CBC/D and BMP, ESR and CRP Method of administration: IV Push Pull PICC line at the completion of IV antibiotic  therapy Method of administration may be changed at the discretion of home infusion pharmacist based upon assessment of the patient and/or caregiver's ability to self-administer the medication ordered. 03/20/23 04/28/23  Randall Hiss, MD  docusate sodium (COLACE) 100 MG capsule Take 1 capsule (100 mg total) by mouth 2 (two) times daily. 03/20/23   Lanae Boast, MD  fluticasone (FLONASE) 50 MCG/ACT nasal spray Place 2 sprays into the nose daily as needed for allergies.    [provider]  HYDROcodone-acetaminophen (NORCO) 5-325 MG tablet Take 1 tablet by mouth every 6 (six) hours as needed for up to 5 days for severe pain. 03/20/23 03/25/23  Lanae Boast, MD  Iron Combinations (IRON COMPLEX PO) Take 1 tablet by mouth daily.    [provider]  levothyroxine (SYNTHROID) 100 MCG tablet Take 100 mcg by mouth daily.    [provider]  lisinopril (ZESTRIL) 5 MG tablet Take 5 mg by mouth 2 (two) times daily.    [provider]  Multiple Vitamins-Minerals (ONE-A-DAY 50 PLUS PO) Take 1 tablet by mouth daily.    [provider]  Omega-3 Fatty Acids (FISH OIL) 1200 MG CAPS Take 1,200 mg by mouth daily.    [provider]  polyethylene glycol powder (GLYCOLAX/MIRALAX) 17 GM/SCOOP powder Mix as directed and take 1 capful (17 g)  by mouth daily as needed for mild constipation. 03/20/23   Lanae Boast, MD  senna-docusate (SENOKOT-S) 8.6-50 MG tablet Take 1 tablet by mouth at bedtime as needed for mild constipation. 03/20/23   Lanae Boast, MD  tamsulosin (FLOMAX) 0.4 MG CAPS capsule Take 0.4 mg by mouth daily.    [provider]      Allergies    Patient has no known allergies.    Review of Systems   Review of Systems  Physical Exam Updated Vital Signs BP 131/64   Pulse 84   Temp 98.4 F (36.9 C) (Oral)   Resp 15   SpO2 99%  Physical Exam Vitals and nursing note reviewed.  Constitutional:      Appearance: He is well-developed.  HENT:     Head:  Normocephalic and atraumatic.  Cardiovascular:     Rate and Rhythm: Normal rate.  Pulmonary:     Effort: Pulmonary effort is normal. No respiratory distress.  Abdominal:     General: There is no distension.  Musculoskeletal:        General: Swelling and tenderness present. Normal range of motion.     Cervical back: Normal range of motion.     Right lower leg: Edema present.     Left lower leg: Edema present.  Skin:    General: Skin is warm and dry.  Neurological:     Mental Status: He is alert.     ED Results / Procedures / Treatments   Labs (all labs ordered are listed, but only abnormal results are displayed) Labs Reviewed  CBC WITH DIFFERENTIAL/PLATELET - Abnormal; Notable for the following components:      Result Value   RBC 3.61 (*)    Hemoglobin 10.5 (*)    HCT 32.1 (*)    All other components within normal limits  BASIC METABOLIC PANEL - Abnormal; Notable for the following components:   Sodium 134 (*)    Glucose, Bld 124 (*)    Calcium 8.4 (*)    All other components within normal limits    EKG None  Radiology DG Knee Complete 4 Views Left  Result Date: 03/21/2023 CLINICAL DATA:  Left knee pain, status post knee surgery on Friday. History of debridement for staph infection. Twisted left knee today getting out of a chair. EXAM: LEFT KNEE - COMPLETE 4+ VIEW COMPARISON:  03/11/2023. FINDINGS: No acute fracture or dislocation. Minimal degenerative changes are present. No periosteal elevation or bony erosions. There is a suprapatellar joint effusion containing air. Vascular calcifications are noted in the soft tissues. IMPRESSION: 1. No acute osseous abnormality. 2. Small suprapatellar joint effusion containing air, which may be postsurgical versus residual infection. Electronically Signed   By: Thornell Sartorius M.D.   On: 03/21/2023 02:15   [place] EKG SITE RITE  Result Date: 03/20/2023 If Site Rite image not attached, placement could not be confirmed due to current cardiac  rhythm.   Procedures Procedures    Medications Ordered in ED Medications  ceFAZolin (ANCEF) IVPB 2g/100 mL premix (0 g Intravenous Stopped 03/21/23 0330)  oxyCODONE-acetaminophen (PERCOCET/ROXICET) 5-325 MG per tablet 2 tablet (has no administration in time range)  HYDROmorphone (DILAUDID) injection 1 mg (1 mg Intravenous Given 03/21/23 0254)  oxyCODONE-acetaminophen (PERCOCET/ROXICET) 5-325 MG per tablet 2 tablet (2 tablets Oral Given 03/21/23 4010)    ED Course/ Medical Decision Making/ A&P  Medical Decision Making Amount and/or Complexity of Data Reviewed Labs: ordered. Radiology: ordered.  Risk Prescription drug management.  No obvious knee derangement on my interpretation of the XR. Will treat pain and attempt ambulation.  Patient was post to be on Ancef at home however because they were waiting in the waiting room this long time she had to cancel the delivery so he missed to 2200 dose and is post have another dose at 0600 and they will not be able to get that one either. Will give the 2200 dose right now. If patient is able to get pain controlled and ambulate, will still likely need to get AM dose here but could go home. If he can't walk, may need MRI to evaluate for any ligamentous injury prior to disposition.  Pain control with Dilaudid.  Transition to oral medication and patient is able to walk a couple steps but still significantly weak combined with the pain.  Wife is very concerned about his ability to safely be discharged.  Discussed with hospitalist overnight and stated if there was a new injury on MRI such as a ligament tear or something along those lines then would be reasonable to readmit him back to the hospital for orthopedic consultation and pain control pending SNF.  CBC CMP ordered for that reason.  MRI ordered.  Family aware that if there is no new injury on MRI he may need to go to rehab from the ER and are okay with that.  His Ancef has  been ordered for every 8 hours as prescribed from the hospital.  Care transferred to oncoming provider pending MRI.  Final Clinical Impression(s) / ED Diagnoses Final diagnoses:  None    Rx / DC Orders ED Discharge Orders     None         , Barbara Cower, MD 03/21/23 [phone]

## 2023-03-21 NOTE — ED Notes (Signed)
Patient transported to X-ray 

## 2023-03-21 NOTE — ED Provider Notes (Signed)
Patient signed out to me at 07 100 by Dr. Clayborne Dana pending MRI.  In short this is an 82 year old male with recent discharge from the hospital after treatment for septic arthritis of his left knee.  As he was trying to leave the hospital he planted on his foot and twisted his knee and had significantly worsening pain and is now unable to ambulate.  On arrival here he initially had labs and x-ray of his knee.  X-ray showed no acute abnormality and labs were within normal range.  The previous team and spoke with hospitalist who recommended MRI and if he has an acute injury may need to be readmitted to the hospital versus evaluated by social work in the ED for SNF placement.  The patient has been ordered his Ancef and has been given pain control.  The patient is at MRI at time of my evaluation.  Clinical Course as of 03/21/23 1116  Thu Mar 21, 2023  1053 MRI consistent with known septic arthritis. Also shows evidence of meniscus tear which is consistent with acute worsening of pain. Will be admitted to the hospitalist for pain management and PT eval. [VK]    Clinical Course User Index [VK] Rexford Maus, DO      Rexford Maus, [place] 03/21/23 1116

## 2023-03-21 NOTE — H&P (Signed)
History and Physical    Dean Mayer ZOX:[id number] DOB: Mar 27, 1941 DOA: 03/20/2023  PCP: Angelique Blonder, MD  Patient coming from: lobby   I have personally briefly reviewed patient's old medical records in [facility]  Chief Complaint:  Increase left knee pain s/p pivot  HPI: Dean Mayer is a 82 y.o. male with medical history significant of  82yom w/ history of gout, HTN, HLD, BPH, hypothyroidism, throat cancer, positive ANA, positive rheumatoid factor who on 7/29 saw rheumatologist with complaints of left knee pain and swelling.  Patient add fluid analysis at that time  And fluid was consistent with gout. He also at that time was treated with intra-articular steroid injection. Patient has some improvement however few days however pain returned and was persistent. He was seen in ED and re-tapped on 8/5 and sent home with pain medication. Patient however returned on 8/30 with persistent pain and  swelling  as well as fever. ON further review of record at that time it was noted that initial tap 7/29 cultures grew MSS that was pan-sensitive. Patient discussed with orthopedics who recommended admission for diagnosis of MSSA left knee septic arthritis. During hospitalization patient underwent I/D of left knee. Patient there after was prescribed 6 week course of IV antibiotics with out patient f/u with orthopedics. Patient on discharged today, in attempt to transfer from wheel chair to his car pivoted and at that point has acute intense pain. Due to this pain he was unable to bear weight and as then sent back to ED for evaluation.   ED Course:  Vitals: afeb, bp 154/54, hr 81, rr 18, sat 96% on ra ON evaluation in ED patient underwent MRI which noted Meniscal tear.  Due to  patients need for pain control and current mobility status patient was readmitted to hospitalist service for further care.   Imaging: IMPRESSION: 1. No acute osseous abnormality. 2. Small suprapatellar joint  effusion containing air, which may be postsurgical versus residual infection.  Labs: Wbc 6, hgb 10.5 (12.3) Plt 302 Na 134, K 3.7, glu 124 cr 0.75   IMPRESSION: 1. Large left knee joint effusion with low-level marrow edema in the proximal tibia and distal femur as well as muscle edema in the popliteus muscle. Overall findings are concerning for septic arthritis until proven otherwise. Recommend further evaluation with arthrocentesis. 2. Horizontal tear of the anterior horn and body of the lateral meniscus extending to the free edge. Small undersurface tear of the posterior horn of the lateral meniscus. 3. Cartilage fissuring of the trochlear groove. Mild partial-thickness cartilage loss of the lateral trochlea and lateral patellar facet. 4. Partial thickness cartilage loss of the lateral femorotibial compartment. 5. Muscle edema in the popliteus muscle.   Tx ancef 2gram/dilaudid,oxycodone  Review of Systems: As per HPI otherwise 10 point review of systems negative.   Past Medical History:  Diagnosis Date   Eye abnormalities    GERD (gastroesophageal reflux disease)    Hypertension    Kidney stones    Prostate cancer (HCC)    Throat cancer [facility])     Past Surgical History:  Procedure Laterality Date   CAROTID STENT     EYE SURGERY     I & D EXTREMITY Left 03/15/2023   Procedure: IRRIGATION AND DEBRIDEMENT LEFT KNEE;  Surgeon: Roby Lofts, MD;  Location: [facility] OR;  Service: Orthopedics;  Laterality: Left;   TONSILLECTOMY       reports that he has quit smoking. His smoking use  included cigarettes. He started smoking about 59 years ago. He has a 812.4 pack-year smoking history. He has been exposed to tobacco smoke. He has never used smokeless tobacco. He reports that he does not drink alcohol and does not use drugs.  No Known Allergies  Family History  Problem Relation Age of Onset   Stroke Mother    Heart attack Mother    Heart disease Brother     Prior to  Admission medications   Medication Sig Start Date End Date Taking? Authorizing Provider  acetaminophen (TYLENOL) 500 MG tablet Take 1,000 mg by mouth 2 (two) times daily as needed for mild pain or moderate pain.    [provider]  allopurinol (ZYLOPRIM) 100 MG tablet Take 1 tablet (100 mg total) by mouth daily. 03/05/23   Fuller Plan, MD  amLODipine (NORVASC) 10 MG tablet Take 10 mg by mouth daily.    [provider]  ascorbic acid (VITAMIN C) 500 MG tablet Take 1,000 mg by mouth daily.    [provider]  aspirin EC 325 MG tablet Take 325 mg by mouth daily.    [provider]  atorvastatin (LIPITOR) 40 MG tablet Take 40 mg by mouth every evening.    [provider]  brimonidine (ALPHAGAN) 0.2 % ophthalmic solution Place 1 drop into the right eye 2 (two) times daily.    [provider]  ceFAZolin (ANCEF) IVPB Inject 2 g into the vein every 8 (eight) hours. Indication:  MSSA septic knee First Dose: Yes Last Day of Therapy:  04/28/23 Labs - Once weekly:  CBC/D and BMP, ESR and CRP Method of administration: IV Push Pull PICC line at the completion of IV antibiotic therapy Method of administration may be changed at the discretion of home infusion pharmacist based upon assessment of the patient and/or caregiver's ability to self-administer the medication ordered. 03/20/23 04/28/23  Randall Hiss, MD  docusate sodium (COLACE) 100 MG capsule Take 1 capsule (100 mg total) by mouth 2 (two) times daily. 03/20/23   Lanae Boast, MD  fluticasone (FLONASE) 50 MCG/ACT nasal spray Place 2 sprays into the nose daily as needed for allergies.    [provider]  HYDROcodone-acetaminophen (NORCO) 5-325 MG tablet Take 1 tablet by mouth every 6 (six) hours as needed for up to 5 days for severe pain. 03/20/23 03/25/23  Lanae Boast, MD  Iron Combinations (IRON COMPLEX PO) Take 1 tablet by mouth daily.    [provider]  levothyroxine  (SYNTHROID) 100 MCG tablet Take 100 mcg by mouth daily.    [provider]  lisinopril (ZESTRIL) 5 MG tablet Take 5 mg by mouth 2 (two) times daily.    [provider]  Multiple Vitamins-Minerals (ONE-A-DAY 50 PLUS PO) Take 1 tablet by mouth daily.    [provider]  Omega-3 Fatty Acids (FISH OIL) 1200 MG CAPS Take 1,200 mg by mouth daily.    [provider]  polyethylene glycol powder (GLYCOLAX/MIRALAX) 17 GM/SCOOP powder Mix as directed and take 1 capful (17 g) by mouth daily as needed for mild constipation. 03/20/23   Lanae Boast, MD  senna-docusate (SENOKOT-S) 8.6-50 MG tablet Take 1 tablet by mouth at bedtime as needed for mild constipation. 03/20/23   Lanae Boast, MD  tamsulosin (FLOMAX) 0.4 MG CAPS capsule Take 0.4 mg by mouth daily.    [provider]    Physical Exam: Vitals:   03/21/23 0500 03/21/23 0611 03/21/23 0927 03/21/23 1220  BP: Marland Kitchen)  181/66 131/64 (!) 155/70 (!) 160/68  Pulse: 66 84 79 79  Resp: 15 15 16 17   Temp:   97.8 F (36.6 C) 97.6 F (36.4 C)  TempSrc:   Oral Oral  SpO2: 98% 99% 99% 99%    Constitutional: NAD, calm, comfortable Vitals:   03/21/23 0500 03/21/23 0611 03/21/23 0927 03/21/23 1220  BP: (!) 181/66 131/64 (!) 155/70 (!) 160/68  Pulse: 66 84 79 79  Resp: 15 15 16 17   Temp:   97.8 F (36.6 C) 97.6 F (36.4 C)  TempSrc:   Oral Oral  SpO2: 98% 99% 99% 99%   Eyes: PERRL, lids and conjunctivae normal ENMT: Mucous membranes are moist. Posterior pharynx clear of any exudate or lesions.Normal dentition.  Neck: normal, supple, no masses, no thyromegaly Respiratory: clear to auscultation bilaterally, no wheezing, no crackles. Normal respiratory effort. No accessory muscle use.  Cardiovascular: Regular rate and rhythm, no murmurs / rubs / gallops. No extremity edema. 2+ pedal pulses. No carotid bruits.  Abdomen: no tenderness, no masses palpated. No hepatosplenomegaly. Bowel sounds positive.  Musculoskeletal: no  clubbing / cyanosis. No joint deformity upper and lower extremities. Good ROM, no contractures. Normal muscle tone.  Skin: no rashes, lesions, ulcers. No induration Neurologic: CN 2-12 grossly intact. Sensation intact, DTR normal. Strength 5/5 in all 4.  Psychiatric: Normal judgment and insight. Alert and oriented x 3. Normal mood.    Labs on Admission: I have personally reviewed following labs and imaging studies  CBC: Recent Labs  Lab 03/15/23 0359 03/16/23 0112 03/17/23 0154 03/18/23 0014 03/19/23 0303 03/20/23 0607 03/21/23 0656  WBC 8.6   < > 8.7 7.0 6.8 6.5 6.0  NEUTROABS 7.1  --   --   --   --   --  4.7  HGB 12.2*   < > 11.4* 11.3* 10.9* 12.3* 10.5*  HCT 37.3*   < > 36.0* 35.3* 34.1* 38.3* 32.1*  MCV 87.1   < > 90.5 87.4 90.2 88.2 88.9  PLT 357   < > 376 344 297 291 302   < > = values in this interval not displayed.   Basic Metabolic Panel: Recent Labs  Lab 03/17/23 1216 03/18/23 0014 03/19/23 0303 03/20/23 0607 03/21/23 0656  NA 136 133* 133* 135 134*  K 3.6 3.7 3.9 3.6 3.7  CL 100 101 100 102 102  CO2 24 24 25 24 25   GLUCOSE 158* 115* 120* 102* 124*  BUN 12 13 12 9 14   CREATININE 0.78 0.76 0.81 0.69 0.75  CALCIUM 8.3* 8.1* 8.1* 8.4* 8.4*   GFR: Estimated Creatinine Clearance: 99.3 mL/min (by C-G formula based on SCr of 0.75 mg/dL). Liver Function Tests: Recent Labs  Lab 03/16/23 0112 03/17/23 1216 03/18/23 0014 03/19/23 0303 03/20/23 0607  AST 22 26 23 23 30   ALT 31 19 14 11 13   ALKPHOS 105 101 96 97 111  BILITOT 0.2* 0.2* 0.3 0.4 0.3  PROT 5.2* 5.1* 5.2* 5.2* 5.9*  ALBUMIN 1.6* 1.7* 1.7* 1.7* 1.9*   No results for input(s): "LIPASE", "AMYLASE" in the last 168 hours. No results for input(s): "AMMONIA" in the last 168 hours. Coagulation Profile: Recent Labs  Lab 03/15/23 0302  INR 1.1   Cardiac Enzymes: No results for input(s): "CKTOTAL", "CKMB", "CKMBINDEX", "TROPONINI" in the last 168 hours. BNP (last 3 results) No results for input(s):  "PROBNP" in the last 8760 hours. HbA1C: No results for input(s): "HGBA1C" in the last 72 hours. CBG: No results for input(s): "GLUCAP" in  the last 168 hours. Lipid Profile: No results for input(s): "CHOL", "HDL", "LDLCALC", "TRIG", "CHOLHDL", "LDLDIRECT" in the last 72 hours. Thyroid Function Tests: No results for input(s): "TSH", "T4TOTAL", "FREET4", "T3FREE", "THYROIDAB" in the last 72 hours. Anemia Panel: No results for input(s): "VITAMINB12", "FOLATE", "FERRITIN", "TIBC", "IRON", "RETICCTPCT" in the last 72 hours. Urine analysis:    Component Value Date/Time   COLORURINE YELLOW 02/20/2023 1043   APPEARANCEUR CLEAR 02/20/2023 1043   LABSPEC 1.013 02/20/2023 1043   PHURINE 6.0 02/20/2023 1043   GLUCOSEU NEGATIVE 02/20/2023 1043   HGBUR NEGATIVE 02/20/2023 1043   BILIRUBINUR NEGATIVE 01/29/2023 2209   KETONESUR NEGATIVE 02/20/2023 1043   PROTEINUR TRACE (A) 02/20/2023 1043   UROBILINOGEN 1.0 01/06/2012 1132   NITRITE NEGATIVE 02/20/2023 1043   LEUKOCYTESUR 3+ (A) 02/20/2023 1043    Radiological Exams on Admission: MR KNEE LEFT WO CONTRAST  Result Date: 03/21/2023 CLINICAL DATA:  History of septic arthritis, left knee pain EXAM: MRI OF THE LEFT KNEE WITHOUT CONTRAST TECHNIQUE: Multiplanar, multisequence MR imaging of the knee was performed. No intravenous contrast was administered. COMPARISON:  None Available. FINDINGS: MENISCI Medial: Intact. Lateral: Horizontal tear of the anterior horn and body of the lateral meniscus extending to the free edge. Small undersurface tear of the posterior horn of the lateral meniscus (image 23/series 35). LIGAMENTS Cruciates: ACL and PCL are intact. Collaterals: Medial collateral ligament is intact. Lateral collateral ligament complex is intact. CARTILAGE Patellofemoral: Cartilage fissuring of the trochlear groove. Mild partial-thickness cartilage loss of the lateral trochlea and lateral patellar facet. Medial:  No chondral defect. Lateral: Partial  thickness cartilage loss of the lateral femorotibial compartment. JOINT: Large joint effusion. Mild edema in Hoffa's fat. No plical thickening. POPLITEAL FOSSA: Popliteus tendon is intact. No Baker's cyst. EXTENSOR MECHANISM: Intact quadriceps tendon. Intact patellar tendon. Intact lateral patellar retinaculum. Intact medial patellar retinaculum. Intact MPFL. BONES: No aggressive osseous lesion. No fracture or dislocation. Subcortical marrow edema at the PCL origin and insertion and along the roof of the intercondylar notch. Bone island in the distal lateral femoral condyle. Other: Muscle edema in the popliteus muscle.  No muscle atrophy. IMPRESSION: 1. Large left knee joint effusion with low-level marrow edema in the proximal tibia and distal femur as well as muscle edema in the popliteus muscle. Overall findings are concerning for septic arthritis until proven otherwise. Recommend further evaluation with arthrocentesis. 2. Horizontal tear of the anterior horn and body of the lateral meniscus extending to the free edge. Small undersurface tear of the posterior horn of the lateral meniscus. 3. Cartilage fissuring of the trochlear groove. Mild partial-thickness cartilage loss of the lateral trochlea and lateral patellar facet. 4. Partial thickness cartilage loss of the lateral femorotibial compartment. 5. Muscle edema in the popliteus muscle. Electronically Signed   By: Elige Ko M.D.   On: 03/21/2023 10:48   DG Knee Complete 4 Views Left  Result Date: 03/21/2023 CLINICAL DATA:  Left knee pain, status post knee surgery on Friday. History of debridement for staph infection. Twisted left knee today getting out of a chair. EXAM: LEFT KNEE - COMPLETE 4+ VIEW COMPARISON:  03/11/2023. FINDINGS: No acute fracture or dislocation. Minimal degenerative changes are present. No periosteal elevation or bony erosions. There is a suprapatellar joint effusion containing air. Vascular calcifications are noted in the soft  tissues. IMPRESSION: 1. No acute osseous abnormality. 2. Small suprapatellar joint effusion containing air, which may be postsurgical versus residual infection. Electronically Signed   By: Charlestine Night.D.  On: 03/21/2023 02:15   [place] EKG SITE RITE  Result Date: 03/20/2023 If Site Rite image not attached, placement could not be confirmed due to current cardiac rhythm.   EKG: Independently reviewed.   Assessment/Plan  Septic Arthritis POA complicated by lateral meniscal tear -pain due to this development now required further pain control as well as rehab due to current mobility status -admit for PT/OT  and pain control  - continue with Ancef for six weeks course as previously prescribed   Gout  -continue allopurinol  -s/p flare tx with colchicine  Prediabetes -last A1c 5.9  - monitor FS  HLD -continue statin   Normocytic anemia -slight drop but remain close to baseline  -continue to monitor   BPH  -continue flomax   Hypothyroidism -continue synthroid      DVT prophylaxis: scd Code Status: full/ as discussed per patient wishes in event of cardiac arrest  Family Communication:  Disposition Plan:patient  expected to be admitted greater than 2 midnights May need rehab placement / case work to evaluate Consults called: pt/ot/case work Admission status: med tele   Lurline Del MD [facility]  If 7PM-7AM, please contact night-coverage [email or web address] Password [facility]  03/21/2023, 2:14 PM

## 2023-03-22 DIAGNOSIS — M009 Pyogenic arthritis, unspecified: Secondary | ICD-10-CM

## 2023-03-22 DIAGNOSIS — S83242A Other tear of medial meniscus, current injury, left knee, initial encounter: Secondary | ICD-10-CM

## 2023-03-22 DIAGNOSIS — M25562 Pain in left knee: Secondary | ICD-10-CM | POA: Diagnosis not present

## 2023-03-22 LAB — GLUCOSE, CAPILLARY
Glucose-Capillary: 121 mg/dL — ABNORMAL HIGH (ref 70–99)
Glucose-Capillary: 195 mg/dL — ABNORMAL HIGH (ref 70–99)
Glucose-Capillary: 130 mg/dL — ABNORMAL HIGH (ref 70–99)
Glucose-Capillary: 127 mg/dL — ABNORMAL HIGH (ref 70–99)

## 2023-03-22 LAB — COMPREHENSIVE METABOLIC PANEL
ALT: 9 U/L (ref 0–44)
AST: 21 U/L (ref 15–41)
Albumin: 1.8 g/dL — ABNORMAL LOW (ref 3.5–5.0)
Alkaline Phosphatase: 110 U/L (ref 38–126)
Anion gap: 8 (ref 5–15)
BUN: 8 mg/dL (ref 8–23)
CO2: 25 mmol/L (ref 22–32)
Calcium: 8.5 mg/dL — ABNORMAL LOW (ref 8.9–10.3)
Chloride: 102 mmol/L (ref 98–111)
Creatinine, Ser: 0.64 mg/dL (ref 0.61–1.24)
GFR, Estimated: >60 mL/min (ref >60)
Glucose, Bld: 111 mg/dL — ABNORMAL HIGH (ref 70–99)
Potassium: 3.9 mmol/L (ref 3.5–5.1)
Sodium: 135 mmol/L (ref 135–145)
Total Bilirubin: 0.7 mg/dL (ref 0.3–1.2)
Total Protein: 5.7 g/dL — ABNORMAL LOW (ref 6.5–8.1)

## 2023-03-22 LAB — URINALYSIS, W/ REFLEX TO CULTURE (INFECTION SUSPECTED)
Bacteria, UA: NONE SEEN
Bilirubin Urine: NEGATIVE
Glucose, UA: NEGATIVE mg/dL
Hgb urine dipstick: NEGATIVE
Ketones, ur: 20 mg/dL — AB
Leukocytes,Ua: NEGATIVE
Nitrite: NEGATIVE
Protein, ur: NEGATIVE mg/dL
Specific Gravity, Urine: 1.013 (ref 1.005–1.030)
pH: 7 (ref 5.0–8.0)

## 2023-03-22 LAB — CBC
HCT: 33.2 % — ABNORMAL LOW (ref 39.0–52.0)
Hemoglobin: 10.9 g/dL — ABNORMAL LOW (ref 13.0–17.0)
MCH: 28.5 pg (ref 26.0–34.0)
MCHC: 32.8 g/dL (ref 30.0–36.0)
MCV: 86.9 fL (ref 80.0–100.0)
Platelets: 295 10*3/uL (ref 150–400)
RBC: 3.82 MIL/uL — ABNORMAL LOW (ref 4.22–5.81)
RDW: 13.3 % (ref 11.5–15.5)
WBC: 6 10*3/uL (ref 4.0–10.5)
nRBC: 0 % (ref 0.0–0.2)

## 2023-03-22 LAB — URINALYSIS, ROUTINE W REFLEX MICROSCOPIC
Bilirubin Urine: NEGATIVE
Glucose, UA: NEGATIVE mg/dL
Hgb urine dipstick: NEGATIVE
Ketones, ur: 5 mg/dL — AB
Leukocytes,Ua: NEGATIVE
Nitrite: NEGATIVE
Protein, ur: NEGATIVE mg/dL
Specific Gravity, Urine: 1.014 (ref 1.005–1.030)
pH: 7 (ref 5.0–8.0)

## 2023-03-22 MED ORDER — BISACODYL 10 MG RE SUPP
10.0000 mg | Freq: Once | RECTAL | Status: AC
  Administered 2023-03-22: 10 mg via RECTAL
  Filled 2023-03-22: qty 1

## 2023-03-22 NOTE — Progress Notes (Signed)
Urine sample did not receive per lab. Pt with increase urgency/frequency with pain in the bladder. Urine sample recollected and sent to the lab.

## 2023-03-22 NOTE — Evaluation (Signed)
Physical Therapy Evaluation Patient Details Name: Dean Mayer MRN: [id number] DOB: 11-25-1940 Today's Date: 03/22/2023  History of Present Illness  82 y.o. male [facility]. Pt was discharging on same date and twisted L knee when attempting to enter car, with significant increase in pain. L knee was recently treated for sepsis. Pt found to have lateral meniscus tear on MRI. PMH includes HTN, throat and prostate CA.  Clinical Impression  Pt presents to PT with deficits in functional mobility, strength, power, endurance, gait, and with significant L knee pain. PT notes conflicting mobility orders in chart, most recent order for progressive mobility without restriction and prior order to immobilize LLE. PT reaches out to attending for clarification on potential mobility restrictions. Pt with significant pain with passive knee flexion when sitting at edge of bed. PT defers progression of mobility due to uncertainty of mobility restrictions and lack of knee immobilizer. PT will follow up after further recommendations. Pt likely will benefit from short term inpatient PT placement at the time of discharge.      If plan is discharge home, recommend the following: A lot of help with walking and/or transfers;A lot of help with bathing/dressing/bathroom;Assistance with cooking/housework;Assist for transportation;Help with stairs or ramp for entrance   Can travel by private vehicle   No    Equipment Recommendations Wheelchair (measurements PT)  Recommendations for Other Services       Functional Status Assessment Patient has had a recent decline in their functional status and demonstrates the ability to make significant improvements in function in a reasonable and predictable amount of time.     Precautions / Restrictions Precautions Precautions: Fall Precaution Comments: conflicting mobility orders, seeking clarification from MD. Most recent order to progress mobility  without restrictions. Order from 8/14 to immobilize LLE, although no knee immobilizer present on 8/16. Restrictions Weight Bearing Restrictions: Yes LLE Weight Bearing: Weight bearing as tolerated      Mobility  Bed Mobility Overal bed mobility: Needs Assistance Bed Mobility: Supine to Sit, Sit to Supine     Supine to sit: Supervision, HOB elevated, Used rails Sit to supine: Min assist        Transfers Overall transfer level:  (deferred due to significant pain in sitting as well as conflicting orders on LLE mobility.)                      Ambulation/Gait                  Stairs            Wheelchair Mobility     Tilt Bed    Modified Rankin (Stroke Patients Only)       Balance Overall balance assessment: Needs assistance Sitting-balance support: No upper extremity supported, Feet supported Sitting balance-Leahy Scale: Good                                       Pertinent Vitals/Pain Pain Assessment Pain Assessment: 0-10 Pain Score: 10-Worst pain ever Pain Location: L knee Pain Descriptors / Indicators: Aching Pain Intervention(s): Limited activity within patient's tolerance    Home Living Family/patient expects to be discharged to:: Private residence Living Arrangements: Spouse/significant other Available Help at Discharge: Family;Available 24 hours/day Type of Home: House Home Access: Stairs to enter Entrance Stairs-Rails: Right Entrance Stairs-Number of Steps: 4   Home Layout: Two level;Able  to live on main level with bedroom/bathroom Home Equipment: Agricultural consultant (2 wheels)      Prior Function Prior Level of Function : Independent/Modified Independent;Driving             Mobility Comments: uses RW, 2 weeks ago was not using anything ADLs Comments: "very independent"     Extremity/Trunk Assessment   Upper Extremity Assessment Upper Extremity Assessment: Overall WFL for tasks assessed    Lower  Extremity Assessment Lower Extremity Assessment: LLE deficits/detail LLE Deficits / Details: ankle PF/DF 4-/5, knee ROM significantly limited by pain. pt with significant pain with passive knee flexion to ~80 degrees when sitting at edge of bed, further ROM assessment deferred.    Cervical / Trunk Assessment Cervical / Trunk Assessment: Normal  Communication   Communication Communication: No apparent difficulties Cueing Techniques: Verbal cues  Cognition Arousal: Alert Behavior During Therapy: WFL for tasks assessed/performed Overall Cognitive Status: Within Functional Limits for tasks assessed                                          General Comments General comments (skin integrity, edema, etc.): VSS on RA    Exercises General Exercises - Lower Extremity Ankle Circles/Pumps: AROM, Both, 10 reps Quad Sets: AROM, Left, 10 reps Gluteal Sets: AROM, Both, 10 reps   Assessment/Plan    PT Assessment Patient needs continued PT services  PT Problem List Decreased strength;Decreased activity tolerance;Decreased balance;Decreased mobility;Decreased knowledge of use of DME;Pain       PT Treatment Interventions DME instruction;Gait training;Stair training;Functional mobility training;Therapeutic activities;Therapeutic exercise;Balance training;Neuromuscular re-education;Patient/family education;Wheelchair mobility training    PT Goals (Current goals can be found in the Care Plan section)  Acute Rehab PT Goals Patient Stated Goal: to reduce pain, return to independent mobility PT Goal Formulation: With patient Time For Goal Achievement: 04/05/23 Potential to Achieve Goals: Fair    Frequency Min 1X/week     Co-evaluation               AM-PAC PT "6 Clicks" Mobility  Outcome Measure Help needed turning from your back to your side while in a flat bed without using bedrails?: A Little Help needed moving from lying on your back to sitting on the side of a flat  bed without using bedrails?: A Little Help needed moving to and from a bed to a chair (including a wheelchair)?: Total Help needed standing up from a chair using your arms (e.g., wheelchair or bedside chair)?: Total Help needed to walk in hospital room?: Total Help needed climbing 3-5 steps with a railing? : Total 6 Click Score: 10    End of Session   Activity Tolerance: Patient limited by pain Patient left: in bed;with call bell/phone within reach;with bed alarm set Nurse Communication: Mobility status PT Visit Diagnosis: Other abnormalities of gait and mobility (R26.89);Muscle weakness (generalized) (M62.81);Pain Pain - Right/Left: Left Pain - part of body: Knee    Time: 4132-4401 PT Time Calculation (min) (ACUTE ONLY): 34 min   Charges:   PT Evaluation $PT Eval Low Complexity: 1 Low   PT General Charges $$ ACUTE PT VISIT: 1 Visit         Arlyss Gandy, PT, DPT Acute Rehabilitation Office [phone]   Arlyss Gandy 03/22/2023, 1:43 PM

## 2023-03-22 NOTE — TOC Initial Note (Signed)
Transition of Care [facility]) - Initial/Assessment Note    Patient Details  Name: Dean Mayer MRN: [id number] Date of Birth: 06/23/1941  Transition of Care [facility]) CM/SW Contact:    Lorri Frederick, LCSW Phone Number: 03/22/2023, 4:11 PM  Clinical Narrative:     CSW spoke with pt wife Boneta Lucks by phone, she provided information.  Pt from home with Boneta Lucks, no current services.  Pt is in agreement with SNF, per Boneta Lucks.  But would also like to be considered for CIR, which would be first choice.  Permission given to pursue both options and sent out referral in hub.  CSW reached out to CIR/Caitlin to review.               Expected Discharge Plan: Skilled Nursing Facility Barriers to Discharge: Continued Medical Work up, SNF Pending bed offer   Patient Goals and CMS Choice Patient states their goals for this hospitalization and ongoing recovery are:: get back to normal activities          Expected Discharge Plan and Services In-house Referral: Clinical Social Work   Post Acute Care Choice: Skilled Nursing Facility Living arrangements for the past 2 months: Single Family Home                                      Prior Living Arrangements/Services Living arrangements for the past 2 months: Single Family Home Lives with:: Spouse          Need for Family Participation in Patient Care: Yes (Comment) Care giver support system in place?: Yes (comment) Current home services: Other (comment) (none)    Activities of Daily Living      Permission Sought/Granted                  Emotional Assessment       Orientation: : Oriented to Self, Oriented to Place, Oriented to  Time, Oriented to Situation      Admission diagnosis:  Painful knee [M25.569] Acute medial meniscus tear of left knee, initial encounter [S83.242A] Pyogenic arthritis of left knee joint, due to unspecified organism [facility]) [M00.9] Patient Active Problem List   Diagnosis Date Noted   Painful knee  03/21/2023   MSSA bacteremia 03/18/2023   Acute gout of right ankle 03/18/2023   Bacterial infection of knee joint (HCC) 03/15/2023   Swelling of right foot 02/20/2023   Sedimentation rate elevation 02/20/2023   Positive ANA (antinuclear antibody) 02/20/2023   Peripheral arterial disease (HCC) 12/04/2022   Cellulitis of right lower extremity 12/03/2022   Peripheral edema 12/02/2022   Cellulitis of right leg 12/01/2022   Hypertension 12/01/2022   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   Dyspnea on exertion 12/01/2022   Hypothyroidism 12/01/2022   Carotid stenosis 08/29/2021   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   Malignant neoplasm of prostate (HCC) 05/28/2018   Primary open angle glaucoma (POAG) of right eye, severe stage 09/16/2017   Lacunar infarct, acute (HCC) 01/05/2016   Confusion 01/04/2016   Hypertensive urgency 01/04/2016   Left-sided weakness 01/04/2016   Stroke (HCC) 01/04/2016   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   Basal cell carcinoma of left eyelid 05/09/2015   Carcinoma of left eyelid 04/25/2015   Acne rosacea 07/26/2014   Eyelid lesion 07/26/2014   Primary open angle glaucoma of both eyes 02/23/2013   Visual field  defect 02/23/2013   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 [place] [address] Phone: [phone] Fax: [phone]     Social Determinants of Health (SDOH) Social History: SDOH Screenings   Food Insecurity: No Food Insecurity (12/01/2022)  Housing: Low Risk  (12/01/2022)   Transportation Needs: No Transportation Needs (12/01/2022)  Utilities: Not At Risk (12/01/2022)  Financial Resource Strain: Low Risk  (11/19/2021)   Received from [facility]  Physical Activity: Inactive (11/19/2021)   Received from [facility], [facility]  Social Connections: Unknown (11/26/2022)   Received from [facility], [facility]  Stress: No Stress Concern Present (03/16/2022)   Received from [facility], [facility]  Tobacco Use: Medium Risk (03/20/2023)   SDOH Interventions:     Readmission Risk Interventions     No data to display

## 2023-03-22 NOTE — Progress Notes (Signed)
[facility]  PROGRESS NOTE  JAEL KRAUSE MVH:[id number] DOB: Dec 27, 1940 DOA: 03/20/2023 PCP: Angelique Blonder, MD   Brief HPI:   82 y.o. male with medical history significant of  82yom w/ history of gout, HTN, HLD, BPH, hypothyroidism, throat cancer, positive ANA, positive rheumatoid factor who on 7/29 saw rheumatologist with complaints of left knee pain and swelling.  Fluid analysis at that time was consistent with gout.  He was treated with intra-articular steroid injection. initial tap 7/29 cultures grew MSS that was pan-sensitive. Patient discussed with orthopedics who recommended admission for diagnosis of MSSA left knee septic arthritis. During hospitalization patient underwent I/D of left knee. Patient there after was prescribed 6 week course of IV antibiotics with out patient f/u with orthopedics. Patient on discharged today, in attempt to transfer from wheel chair to his car pivoted and at that point has acute intense pain. Due to this pain he was unable to bear weight and as then sent back to ED for evaluation.  MRI of left knee showed horizontal tear of the anterior horn and body of the lateral meniscus extending to the free edge.   Assessment/Plan:   Septic arthritis, POA, complicated by lateral meniscus tear -Currently on IV Ancef for 6 weeks -Consulted Dr. Jena Gauss; no plan for treatment for meniscus tear -He will see patient in the hospital -Will likely need rehab  Gout -Continue allopurinol -S/p treatment for flareup with colchicine  Hyperlipidemia -Continue statin  Normocytic anemia -Hemoglobin is stable  Hypothyroidism -Continue Synthroid  BPH -Continue tamsulosin  Hypertension -Continue amlodipine, lisinopril -Blood pressure well-controlled    Medications     allopurinol  100 mg Oral Daily   amLODipine  10 mg Oral Daily   ascorbic acid  1,000 mg Oral Daily   aspirin EC  325 mg Oral Daily   atorvastatin  40 mg Oral QPM   docusate sodium   100 mg Oral BID   heparin  5,000 Units Subcutaneous Q8H   levothyroxine  100 mcg Oral Q0600   lisinopril  5 mg Oral BID   tamsulosin  0.4 mg Oral Daily     Data Reviewed:   CBG:  Recent Labs  Lab 03/21/23 1657 03/21/23 2211 03/22/23 0809 03/22/23 1157 03/22/23 1658  GLUCAP 137* 119* 121* 195* 130*    SpO2: 96 %    Vitals:   03/21/23 1924 03/22/23 0500 03/22/23 0811 03/22/23 1334  BP: (!) 158/57  (!) 150/66 (!) 144/67  Pulse: 81  76 83  Resp:   17 16  Temp: 98.3 F (36.8 C)  98.6 F (37 C) 98.2 F (36.8 C)  TempSrc: Oral  Oral Oral  SpO2: 95%  97% 96%  Weight:  107.3 kg        Data Reviewed:  Basic Metabolic Panel: Recent Labs  Lab 03/18/23 0014 03/19/23 0303 03/20/23 0607 03/21/23 0656 03/21/23 1511 03/22/23 0455  NA 133* 133* 135 134*  --  135  K 3.7 3.9 3.6 3.7  --  3.9  CL 101 100 102 102  --  102  CO2 24 25 24 25   --  25  GLUCOSE 115* 120* 102* 124*  --  111*  BUN 13 12 9 14   --  8  CREATININE 0.76 0.81 0.69 0.75 0.74 0.64  CALCIUM 8.1* 8.1* 8.4* 8.4*  --  8.5*    CBC: Recent Labs  Lab 03/19/23 0303 03/20/23 0607 03/21/23 0656 03/21/23 1511 03/22/23 0455  WBC 6.8 6.5 6.0 6.2 6.0  NEUTROABS  --   --  4.7  --   --   HGB 10.9* 12.3* 10.5* 11.9* 10.9*  HCT 34.1* 38.3* 32.1* 36.3* 33.2*  MCV 90.2 88.2 88.9 88.5 86.9  PLT 297 291 302 288 295    LFT Recent Labs  Lab 03/17/23 1216 03/18/23 0014 03/19/23 0303 03/20/23 0607 03/22/23 0455  AST 26 23 23 30 21   ALT 19 14 11 13 9   ALKPHOS 101 96 97 111 110  BILITOT 0.2* 0.3 0.4 0.3 0.7  PROT 5.1* 5.2* 5.2* 5.9* 5.7*  ALBUMIN 1.7* 1.7* 1.7* 1.9* 1.8*     Antibiotics: Anti-infectives (From admission, onward)    Start     Dose/Rate Route Frequency Ordered Stop   03/21/23 1545  ceFAZolin (ANCEF) IVPB  Status:  Discontinued       Note to Pharmacy: Indication:  MSSA septic knee First Dose: Yes Last Day of Therapy:  04/28/23 Labs - Once weekly:  CBC/D and BMP, ESR and CRP Method of  administration: IV Push Pull PICC line at the completion of IV antibiotic therapy Method of administration may be changed at the discretion of home infusion pha   2 g Intravenous Every 8 hours 03/21/23 1445 03/21/23 1510   03/21/23 0230  ceFAZolin (ANCEF) IVPB 2g/100 mL premix        2 g 200 mL/hr over 30 Minutes Intravenous Every 8 hours 03/21/23 0220          DVT prophylaxis: SCDS  Code Status: Full code  Family Communication: Discussed with patient wife at bedside   CONSULTS    Subjective   Patient seen and examined, still complains of left knee pain   Objective    Physical Examination:   General-appears in no acute distress Heart-S1-S2, regular, no murmur auscultated Lungs-clear to auscultation bilaterally, no wheezing or crackles auscultated Abdomen-soft, nontender, no organomegaly Extremities-left knee Ace bandage Neuro-alert, oriented x3, no focal deficit noted  Status is: Inpatient:             Meredeth Ide   [facility] If 7PM-7AM, please contact night-coverage at [email or web address], Office  [phone]   03/22/2023, 6:19 PM  LOS: 1 day

## 2023-03-23 DIAGNOSIS — M25562 Pain in left knee: Secondary | ICD-10-CM | POA: Diagnosis not present

## 2023-03-23 DIAGNOSIS — M009 Pyogenic arthritis, unspecified: Secondary | ICD-10-CM | POA: Diagnosis not present

## 2023-03-23 LAB — COMPREHENSIVE METABOLIC PANEL
ALT: 8 U/L (ref 0–44)
AST: 22 U/L (ref 15–41)
Albumin: 1.7 g/dL — ABNORMAL LOW (ref 3.5–5.0)
Alkaline Phosphatase: 92 U/L (ref 38–126)
Anion gap: 10 (ref 5–15)
BUN: 9 mg/dL (ref 8–23)
CO2: 25 mmol/L (ref 22–32)
Calcium: 8.3 mg/dL — ABNORMAL LOW (ref 8.9–10.3)
Chloride: 99 mmol/L (ref 98–111)
Creatinine, Ser: 0.59 mg/dL — ABNORMAL LOW (ref 0.61–1.24)
GFR, Estimated: >60 mL/min (ref >60)
Glucose, Bld: 140 mg/dL — ABNORMAL HIGH (ref 70–99)
Potassium: 3.8 mmol/L (ref 3.5–5.1)
Sodium: 134 mmol/L — ABNORMAL LOW (ref 135–145)
Total Bilirubin: 0.4 mg/dL (ref 0.3–1.2)
Total Protein: 5.3 g/dL — ABNORMAL LOW (ref 6.5–8.1)

## 2023-03-23 LAB — GLUCOSE, CAPILLARY
Glucose-Capillary: 121 mg/dL — ABNORMAL HIGH (ref 70–99)
Glucose-Capillary: 162 mg/dL — ABNORMAL HIGH (ref 70–99)
Glucose-Capillary: 116 mg/dL — ABNORMAL HIGH (ref 70–99)
Glucose-Capillary: 123 mg/dL — ABNORMAL HIGH (ref 70–99)

## 2023-03-23 LAB — CBC
HCT: 32.5 % — ABNORMAL LOW (ref 39.0–52.0)
Hemoglobin: 10.8 g/dL — ABNORMAL LOW (ref 13.0–17.0)
MCH: 29.2 pg (ref 26.0–34.0)
MCHC: 33.2 g/dL (ref 30.0–36.0)
MCV: 87.8 fL (ref 80.0–100.0)
Platelets: 304 10*3/uL (ref 150–400)
RBC: 3.7 MIL/uL — ABNORMAL LOW (ref 4.22–5.81)
RDW: 13.2 % (ref 11.5–15.5)
WBC: 8.6 10*3/uL (ref 4.0–10.5)
nRBC: 0 % (ref 0.0–0.2)

## 2023-03-23 LAB — URINE CULTURE: Culture: <10000 — AB

## 2023-03-23 MED ORDER — SODIUM CHLORIDE 0.9% FLUSH
10.0000 mL | Freq: Two times a day (BID) | INTRAVENOUS | Status: DC
  Administered 2023-03-24: 20 mL
  Administered 2023-03-25 – 2023-03-27 (×5): 10 mL

## 2023-03-23 MED ORDER — CHLORHEXIDINE GLUCONATE CLOTH 2 % EX PADS
6.0000 | MEDICATED_PAD | Freq: Every day | CUTANEOUS | Status: DC
  Administered 2023-03-23 – 2023-03-27 (×5): 6 via TOPICAL

## 2023-03-23 MED ORDER — SODIUM CHLORIDE 0.9% FLUSH: 10.0000 mL | INTRAVENOUS | Status: DC | PRN

## 2023-03-23 NOTE — NC FL2 (Signed)
[place] MEDICAID FL2 LEVEL OF CARE FORM     IDENTIFICATION  Patient Name: Dean Mayer Birthdate: [birth year] Sex: male Admission Date (Current Location): 03/20/2023  [facility] and [place] Number:  Producer, television/film/video and Address:  [facility], [address], [place], [place] [address]      Provider Number: [id number]  Attending Physician Name and Address:  Meredeth Ide, MD  Relative Name and Phone Number:  Abe, Reine [phone]  [phone]    Current Level of Care: Hospital Recommended Level of Care: Skilled Nursing Facility Prior Approval Number:    Date Approved/Denied:   PASRR Number: [id number]  Discharge Plan: SNF    Current Diagnoses: Patient Active Problem List   Diagnosis Date Noted   Painful knee 03/21/2023   MSSA bacteremia 03/18/2023   Acute gout of right ankle 03/18/2023   Bacterial infection of knee joint (HCC) 03/15/2023   Swelling of right foot 02/20/2023   Sedimentation rate elevation 02/20/2023   Positive ANA (antinuclear antibody) 02/20/2023   Peripheral arterial disease (HCC) 12/04/2022   Cellulitis of right lower extremity 12/03/2022   Peripheral edema 12/02/2022   Cellulitis of right leg 12/01/2022   Hypertension 12/01/2022   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   Dyspnea on exertion 12/01/2022   Hypothyroidism 12/01/2022   Carotid stenosis 08/29/2021   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   Malignant neoplasm of prostate (HCC) 05/28/2018   Primary open angle glaucoma (POAG) of right eye, severe stage 09/16/2017   Lacunar infarct, acute (HCC) 01/05/2016   Confusion 01/04/2016   Hypertensive urgency 01/04/2016   Left-sided weakness 01/04/2016   Stroke (HCC) 01/04/2016   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   Basal cell carcinoma of left eyelid 05/09/2015   Carcinoma of left eyelid 04/25/2015   Acne rosacea 07/26/2014    Eyelid lesion 07/26/2014   Primary open angle glaucoma of both eyes 02/23/2013   Visual field defect 02/23/2013   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    Orientation RESPIRATION BLADDER Height & Weight     Self, Time, Situation, Place  Normal Continent Weight: 233 lb 4 oz (105.8 kg) Height:     BEHAVIORAL SYMPTOMS/MOOD NEUROLOGICAL BOWEL NUTRITION STATUS      Continent Diet (see discharge summary)  AMBULATORY STATUS COMMUNICATION OF NEEDS Skin   Extensive Assist Verbally Surgical wounds                       Personal Care Assistance Level of Assistance  Bathing, Feeding, Dressing Bathing Assistance: Maximum assistance Feeding assistance: Independent Dressing Assistance: Maximum assistance     Functional Limitations Info  Sight Sight Info: Impaired        SPECIAL CARE FACTORS FREQUENCY  PT (By licensed PT), OT (By licensed OT)     PT Frequency: 5x week OT Frequency: 5x week            Contractures Contractures Info: Not present    Additional Factors Info  Code Status, Allergies Code Status Info: full Allergies Info: NKA           Current Medications (03/23/2023):  This is the current hospital active medication list Current Facility-Administered Medications  Medication Dose Route Frequency Provider Last Rate Last Admin   acetaminophen (TYLENOL) tablet 1,000 mg  1,000 mg Oral BID PRN Lurline Del, MD       albuterol (PROVENTIL) (2.5 MG/3ML) 0.083%  nebulizer solution 2.5 mg  2.5 mg Nebulization Q2H PRN Lurline Del, MD       allopurinol (ZYLOPRIM) tablet 100 mg  100 mg Oral Daily Skip Mayer A, MD   100 mg at 03/22/23 0908   amLODipine (NORVASC) tablet 10 mg  10 mg Oral Daily Skip Mayer A, MD   10 mg at 03/22/23 1610   ascorbic acid (VITAMIN C) tablet 1,000 mg  1,000 mg Oral Daily Skip Mayer A, MD   1,000 mg at 03/22/23 9604   aspirin EC tablet 325 mg  325 mg Oral Daily Skip Mayer A, MD   325 mg at 03/22/23 0909    atorvastatin (LIPITOR) tablet 40 mg  40 mg Oral QPM Skip Mayer A, MD   40 mg at 03/22/23 1649   ceFAZolin (ANCEF) IVPB 2g/100 mL premix  2 g Intravenous Q8H Mesner, Jason, MD 200 mL/hr at 03/23/23 0603 2 g at 03/23/23 0603   Chlorhexidine Gluconate Cloth 2 % PADS 6 each  6 each Topical Daily Meredeth Ide, MD       docusate sodium (COLACE) capsule 100 mg  100 mg Oral BID Skip Mayer A, MD   100 mg at 03/22/23 2204   heparin injection 5,000 Units  5,000 Units Subcutaneous Q8H Lurline Del, MD   5,000 Units at 03/23/23 0604   HYDROcodone-acetaminophen (NORCO/VICODIN) 5-325 MG per tablet 1 tablet  1 tablet Oral Q6H PRN Lurline Del, MD       levothyroxine (SYNTHROID) tablet 100 mcg  100 mcg Oral Q0600 Skip Mayer A, MD   100 mcg at 03/23/23 0604   lisinopril (ZESTRIL) tablet 5 mg  5 mg Oral BID Skip Mayer A, MD   5 mg at 03/22/23 2204   ondansetron (ZOFRAN) tablet 4 mg  4 mg Oral Q6H PRN Lurline Del, MD       Or   ondansetron [facility]) injection 4 mg  4 mg Intravenous Q6H PRN Lurline Del, MD       oxyCODONE-acetaminophen (PERCOCET/ROXICET) 5-325 MG per tablet 2 tablet  2 tablet Oral Q4H PRN Mesner, Barbara Cower, MD   2 tablet at 03/23/23 0421   polyethylene glycol (MIRALAX / GLYCOLAX) packet 17 g  17 g Oral Daily PRN Lurline Del, MD       senna-docusate (Senokot-S) tablet 1 tablet  1 tablet Oral QHS PRN Lurline Del, MD   1 tablet at 03/22/23 2204   sodium chloride flush (NS) 0.9 % injection 10-40 mL  10-40 mL Intracatheter Q12H Lama, Sarina Ill, MD       sodium chloride flush (NS) 0.9 % injection 10-40 mL  10-40 mL Intracatheter PRN Meredeth Ide, MD       tamsulosin (FLOMAX) capsule 0.4 mg  0.4 mg Oral Daily Skip Mayer A, MD   0.4 mg at 03/22/23 0909     Discharge Medications: Please see discharge summary for a list of discharge medications.  Relevant Imaging Results:  Relevant Lab Results:   Additional Information SSN:  [id number]. Pt will require IV abx.  Marya Landry Dung Prien, LCSWA

## 2023-03-23 NOTE — Plan of Care (Signed)
  Problem: Education: Goal: Knowledge of General Education information will improve Description: Including pain rating scale, medication(s)/side effects and non-pharmacologic comfort measures Outcome: Progressing   Problem: Health Behavior/Discharge Planning: Goal: Ability to manage health-related needs will improve Outcome: Progressing   Problem: Nutrition: Goal: Adequate nutrition will be maintained Outcome: Progressing   Problem: Coping: Goal: Level of anxiety will decrease Outcome: Progressing   Problem: Elimination: Goal: Will not experience complications related to bowel motility Outcome: Progressing   Problem: Pain Managment: Goal: General experience of comfort will improve Outcome: Progressing

## 2023-03-23 NOTE — Evaluation (Signed)
Occupational Therapy Evaluation Patient Details Name: Dean Mayer MRN: [id number] DOB: 09-26-1940 Today's Date: 03/23/2023   History of Present Illness 82 y.o. male presents to [facility] hospital on 03/20/2023. Pt was discharging on same date and twisted L knee when attempting to enter car, with significant increase in pain. L knee was recently treated for sepsis. Pt found to have lateral meniscus tear on MRI. PMH includes HTN, throat and prostate CA.   Clinical Impression   PTA, pt recently here for septic knee and torn meniscus during transition to car, thus readmitted. While admitted, pt transferring with up to min A. Prior to eval, confirmed WBAT with nursing and DO. Upon eval, pt performing UB ADL with mod I and LB ADL with up to total A +2. Pt able to perform bed mobility with light assist this session.  Performing STS transfers 4x with min-mod A +2. Pt with difficulty fully extending R knee and unable to tolerate bringing LLE under him, however, able to move LLE side to side and forward as well as point RLE toes L and R. Pt deferring squat pivot to chair this session. Will continue to follow. Patient will benefit from continued inpatient follow up therapy, <3 hours/day     If plan is discharge home, recommend the following: Two people to help with walking and/or transfers;Two people to help with bathing/dressing/bathroom;Assist for transportation;Help with stairs or ramp for entrance;Assistance with cooking/housework    Functional Status Assessment  Patient has had a recent decline in their functional status and demonstrates the ability to make significant improvements in function in a reasonable and predictable amount of time.  Equipment Recommendations  Other (comment) (defer)    Recommendations for Other Services       Precautions / Restrictions Precautions Precautions: Fall Restrictions Weight Bearing Restrictions: Yes LLE Weight Bearing: Weight bearing as tolerated Other  Position/Activity Restrictions: confirmed with MD WBAT      Mobility Bed Mobility Overal bed mobility: Needs Assistance Bed Mobility: Supine to Sit, Sit to Supine     Supine to sit: Min assist, HOB elevated Sit to supine: Min assist   General bed mobility comments: increased time and effort. Assist for LLE OOB with therapist pushing down on mattress to reduce friction. Light assist to reposition LLE in bed. Education regarding hooking LLE with RLE to optimize ability to bring feet into bed and pt performing with contact guard.    Transfers Overall transfer level: Needs assistance Equipment used: Rolling walker (2 wheels) Transfers: Sit to/from Stand Sit to Stand: From elevated surface, Mod assist, +2 physical assistance           General transfer comment: increased forward flexion, time and effort, ultimately mod A for facilitation of anterior weight shift and rise.      Balance Overall balance assessment: Needs assistance Sitting-balance support: No upper extremity supported, Feet supported Sitting balance-Leahy Scale: Good     Standing balance support: Bilateral upper extremity supported Standing balance-Leahy Scale: Poor Standing balance comment: reliant on UE support on RW due to L knee pain and RLE weakness                           ADL either performed or assessed with clinical judgement   ADL Overall ADL's : Needs assistance/impaired Eating/Feeding: Independent;Sitting   Grooming: Set up;Supervision/safety;Sitting   Upper Body Bathing: Modified independent;Sitting   Lower Body Bathing: Maximal assistance;Sit to/from stand   Upper Body Dressing : Modified independent;Sitting  Lower Body Dressing: Total assistance;+2 for physical assistance     Toilet Transfer Details (indicate cue type and reason): mod A +2 for STS Toileting- Clothing Manipulation and Hygiene: Total assistance;+2 for safety/equipment;Sit to/from stand         General ADL  Comments: Unable to progress gait this session due to inability to straighten RLE while offloading weight on LLE     Vision Ability to See in Adequate Light: 0 Adequate Patient Visual Report: No change from baseline Vision Assessment?: No apparent visual deficits     Perception         Praxis         Pertinent Vitals/Pain Pain Assessment Pain Assessment: Faces Faces Pain Scale: Hurts even more Pain Location: L knee Pain Descriptors / Indicators: Aching Pain Intervention(s): Limited activity within patient's tolerance, Monitored during session     Extremity/Trunk Assessment Upper Extremity Assessment Upper Extremity Assessment: Generalized weakness (Overall WFL for basic tasks, but difficulty using BUE to offload weight from LE)   Lower Extremity Assessment Lower Extremity Assessment: Defer to PT evaluation   Cervical / Trunk Assessment Cervical / Trunk Assessment: Normal   Communication Communication Communication: No apparent difficulties Cueing Techniques: Verbal cues   Cognition Arousal: Alert Behavior During Therapy: WFL for tasks assessed/performed Overall Cognitive Status: Within Functional Limits for tasks assessed                                 General Comments: very pleasant and conversational     General Comments  VSS. Bowel incontinent.    Exercises     Shoulder Instructions      Home Living Family/patient expects to be discharged to:: Private residence Living Arrangements: Spouse/significant other Available Help at Discharge: Family;Available 24 hours/day Type of Home: House Home Access: Stairs to enter [employer] of Steps: 4 Entrance Stairs-Rails: Right Home Layout: Two level;Able to live on main level with bedroom/bathroom     Bathroom Shower/Tub: Producer, television/film/video: Standard Bathroom Accessibility: Yes How Accessible: Accessible via walker Home Equipment: Rolling Walker (2 wheels)           Prior Functioning/Environment Prior Level of Function : Independent/Modified Independent;Driving             Mobility Comments: uses RW, 2 weeks ago was not using anything ADLs Comments: "very independent"        OT Problem List: Impaired balance (sitting and/or standing);Decreased activity tolerance      OT Treatment/Interventions: Self-care/ADL training;Balance training;Therapeutic exercise;Therapeutic activities;Patient/family education    OT Goals(Current goals can be found in the care plan section) Acute Rehab OT Goals Patient Stated Goal: go home OT Goal Formulation: With patient/family Time For Goal Achievement: 04/06/23 Potential to Achieve Goals: Good  OT Frequency: Min 2X/week    Co-evaluation PT/OT/SLP Co-Evaluation/Treatment: Yes Reason for Co-Treatment: For patient/therapist safety;To address functional/ADL transfers PT goals addressed during session: Mobility/safety with mobility;Proper use of DME OT goals addressed during session: Strengthening/ROM;Proper use of Adaptive equipment and DME      AM-PAC OT "6 Clicks" Daily Activity     Outcome Measure Help from another person eating meals?: None Help from another person taking care of personal grooming?: A Little Help from another person toileting, which includes using toliet, bedpan, or urinal?: A Little Help from another person bathing (including washing, rinsing, drying)?: A Lot Help from another person to put on and taking off regular upper body clothing?:  None Help from another person to put on and taking off regular lower body clothing?: A Lot 6 Click Score: 18   End of Session Equipment Utilized During Treatment: Gait belt;Rolling walker (2 wheels) Nurse Communication: Mobility status (confirming weightbearing precautions and RN reaching out to MD/PA prior to session)  Activity Tolerance: Patient tolerated treatment well Patient left: in bed;with call bell/phone within reach;with bed alarm set  OT  Visit Diagnosis: Unsteadiness on feet (R26.81);Other abnormalities of gait and mobility (R26.89);Pain Pain - Right/Left: Left Pain - part of body: Knee                Time: 1123-1200 OT Time Calculation (min): 37 min Charges:  OT General Charges $OT Visit: 1 Visit OT Evaluation $OT Eval Moderate Complexity: 1 Mod  Tyler Deis, OTR/L [facility] Acute Rehabilitation Office: [phone]   Myrla Halsted 03/23/2023, 1:26 PM

## 2023-03-23 NOTE — Plan of Care (Signed)

## 2023-03-23 NOTE — Progress Notes (Signed)
Physical Therapy Treatment Patient Details Name: Dean Mayer MRN: [id number] DOB: 12-16-1940 Today's Date: 03/23/2023   History of Present Illness 82 y.o. male presents to [facility] hospital on 03/20/2023. Pt was discharging on same date and twisted L knee when attempting to enter car, with significant increase in pain. L knee was recently treated for sepsis. Pt found to have lateral meniscus tear on MRI. PMH includes HTN, throat and prostate CA.    PT Comments  Pt with fair tolerance to treatment today. Co-treat with OT. Pt able to stand EOB multiple times with +2 Min/Mod A however was limited due to bowel incontinence and R knee buckling. No change in DC/DME recs at this time. PT will continue to follow.    If plan is discharge home, recommend the following: A lot of help with walking and/or transfers;A lot of help with bathing/dressing/bathroom;Assistance with cooking/housework;Assist for transportation;Help with stairs or ramp for entrance   Can travel by private vehicle     No  Equipment Recommendations  Wheelchair (measurements PT)    Recommendations for Other Services       Precautions / Restrictions Precautions Precautions: Fall Restrictions Weight Bearing Restrictions: Yes LLE Weight Bearing: Weight bearing as tolerated Other Position/Activity Restrictions: confirmed with MD WBAT     Mobility  Bed Mobility Overal bed mobility: Needs Assistance Bed Mobility: Supine to Sit, Sit to Supine     Supine to sit: Min assist, HOB elevated Sit to supine: Min assist   General bed mobility comments: increased time and effort. Assist for LLE OOB with therapist pushing down on mattress to reduce friction. Light assist to reposition LLE in bed. Education regarding hooking LLE with RLE to optimize ability to bring feet into bed and pt performing with contact guard.    Transfers Overall transfer level: Needs assistance Equipment used: Rolling walker (2 wheels) Transfers: Sit to/from  Stand Sit to Stand: From elevated surface, Mod assist, +2 physical assistance           General transfer comment: increased forward flexion, time and effort, ultimately mod A for facilitation of anterior weight shift and rise.    Ambulation/Gait               General Gait Details: Unable due to knee buckling   Stairs             Wheelchair Mobility     Tilt Bed    Modified Rankin (Stroke Patients Only)       Balance Overall balance assessment: Needs assistance Sitting-balance support: No upper extremity supported, Feet supported Sitting balance-Leahy Scale: Good     Standing balance support: Bilateral upper extremity supported Standing balance-Leahy Scale: Poor Standing balance comment: reliant on UE support on RW due to L knee pain and RLE weakness                            Cognition Arousal: Alert Behavior During Therapy: WFL for tasks assessed/performed Overall Cognitive Status: Within Functional Limits for tasks assessed                                 General Comments: very pleasant and conversational        Exercises      General Comments General comments (skin integrity, edema, etc.): VSS. Bowel incontinent.      Pertinent Vitals/Pain Pain Assessment Pain Assessment: Faces Faces Pain Scale:  Hurts even more Pain Location: L knee Pain Descriptors / Indicators: Aching    Home Living Family/patient expects to be discharged to:: Private residence Living Arrangements: Spouse/significant other Available Help at Discharge: Family;Available 24 hours/day Type of Home: House Home Access: Stairs to enter Entrance Stairs-Rails: Right Entrance Stairs-Number of Steps: 4   Home Layout: Two level;Able to live on main level with bedroom/bathroom Home Equipment: Rolling Walker (2 wheels)      Prior Function            PT Goals (current goals can now be found in the care plan section) Progress towards PT goals:  Progressing toward goals    Frequency    Min 1X/week      PT Plan      Co-evaluation PT/OT/SLP Co-Evaluation/Treatment: Yes Reason for Co-Treatment: For patient/therapist safety;To address functional/ADL transfers PT goals addressed during session: Mobility/safety with mobility;Proper use of DME OT goals addressed during session: Strengthening/ROM;Proper use of Adaptive equipment and DME      AM-PAC PT "6 Clicks" Mobility   Outcome Measure  Help needed turning from your back to your side while in a flat bed without using bedrails?: A Little Help needed moving from lying on your back to sitting on the side of a flat bed without using bedrails?: A Little Help needed moving to and from a bed to a chair (including a wheelchair)?: Total Help needed standing up from a chair using your arms (e.g., wheelchair or bedside chair)?: Total Help needed to walk in hospital room?: Total Help needed climbing 3-5 steps with a railing? : Total 6 Click Score: 10    End of Session Equipment Utilized During Treatment: Gait belt Activity Tolerance: Patient limited by pain;Patient limited by fatigue;Other (comment) (Bowel incontinence) Patient left: in bed;with call bell/phone within reach;with bed alarm set Nurse Communication: Mobility status PT Visit Diagnosis: Other abnormalities of gait and mobility (R26.89);Muscle weakness (generalized) (M62.81);Pain Pain - Right/Left: Left Pain - part of body: Knee     Time: 1137-1211 PT Time Calculation (min) (ACUTE ONLY): 34 min  Charges:    $Therapeutic Activity: 8-22 mins PT General Charges $$ ACUTE PT VISIT: 1 Visit                     Dean Mayer, PT, DPT Acute Rehab Services [phone]    Dean Mayer 03/23/2023, 4:37 PM

## 2023-03-23 NOTE — Progress Notes (Signed)
Inpatient Rehab Admissions Coordinator:   Per therapy recommendations, patient was screened for CIR candidacy by Megan Salon, MS, CCC-SLP. At this time, Pt. does not appear to demonstrate medical necessity to justify in hospital rehabilitation/CIR. Additionally, Pt.'s insurance is unlikely to approve CIR for Pt's diagnosis. I will not pursue a rehab consult for this Pt.   Recommend other rehab venues to be pursued.  Please contact me with any questions.  Megan Salon, MS, CCC-SLP Rehab Admissions Coordinator  [phone] (celll) [phone] (office)

## 2023-03-23 NOTE — Progress Notes (Signed)
[facility]  PROGRESS NOTE  Dean Mayer WUJ:[id number] DOB: 1941/05/23 DOA: 03/20/2023 PCP: Angelique Blonder, MD   Brief HPI:   82 y.o. male with medical history significant of  82yom w/ history of gout, HTN, HLD, BPH, hypothyroidism, throat cancer, positive ANA, positive rheumatoid factor who on 7/29 saw rheumatologist with complaints of left knee pain and swelling.  Fluid analysis at that time was consistent with gout.  He was treated with intra-articular steroid injection. initial tap 7/29 cultures grew MSS that was pan-sensitive. Patient discussed with orthopedics who recommended admission for diagnosis of MSSA left knee septic arthritis. During hospitalization patient underwent I/D of left knee. Patient there after was prescribed 6 week course of IV antibiotics with out patient f/u with orthopedics. Patient on discharged today, in attempt to transfer from wheel chair to his car pivoted and at that point has acute intense pain. Due to this pain he was unable to bear weight and as then sent back to ED for evaluation.  MRI of left knee showed horizontal tear of the anterior horn and body of the lateral meniscus extending to the free edge.   Assessment/Plan:   Septic arthritis, POA, complicated by lateral meniscus tear -Currently on IV Ancef for 6 weeks -Consulted Dr. Jena Gauss; no plan for treatment for meniscus tear -Weightbearing as tolerated, confirmed with Dr. Jena Gauss -Will likely need rehab  Gout -Continue allopurinol -S/p treatment for flareup with colchicine  Hyperlipidemia -Continue statin  Normocytic anemia -Hemoglobin is stable  Hypothyroidism -Continue Synthroid  BPH -Continue tamsulosin  Hypertension -Continue amlodipine, lisinopril -Blood pressure well-controlled    Medications     allopurinol  100 mg Oral Daily   amLODipine  10 mg Oral Daily   ascorbic acid  1,000 mg Oral Daily   aspirin EC  325 mg Oral Daily   atorvastatin  40 mg Oral QPM    Chlorhexidine Gluconate Cloth  6 each Topical Daily   docusate sodium  100 mg Oral BID   heparin  5,000 Units Subcutaneous Q8H   levothyroxine  100 mcg Oral Q0600   lisinopril  5 mg Oral BID   sodium chloride flush  10-40 mL Intracatheter Q12H   tamsulosin  0.4 mg Oral Daily     Data Reviewed:   CBG:  Recent Labs  Lab 03/22/23 0809 03/22/23 1157 03/22/23 1658 03/22/23 1929 03/23/23 0722  GLUCAP 121* 195* 130* 127* 121*    SpO2: 97 %    Vitals:   03/22/23 1928 03/23/23 0406 03/23/23 0500 03/23/23 0724  BP: (!) 173/51 (!) 151/78  (!) 141/67  Pulse: 95 78  73  Resp: 18 18  17   Temp: 98.4 F (36.9 C) 98.2 F (36.8 C)  98.3 F (36.8 C)  TempSrc: Oral Oral  Oral  SpO2: 97% 100%  97%  Weight:   105.8 kg       Data Reviewed:  Basic Metabolic Panel: Recent Labs  Lab 03/19/23 0303 03/20/23 0607 03/21/23 0656 03/21/23 1511 03/22/23 0455 03/23/23 0123  NA 133* 135 134*  --  135 134*  K 3.9 3.6 3.7  --  3.9 3.8  CL 100 102 102  --  102 99  CO2 25 24 25   --  25 25  GLUCOSE 120* 102* 124*  --  111* 140*  BUN 12 9 14   --  8 9  CREATININE 0.81 0.69 0.75 0.74 0.64 0.59*  CALCIUM 8.1* 8.4* 8.4*  --  8.5* 8.3*    CBC: Recent Labs  Lab 03/20/23 0607 03/21/23 0656 03/21/23 1511 03/22/23 0455 03/23/23 0123  WBC 6.5 6.0 6.2 6.0 8.6  NEUTROABS  --  4.7  --   --   --   HGB 12.3* 10.5* 11.9* 10.9* 10.8*  HCT 38.3* 32.1* 36.3* 33.2* 32.5*  MCV 88.2 88.9 88.5 86.9 87.8  PLT 291 302 288 295 304    LFT Recent Labs  Lab 03/18/23 0014 03/19/23 0303 03/20/23 0607 03/22/23 0455 03/23/23 0123  AST 23 23 30 21 22   ALT 14 11 13 9 8   ALKPHOS 96 97 111 110 92  BILITOT 0.3 0.4 0.3 0.7 0.4  PROT 5.2* 5.2* 5.9* 5.7* 5.3*  ALBUMIN 1.7* 1.7* 1.9* 1.8* 1.7*     Antibiotics: Anti-infectives (From admission, onward)    Start     Dose/Rate Route Frequency Ordered Stop   03/21/23 1545  ceFAZolin (ANCEF) IVPB  Status:  Discontinued       Note to Pharmacy: Indication:   MSSA septic knee First Dose: Yes Last Day of Therapy:  04/28/23 Labs - Once weekly:  CBC/D and BMP, ESR and CRP Method of administration: IV Push Pull PICC line at the completion of IV antibiotic therapy Method of administration may be changed at the discretion of home infusion pha   2 g Intravenous Every 8 hours 03/21/23 1445 03/21/23 1510   03/21/23 0230  ceFAZolin (ANCEF) IVPB 2g/100 mL premix        2 g 200 mL/hr over 30 Minutes Intravenous Every 8 hours 03/21/23 0220          DVT prophylaxis: SCDS  Code Status: Full code  Family Communication: Discussed with patient wife at bedside   CONSULTS    Subjective   Denies pain.   Objective    Physical Examination:   General-appears in no acute distress Heart-S1-S2, regular, no murmur auscultated Lungs-clear to auscultation bilaterally, no wheezing or crackles auscultated Abdomen-soft, nontender, no organomegaly Extremities-left knee in dressing Neuro-alert, oriented x3, no focal deficit noted  Status is: Inpatient:             Meredeth Ide   [facility] If 7PM-7AM, please contact night-coverage at [email or web address], Office  [phone]   03/23/2023, 9:36 AM  LOS: 2 days

## 2023-03-24 DIAGNOSIS — M25562 Pain in left knee: Secondary | ICD-10-CM | POA: Diagnosis not present

## 2023-03-24 DIAGNOSIS — M009 Pyogenic arthritis, unspecified: Secondary | ICD-10-CM | POA: Diagnosis not present

## 2023-03-24 LAB — GLUCOSE, CAPILLARY
Glucose-Capillary: 107 mg/dL — ABNORMAL HIGH (ref 70–99)
Glucose-Capillary: 142 mg/dL — ABNORMAL HIGH (ref 70–99)
Glucose-Capillary: 111 mg/dL — ABNORMAL HIGH (ref 70–99)
Glucose-Capillary: 113 mg/dL — ABNORMAL HIGH (ref 70–99)

## 2023-03-24 NOTE — Progress Notes (Signed)
[facility]  PROGRESS NOTE  Dean Mayer MVH:[id number] DOB: [birth year] DOA: 03/20/2023 PCP: Angelique Blonder, MD   Brief HPI:   82 y.o. male with medical history significant of  82yom w/ history of gout, HTN, HLD, BPH, hypothyroidism, throat cancer, positive ANA, positive rheumatoid factor who on 7/29 saw rheumatologist with complaints of left knee pain and swelling.  Fluid analysis at that time was consistent with gout.  He was treated with intra-articular steroid injection. initial tap 7/29 cultures grew MSS that was pan-sensitive. Patient discussed with orthopedics who recommended admission for diagnosis of MSSA left knee septic arthritis. During hospitalization patient underwent I/D of left knee. Patient there after was prescribed 6 week course of IV antibiotics with out patient f/u with orthopedics. Patient on discharged today, in attempt to transfer from wheel chair to his car pivoted and at that point has acute intense pain. Due to this pain he was unable to bear weight and as then sent back to ED for evaluation.  MRI of left knee showed horizontal tear of the anterior horn and body of the lateral meniscus extending to the free edge.   Assessment/Plan:   Septic arthritis, POA, complicated by lateral meniscus tear -Currently on IV Ancef for 6 weeks -Consulted Dr. Jena Gauss; no plan for treatment for meniscus tear -Weightbearing as tolerated, confirmed with Dr. Jena Gauss -Will need to go to skilled nursing facility for rehab  Gout -Continue allopurinol -S/p treatment for flareup with colchicine  Hyperlipidemia -Continue statin  Normocytic anemia -Hemoglobin is stable  Hypothyroidism -Continue Synthroid  BPH -Continue tamsulosin  Hypertension -Continue amlodipine, lisinopril -Blood pressure well-controlled    Medications     allopurinol  100 mg Oral Daily   amLODipine  10 mg Oral Daily   ascorbic acid  1,000 mg Oral Daily   aspirin EC  325 mg Oral Daily    atorvastatin  40 mg Oral QPM   Chlorhexidine Gluconate Cloth  6 each Topical Daily   docusate sodium  100 mg Oral BID   heparin  5,000 Units Subcutaneous Q8H   levothyroxine  100 mcg Oral Q0600   lisinopril  5 mg Oral BID   sodium chloride flush  10-40 mL Intracatheter Q12H   tamsulosin  0.4 mg Oral Daily     Data Reviewed:   CBG:  Recent Labs  Lab 03/23/23 1120 03/23/23 1609 03/23/23 2053 03/24/23 0735 03/24/23 1102  GLUCAP 162* 116* 123* 107* 142*    SpO2: 95 % O2 Flow Rate (L/min): 0 L/min    Vitals:   03/23/23 1353 03/23/23 2056 03/24/23 0540 03/24/23 0716  BP: 106/67 (!) 125/57 (!) 142/60 (!) 151/69  Pulse: 79 78 74 77  Resp: 17 16  17   Temp: 98.2 F (36.8 C) 98.3 F (36.8 C) 98.2 F (36.8 C) 98.3 F (36.8 C)  TempSrc: Oral Oral Oral Oral  SpO2: 99% 95% 96% 95%  Weight:   106.9 kg       Data Reviewed:  Basic Metabolic Panel: Recent Labs  Lab 03/19/23 0303 03/20/23 0607 03/21/23 0656 03/21/23 1511 03/22/23 0455 03/23/23 0123  NA 133* 135 134*  --  135 134*  K 3.9 3.6 3.7  --  3.9 3.8  CL 100 102 102  --  102 99  CO2 25 24 25   --  25 25  GLUCOSE 120* 102* 124*  --  111* 140*  BUN 12 9 14   --  8 9  CREATININE 0.81 0.69 0.75 0.74 0.64 0.59*  CALCIUM  8.1* 8.4* 8.4*  --  8.5* 8.3*    CBC: Recent Labs  Lab 03/20/23 0607 03/21/23 0656 03/21/23 1511 03/22/23 0455 03/23/23 0123  WBC 6.5 6.0 6.2 6.0 8.6  NEUTROABS  --  4.7  --   --   --   HGB 12.3* 10.5* 11.9* 10.9* 10.8*  HCT 38.3* 32.1* 36.3* 33.2* 32.5*  MCV 88.2 88.9 88.5 86.9 87.8  PLT 291 302 288 295 304    LFT Recent Labs  Lab 03/18/23 0014 03/19/23 0303 03/20/23 0607 03/22/23 0455 03/23/23 0123  AST 23 23 30 21 22   ALT 14 11 13 9 8   ALKPHOS 96 97 111 110 92  BILITOT 0.3 0.4 0.3 0.7 0.4  PROT 5.2* 5.2* 5.9* 5.7* 5.3*  ALBUMIN 1.7* 1.7* 1.9* 1.8* 1.7*     Antibiotics: Anti-infectives (From admission, onward)    Start     Dose/Rate Route Frequency Ordered Stop    03/21/23 1545  ceFAZolin (ANCEF) IVPB  Status:  Discontinued       Note to Pharmacy: Indication:  MSSA septic knee First Dose: Yes Last Day of Therapy:  04/28/23 Labs - Once weekly:  CBC/D and BMP, ESR and CRP Method of administration: IV Push Pull PICC line at the completion of IV antibiotic therapy Method of administration may be changed at the discretion of home infusion pha   2 g Intravenous Every 8 hours 03/21/23 1445 03/21/23 1510   03/21/23 0230  ceFAZolin (ANCEF) IVPB 2g/100 mL premix        2 g 200 mL/hr over 30 Minutes Intravenous Every 8 hours 03/21/23 0220          DVT prophylaxis: SCDS  Code Status: Full code  Family Communication: Discussed with patient wife at bedside   CONSULTS    Subjective   Denies any complaints.  Objective    Physical Examination:   General-appears in no acute distress Heart-S1-S2, regular, no murmur auscultated Lungs-clear to auscultation bilaterally, no wheezing or crackles auscultated Abdomen-soft, nontender, no organomegaly Extremities-left knee in dressing Neuro-alert, oriented x3, no focal deficit noted  Status is: Inpatient:             Meredeth Ide   [facility] If 7PM-7AM, please contact night-coverage at [email or web address], Office  [phone]   03/24/2023, 1:38 PM  LOS: 3 days

## 2023-03-25 ENCOUNTER — Ambulatory Visit: Payer: Medicare HMO | Admitting: Internal Medicine

## 2023-03-25 DIAGNOSIS — M25562 Pain in left knee: Secondary | ICD-10-CM | POA: Diagnosis not present

## 2023-03-25 DIAGNOSIS — M009 Pyogenic arthritis, unspecified: Secondary | ICD-10-CM | POA: Diagnosis not present

## 2023-03-25 LAB — RETICULOCYTES
Immature Retic Fract: 14.9 % (ref 2.3–15.9)
RBC.: 4.16 MIL/uL — ABNORMAL LOW (ref 4.22–5.81)
Retic Count, Absolute: 42.8 10*3/uL (ref 19.0–186.0)
Retic Ct Pct: 1 % (ref 0.4–3.1)

## 2023-03-25 LAB — IRON AND TIBC
Iron: 46 ug/dL (ref 45–182)
Saturation Ratios: 29 % (ref 17.9–39.5)
TIBC: 157 ug/dL — ABNORMAL LOW (ref 250–450)
UIBC: 111 ug/dL

## 2023-03-25 LAB — GLUCOSE, CAPILLARY
Glucose-Capillary: 105 mg/dL — ABNORMAL HIGH (ref 70–99)
Glucose-Capillary: 135 mg/dL — ABNORMAL HIGH (ref 70–99)

## 2023-03-25 LAB — FERRITIN: Ferritin: 315 ng/mL (ref 24–336)

## 2023-03-25 LAB — VITAMIN B12: Vitamin B-12: 974 pg/mL — ABNORMAL HIGH (ref 180–914)

## 2023-03-25 LAB — FOLATE: Folate: 21.9 ng/mL (ref >5.9)

## 2023-03-25 MED ORDER — OXYCODONE-ACETAMINOPHEN 5-325 MG PO TABS
1.0000 | ORAL_TABLET | ORAL | Status: DC | PRN
  Administered 2023-03-25 – 2023-03-26 (×4): 2 via ORAL
  Filled 2023-03-25 (×4): qty 2

## 2023-03-25 NOTE — Progress Notes (Signed)
[facility]  PROGRESS NOTE  Dean Mayer FAO:[id number] DOB: 19-Nov-1940 DOA: 03/20/2023 PCP: Angelique Blonder, MD   Brief HPI:   82 y.o. male with medical history significant of  82yom w/ history of gout, HTN, HLD, BPH, hypothyroidism, throat cancer, positive ANA, positive rheumatoid factor who on 7/29 saw rheumatologist with complaints of left knee pain and swelling.  Fluid analysis at that time was consistent with gout.  He was treated with intra-articular steroid injection. initial tap 7/29 cultures grew MSS that was pan-sensitive. Patient discussed with orthopedics who recommended admission for diagnosis of MSSA left knee septic arthritis. During hospitalization patient underwent I/D of left knee. Patient there after was prescribed 6 week course of IV antibiotics with out patient f/u with orthopedics. Patient on discharged today, in attempt to transfer from wheel chair to his car pivoted and at that point has acute intense pain. Due to this pain he was unable to bear weight and as then sent back to ED for evaluation.  MRI of left knee showed horizontal tear of the anterior horn and body of the lateral meniscus extending to the free edge.   Assessment/Plan:   Septic arthritis, POA, complicated by lateral meniscus tear -Currently on IV Ancef for 6 weeks -Consulted Dr. Jena Gauss; no plan for treatment for meniscus tear -Weightbearing as tolerated, confirmed with Dr. Jena Gauss -Will need to go to skilled nursing facility for rehab  Gout -Continue allopurinol -S/p treatment for flareup with colchicine  Hyperlipidemia -Continue statin  Normocytic anemia -Hemoglobin is stable at 10.9 -Will check anemia panel  Hypothyroidism -Continue Synthroid  BPH -Continue tamsulosin  Hypertension -Continue amlodipine, lisinopril -Blood pressure well-controlled    Medications     allopurinol  100 mg Oral Daily   amLODipine  10 mg Oral Daily   ascorbic acid  1,000 mg Oral Daily    aspirin EC  325 mg Oral Daily   atorvastatin  40 mg Oral QPM   Chlorhexidine Gluconate Cloth  6 each Topical Daily   docusate sodium  100 mg Oral BID   heparin  5,000 Units Subcutaneous Q8H   levothyroxine  100 mcg Oral Q0600   lisinopril  5 mg Oral BID   sodium chloride flush  10-40 mL Intracatheter Q12H   tamsulosin  0.4 mg Oral Daily     Data Reviewed:   CBG:  Recent Labs  Lab 03/24/23 0735 03/24/23 1102 03/24/23 1559 03/24/23 2008 03/25/23 0711  GLUCAP 107* 142* 111* 113* 105*    SpO2: 97 % O2 Flow Rate (L/min): 0 L/min    Vitals:   03/24/23 1600 03/24/23 2007 03/25/23 0342 03/25/23 0712  BP: 133/65 128/61 (!) 179/73 (!) 146/71  Pulse: 72 74 79 75  Resp: 18 18 18 16   Temp: 98.6 F (37 C) 98.1 F (36.7 C) 98.2 F (36.8 C) 97.9 F (36.6 C)  TempSrc: Oral Oral Oral Oral  SpO2: 96% 95% 97% 97%  Weight:          Data Reviewed:  Basic Metabolic Panel: Recent Labs  Lab 03/19/23 0303 03/20/23 0607 03/21/23 0656 03/21/23 1511 03/22/23 0455 03/23/23 0123  NA 133* 135 134*  --  135 134*  K 3.9 3.6 3.7  --  3.9 3.8  CL 100 102 102  --  102 99  CO2 25 24 25   --  25 25  GLUCOSE 120* 102* 124*  --  111* 140*  BUN 12 9 14   --  8 9  CREATININE 0.81 0.69 0.75 0.74  0.64 0.59*  CALCIUM 8.1* 8.4* 8.4*  --  8.5* 8.3*    CBC: Recent Labs  Lab 03/20/23 0607 03/21/23 0656 03/21/23 1511 03/22/23 0455 03/23/23 0123  WBC 6.5 6.0 6.2 6.0 8.6  NEUTROABS  --  4.7  --   --   --   HGB 12.3* 10.5* 11.9* 10.9* 10.8*  HCT 38.3* 32.1* 36.3* 33.2* 32.5*  MCV 88.2 88.9 88.5 86.9 87.8  PLT 291 302 288 295 304    LFT Recent Labs  Lab 03/19/23 0303 03/20/23 0607 03/22/23 0455 03/23/23 0123  AST 23 30 21 22   ALT 11 13 9 8   ALKPHOS 97 111 110 92  BILITOT 0.4 0.3 0.7 0.4  PROT 5.2* 5.9* 5.7* 5.3*  ALBUMIN 1.7* 1.9* 1.8* 1.7*     Antibiotics: Anti-infectives (From admission, onward)    Start     Dose/Rate Route Frequency Ordered Stop   03/21/23 1545   ceFAZolin (ANCEF) IVPB  Status:  Discontinued       Note to Pharmacy: Indication:  MSSA septic knee First Dose: Yes Last Day of Therapy:  04/28/23 Labs - Once weekly:  CBC/D and BMP, ESR and CRP Method of administration: IV Push Pull PICC line at the completion of IV antibiotic therapy Method of administration may be changed at the discretion of home infusion pha   2 g Intravenous Every 8 hours 03/21/23 1445 03/21/23 1510   03/21/23 0230  ceFAZolin (ANCEF) IVPB 2g/100 mL premix        2 g 200 mL/hr over 30 Minutes Intravenous Every 8 hours 03/21/23 0220          DVT prophylaxis: SCDS  Code Status: Full code  Family Communication: Discussed with patient wife on phone   CONSULTS    Subjective   Denies any complaints  Objective    Physical Examination:  General-appears in no acute distress Heart-S1-S2, regular, no murmur auscultated Lungs-clear to auscultation bilaterally, no wheezing or crackles auscultated Abdomen-soft, nontender, no organomegaly Extremities-left knee in dressing Neuro-alert, oriented x3, no focal deficit noted   Status is: Inpatient:             Meredeth Ide   [facility] If 7PM-7AM, please contact night-coverage at [email or web address], Office  [phone]   03/25/2023, 8:39 AM  LOS: 4 days

## 2023-03-25 NOTE — TOC Progression Note (Addendum)
Transition of Care [facility]) - Progression Note    Patient Details  Name: Dean Mayer MRN: [id number] Date of Birth: Jun 08, 1941  Transition of Care [id number]) CM/SW Contact  Lorri Frederick, LCSW Phone Number: 03/25/2023, 1:18 PM  Clinical Narrative:   Bed offers provided to pt and wife Boneta Lucks on [place] choice document.  They would like to accept offer at [facility].    Waiting on confirmation from [place].    [facility]: [facility] can receive pt after auth.  Marylene Land Martin/TOC will start [place].      Expected Discharge Plan: Skilled Nursing Facility Barriers to Discharge: Continued Medical Work up, SNF Pending bed offer  Expected Discharge Plan and Services In-house Referral: Clinical Social Work   Post Acute Care Choice: Skilled Nursing Facility Living arrangements for the past 2 months: Single Family Home                                       Social Determinants of Health (SDOH) Interventions SDOH Screenings   Food Insecurity: No Food Insecurity (12/01/2022)  Housing: Low Risk  (12/01/2022)  Transportation Needs: No Transportation Needs (12/01/2022)  Utilities: Not At Risk (12/01/2022)  Financial Resource Strain: Low Risk  (11/19/2021)   Received from [facility], [facility]  Physical Activity: Inactive (11/19/2021)   Received from [facility], [facility]  Social Connections: Unknown (11/26/2022)   Received from [facility], [facility]  Stress: No Stress Concern Present (03/16/2022)   Received from [facility], [facility]  Tobacco Use: Medium Risk (03/20/2023)    Readmission Risk Interventions     No data to display

## 2023-03-25 NOTE — Plan of Care (Signed)
  Problem: Activity: Goal: Risk for activity intolerance will decrease Outcome: Progressing   Problem: Pain Managment: Goal: General experience of comfort will improve Outcome: Progressing   Problem: Safety: Goal: Ability to remain free from injury will improve Outcome: Progressing   

## 2023-03-25 NOTE — Care Management Important Message (Signed)
Important Message  Patient Details  Name: ETHELBERT ONDERKO MRN: [id number] Date of Birth: 24-Mar-1941   Medicare Important Message Given:  Yes     Sherilyn Banker 03/25/2023, 3:04 PM

## 2023-03-25 NOTE — Progress Notes (Signed)
Orthopaedic Trauma Progress Note  SUBJECTIVE: Patient doing okay today.  Patient readmitted on 03/21/2023 following a twisting injury when getting into the car at discharge.  MRI of the left knee showed anterior horn meniscus tear which appears to be degenerative.  Has been working with therapies but notes he did not work with therapies over the weekend.  He has been doing some range of motion exercises on his own in bed.  No chest pain. No SOB. No nausea/vomiting. No other complaints.  Asking for a plan moving forward  OBJECTIVE:  Vitals:   03/25/23 0342 03/25/23 0712  BP: (!) 179/73 (!) 146/71  Pulse: 79 75  Resp: 18 16  Temp: 98.2 F (36.8 C) 97.9 F (36.6 C)  SpO2: 97% 97%    General: Sitting up in bed, no acute distress Respiratory: No increased work of breathing.  Left lower extremity: Ace wrap and Mepilex dressing removed.  Incision healing well.  No drainage.  No surrounding redness.  Moderate swelling to the knee.  Tolerates gentle knee range of motion.  Ankle DF/PF intact.  No calf tenderness.  Compartment soft compressible. + DP pulse  IMAGING: Stable post op imaging.   LABS:  Results for orders placed or performed during the hospital encounter of 03/20/23 (from the past 24 hour(s))  Glucose, capillary     Status: Abnormal   Collection Time: 03/24/23 11:02 AM  Result Value Ref Range   Glucose-Capillary 142 (H) 70 - 99 mg/dL  Glucose, capillary     Status: Abnormal   Collection Time: 03/24/23  3:59 PM  Result Value Ref Range   Glucose-Capillary 111 (H) 70 - 99 mg/dL  Glucose, capillary     Status: Abnormal   Collection Time: 03/24/23  8:08 PM  Result Value Ref Range   Glucose-Capillary 113 (H) 70 - 99 mg/dL  Glucose, capillary     Status: Abnormal   Collection Time: 03/25/23  7:11 AM  Result Value Ref Range   Glucose-Capillary 105 (H) 70 - 99 mg/dL    ASSESSMENT: Dean Mayer is a 82 y.o. male s/p INCISION AND DRAINAGE LEFT SEPTIC KNEE by Dr. Jena Gauss on  03/15/2023  CV/Blood loss: Acute blood loss anemia, Hgb 10.8 on 03/23/23. Stable. Hemodynamically stable  PLAN: Weightbearing: WBAT LLE ROM: Unrestricted ROM Incisional and dressing care: Okay to leave incision to the left knee open to air Showering: Okay to shower and get incision wet Orthopedic device(s): None  Pain management: Continue current regimen VTE prophylaxis: Aspirin, SCDs ID: Ancef per infectious disease Foley/Lines:  No foley, KVO IVFs Dispo: MRI left knee showed meniscus tear.  No surgical intervention needed at this time.  Patient may continue weightbearing as tolerated on the left leg.  He may have unrestricted range of motion.  PT/OT evaluation ongoing.  Patient will need SNF at discharge.  Okay for discharge from ortho standpoint once cleared by medicine team and therapies  D/C recommendations: - Percocet for pain control - ASA 325 mg daily x 30 days for DVT prophylaxis  Follow - up plan: 2 weeks after d/c   Contact information:  Truitt Merle MD, Thyra Breed PA-C. After hours and holidays please check [email or web address] for group call information for [facility]   Thompson Caul, PA-C [phone] (office) [email or web address]

## 2023-03-26 DIAGNOSIS — M009 Pyogenic arthritis, unspecified: Secondary | ICD-10-CM | POA: Diagnosis not present

## 2023-03-26 DIAGNOSIS — M25562 Pain in left knee: Secondary | ICD-10-CM | POA: Diagnosis not present

## 2023-03-26 LAB — GLUCOSE, CAPILLARY
Glucose-Capillary: 150 mg/dL — ABNORMAL HIGH (ref 70–99)
Glucose-Capillary: 213 mg/dL — ABNORMAL HIGH (ref 70–99)
Glucose-Capillary: 169 mg/dL — ABNORMAL HIGH (ref 70–99)

## 2023-03-26 NOTE — Progress Notes (Signed)
Physical Therapy Treatment Patient Details Name: Dean Mayer MRN: [id number] DOB: 1941/01/19 Today's Date: 03/26/2023   History of Present Illness 82 y.o. male presents to [facility] hospital on 03/20/2023. Pt was discharging on same date and twisted L knee when attempting to enter car, with significant increase in pain. L knee was recently treated for sepsis. Pt found to have lateral meniscus tear on MRI. PMH includes HTN, throat and prostate CA.    PT Comments  Pt tolerated treatment well today. Pt was able to stand and take steps to the chair today with RW +2 Mod A. No change in DC/DME recs at this time. PT will continue to follow.    If plan is discharge home, recommend the following: A lot of help with walking and/or transfers;A lot of help with bathing/dressing/bathroom;Assistance with cooking/housework;Assist for transportation;Help with stairs or ramp for entrance   Can travel by private vehicle     No  Equipment Recommendations  Wheelchair (measurements PT)    Recommendations for Other Services       Precautions / Restrictions Precautions Precautions: Fall Restrictions Weight Bearing Restrictions: Yes LLE Weight Bearing: Weight bearing as tolerated     Mobility  Bed Mobility Overal bed mobility: Needs Assistance Bed Mobility: Supine to Sit     Supine to sit: Contact guard     General bed mobility comments: increased time and effort. Assist for LLE OOB with therapist pushing down on mattress to reduce friction.    Transfers Overall transfer level: Needs assistance Equipment used: Rolling walker (2 wheels) Transfers: Sit to/from Stand, Bed to chair/wheelchair/BSC Sit to Stand: From elevated surface, +2 safety/equipment, Min assist Stand pivot transfers: Mod assist, +2 safety/equipment         General transfer comment: increased forward flexion, time and effort, ultimately mod A for facilitation of anterior weight shift and rise. (Similar to previous session  however able to take steps to chair today. Pt benefit from elevated bed due to height.)    Ambulation/Gait               General Gait Details: Deferred for safety.   Stairs             Wheelchair Mobility     Tilt Bed    Modified Rankin (Stroke Patients Only)       Balance Overall balance assessment: Needs assistance Sitting-balance support: No upper extremity supported, Feet supported Sitting balance-Leahy Scale: Good     Standing balance support: Bilateral upper extremity supported Standing balance-Leahy Scale: Poor Standing balance comment: reliant on UE support on RW due to L knee pain and RLE weakness                            Cognition Arousal: Alert Behavior During Therapy: WFL for tasks assessed/performed Overall Cognitive Status: Within Functional Limits for tasks assessed                                 General Comments: very pleasant and conversational        Exercises      General Comments General comments (skin integrity, edema, etc.): VSS      Pertinent Vitals/Pain Pain Assessment Pain Assessment: Faces Faces Pain Scale: Hurts little more Pain Location: L knee Pain Descriptors / Indicators: Aching Pain Intervention(s): Monitored during session, Limited activity within patient's tolerance, Premedicated before session, Repositioned  Home Living                          Prior Function            PT Goals (current goals can now be found in the care plan section) Progress towards PT goals: Progressing toward goals    Frequency    Min 1X/week      PT Plan      Co-evaluation              AM-PAC PT "6 Clicks" Mobility   Outcome Measure  Help needed turning from your back to your side while in a flat bed without using bedrails?: A Little Help needed moving from lying on your back to sitting on the side of a flat bed without using bedrails?: A Little Help needed moving to and  from a bed to a chair (including a wheelchair)?: A Lot Help needed standing up from a chair using your arms (e.g., wheelchair or bedside chair)?: A Lot Help needed to walk in hospital room?: A Lot Help needed climbing 3-5 steps with a railing? : Total 6 Click Score: 13    End of Session Equipment Utilized During Treatment: Gait belt Activity Tolerance: Patient tolerated treatment well Patient left: in chair;with call bell/phone within reach;with family/visitor present Nurse Communication: Mobility status PT Visit Diagnosis: Other abnormalities of gait and mobility (R26.89);Muscle weakness (generalized) (M62.81);Pain Pain - Right/Left: Left Pain - part of body: Knee     Time: 6270-3500 PT Time Calculation (min) (ACUTE ONLY): 18 min  Charges:    $Therapeutic Activity: 8-22 mins PT General Charges $$ ACUTE PT VISIT: 1 Visit                     Shela Nevin, PT, DPT Acute Rehab Services [phone]    [name] 03/26/2023, 2:50 PM

## 2023-03-26 NOTE — Discharge Instructions (Signed)
[facility] Discharge Instructions   General Discharge Instructions  WEIGHT BEARING STATUS:weightbearing as tolerated  RANGE OF MOTION/ACTIVITY: ok for knee motion as tolerated  Wound Care: Incisions can be left open to air if there is no drainage. Once the incision is completely dry and without drainage, it may be left open to air out.  OK to begin showering.  Clean incision gently with soap and water.  DVT/PE prophylaxis: Aspirin  Diet: as you were eating previously.  Can use over the counter stool softeners and bowel preparations, such as Miralax, to help with bowel movements.  Narcotics can be constipating.  Be sure to drink plenty of fluids  PAIN MEDICATION USE AND EXPECTATIONS  You have likely been given narcotic medications to help control your pain.  After a traumatic event that results in an fracture (broken bone) with or without surgery, it is ok to use narcotic pain medications to help control one's pain.  We understand that everyone responds to pain differently and each individual patient will be evaluated on a regular basis for the continued need for narcotic medications. Ideally, narcotic medication use should last no more than 6-8 weeks (coinciding with fracture healing).   As a patient it is your responsibility as well to monitor narcotic medication use and report the amount and frequency you use these medications when you come to your office visit.   We would also advise that if you are using narcotic medications, you should take a dose prior to therapy to maximize you participation.  IF YOU ARE ON NARCOTIC MEDICATIONS IT IS NOT PERMISSIBLE TO OPERATE A MOTOR VEHICLE (MOTORCYCLE/CAR/TRUCK/MOPED) OR HEAVY MACHINERY DO NOT MIX NARCOTICS WITH OTHER CNS (CENTRAL NERVOUS SYSTEM) DEPRESSANTS SUCH AS ALCOHOL   STOP SMOKING OR USING NICOTINE PRODUCTS!!!!  As discussed nicotine severely impairs your body's ability to heal surgical and traumatic wounds but also  impairs bone healing.  Wounds and bone heal by forming microscopic blood vessels (angiogenesis) and nicotine is a vasoconstrictor (essentially, shrinks blood vessels).  Therefore, if vasoconstriction occurs to these microscopic blood vessels they essentially disappear and are unable to deliver necessary nutrients to the healing tissue.  This is one modifiable factor that you can do to dramatically increase your chances of healing your injury.    (This means no smoking, no nicotine gum, patches, etc)  DO NOT USE NONSTEROIDAL ANTI-INFLAMMATORY DRUGS (NSAID'S)  Using products such as Advil (ibuprofen), Aleve (naproxen), Motrin (ibuprofen) for additional pain control during fracture healing can delay and/or prevent the healing response.  If you would like to take over the counter (OTC) medication, Tylenol (acetaminophen) is ok.  However, some narcotic medications that are given for pain control contain acetaminophen as well. Therefore, you should not exceed more than 4000 mg of tylenol in a day if you do not have liver disease.  Also note that there are may OTC medicines, such as cold medicines and allergy medicines that my contain tylenol as well.  If you have any questions about medications and/or interactions please ask your doctor/PA or your pharmacist.      ICE AND ELEVATE INJURED/OPERATIVE EXTREMITY  Using ice and elevating the injured extremity above your heart can help with swelling and pain control.  Icing in a pulsatile fashion, such as 20 minutes on and 20 minutes off, can be followed.    Do not place ice directly on skin. Make sure there is a barrier between to skin and the ice pack.    Using frozen items  such as frozen peas works well as the conform nicely to the are that needs to be iced.  USE AN ACE WRAP OR TED HOSE FOR SWELLING CONTROL  In addition to icing and elevation, Ace wraps or TED hose are used to help limit and resolve swelling.  It is recommended to use Ace wraps or TED hose until  you are informed to stop.    When using Ace Wraps start the wrapping distally (farthest away from the body) and wrap proximally (closer to the body)   Example: If you had surgery on your leg or thing and you do not have a splint on, start the ace wrap at the toes and work your way up to the thigh        If you had surgery on your upper extremity and do not have a splint on, start the ace wrap at your fingers and work your way up to the upper arm   CALL THE OFFICE WITH ANY QUESTIONS OR CONCERNS: [phone]   VISIT OUR WEBSITE FOR ADDITIONAL INFORMATION: [email or web address]    Discharge Wound Care Instructions  Do NOT apply any ointments, solutions or lotions to pin sites or surgical wounds.  These prevent needed drainage and even though solutions like hydrogen peroxide kill bacteria, they also damage cells lining the pin sites that help fight infection.  Applying lotions or ointments can keep the wounds moist and can cause them to breakdown and open up as well. This can increase the risk for infection. When in doubt call the office.  If any drainage is noted, use one layer of adaptic or Mepitel, then gauze, Kerlix, and an ace wrap. - These dressing supplies should be available at local medical supply stores [facility], [facility], etc) as well as Insurance claims handler ([facility], [facility], [facility], etc)  Once the incision is completely dry and without drainage, it may be left open to air out.  Showering may begin 36-48 hours later.  Cleaning gently with soap and water.

## 2023-03-26 NOTE — Progress Notes (Signed)
[facility]  PROGRESS NOTE  Dean Mayer WUJ:[id number] DOB: 07-12-1941 DOA: 03/20/2023 PCP: Angelique Blonder, MD   Brief HPI:   82 y.o. male with medical history significant of  82yom w/ history of gout, HTN, HLD, BPH, hypothyroidism, throat cancer, positive ANA, positive rheumatoid factor who on 7/29 saw rheumatologist with complaints of left knee pain and swelling.  Fluid analysis at that time was consistent with gout.  He was treated with intra-articular steroid injection. initial tap 7/29 cultures grew MSS that was pan-sensitive. Patient discussed with orthopedics who recommended admission for diagnosis of MSSA left knee septic arthritis. During hospitalization patient underwent I/D of left knee. Patient there after was prescribed 6 week course of IV antibiotics with out patient f/u with orthopedics. Patient on discharged today, in attempt to transfer from wheel chair to his car pivoted and at that point has acute intense pain. Due to this pain he was unable to bear weight and as then sent back to ED for evaluation.  MRI of left knee showed horizontal tear of the anterior horn and body of the lateral meniscus extending to the free edge.   Assessment/Plan:   Septic arthritis, POA, complicated by lateral meniscus tear -Currently on IV Ancef for 6 weeks -Consulted Dr. Jena Gauss; no plan for treatment for meniscus tear -Weightbearing as tolerated, confirmed with Dr. Jena Gauss -Will need to go to skilled nursing facility for rehab  Gout -Continue allopurinol -S/p treatment for flareup with colchicine  Hyperlipidemia -Continue statin  Normocytic anemia -Hemoglobin is stable at 10.9 -Will check anemia panel  Hypothyroidism -Continue Synthroid  BPH -Continue tamsulosin  Hypertension -Continue amlodipine, lisinopril -Blood pressure well-controlled    Medications     allopurinol  100 mg Oral Daily   amLODipine  10 mg Oral Daily   ascorbic acid  1,000 mg Oral Daily    aspirin EC  325 mg Oral Daily   atorvastatin  40 mg Oral QPM   Chlorhexidine Gluconate Cloth  6 each Topical Daily   docusate sodium  100 mg Oral BID   heparin  5,000 Units Subcutaneous Q8H   levothyroxine  100 mcg Oral Q0600   lisinopril  5 mg Oral BID   sodium chloride flush  10-40 mL Intracatheter Q12H   tamsulosin  0.4 mg Oral Daily     Data Reviewed:   CBG:  Recent Labs  Lab 03/24/23 1102 03/24/23 1559 03/24/23 2008 03/25/23 0711 03/25/23 1945  GLUCAP 142* 111* 113* 105* 135*    SpO2: 98 % O2 Flow Rate (L/min): 0 L/min    Vitals:   03/25/23 0712 03/25/23 1944 03/26/23 0404 03/26/23 0721  BP: (!) 146/71 128/64 139/63 137/65  Pulse: 75 78 73 73  Resp: 16 17 17 16   Temp: 97.9 F (36.6 C) 97.7 F (36.5 C) 98.4 F (36.9 C) 98.3 F (36.8 C)  TempSrc: Oral   Oral  SpO2: 97% 95% 96% 98%  Weight:          Data Reviewed:  Basic Metabolic Panel: Recent Labs  Lab 03/20/23 0607 03/21/23 0656 03/21/23 1511 03/22/23 0455 03/23/23 0123  NA 135 134*  --  135 134*  K 3.6 3.7  --  3.9 3.8  CL 102 102  --  102 99  CO2 24 25  --  25 25  GLUCOSE 102* 124*  --  111* 140*  BUN 9 14  --  8 9  CREATININE 0.69 0.75 0.74 0.64 0.59*  CALCIUM 8.4* 8.4*  --  8.5*  8.3*    CBC: Recent Labs  Lab 03/20/23 0607 03/21/23 0656 03/21/23 1511 03/22/23 0455 03/23/23 0123  WBC 6.5 6.0 6.2 6.0 8.6  NEUTROABS  --  4.7  --   --   --   HGB 12.3* 10.5* 11.9* 10.9* 10.8*  HCT 38.3* 32.1* 36.3* 33.2* 32.5*  MCV 88.2 88.9 88.5 86.9 87.8  PLT 291 302 288 295 304    LFT Recent Labs  Lab 03/20/23 0607 03/22/23 0455 03/23/23 0123  AST 30 21 22   ALT 13 9 8   ALKPHOS 111 110 92  BILITOT 0.3 0.7 0.4  PROT 5.9* 5.7* 5.3*  ALBUMIN 1.9* 1.8* 1.7*     Antibiotics: Anti-infectives (From admission, onward)    Start     Dose/Rate Route Frequency Ordered Stop   03/21/23 1545  ceFAZolin (ANCEF) IVPB  Status:  Discontinued       Note to Pharmacy: Indication:  MSSA septic  knee First Dose: Yes Last Day of Therapy:  04/28/23 Labs - Once weekly:  CBC/D and BMP, ESR and CRP Method of administration: IV Push Pull PICC line at the completion of IV antibiotic therapy Method of administration may be changed at the discretion of home infusion pha   2 g Intravenous Every 8 hours 03/21/23 1445 03/21/23 1510   03/21/23 0230  ceFAZolin (ANCEF) IVPB 2g/100 mL premix        2 g 200 mL/hr over 30 Minutes Intravenous Every 8 hours 03/21/23 0220          DVT prophylaxis: SCDS  Code Status: Full code  Family Communication: Discussed with patient wife on phone   CONSULTS    Subjective   Denies pain.  Objective    Physical Examination:  General-appears in no acute distress Heart-S1-S2, regular, no murmur auscultated Lungs-clear to auscultation bilaterally, no wheezing or crackles auscultated Abdomen-soft, nontender, no organomegaly Extremities-left knee in dressing Neuro-alert, oriented x3, no focal deficit noted  Status is: Inpatient:          Meredeth Ide   [facility] If 7PM-7AM, please contact night-coverage at [email or web address], Office  [phone]   03/26/2023, 9:28 AM  LOS: 5 days

## 2023-03-26 NOTE — Plan of Care (Signed)

## 2023-03-26 NOTE — TOC Progression Note (Addendum)
Transition of Care [facility]) - Progression Note    Patient Details  Name: Dean Mayer MRN: [id number] Date of Birth: [birth year]  Transition of [facility]) CM/SW Contact  Lorri Frederick, LCSW Phone Number: 03/26/2023, 10:41 AM  Clinical Narrative:   [facility] still pending for SNF  1515: [employer] auth approved: 8/20 - 9/1 Level 1 Cert#[id number] .    Expected Discharge Plan: Skilled Nursing Facility Barriers to Discharge: Continued Medical Work up, SNF Pending bed offer  Expected Discharge Plan and Services In-house Referral: Clinical Social Work   Post Acute Care Choice: Skilled Nursing Facility Living arrangements for the past 2 months: Single Family Home                                       Social Determinants of Health (SDOH) Interventions SDOH Screenings   Food Insecurity: No Food Insecurity (12/01/2022)  Housing: Low Risk  (12/01/2022)  Transportation Needs: No Transportation Needs (12/01/2022)  Utilities: Not At Risk (12/01/2022)  Financial Resource Strain: Low Risk  (11/19/2021)   Received from [facility], [facility]  Physical Activity: Inactive (11/19/2021)   Received from [facility], [facility]  Social Connections: Unknown (11/26/2022)   Received from [facility], [facility]  Stress: No Stress Concern Present (03/16/2022)   Received from [facility], [facility]  Tobacco Use: Medium Risk (03/20/2023)    Readmission Risk Interventions     No data to display

## 2023-03-27 ENCOUNTER — Other Ambulatory Visit: Payer: Self-pay | Admitting: Internal Medicine

## 2023-03-27 ENCOUNTER — Telehealth: Payer: Self-pay

## 2023-03-27 DIAGNOSIS — S83242A Other tear of medial meniscus, current injury, left knee, initial encounter: Secondary | ICD-10-CM | POA: Diagnosis not present

## 2023-03-27 DIAGNOSIS — M00869 Arthritis due to other bacteria, unspecified knee: Secondary | ICD-10-CM | POA: Diagnosis not present

## 2023-03-27 DIAGNOSIS — M25462 Effusion, left knee: Secondary | ICD-10-CM

## 2023-03-27 MED ORDER — CEFAZOLIN IV (FOR PTA / DISCHARGE USE ONLY): 2.0000 g | Freq: Three times a day (TID) | INTRAVENOUS | 0 refills | Status: DC

## 2023-03-27 MED ORDER — OXYCODONE-ACETAMINOPHEN 5-325 MG PO TABS: 1.0000 | ORAL_TABLET | ORAL | 0 refills | Status: DC | PRN

## 2023-03-27 NOTE — Discharge Summary (Signed)
Physician Discharge Summary  Dean Mayer FAO:[id number] DOB: 1941-06-11 DOA: 03/20/2023  PCP: Angelique Blonder, MD  Admit date: 03/20/2023 Discharge date: 03/27/2023  Admitted From: home Discharge disposition: SNF   Recommendations for Outpatient Follow-Up:   Please obtain CMP/CBC in one week Follow-up with Dr. Jena Gauss in 2 weeks Follow-up with ID clinic while on IV antibiotics IV Ancef: Last Day of Therapy: 04/28/23    Discharge Diagnosis:   Principal Problem:   Painful knee    Discharge Condition: Improved.  Diet recommendation: Low sodium, heart healthy.    Wound care: None.  Code status: Full.   History of Present Illness:   82yom w/ history of gout, HTN, HLD, BPH, hypothyroidism, throat cancer, positive ANA, positive rheumatoid factor who on 7/29 saw rheumatologist with complaints of left knee pain and swelling.  Fluid analysis at that time was consistent with gout.  He was treated with intra-articular steroid injection. initial tap 7/29 cultures grew MSS that was pan-sensitive. Patient discussed with orthopedics who recommended admission for diagnosis of MSSA left knee septic arthritis. During hospitalization patient underwent I/D of left knee. Patient there after was prescribed 6 week course of IV antibiotics with out patient f/u with orthopedics. Patient on discharged today, in attempt to transfer from wheel chair to his car pivoted and at that point has acute intense pain. Due to this pain he was unable to bear weight and as then sent back to ED for evaluation.  MRI of left knee showed horizontal tear of the anterior horn and body of the lateral meniscus extending to the free edge.   Hospital Course by Problem:   Septic arthritis, POA, complicated by lateral meniscus tear -Currently on IV Ancef for 6 weeks -Consulted Dr. Jena Gauss; no plan for treatment for meniscus tear -Weightbearing as tolerated, confirmed with Dr. Jena Gauss -Will need to go to skilled  nursing facility for rehab   Gout -Continue allopurinol -S/p treatment for flareup with colchicine   Hyperlipidemia -Continue statin   Normocytic anemia -Hemoglobin is stable at 10.9 -outpatient follow up   Hypothyroidism -Continue Synthroid   BPH -Continue tamsulosin   Hypertension -Continue amlodipine, lisinopril -Blood pressure well-controlled      Medical Consultants:    ortho  Discharge Exam:   Vitals:   03/26/23 1922 03/27/23 0516  BP: 138/67 (!) 156/67  Pulse: 85 72  Resp: 17   Temp: 99.7 F (37.6 C) 98.2 F (36.8 C)  SpO2: 95% 95%   Vitals:   03/26/23 0721 03/26/23 1451 03/26/23 1922 03/27/23 0516  BP: 137/65 128/74 138/67 (!) 156/67  Pulse: 73 78 85 72  Resp: 16 17 17    Temp: 98.3 F (36.8 C) 98.2 F (36.8 C) 99.7 F (37.6 C) 98.2 F (36.8 C)  TempSrc: Oral Oral Oral Oral  SpO2: 98% 98% 95% 95%  Weight:        General exam: Appears calm and comfortable.    The results of significant diagnostics from this hospitalization (including imaging, microbiology, ancillary and laboratory) are listed below for reference.     Procedures and Diagnostic Studies:   MR KNEE LEFT WO CONTRAST  Result Date: 03/21/2023 CLINICAL DATA:  History of septic arthritis, left knee pain EXAM: MRI OF THE LEFT KNEE WITHOUT CONTRAST TECHNIQUE: Multiplanar, multisequence MR imaging of the knee was performed. No intravenous contrast was administered. COMPARISON:  None Available. FINDINGS: MENISCI Medial: Intact. Lateral: Horizontal tear of the anterior horn and body of the lateral meniscus extending to the  free edge. Small undersurface tear of the posterior horn of the lateral meniscus (image 23/series 35). LIGAMENTS Cruciates: ACL and PCL are intact. Collaterals: Medial collateral ligament is intact. Lateral collateral ligament complex is intact. CARTILAGE Patellofemoral: Cartilage fissuring of the trochlear groove. Mild partial-thickness cartilage loss of the lateral  trochlea and lateral patellar facet. Medial:  No chondral defect. Lateral: Partial thickness cartilage loss of the lateral femorotibial compartment. JOINT: Large joint effusion. Mild edema in Hoffa's fat. No plical thickening. POPLITEAL FOSSA: Popliteus tendon is intact. No Baker's cyst. EXTENSOR MECHANISM: Intact quadriceps tendon. Intact patellar tendon. Intact lateral patellar retinaculum. Intact medial patellar retinaculum. Intact MPFL. BONES: No aggressive osseous lesion. No fracture or dislocation. Subcortical marrow edema at the PCL origin and insertion and along the roof of the intercondylar notch. Bone island in the distal lateral femoral condyle. Other: Muscle edema in the popliteus muscle.  No muscle atrophy. IMPRESSION: 1. Large left knee joint effusion with low-level marrow edema in the proximal tibia and distal femur as well as muscle edema in the popliteus muscle. Overall findings are concerning for septic arthritis until proven otherwise. Recommend further evaluation with arthrocentesis. 2. Horizontal tear of the anterior horn and body of the lateral meniscus extending to the free edge. Small undersurface tear of the posterior horn of the lateral meniscus. 3. Cartilage fissuring of the trochlear groove. Mild partial-thickness cartilage loss of the lateral trochlea and lateral patellar facet. 4. Partial thickness cartilage loss of the lateral femorotibial compartment. 5. Muscle edema in the popliteus muscle. Electronically Signed   By: Elige Ko M.D.   On: 03/21/2023 10:48   DG Knee Complete 4 Views Left  Result Date: 03/21/2023 CLINICAL DATA:  Left knee pain, status post knee surgery on Friday. History of debridement for staph infection. Twisted left knee today getting out of a chair. EXAM: LEFT KNEE - COMPLETE 4+ VIEW COMPARISON:  03/11/2023. FINDINGS: No acute fracture or dislocation. Minimal degenerative changes are present. No periosteal elevation or bony erosions. There is a suprapatellar  joint effusion containing air. Vascular calcifications are noted in the soft tissues. IMPRESSION: 1. No acute osseous abnormality. 2. Small suprapatellar joint effusion containing air, which may be postsurgical versus residual infection. Electronically Signed   By: Thornell Sartorius M.D.   On: 03/21/2023 02:15   [place] EKG SITE RITE  Result Date: 03/20/2023 If Site Rite image not attached, placement could not be confirmed due to current cardiac rhythm.    Labs:   Basic Metabolic Panel: Recent Labs  Lab 03/21/23 0656 03/21/23 1511 03/22/23 0455 03/23/23 0123  NA 134*  --  135 134*  K 3.7  --  3.9 3.8  CL 102  --  102 99  CO2 25  --  25 25  GLUCOSE 124*  --  111* 140*  BUN 14  --  8 9  CREATININE 0.75 0.74 0.64 0.59*  CALCIUM 8.4*  --  8.5* 8.3*   GFR Estimated Creatinine Clearance: 98.8 mL/min (A) (by C-G formula based on SCr of 0.59 mg/dL (L)). Liver Function Tests: Recent Labs  Lab 03/22/23 0455 03/23/23 0123  AST 21 22  ALT 9 8  ALKPHOS 110 92  BILITOT 0.7 0.4  PROT 5.7* 5.3*  ALBUMIN 1.8* 1.7*   No results for input(s): "LIPASE", "AMYLASE" in the last 168 hours. No results for input(s): "AMMONIA" in the last 168 hours. Coagulation profile No results for input(s): "INR", "PROTIME" in the last 168 hours.  CBC: Recent Labs  Lab 03/21/23 430 [id number]  03/21/23 1511 03/22/23 0455 03/23/23 0123  WBC 6.0 6.2 6.0 8.6  NEUTROABS 4.7  --   --   --   HGB 10.5* 11.9* 10.9* 10.8*  HCT 32.1* 36.3* 33.2* 32.5*  MCV 88.9 88.5 86.9 87.8  PLT 302 288 295 304   Cardiac Enzymes: No results for input(s): "CKTOTAL", "CKMB", "CKMBINDEX", "TROPONINI" in the last 168 hours. BNP: Invalid input(s): "POCBNP" CBG: Recent Labs  Lab 03/25/23 0711 03/25/23 1945 03/26/23 1115 03/26/23 1607 03/26/23 2105  GLUCAP 105* 135* 150* 213* 169*   D-Dimer No results for input(s): "DDIMER" in the last 72 hours. Hgb A1c No results for input(s): "HGBA1C" in the last 72 hours. Lipid Profile No  results for input(s): "CHOL", "HDL", "LDLCALC", "TRIG", "CHOLHDL", "LDLDIRECT" in the last 72 hours. Thyroid function studies No results for input(s): "TSH", "T4TOTAL", "T3FREE", "THYROIDAB" in the last 72 hours.  Invalid input(s): "FREET3" Anemia work up Recent Labs    03/25/23 1335  VITAMINB12 974*  FOLATE 21.9  FERRITIN 315  TIBC 157*  IRON 46  RETICCTPCT 1.0   Microbiology Recent Results (from the past 240 hour(s))  Culture, blood (Routine X 2) w Reflex to ID Panel     Status: None   Collection Time: 03/17/23 11:40 AM   Specimen: BLOOD LEFT HAND  Result Value Ref Range Status   Specimen Description BLOOD LEFT HAND  Final   Special Requests   Final    BOTTLES DRAWN AEROBIC AND ANAEROBIC Blood Culture adequate volume   Culture   Final    NO GROWTH 5 DAYS Performed at [facility], [address], [place], [place] [address]    Report Status 03/22/2023 FINAL  Final  Culture, blood (Routine X 2) w Reflex to ID Panel     Status: None   Collection Time: 03/17/23 11:49 AM   Specimen: BLOOD RIGHT HAND  Result Value Ref Range Status   Specimen Description BLOOD RIGHT HAND  Final   Special Requests   Final    BOTTLES DRAWN AEROBIC AND ANAEROBIC Blood Culture adequate volume   Culture   Final    NO GROWTH 5 DAYS Performed at [facility], [address], [place], [place] [address]    Report Status 03/22/2023 FINAL  Final  Urine Culture (for pregnant, neutropenic or urologic patients or patients with an indwelling urinary catheter)     Status: Abnormal   Collection Time: 03/22/23  8:08 PM   Specimen: Urine, Clean Catch  Result Value Ref Range Status   Specimen Description URINE, CLEAN CATCH  Final   Special Requests NONE  Final   Culture (A)  Final    <10,000 COLONIES/mL INSIGNIFICANT GROWTH Performed at [facility], [address] [place], [place] [address]    Report Status 03/23/2023 FINAL  Final     Discharge Instructions:   Discharge  Instructions     [facility] pharmacist to adjust dose for Vancomycin, Aminoglycosides and other anti-infective therapies as requested by physician.   Complete by: As directed    [facility] to provide Cath Flo 2mg    Complete by: As directed    Administer for PICC line occlusion and as ordered by physician for other access device issues.   Anaphylaxis Kit: Provided to treat any anaphylactic reaction to the medication being provided to the patient if First Dose or when requested by physician   Complete by: As directed    Epinephrine 1mg /ml vial / amp: Administer 0.3mg  (0.41ml)  subcutaneously once for moderate to severe anaphylaxis, nurse to call physician and pharmacy when reaction occurs and call 911 if needed for immediate care   Diphenhydramine 50mg /ml IV vial: Administer 25-50mg  IV/IM PRN for first dose reaction, rash, itching, mild reaction, nurse to call physician and pharmacy when reaction occurs   Sodium Chloride 0.9% NS IV: Administer if needed for hypovolemic blood pressure drop or as ordered by physician after call to physician with anaphylactic reaction   Change dressing on IV access line weekly and PRN   Complete by: As directed    Diet - low sodium heart healthy   Complete by: As directed    Flush IV access with Sodium Chloride 0.9% and Heparin 10 units/ml or 100 units/ml   Complete by: As directed    Home infusion instructions - [facility]   Complete by: As directed    Instructions: Flush IV access with Sodium Chloride 0.9% and Heparin 10units/ml or 100units/ml   Change dressing on IV access line: Weekly and PRN   Instructions Cath Flo 2mg : Administer for PICC Line occlusion and as ordered by physician for other access device   [facility] pharmacist to adjust dose for: Vancomycin, Aminoglycosides and other anti-infective therapies as requested by physician   Increase activity slowly   Complete by: As directed    Method of  administration may be changed at the discretion of home infusion pharmacist based upon assessment of the patient and/or caregiver's ability to self-administer the medication ordered   Complete by: As directed    No wound care   Complete by: As directed       Allergies as of 03/27/2023   No Known Allergies      Medication List     STOP taking these medications    brimonidine 0.2 % ophthalmic solution Commonly known as: ALPHAGAN   HYDROcodone-acetaminophen 5-325 MG tablet Commonly known as: Norco       TAKE these medications    acetaminophen 500 MG tablet Commonly known as: TYLENOL Take 1,000 mg by mouth 2 (two) times daily as needed for mild pain or moderate pain.   allopurinol 100 MG tablet Commonly known as: ZYLOPRIM Take 1 tablet (100 mg total) by mouth daily.   amLODipine 10 MG tablet Commonly known as: NORVASC Take 10 mg by mouth daily.   ascorbic acid 500 MG tablet Commonly known as: VITAMIN C Take 1,000 mg by mouth daily.   aspirin EC 325 MG tablet Take 325 mg by mouth daily.   atorvastatin 40 MG tablet Commonly known as: LIPITOR Take 40 mg by mouth every evening.   ceFAZolin IVPB Commonly known as: ANCEF Inject 2 g into the vein every 8 (eight) hours. Indication:  MSSA septic knee First Dose: Yes Last Day of Therapy:  04/28/23 Labs - Once weekly:  CBC/D and BMP, Labs - Once weekly: ESR and CRP Method of administration: IV Push Pull PICC line at the completion of IV antibiotic therapy Method of administration may be changed at the discretion of home infusion pharmacist based upon assessment of the patient and/or caregiver's ability to self-administer the medication ordered. What changed: additional instructions   docusate sodium 100 MG capsule Commonly known as: COLACE Take 1 capsule (100 mg total) by mouth 2 (two) times daily.   Fish Oil 1200 MG Caps Take 1,200 mg by mouth daily.   fluticasone 50 MCG/ACT nasal spray Commonly known as:  FLONASE Place 2 sprays into the nose daily as needed for  allergies.   IRON COMPLEX PO Take 1 tablet by mouth daily.   levothyroxine 100 MCG tablet Commonly known as: SYNTHROID Take 100 mcg by mouth daily.   lisinopril 5 MG tablet Commonly known as: ZESTRIL Take 5 mg by mouth 2 (two) times daily.   ONE-A-DAY 50 PLUS PO Take 1 tablet by mouth daily.   oxyCODONE-acetaminophen 5-325 MG tablet Commonly known as: PERCOCET/ROXICET Take 1-2 tablets by mouth every 4 (four) hours as needed for severe pain.   polyethylene glycol powder 17 GM/SCOOP powder Commonly known as: GLYCOLAX/MIRALAX Mix as directed and take 1 capful (17 g) by mouth daily as needed for mild constipation.   senna-docusate 8.6-50 MG tablet Commonly known as: Senokot-S Take 1 tablet by mouth at bedtime as needed for mild constipation.   tamsulosin 0.4 MG Caps capsule Commonly known as: FLOMAX Take 0.4 mg by mouth daily.               Discharge Care Instructions  (From admission, onward)           Start     Ordered   03/27/23 0000  Change dressing on IV access line weekly and PRN  (Home infusion instructions - [facility] )        03/27/23 0857            Follow-up Information     Haddix, Gillie Manners, MD. Schedule an appointment as soon as possible for a visit in 1 week(s).   Specialty: Orthopedic Surgery Why: for wound check Contact information: [address] [place] [address] [phone]                  Time coordinating discharge: 45 min  Signed:  Joseph Art DO  [facility] 03/27/2023, 8:57 AM

## 2023-03-27 NOTE — Plan of Care (Signed)
  Problem: Education: Goal: Knowledge of General Education information will improve Description: Including pain rating scale, medication(s)/side effects and non-pharmacologic comfort measures Outcome: Progressing   Problem: Education: Goal: Knowledge of General Education information will improve Description: Including pain rating scale, medication(s)/side effects and non-pharmacologic comfort measures Outcome: Progressing   Problem: Education: Goal: Knowledge of General Education information will improve Description: Including pain rating scale, medication(s)/side effects and non-pharmacologic comfort measures Outcome: Progressing   Problem: Education: Goal: Knowledge of General Education information will improve Description: Including pain rating scale, medication(s)/side effects and non-pharmacologic comfort measures Outcome: Progressing   Problem: Clinical Measurements: Goal: Ability to maintain clinical measurements within normal limits will improve Outcome: Progressing Goal: Will remain free from infection Outcome: Progressing Goal: Diagnostic test results will improve Outcome: Progressing Goal: Respiratory complications will improve Outcome: Progressing Goal: Cardiovascular complication will be avoided Outcome: Progressing   Problem: Education: Goal: Knowledge of General Education information will improve Description: Including pain rating scale, medication(s)/side effects and non-pharmacologic comfort measures Outcome: Progressing

## 2023-03-27 NOTE — Telephone Encounter (Signed)
Last Fill: 03/22/2023 by Lurline Del, MD 03/05/2023 by Dr. Dimple Casey  Labs: 03/23/2023 RBC 3.70 Hemoglobin 10.8 HCT 32.5 Sodium 134 Glucose 140 Creatinine, Ser 0.59 Calcium 8.3 Total Protein 5.3 Albumin 1.7  03/14/2023 Uric Acid 4.8  Next Visit: no follow up on file  Last Visit: 03/04/2023  DX: Effusion, left knee   Current Dose per office note 03/04/2023: not mentioned  Prescription was filled on 03/22/2023 by another Doctor. Please refill if needed. Patient has been hospitalized on 03/11/2023, 03/14/2023, and 03/20/2023 in relation to his left knee. Per telephone note on 03/06/2023, Patient contacted the office to state his left knee was bothering him after the aspiration done on 03/04/2023.   Okay to refill Allopurinol?

## 2023-03-27 NOTE — TOC Transition Note (Signed)
Transition of Care [facility]) - CM/SW Discharge Note   Patient Details  Name: Dean Mayer MRN: [id number] Date of Birth: Nov 25, 1940  [facility] CM/SW Contact:  Lorri Frederick, LCSW Phone Number: 03/27/2023, 10:25 AM   Clinical Narrative:   Pt discharging to [facility].  RN call report to [phone].      Final next level of care: Skilled Nursing Facility Barriers to Discharge: Barriers Resolved   Patient Goals and CMS Choice      Discharge Placement                Patient chooses bed at:  [facility]) Patient to be transferred to facility by: [facility] Name of family member notified: wife Boneta Lucks in room Patient and family notified of of transfer: 03/27/23  Discharge Plan and Services Additional resources added to the After Visit Summary for   In-house Referral: Clinical Social Work   Post Acute Care Choice: Skilled Nursing Facility                               Social Determinants of Health (SDOH) Interventions SDOH Screenings   Food Insecurity: No Food Insecurity (12/01/2022)  Housing: Low Risk  (12/01/2022)  Transportation Needs: No Transportation Needs (12/01/2022)  Utilities: Not At Risk (12/01/2022)  Financial Resource Strain: Low Risk  (11/19/2021)   Received from [facility], [facility]  Physical Activity: Inactive (11/19/2021)   Received from [facility], [facility]  Social Connections: Unknown (11/26/2022)   Received from [facility], [facility]  Stress: No Stress Concern Present (03/16/2022)   Received from [facility], [facility]  Tobacco Use: Medium Risk (03/20/2023)     Readmission Risk Interventions     No data to display

## 2023-03-27 NOTE — Telephone Encounter (Signed)
Called [facility] to inform staff of upcoming appointment with Dr. Daiva Eves on 9/4 at 11:15. Left voicemail with appointment information. Requested staff  call to confirm appt.  [phone] Juanita Laster, RMA

## 2023-03-28 ENCOUNTER — Ambulatory Visit: Payer: Medicare HMO | Admitting: Vascular Surgery

## 2023-04-01 LAB — GLUCOSE, CAPILLARY
Glucose-Capillary: 115 mg/dL — ABNORMAL HIGH (ref 70–99)
Glucose-Capillary: 173 mg/dL — ABNORMAL HIGH (ref 70–99)
Glucose-Capillary: 104 mg/dL — ABNORMAL HIGH (ref 70–99)

## 2023-04-02 LAB — LAB REPORT - SCANNED
A1c: 5.9
EGFR: 90.4

## 2023-04-10 ENCOUNTER — Ambulatory Visit (INDEPENDENT_AMBULATORY_CARE_PROVIDER_SITE_OTHER): Payer: Medicare HMO | Admitting: Infectious Disease

## 2023-04-10 ENCOUNTER — Encounter: Payer: Self-pay | Admitting: Infectious Disease

## 2023-04-10 ENCOUNTER — Other Ambulatory Visit: Payer: Self-pay

## 2023-04-10 VITALS — BP 107/68 | HR 89 | Temp 97.7°F

## 2023-04-10 DIAGNOSIS — M009 Pyogenic arthritis, unspecified: Secondary | ICD-10-CM | POA: Insufficient documentation

## 2023-04-10 DIAGNOSIS — B9561 Methicillin susceptible Staphylococcus aureus infection as the cause of diseases classified elsewhere: Secondary | ICD-10-CM

## 2023-04-10 DIAGNOSIS — Z7185 Encounter for immunization safety counseling: Secondary | ICD-10-CM | POA: Diagnosis not present

## 2023-04-10 DIAGNOSIS — M00069 Staphylococcal arthritis, unspecified knee: Secondary | ICD-10-CM | POA: Diagnosis not present

## 2023-04-10 DIAGNOSIS — R7881 Bacteremia: Secondary | ICD-10-CM

## 2023-04-10 HISTORY — DX: Pyogenic arthritis, unspecified: M00.9

## 2023-04-10 NOTE — Progress Notes (Signed)
Subjective:  Chief complaint continued knee pain though is improved and also he has concerns about the antibiotics not being given correctly at the nursing facility  Patient ID: [facility], male    DOB: 11/02/1940, 82 y.o.   MRN: [id number]  HPI  Dean [facility] is an 82 year old man who was admitted to hospital with MSSA bacteremia secondary left septic knee arthritis.  His septic arthritis is likely already present when he had aspirate the knee in July with had greater than 75,000 white cells and crystals were seen but no cultures were obtained.  His blood cultures cleared and he had a successful I&D with Dr. Jena Gauss.  We did not pursue transesophageal echocardiogram given the fact that he has dysphagia and history of throat cancer.  He is currently on course to complete 6 weeks of antibiotics on 22 September.  Knee pain has improved.  It is still present though.  He is concerned that the nursing staff are not administering his IV antibiotics correctly.  Specifically is concerned about the sequence of flushes and heparin locks not being done correctly.  Additionally says that the cefazolin is now taking our running which makes me worry about the PICC line potentially having an occlusion in it.    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/10/2023   Throat cancer [facility])     Past Surgical History:  Procedure Laterality Date   CAROTID STENT     EYE SURGERY     I & D EXTREMITY Left 03/15/2023   Procedure: IRRIGATION AND DEBRIDEMENT LEFT KNEE;  Surgeon: Roby Lofts, MD;  Location: [facility] OR;  Service: Orthopedics;  Laterality: Left;   TONSILLECTOMY      Family History  Problem Relation Age of Onset   Stroke Mother    Heart attack Mother    Heart disease Brother       Social History   Socioeconomic History   Marital  status: Married    Spouse name: Not on file   Number of children: Not on file   Years of education: Not on file   Highest education level: Not on file  Occupational History   Not on file  Tobacco Use   Smoking status: Former    Current packs/day: 20.00    Average packs/day: 13.6 packs/day for 59.7 years (813.5 ttl pk-yrs)    Types: Cigarettes    Start date: 1965    Passive exposure: Past   Smokeless tobacco: Never  Vaping Use   Vaping status: Never Used  Substance and Sexual Activity   Alcohol use: No   Drug use: No   Sexual activity: Not on file  Other Topics Concern   Not on file  Social History Narrative   Not on file   Social Determinants of Health   Financial Resource Strain: Low Risk  (11/19/2021)   Received from [facility], [facility]   Overall Financial Resource Strain (CARDIA)    Difficulty of Paying Living Expenses: Not hard at all  Food Insecurity: No Food Insecurity (12/01/2022)   Hunger Vital Sign    Worried About Running Out of Food in the Last Year: Never true    Ran Out of Food in the Last Year: Never true  Transportation Needs: No Transportation Needs (12/01/2022)   PRAPARE - Transportation  Lack of Transportation (Medical): No    Lack of Transportation (Non-Medical): No  Physical Activity: Inactive (11/19/2021)   Received from [facility], [facility]   Exercise Vital Sign    Days of Exercise per Week: 0 days    Minutes of Exercise per Session: 0 min  Stress: No Stress Concern Present (03/16/2022)   Received from [facility], [facility] of Occupational Health - Occupational Stress Questionnaire    Feeling of Stress : Not at all  Social Connections: Unknown (11/26/2022)   Received from [facility], [facility]   Social Network    Social Network: Not on file    No Known Allergies   Current Outpatient Medications:    acetaminophen (TYLENOL) 500 MG tablet, Take 1,000 mg by mouth 2 (two) times daily as needed  for mild pain or moderate pain., Disp: , Rfl:    allopurinol (ZYLOPRIM) 100 MG tablet, Take 1 tablet (100 mg total) by mouth daily., Disp: 30 tablet, Rfl: 0   amLODipine (NORVASC) 10 MG tablet, Take 10 mg by mouth daily., Disp: , Rfl:    ascorbic acid (VITAMIN C) 500 MG tablet, Take 1,000 mg by mouth daily., Disp: , Rfl:    aspirin EC 325 MG tablet, Take 325 mg by mouth daily., Disp: , Rfl:    atorvastatin (LIPITOR) 40 MG tablet, Take 40 mg by mouth every evening., Disp: , Rfl:    ceFAZolin (ANCEF) IVPB, Inject 2 g into the vein every 8 (eight) hours. Indication:  MSSA septic knee First Dose: Yes Last Day of Therapy:  04/28/23 Labs - Once weekly:  CBC/D and BMP, Labs - Once weekly: ESR and CRP Method of administration: IV Push Pull PICC line at the completion of IV antibiotic therapy Method of administration may be changed at the discretion of home infusion pharmacist based upon assessment of the patient and/or caregiver's ability to self-administer the medication ordered., Disp: 120 Units, Rfl: 0   docusate sodium (COLACE) 100 MG capsule, Take 1 capsule (100 mg total) by mouth 2 (two) times daily., Disp: 100 capsule, Rfl: 0   fluticasone (FLONASE) 50 MCG/ACT nasal spray, Place 2 sprays into the nose daily as needed for allergies., Disp: , Rfl:    Iron Combinations (IRON COMPLEX PO), Take 1 tablet by mouth daily., Disp: , Rfl:    levothyroxine (SYNTHROID) 100 MCG tablet, Take 100 mcg by mouth daily., Disp: , Rfl:    lisinopril (ZESTRIL) 5 MG tablet, Take 5 mg by mouth 2 (two) times daily., Disp: , Rfl:    Multiple Vitamins-Minerals (ONE-A-DAY 50 PLUS PO), Take 1 tablet by mouth daily., Disp: , Rfl:    Omega-3 Fatty Acids (FISH OIL) 1200 MG CAPS, Take 1,200 mg by mouth daily., Disp: , Rfl:    oxyCODONE-acetaminophen (PERCOCET/ROXICET) 5-325 MG tablet, Take 1-2 tablets by mouth every 4 (four) hours as needed for severe pain., Disp: 4 tablet, Rfl: 0   polyethylene glycol powder (GLYCOLAX/MIRALAX) 17  GM/SCOOP powder, Mix as directed and take 1 capful (17 g) by mouth daily as needed for mild constipation., Disp: 238 g, Rfl: 0   senna-docusate (SENOKOT-S) 8.6-50 MG tablet, Take 1 tablet by mouth at bedtime as needed for mild constipation., Disp: 30 tablet, Rfl: 0   tamsulosin (FLOMAX) 0.4 MG CAPS capsule, Take 0.4 mg by mouth daily., Disp: , Rfl:     Review of Systems  Constitutional:  Negative for activity change, appetite change, chills, diaphoresis, fatigue, fever and unexpected weight change.  HENT:  Negative for congestion, rhinorrhea, sinus pressure, sneezing, sore throat and trouble swallowing.   Eyes:  Negative for photophobia and visual disturbance.  Respiratory:  Negative for cough, chest tightness, shortness of breath, wheezing and stridor.   Cardiovascular:  Negative for chest pain, palpitations and leg swelling.  Gastrointestinal:  Negative for abdominal distention, abdominal pain, anal bleeding, blood in stool, constipation, diarrhea, nausea and vomiting.  Genitourinary:  Negative for difficulty urinating, dysuria, flank pain and hematuria.  Musculoskeletal:  Positive for arthralgias and joint swelling. Negative for back pain, gait problem and myalgias.  Skin:  Negative for color change, pallor, rash and wound.  Neurological:  Negative for dizziness, tremors, weakness and light-headedness.  Hematological:  Negative for adenopathy. Does not bruise/bleed easily.  Psychiatric/Behavioral:  Negative for agitation, behavioral problems, confusion, decreased concentration, dysphoric mood and sleep disturbance.        Objective:   Physical Exam Constitutional:      Appearance: He is well-developed.  HENT:     Head: Normocephalic and atraumatic.  Eyes:     Conjunctiva/sclera: Conjunctivae normal.  Cardiovascular:     Rate and Rhythm: Normal rate and regular rhythm.  Pulmonary:     Effort: Pulmonary effort is normal. No respiratory distress.     Breath sounds: No wheezing.   Abdominal:     General: There is no distension.     Palpations: Abdomen is soft.  Musculoskeletal:        General: No tenderness. Normal range of motion.     Cervical back: Normal range of motion and neck supple.  Skin:    General: Skin is warm and dry.     Coloration: Skin is not pale.     Findings: No erythema or rash.  Neurological:     General: No focal deficit present.     Mental Status: He is alert and oriented to person, place, and time.  Psychiatric:        Mood and Affect: Mood normal.        Behavior: Behavior normal.        Thought Content: Thought content normal.        Judgment: Judgment normal.    Left knee    pICC          Assessment & Plan:   MSSA bacteremia due to infected native left knee septic arthritis of the knee:  He has had successful surgery is on course to complete cefazolin on 22 September.  I would like to make sure that the facility is troubleshooting his PICC line.    Does he need Cathflo?  Is he getting the correct sequence of flushes and heparin locks performed.  On exam he does not have evidence of a DVT and the PICC line appears clean on examination.  I will plan on getting some surveillance blood cultures greater than 2 weeks after he completes antibiotics.  I am scheduled him to come see me back in October.  Vaccine counseling: recommend updated COVID, flu shots  I have personally spent 26 minutes involved in face-to-face and non-face-to-face activities for this patient on the day of the visit. Professional time spent includes the following activities: Preparing to see the patient (review of tests), Obtaining and/or reviewing separately obtained history (admission/discharge record), Performing a medically appropriate examination and/or evaluation , Ordering medications/tests/procedures, referring and communicating with other health care professionals, Documenting clinical information in the EMR, Independently interpreting results  (not separately reported), Communicating results to the patient/family/caregiver, Counseling and educating  the patient/family/caregiver and Care coordination (not separately reported).

## 2023-04-17 ENCOUNTER — Inpatient Hospital Stay (HOSPITAL_COMMUNITY): Payer: Medicare HMO | Admitting: Anesthesiology

## 2023-04-17 ENCOUNTER — Encounter (HOSPITAL_COMMUNITY): Payer: Self-pay

## 2023-04-17 ENCOUNTER — Inpatient Hospital Stay (HOSPITAL_COMMUNITY)
Admission: EM | Admit: 2023-04-17 | Discharge: 2023-04-26 | DRG: 521 | Disposition: A | Discharge status: Skilled Nursing Facility | Payer: Medicare HMO | Source: Skilled Nursing Facility | Attending: Internal Medicine | Admitting: Internal Medicine

## 2023-04-17 ENCOUNTER — Emergency Department (HOSPITAL_COMMUNITY): Payer: Medicare HMO

## 2023-04-17 DIAGNOSIS — E039 Hypothyroidism, unspecified: Secondary | ICD-10-CM | POA: Diagnosis present

## 2023-04-17 DIAGNOSIS — Z8546 Personal history of malignant neoplasm of prostate: Secondary | ICD-10-CM

## 2023-04-17 DIAGNOSIS — Z85819 Personal history of malignant neoplasm of unspecified site of lip, oral cavity, and pharynx: Secondary | ICD-10-CM

## 2023-04-17 DIAGNOSIS — Z8673 Personal history of transient ischemic attack (TIA), and cerebral infarction without residual deficits: Secondary | ICD-10-CM

## 2023-04-17 DIAGNOSIS — S83207A Unspecified tear of unspecified meniscus, current injury, left knee, initial encounter: Secondary | ICD-10-CM | POA: Diagnosis present

## 2023-04-17 DIAGNOSIS — Z87442 Personal history of urinary calculi: Secondary | ICD-10-CM

## 2023-04-17 DIAGNOSIS — B9561 Methicillin susceptible Staphylococcus aureus infection as the cause of diseases classified elsewhere: Secondary | ICD-10-CM | POA: Diagnosis present

## 2023-04-17 DIAGNOSIS — W19XXXA Unspecified fall, initial encounter: Secondary | ICD-10-CM | POA: Diagnosis present

## 2023-04-17 DIAGNOSIS — M00062 Staphylococcal arthritis, left knee: Secondary | ICD-10-CM | POA: Diagnosis present

## 2023-04-17 DIAGNOSIS — I10 Essential (primary) hypertension: Secondary | ICD-10-CM | POA: Diagnosis present

## 2023-04-17 DIAGNOSIS — Z7982 Long term (current) use of aspirin: Secondary | ICD-10-CM | POA: Diagnosis not present

## 2023-04-17 DIAGNOSIS — R7881 Bacteremia: Secondary | ICD-10-CM | POA: Diagnosis present

## 2023-04-17 DIAGNOSIS — R55 Syncope and collapse: Secondary | ICD-10-CM | POA: Diagnosis not present

## 2023-04-17 DIAGNOSIS — D62 Acute posthemorrhagic anemia: Secondary | ICD-10-CM | POA: Diagnosis not present

## 2023-04-17 DIAGNOSIS — Z8589 Personal history of malignant neoplasm of other organs and systems: Secondary | ICD-10-CM

## 2023-04-17 DIAGNOSIS — R7303 Prediabetes: Secondary | ICD-10-CM | POA: Diagnosis present

## 2023-04-17 DIAGNOSIS — Z79890 Hormone replacement therapy (postmenopausal): Secondary | ICD-10-CM

## 2023-04-17 DIAGNOSIS — E43 Unspecified severe protein-calorie malnutrition: Secondary | ICD-10-CM | POA: Diagnosis present

## 2023-04-17 DIAGNOSIS — E785 Hyperlipidemia, unspecified: Secondary | ICD-10-CM | POA: Diagnosis present

## 2023-04-17 DIAGNOSIS — M009 Pyogenic arthritis, unspecified: Secondary | ICD-10-CM | POA: Diagnosis present

## 2023-04-17 DIAGNOSIS — Z87891 Personal history of nicotine dependence: Secondary | ICD-10-CM | POA: Diagnosis not present

## 2023-04-17 DIAGNOSIS — N138 Other obstructive and reflux uropathy: Secondary | ICD-10-CM | POA: Diagnosis present

## 2023-04-17 DIAGNOSIS — K219 Gastro-esophageal reflux disease without esophagitis: Secondary | ICD-10-CM | POA: Diagnosis present

## 2023-04-17 DIAGNOSIS — Z923 Personal history of irradiation: Secondary | ICD-10-CM | POA: Diagnosis not present

## 2023-04-17 DIAGNOSIS — S72002A Fracture of unspecified part of neck of left femur, initial encounter for closed fracture: Secondary | ICD-10-CM | POA: Diagnosis not present

## 2023-04-17 DIAGNOSIS — Z823 Family history of stroke: Secondary | ICD-10-CM

## 2023-04-17 DIAGNOSIS — M109 Gout, unspecified: Secondary | ICD-10-CM | POA: Diagnosis present

## 2023-04-17 DIAGNOSIS — Z6824 Body mass index (BMI) 24.0-24.9, adult: Secondary | ICD-10-CM

## 2023-04-17 DIAGNOSIS — N401 Enlarged prostate with lower urinary tract symptoms: Secondary | ICD-10-CM | POA: Diagnosis present

## 2023-04-17 DIAGNOSIS — S72012A Unspecified intracapsular fracture of left femur, initial encounter for closed fracture: Principal | ICD-10-CM | POA: Diagnosis present

## 2023-04-17 DIAGNOSIS — I330 Acute and subacute infective endocarditis: Secondary | ICD-10-CM | POA: Diagnosis present

## 2023-04-17 DIAGNOSIS — I951 Orthostatic hypotension: Secondary | ICD-10-CM | POA: Diagnosis not present

## 2023-04-17 DIAGNOSIS — R42 Dizziness and giddiness: Secondary | ICD-10-CM

## 2023-04-17 DIAGNOSIS — Z79899 Other long term (current) drug therapy: Secondary | ICD-10-CM

## 2023-04-17 DIAGNOSIS — Y92122 Bedroom in nursing home as the place of occurrence of the external cause: Secondary | ICD-10-CM | POA: Diagnosis not present

## 2023-04-17 DIAGNOSIS — S72042A Displaced fracture of base of neck of left femur, initial encounter for closed fracture: Secondary | ICD-10-CM | POA: Diagnosis not present

## 2023-04-17 DIAGNOSIS — Z8249 Family history of ischemic heart disease and other diseases of the circulatory system: Secondary | ICD-10-CM

## 2023-04-17 DIAGNOSIS — M00069 Staphylococcal arthritis, unspecified knee: Secondary | ICD-10-CM | POA: Diagnosis not present

## 2023-04-17 LAB — CBC WITH DIFFERENTIAL/PLATELET
Abs Immature Granulocytes: 0.02 10*3/uL (ref 0.00–0.07)
Basophils Absolute: 0 10*3/uL (ref 0.0–0.1)
Basophils Relative: 0 %
Eosinophils Absolute: 0 10*3/uL (ref 0.0–0.5)
Eosinophils Relative: 1 %
HCT: 36 % — ABNORMAL LOW (ref 39.0–52.0)
Hemoglobin: 11.3 g/dL — ABNORMAL LOW (ref 13.0–17.0)
Immature Granulocytes: 0 %
Lymphocytes Relative: 20 %
Lymphs Abs: 0.9 10*3/uL (ref 0.7–4.0)
MCH: 27.6 pg (ref 26.0–34.0)
MCHC: 31.4 g/dL (ref 30.0–36.0)
MCV: 88 fL (ref 80.0–100.0)
Monocytes Absolute: 0.3 10*3/uL (ref 0.1–1.0)
Monocytes Relative: 7 %
Neutro Abs: 3.3 10*3/uL (ref 1.7–7.7)
Neutrophils Relative %: 72 %
Platelets: 243 10*3/uL (ref 150–400)
RBC: 4.09 MIL/uL — ABNORMAL LOW (ref 4.22–5.81)
RDW: 13.9 % (ref 11.5–15.5)
WBC: 4.6 10*3/uL (ref 4.0–10.5)
nRBC: 0 % (ref 0.0–0.2)

## 2023-04-17 LAB — TYPE AND SCREEN
ABO/RH(D): A POS
Antibody Screen: NEGATIVE

## 2023-04-17 LAB — BASIC METABOLIC PANEL
Anion gap: 9 (ref 5–15)
BUN: 9 mg/dL (ref 8–23)
CO2: 26 mmol/L (ref 22–32)
Calcium: 8.7 mg/dL — ABNORMAL LOW (ref 8.9–10.3)
Chloride: 100 mmol/L (ref 98–111)
Creatinine, Ser: 0.63 mg/dL (ref 0.61–1.24)
GFR, Estimated: >60 mL/min (ref >60)
Glucose, Bld: 119 mg/dL — ABNORMAL HIGH (ref 70–99)
Potassium: 3.6 mmol/L (ref 3.5–5.1)
Sodium: 135 mmol/L (ref 135–145)

## 2023-04-17 LAB — PROTIME-INR
INR: 1.1 (ref 0.8–1.2)
Prothrombin Time: 14.5 s (ref 11.4–15.2)

## 2023-04-17 LAB — ABO/RH: ABO/RH(D): A POS

## 2023-04-17 MED ORDER — POLYETHYLENE GLYCOL 3350 17 G PO PACK: 17.0000 g | PACK | Freq: Every day | ORAL | Status: DC | PRN

## 2023-04-17 MED ORDER — HYDROCODONE-ACETAMINOPHEN 5-325 MG PO TABS
1.0000 | ORAL_TABLET | Freq: Four times a day (QID) | ORAL | Status: DC | PRN
  Administered 2023-04-18: 2 via ORAL
  Administered 2023-04-18: 1 via ORAL
  Administered 2023-04-19 – 2023-04-23 (×2): 2 via ORAL
  Filled 2023-04-17: qty 1
  Filled 2023-04-17 (×3): qty 2

## 2023-04-17 MED ORDER — ALLOPURINOL 100 MG PO TABS
100.0000 mg | ORAL_TABLET | Freq: Every day | ORAL | Status: DC
  Administered 2023-04-18 – 2023-04-26 (×9): 100 mg via ORAL
  Filled 2023-04-17 (×9): qty 1

## 2023-04-17 MED ORDER — SODIUM CHLORIDE 0.9 % IV SOLN: INTRAVENOUS | Status: DC

## 2023-04-17 MED ORDER — FENTANYL CITRATE PF 50 MCG/ML IJ SOSY
50.0000 ug | PREFILLED_SYRINGE | INTRAMUSCULAR | Status: AC | PRN
  Administered 2023-04-17 (×2): 50 ug via INTRAVENOUS
  Filled 2023-04-17 (×2): qty 1

## 2023-04-17 MED ORDER — SENNOSIDES-DOCUSATE SODIUM 8.6-50 MG PO TABS: 1.0000 | ORAL_TABLET | Freq: Every evening | ORAL | Status: DC | PRN

## 2023-04-17 MED ORDER — BUPIVACAINE HCL (PF) 0.5 % IJ SOLN
INTRAMUSCULAR | Status: DC | PRN
Start: 2023-04-17 — End: 2023-04-17
  Administered 2023-04-17: 20 mL

## 2023-04-17 MED ORDER — CEFAZOLIN IV (FOR PTA / DISCHARGE USE ONLY): 2.0000 g | Freq: Three times a day (TID) | INTRAVENOUS | Status: DC

## 2023-04-17 MED ORDER — MORPHINE SULFATE (PF) 2 MG/ML IV SOLN
0.5000 mg | INTRAVENOUS | Status: DC | PRN
  Administered 2023-04-18 – 2023-04-19 (×5): 0.5 mg via INTRAVENOUS
  Filled 2023-04-17 (×5): qty 1

## 2023-04-17 MED ORDER — CHLORHEXIDINE GLUCONATE CLOTH 2 % EX PADS
6.0000 | MEDICATED_PAD | Freq: Every day | CUTANEOUS | Status: DC
  Administered 2023-04-17 – 2023-04-18 (×2): 6 via TOPICAL

## 2023-04-17 MED ORDER — LEVOTHYROXINE SODIUM 100 MCG PO TABS: 100.0000 ug | ORAL_TABLET | Freq: Every day | ORAL | Status: DC

## 2023-04-17 MED ORDER — HYDRALAZINE HCL 20 MG/ML IJ SOLN: 5.0000 mg | Freq: Four times a day (QID) | INTRAMUSCULAR | Status: DC | PRN

## 2023-04-17 MED ORDER — AMLODIPINE BESYLATE 10 MG PO TABS
10.0000 mg | ORAL_TABLET | Freq: Every day | ORAL | Status: DC
  Administered 2023-04-18 – 2023-04-20 (×3): 10 mg via ORAL
  Filled 2023-04-17 (×3): qty 1

## 2023-04-17 MED ORDER — TAMSULOSIN HCL 0.4 MG PO CAPS
0.4000 mg | ORAL_CAPSULE | Freq: Every day | ORAL | Status: DC
  Administered 2023-04-18 – 2023-04-26 (×9): 0.4 mg via ORAL
  Filled 2023-04-17 (×9): qty 1

## 2023-04-17 MED ORDER — SODIUM CHLORIDE 0.9% FLUSH: 10.0000 mL | INTRAVENOUS | Status: DC | PRN

## 2023-04-17 MED ORDER — HEPARIN SODIUM (PORCINE) 5000 UNIT/ML IJ SOLN
5000.0000 [IU] | Freq: Three times a day (TID) | INTRAMUSCULAR | Status: AC
  Administered 2023-04-17: 5000 [IU] via SUBCUTANEOUS
  Filled 2023-04-17: qty 1

## 2023-04-17 MED ORDER — LISINOPRIL 5 MG PO TABS
5.0000 mg | ORAL_TABLET | Freq: Two times a day (BID) | ORAL | Status: DC
  Administered 2023-04-17 – 2023-04-20 (×6): 5 mg via ORAL
  Filled 2023-04-17 (×6): qty 1

## 2023-04-17 MED ORDER — OMEGA-3-ACID ETHYL ESTERS 1 G PO CAPS
1.0000 g | ORAL_CAPSULE | Freq: Every day | ORAL | Status: DC
  Administered 2023-04-18 – 2023-04-26 (×8): 1 g via ORAL
  Filled 2023-04-17 (×9): qty 1

## 2023-04-17 MED ORDER — POLYETHYLENE GLYCOL 3350 17 GM/SCOOP PO POWD: 17.0000 g | Freq: Every day | ORAL | Status: DC | PRN

## 2023-04-17 MED ORDER — ATORVASTATIN CALCIUM 40 MG PO TABS
40.0000 mg | ORAL_TABLET | Freq: Every evening | ORAL | Status: DC
  Administered 2023-04-17 – 2023-04-25 (×9): 40 mg via ORAL
  Filled 2023-04-17 (×9): qty 1

## 2023-04-17 MED ORDER — LEVOTHYROXINE SODIUM 112 MCG PO TABS
112.0000 ug | ORAL_TABLET | Freq: Every day | ORAL | Status: DC
  Administered 2023-04-19: 112 ug via ORAL
  Filled 2023-04-17 (×2): qty 1

## 2023-04-17 MED ORDER — FLUTICASONE PROPIONATE 50 MCG/ACT NA SUSP: 2.0000 | Freq: Every day | NASAL | Status: DC | PRN

## 2023-04-17 MED ORDER — CEFAZOLIN SODIUM-DEXTROSE 2-4 GM/100ML-% IV SOLN
2.0000 g | Freq: Three times a day (TID) | INTRAVENOUS | Status: DC
  Administered 2023-04-17 – 2023-04-22 (×13): 2 g via INTRAVENOUS
  Filled 2023-04-17 (×14): qty 100

## 2023-04-17 NOTE — ED Provider Notes (Signed)
[facility] Provider Note   CSN: [id number] Arrival date & time: 04/17/23  1420     History  Chief Complaint  Patient presents with   Hip Pain    Dean Mayer is a 82 y.o. male.  He has a history of stroke, peripheral vascular disease, and was recently admitted to the [facility] for knee infection and ultimately also require meniscal surgery by Dr. Jena Gauss.  He is at rehab now.  He said today he had gone to the bathroom and then was going to get some close out of the dresser and when he stood up he was acutely dizzy, fell to the ground onto his left hip.  Complaining of severe left hip pain.  He said he has had about 5 of those dizzy spells before.  He does not think he lost consciousness.  No headache chest pain abdominal pain neck pain.  Has been eating and drinking well.  Is on IV antibiotics for the knee infection.  The history is provided by the patient.  Hip Pain This is a new problem. The current episode started 6 to 12 hours ago. The problem occurs constantly. The problem has not changed since onset.Pertinent negatives include no chest pain, no abdominal pain, no headaches and no shortness of breath. The symptoms are aggravated by bending and twisting. Nothing relieves the symptoms. He has tried rest for the symptoms. The treatment provided no relief.       Home Medications Prior to Admission medications   Medication Sig Start Date End Date Taking? Authorizing Provider  acetaminophen (TYLENOL) 500 MG tablet Take 1,000 mg by mouth 2 (two) times daily as needed for mild pain or moderate pain.    [provider]  allopurinol (ZYLOPRIM) 100 MG tablet Take 1 tablet (100 mg total) by mouth daily. 03/05/23   Fuller Plan, MD  amLODipine (NORVASC) 10 MG tablet Take 10 mg by mouth daily.    [provider]  ascorbic acid (VITAMIN C) 500 MG tablet Take 1,000 mg by mouth daily.    [provider]  aspirin EC  325 MG tablet Take 325 mg by mouth daily.    [provider]  atorvastatin (LIPITOR) 40 MG tablet Take 40 mg by mouth every evening.    [provider]  ceFAZolin (ANCEF) IVPB Inject 2 g into the vein every 8 (eight) hours. Indication:  MSSA septic knee First Dose: Yes Last Day of Therapy:  04/28/23 Labs - Once weekly:  CBC/D and BMP, Labs - Once weekly: ESR and CRP Method of administration: IV Push Pull PICC line at the completion of IV antibiotic therapy Method of administration may be changed at the discretion of home infusion pharmacist based upon assessment of the patient and/or caregiver's ability to self-administer the medication ordered. 03/27/23 05/04/23  Joseph Art, DO  docusate sodium (COLACE) 100 MG capsule Take 1 capsule (100 mg total) by mouth 2 (two) times daily. 03/20/23   Lanae Boast, MD  fluticasone (FLONASE) 50 MCG/ACT nasal spray Place 2 sprays into the nose daily as needed for allergies.    [provider]  Iron Combinations (IRON COMPLEX PO) Take 1 tablet by mouth daily.    [provider]  levothyroxine (SYNTHROID) 100 MCG tablet Take 100 mcg by mouth daily.    [provider]  lisinopril (ZESTRIL) 5 MG tablet Take 5 mg by mouth 2 (two) times daily.    [provider]  Multiple  Vitamins-Minerals (ONE-A-DAY 50 PLUS PO) Take 1 tablet by mouth daily.    [provider]  Omega-3 Fatty Acids (FISH OIL) 1200 MG CAPS Take 1,200 mg by mouth daily.    [provider]  oxyCODONE-acetaminophen (PERCOCET/ROXICET) 5-325 MG tablet Take 1-2 tablets by mouth every 4 (four) hours as needed for severe pain. 03/27/23   Joseph Art, DO  polyethylene glycol powder (GLYCOLAX/MIRALAX) 17 GM/SCOOP powder Mix as directed and take 1 capful (17 g) by mouth daily as needed for mild constipation. 03/20/23   Lanae Boast, MD  senna-docusate (SENOKOT-S) 8.6-50 MG tablet Take 1 tablet by mouth at bedtime as needed for mild  constipation. 03/20/23   Lanae Boast, MD  tamsulosin (FLOMAX) 0.4 MG CAPS capsule Take 0.4 mg by mouth daily.    [provider]      Allergies    Patient has no known allergies.    Review of Systems   Review of Systems  Respiratory:  Negative for shortness of breath.   Cardiovascular:  Negative for chest pain.  Gastrointestinal:  Negative for abdominal pain.  Musculoskeletal:  Negative for neck pain.  Neurological:  Positive for dizziness. Negative for headaches.    Physical Exam Updated Vital Signs BP (!) 178/86 (BP Location: Left Arm)   Pulse 89   Temp 98 F (36.7 C) (Oral)   Resp 18   SpO2 99%  Physical Exam Vitals and nursing note reviewed.  Constitutional:      General: He is not in acute distress.    Appearance: Normal appearance. He is well-developed.  HENT:     Head: Normocephalic and atraumatic.  Eyes:     Conjunctiva/sclera: Conjunctivae normal.  Cardiovascular:     Rate and Rhythm: Normal rate and regular rhythm.     Heart sounds: No murmur heard. Pulmonary:     Effort: Pulmonary effort is normal. No respiratory distress.     Breath sounds: Normal breath sounds.  Abdominal:     Palpations: Abdomen is soft.     Tenderness: There is no abdominal tenderness. There is no guarding or rebound.  Musculoskeletal:        General: Tenderness and deformity present.     Cervical back: Neck supple.     Comments: He has some tenderness diffusely around his left hip.  His leg is shortened.  There is a healing surgical scar over his knee and there is no significant erythema.  Skin:    General: Skin is warm and dry.     Capillary Refill: Capillary refill takes less than 2 seconds.  Neurological:     General: No focal deficit present.     Mental Status: He is alert.     ED Results / Procedures / Treatments   Labs (all labs ordered are listed, but only abnormal results are displayed) Labs Reviewed  BASIC METABOLIC PANEL - Abnormal; Notable for the following  components:      Result Value   Glucose, Bld 119 (*)    Calcium 8.7 (*)    All other components within normal limits  CBC WITH DIFFERENTIAL/PLATELET - Abnormal; Notable for the following components:   RBC 4.09 (*)    Hemoglobin 11.3 (*)    HCT 36.0 (*)    All other components within normal limits  PROTIME-INR  BASIC METABOLIC PANEL  CBC  TYPE AND SCREEN  ABO/RH    EKG EKG Interpretation Date/Time:  Wednesday April 17 2023 14:32:28 EDT Ventricular Rate:  87 PR Interval:  225  QRS Duration:  89 QT Interval:  370 QTC Calculation: 446 R Axis:   -17  Text Interpretation: Sinus or ectopic atrial rhythm Prolonged PR interval Borderline left axis deviation Abnormal R-wave progression, early transition No significant change since prior 8/24 Confirmed by Meridee Score [phone]) on 04/17/2023 2:35:02 PM  Radiology DG Chest Port 1 View  Result Date: 04/17/2023 CLINICAL DATA:  Fall. Left hip fracture. Preop chest radiograph for left hip surgery. EXAM: PORTABLE CHEST 1 VIEW COMPARISON:  Chest radiograph dated 12/01/2022. FINDINGS: Right-sided PICC with tip at the cavoatrial junction. No focal consolidation, pleural effusion, pneumothorax. The cardiac silhouette is within normal limits. Atherosclerotic calcification of the aorta. No acute osseous pathology. IMPRESSION: No active disease. Electronically Signed   By: Elgie Collard M.D.   On: 04/17/2023 16:10   DG Hip Unilat With Pelvis 2-3 Views Left  Result Date: 04/17/2023 CLINICAL DATA:  Fall with left hip pain EXAM: DG HIP (WITH OR WITHOUT PELVIS) 2-3V LEFT COMPARISON:  None Available. FINDINGS: Displaced femoral neck fracture on the left. No intertrochanteric component is evident. No other pelvic fracture. No significant degenerative change of the hip joints. IMPRESSION: Displaced left femoral neck fracture. Electronically Signed   By: Paulina Fusi M.D.   On: 04/17/2023 16:07    Procedures Procedures    Medications Ordered in  ED Medications  HYDROcodone-acetaminophen (NORCO/VICODIN) 5-325 MG per tablet 1-2 tablet (has no administration in time range)  morphine (PF) 2 MG/ML injection 0.5 mg (has no administration in time range)  heparin injection 5,000 Units (has no administration in time range)  0.9 %  sodium chloride infusion (has no administration in time range)  senna-docusate (Senokot-S) tablet 1 tablet (has no administration in time range)  allopurinol (ZYLOPRIM) tablet 100 mg (has no administration in time range)  amLODipine (NORVASC) tablet 10 mg (has no administration in time range)  atorvastatin (LIPITOR) tablet 40 mg (has no administration in time range)  ceFAZolin (ANCEF) IVPB (has no administration in time range)  fluticasone (FLONASE) 50 MCG/ACT nasal spray 2 spray (has no administration in time range)  levothyroxine (SYNTHROID) tablet 100 mcg (has no administration in time range)  lisinopril (ZESTRIL) tablet 5 mg (has no administration in time range)  omega-3 acid ethyl esters (LOVAZA) capsule 1 g (has no administration in time range)  polyethylene glycol powder (GLYCOLAX/MIRALAX) container 17 g (has no administration in time range)  tamsulosin (FLOMAX) capsule 0.4 mg (has no administration in time range)  hydrALAZINE (APRESOLINE) injection 5 mg (has no administration in time range)  fentaNYL (SUBLIMAZE) injection 50 mcg (50 mcg Intravenous Given 04/17/23 1610)    ED Course/ Medical Decision Making/ A&P Clinical Course as of 04/17/23 1759  Wed Apr 17, 2023  1536 Discussed with orthopedics Dr. Jena Gauss.  He said the patient was fine to be able to eat nothing would happen before tomorrow.  He also said he does not do arthroplasties and so he would have to reach out to one of his partners.  Recommend medicine admission to [facility] and orthopedics will consult on patient. [MB]  1620 Discussed with [facility] hospitalist who will evaluate patient for admission.  I did update patient's wife who is quite  concerned about these dizzy spells that he has been having at rehab.  I let her know that I would let the medicine team know about this. [MB]    Clinical Course User Index [MB] Terrilee Files, MD  Medical Decision Making Amount and/or Complexity of Data Reviewed Labs: ordered. Radiology: ordered.  Risk Prescription drug management. Decision regarding hospitalization.   This patient complains of left hip pain after dizzy spell and fall; this involves an extensive number of treatment Options and is a complaint that carries with it a high risk of complications and morbidity. The differential includes fracture, contusion, stroke, bleed, dizziness, arrhythmia, hypovolemia  I ordered, reviewed and interpreted labs, which included CBC with normal white count stable low hemoglobin, chemistries unremarkable I ordered medication IV pain medication and reviewed PMP when indicated. I ordered imaging studies which included chest x-ray and left hip x-ray and I independently    visualized and interpreted imaging which showed femoral neck fracture Additional history obtained from EMS and patient's wife Previous records obtained and reviewed in epic including recent discharge summary I consulted orthopedics Dr. Jena Gauss and [facility] Dr. Dayna Barker and discussed lab and imaging findings and discussed disposition.  Cardiac monitoring reviewed, sinus rhythm Social determinants considered, patient physically inactive Critical Interventions: None  After the interventions stated above, I reevaluated the patient and found patient be neurovascularly intact unable to range of motion left hip Admission and further testing considered, patient will need admission to the [facility] for further evaluation by orthopedics and medical workup of patient's dizziness.  Patient and wife in agreement with plan for admission.         Final Clinical Impression(s) / ED  Diagnoses Final diagnoses:  Fall, initial encounter  Closed left hip fracture, initial encounter [facility])  Dizziness    Rx / DC Orders ED Discharge Orders     None         Terrilee Files, MD 04/17/23 1801

## 2023-04-17 NOTE — ED Triage Notes (Signed)
[facility] states pt coming from [facility] for a hip fracture. Pt had a fall this morning but refused to got to hospital so had mobile xray done and found left hip fracture. Pt is at [facility] for rehab for left knee surgery and being septic. Pt has PICC in right arm.

## 2023-04-17 NOTE — Anesthesia Preprocedure Evaluation (Signed)
Anesthesia Evaluation  Patient identified by MRN, date of birth, ID band Patient awake    Reviewed: Allergy & Precautions, NPO status , Patient's Chart, lab work & pertinent test results, reviewed documented beta blocker date and time   Airway Mallampati: III       Dental  (+) Partial Upper   Pulmonary former smoker   breath sounds clear to auscultation       Cardiovascular hypertension, + Peripheral Vascular Disease  (-) CAD, (-) Past MI, (-) Cardiac Stents and (-) CABG  Rhythm:Regular Rate:Normal     Neuro/Psych neg Seizures PSYCHIATRIC DISORDERS      Carotid stent CVA, No Residual Symptoms    GI/Hepatic ,GERD  ,,  Endo/Other  Hypothyroidism    Renal/GU Renal disease     Musculoskeletal  (+) Arthritis ,    Abdominal   Peds  Hematology   Anesthesia Other Findings   Reproductive/Obstetrics                              Anesthesia Physical Anesthesia Plan  ASA: 3  Anesthesia Plan: Regional   Post-op Pain Management: Regional block*   Induction:   PONV Risk Score and Plan:   Airway Management Planned:   Additional Equipment:   Intra-op Plan:   Post-operative Plan:   Informed Consent: I have reviewed the patients History and Physical, chart, labs and discussed the procedure including the risks, benefits and alternatives for the proposed anesthesia with the patient or authorized representative who has indicated his/her understanding and acceptance.       Plan Discussed with:   Anesthesia Plan Comments:          Anesthesia Quick Evaluation

## 2023-04-17 NOTE — Anesthesia Procedure Notes (Signed)
Anesthesia Regional Block: Femoral nerve block   Pre-Anesthetic Checklist: , timeout performed,  Correct Patient, Correct Site, Correct Laterality,  Correct Procedure, Correct Position, site marked,  Risks and benefits discussed,  Surgical consent,  Pre-op evaluation,  At surgeon's request and post-op pain management  Laterality: Left  Prep: Maximum Sterile Barrier Precautions used, chloraprep       Needles:  Injection technique: Single-shot  Needle Type: Echogenic Stimulator Needle     Needle Length: 10cm      Additional Needles:   Procedures:,,,, ultrasound used (permanent image in chart),,    Narrative:  Start time: 04/17/2023 6:35 PM End time: 04/17/2023 6:40 PM Injection made incrementally with aspirations every 5 mL.  Performed by: Personally  Anesthesiologist: Mariann Barter, MD  Additional Notes: Patient tolerated the procedure well without complications

## 2023-04-17 NOTE — Progress Notes (Signed)
Patient reports patient off the unit. Tomasita Morrow, RN VAST

## 2023-04-17 NOTE — H&P (Signed)
History and Physical    Dean Mayer YQM:[id number] DOB: [birth year] DOA: 04/17/2023  PCP: Angelique Blonder, MD   Patient coming from: SNF  Chief Complaint  Patient presents with   Hip Pain     HPI:82 year old male with significant medical history including gout, HTN, HLD, previous, history of throat cancer, hypothyroidism, rheumatoid factor positive/positive ANA recently admitted at [facility] 8/14 -8/21 with left knee meniscal tear-managed without surgery and previously hospitalized 8/8 -8/14 for MSSA left knee septic arthritis.?  Acute gout flare of presented after a fall at the rehab center.  He had a mechanical fall in the morning initially refused to go to hospital and had x-ray done that showed left hip fracture so sent to the ED.   Pt is at [facility] for rehab. He reports having dizzy spell and fell at SNF while he was bending to get his walker he got dizzy. As per wife he was having few dizzy spells at SNF mostly walking. He has lost about 40 lb since April, and he is having loss of appetite since he has been sick.He had his routine colonoscopy march of 2024 and had polyps. He is not on blood thinners. Patient otherwise denies any nausea, vomiting, chest pain, shortness of breath, fever, chills, headache, focal weakness, numbness tingling, speech difficulties.  Dr Jena Gauss consulted, admission requested at [facility] and okay for diet today  In the ED: vital signs BP 178/86 saturating 98% on room air afebrile BMP stable CBC mild anemia X-ray left hip >> displaced left femoral neck fracture Chest x-ray>> no active disease, right-sided PICC in place. EKG personally reviewed sinus/ectopic atrial rhythm prolonged PR unchanged from 8/24   Assessment/Plan Principal Problem:   Left displaced femoral neck fracture (HCC) Active Problems:   Hypertension   Hypothyroidism   BPH with obstruction/lower urinary tract symptoms   Septic arthritis of knee (HCC)  Acute left femoral neck  fracture: Following a fall at a skilled nursing facility.  Patient will be admitted Dr. Jena Gauss has been consulted, okay to feed today, keep n.p.o. past midnight, pain control with oral/IV opiates. He has no known CKD/CAD/CVA/CHF or IDDM but functional status recently limited due to his left knee septic arthritis and injury but before that METS>4, recent echo 03/17/2023 with EF 65 to 70%,no RWMA, RV systolic function normal mitral valve normal. Carotid duplex ordered.  Dizzy spells/presyncope: Unclear etiology of presymcope. He had lost weight too- last colonoscopy negative in march. in the ED vitals are stable no evidence of hypotension, labs do not indicate any kind of acute kidney injury/dehydration.  Recent echo with normal EF.  Check orthostatic once able, keep gentle IV fluids. EKG in ED  pending. Check Carotid duplex.  MSSA septic arthritis left knee: He is on 6 weeks of IV Ancef with PICC line until April 28, 2023.  Followed by Dr. 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  Unintentional weight loss ~40 lb since April 2024: He did have colonoscopy which was normal per patient back in March, does have loss of appetite,?  If it is related to his abnormal vascular finding.  He is followed ryes from rheumatology for question of vasculitis.  RD consult for wt loss and loss of appetite.  May need further workup as OP.  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 Differential considerations include infectious/inflammatory aortitis or vasculitis. Impending rupture is thought less likely though not excluded" Currently denies any abdominal pain lower  extremities pain, distal legs are warm/perfused:I discussed with Dr. Sherral Hammers from vascular, he reviewed his chart and previous CT scan, advised no further inpatient workup at this time and needs outpatient follow-up.  Stable small bilateral pulmonary nodules up to 5 mm in June 2024:  recommendation was for 12 months follow-up if high risk  Abnormal ABI 12/03/2022- non compressible vessels: Pulses palpable bilaterally and warm perfused  legs,no pain or wound  Chronic back pain- c/o back pain from laying flat: Cont pain control  Prediabetes: Recent A1c 5.9, blood sugar stable  Hypothyroidism: Resume home Synthroid  Hypertension: BP uncontrolled resume home amlodipine and lisinopril.  Hyperlipidemia: Continues atorvastatin  Mild normocytic anemia: Hemoglobin stable 11-2 mg  BPH: Flomax  cataracts  There is no height or weight on file to calculate BMI.   Severity of Illness: The appropriate patient status for this patient is INPATIENT. Inpatient status is judged to be reasonable and necessary in order to provide the required intensity of service to ensure the patient's safety. The patient's presenting symptoms, physical exam findings, and initial radiographic and laboratory data in the context of their chronic comorbidities is felt to place them at high risk for further clinical deterioration. Furthermore, it is not anticipated that the patient will be medically stable for discharge from the hospital within 2 midnights of admission.   * I certify that at the point of admission it is my clinical judgment that the patient will require inpatient hospital care spanning beyond 2 midnights from the point of admission due to high intensity of service, high risk for further deterioration and high frequency of surveillance required.*   DVT prophylaxis: heparin injection 5,000 Units Start: 04/17/23 1715 SCDs Start: 04/17/23 1703 Code Status:   Code Status: Full Code  Family Communication:  Admission, patients condition and plan of care including tests being ordered have been discussed with the patient and wife who indicate understanding and agree with the plan and Code Status.  Consults called:  Dr Jena Gauss ortho Dr Karin Lieu from [facility]  Review of Systems: All systems were reviewed and were negative except as mentioned in HPI above. Negative for fever Negative for chest pain Negative for shortness of breath  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/10/2023   Throat cancer [facility])     Past Surgical History:  Procedure Laterality Date   CAROTID STENT     EYE SURGERY     I & D EXTREMITY Left 03/15/2023   Procedure: IRRIGATION AND DEBRIDEMENT LEFT KNEE;  Surgeon: Roby Lofts, MD;  Location: [facility] OR;  Service: Orthopedics;  Laterality: Left;   TONSILLECTOMY       reports that he has quit smoking. His smoking use included cigarettes. He started smoking about 59 years ago. He has a 813.9 pack-year smoking history. He has been exposed to tobacco smoke. He has never used smokeless tobacco. He reports that he does not drink alcohol and does not use drugs.  No Known Allergies  Family History  Problem Relation Age  of Onset   Stroke Mother    Heart attack Mother    Heart disease Brother      Prior to Admission medications   Medication Sig Start Date End Date Taking? Authorizing Provider  acetaminophen (TYLENOL) 500 MG tablet Take 1,000 mg by mouth 2 (two) times daily as needed for mild pain or moderate pain.    [provider]  allopurinol (ZYLOPRIM) 100 MG tablet Take 1 tablet (100 mg total) by mouth daily. 03/05/23   Fuller Plan, MD  amLODipine (NORVASC) 10 MG tablet Take 10 mg by mouth daily.    [provider]  ascorbic acid (VITAMIN C) 500 MG tablet Take 1,000 mg by mouth daily.    [provider]  aspirin EC 325 MG tablet Take 325 mg by mouth daily.    [provider]  atorvastatin (LIPITOR) 40 MG tablet Take 40 mg by mouth every evening.    [provider]  ceFAZolin (ANCEF) IVPB Inject 2 g into the vein every 8 (eight) hours. Indication:  MSSA septic knee First Dose: Yes Last Day of Therapy:  04/28/23 Labs - Once weekly:  CBC/D and BMP, Labs - Once weekly: ESR and CRP Method of administration: IV Push Pull PICC line at the completion of IV antibiotic therapy Method of administration may be changed at the discretion of home infusion pharmacist based upon assessment of the patient and/or caregiver's ability to self-administer the medication ordered. 03/27/23 05/04/23  Joseph Art, DO  docusate sodium (COLACE) 100 MG capsule Take 1 capsule (100 mg total) by mouth 2 (two) times daily. 03/20/23   Lanae Boast, MD  fluticasone (FLONASE) 50 MCG/ACT nasal spray Place 2 sprays into the nose daily as needed for allergies.    [provider]  Iron Combinations (IRON COMPLEX PO) Take 1 tablet by mouth daily.    [provider]  levothyroxine (SYNTHROID) 100 MCG tablet Take 100 mcg by mouth daily.    [provider]  lisinopril (ZESTRIL) 5 MG tablet Take 5 mg by mouth 2 (two) times daily.    [provider]  Multiple Vitamins-Minerals (ONE-A-DAY 50 PLUS PO) Take 1 tablet by mouth daily.    [provider]  Omega-3 Fatty Acids (FISH OIL) 1200 MG CAPS Take 1,200 mg by mouth daily.    [provider]  oxyCODONE-acetaminophen (PERCOCET/ROXICET) 5-325 MG tablet Take 1-2 tablets by mouth every 4 (four) hours as needed for severe pain. 03/27/23   Joseph Art, DO  polyethylene glycol powder (GLYCOLAX/MIRALAX) 17 GM/SCOOP powder Mix as directed and take 1 capful (17 g) by mouth daily as needed for mild constipation. 03/20/23   Lanae Boast, MD  senna-docusate (SENOKOT-S) 8.6-50 MG tablet Take 1 tablet by mouth at bedtime as  needed for mild constipation. 03/20/23   Lanae Boast, MD  tamsulosin (FLOMAX) 0.4 MG CAPS capsule Take 0.4 mg by mouth daily.    [provider]    Physical Exam: Vitals:   04/17/23 1427 04/17/23 1430 04/17/23 1608  BP: (!) 178/86 (!) 164/76 (!) 167/75  Pulse: 89 90 95  Resp: 18  16  Temp: 98 F (36.7 C)    TempSrc: Oral    SpO2: 99% 98% 98%    General exam: AAOX3 , NAD, weak appearing. HEENT:Oral mucosa moist, Ear/Nose WNL grossly, dentition normal. Respiratory system: bilaterally CLEAR ,no wheezing or crackles,no use of accessory muscle Cardiovascular system: S1 & S2 +, No JVD,. Gastrointestinal system: Abdomen soft, NT,ND, BS+ Nervous  System:Alert, awake, moving extremities and grossly nonfocal Extremities: Tender left hip and left knee area, left knee tender surgical scar intact, distal peripheral pulses palpable and arm feet.  Skin: No rashes,no icterus. MSK: Normal muscle bulk,tone, power  Labs on Admission: I have personally reviewed following labs and imaging studies CBC: Recent Labs  Lab 04/17/23 1449  WBC 4.6  NEUTROABS 3.3  HGB 11.3*  HCT 36.0*  MCV 88.0  PLT 243   Basic Metabolic Panel: Recent Labs  Lab 04/17/23 1449  NA 135  K 3.6  CL 100  CO2 26  GLUCOSE 119*  BUN 9  CREATININE 0.63  CALCIUM 8.7*   Recent Labs  Lab 04/17/23 1449  INR 1.1  Urine analysis:    Component Value Date/Time   COLORURINE YELLOW 03/22/2023 2153   APPEARANCEUR HAZY (A) 03/22/2023 2153   LABSPEC 1.013 03/22/2023 2153   PHURINE 7.0 03/22/2023 2153   GLUCOSEU NEGATIVE 03/22/2023 2153   HGBUR NEGATIVE 03/22/2023 2153   BILIRUBINUR NEGATIVE 03/22/2023 2153   KETONESUR 20 (A) 03/22/2023 2153   PROTEINUR NEGATIVE 03/22/2023 2153   UROBILINOGEN 1.0 01/06/2012 1132   NITRITE NEGATIVE 03/22/2023 2153   LEUKOCYTESUR NEGATIVE 03/22/2023 2153    Radiological Exams on Admission: DG Chest Port 1 View  Result Date: 04/17/2023 CLINICAL DATA:  Fall. Left hip  fracture. Preop chest radiograph for left hip surgery. EXAM: PORTABLE CHEST 1 VIEW COMPARISON:  Chest radiograph dated 12/01/2022. FINDINGS: Right-sided PICC with tip at the cavoatrial junction. No focal consolidation, pleural effusion, pneumothorax. The cardiac silhouette is within normal limits. Atherosclerotic calcification of the aorta. No acute osseous pathology. IMPRESSION: No active disease. Electronically Signed   By: Elgie Collard M.D.   On: 04/17/2023 16:10   DG Hip Unilat With Pelvis 2-3 Views Left  Result Date: 04/17/2023 CLINICAL DATA:  Fall with left hip pain EXAM: DG HIP (WITH OR WITHOUT PELVIS) 2-3V LEFT COMPARISON:  None Available. FINDINGS: Displaced femoral neck fracture on the left. No intertrochanteric component is evident. No other pelvic fracture. No significant degenerative change of the hip joints. IMPRESSION: Displaced left femoral neck fracture. Electronically Signed   By: Paulina Fusi M.D.   On: 04/17/2023 16:07      Lanae Boast MD [facility]  If 7PM-7AM, please contact night-coverage [email or web address]  04/17/2023, 5:34 PM

## 2023-04-17 NOTE — Hospital Course (Addendum)
82 year old man PMH including hospitalization for MSSA bacteremia and left knee septic arthritis with discharge 8/21, with plan for IV antibiotics through September 22 followed by Dr. Algis Liming, also history of positive rheumatoid factor, positive ANA, throat cancer, gout who presented 9/11 after a fall resulting in left hip fracture.  Fall occurred in the context of a dizzy spell, has had multiple dizzy spells over the last couple of weeks as well as significant weight loss since developing knee pain.  Consultants Orthopedics ID  Procedures

## 2023-04-17 NOTE — Progress Notes (Signed)
Patient with displaced femoral neck fracture. Will need arthroplasty. Will work to coordinate with total joints colleagues to perform tomorrow vs Friday. Please make NPO after midnight. Formal consult to follow tomorrow.  Roby Lofts, MD [facility] [phone] (office) [email or web address]

## 2023-04-17 NOTE — ED Notes (Signed)
ED TO INPATIENT HANDOFF REPORT  ED Nurse Name and Phone #: Delice Bison, RN   S Name/Age/Gender Dean Mayer 82 y.o. male Room/Bed: WA17/WA17  Code Status   Code Status: Prior  Home/SNF/Other Home Patient oriented to: self, place, time, and situation Is this baseline? Yes   Triage Complete: Triage complete  Chief Complaint Left displaced femoral neck fracture (HCC) [S72.002A]  Triage Note [facility] states pt coming from [facility] for a hip fracture. Pt had a fall this morning but refused to got to hospital so had mobile xray done and found left hip fracture. Pt is at [facility] for rehab for left knee surgery and being septic. Pt has PICC in right arm.   Allergies No Known Allergies  Level of Care/Admitting Diagnosis ED Disposition     ED Disposition  Admit   Condition  --   Comment  Hospital Area: [facility] [[id number]]  Level of Care: Med-Surg [16]  May admit patient to [facility] or [facility] if equivalent level of care is available:: No  Covid Evaluation: Asymptomatic - no recent exposure (last 10 days) testing not required  Diagnosis: Left displaced femoral neck fracture [facility]) [[id number]]  Admitting Physician: Lanae Boast [[id number]]  Attending Physician: Lanae Boast [[id number]]  Certification:: I certify this patient will need inpatient services for at least 2 midnights  Expected Medical Readiness: 04/21/2023          B Medical/Surgery History 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/10/2023   Throat cancer [facility])    Past Surgical History:  Procedure Laterality Date   CAROTID STENT     EYE SURGERY     I & D EXTREMITY Left 03/15/2023   Procedure: IRRIGATION AND DEBRIDEMENT LEFT KNEE;  Surgeon: Roby Lofts, MD;  Location: [facility] OR;  Service: Orthopedics;  Laterality: Left;    TONSILLECTOMY       A IV Location/Drains/Wounds Patient Lines/Drains/Airways Status     Active Line/Drains/Airways     Name Placement date Placement time Site Days   PICC Single Lumen 03/20/23 Right Basilic 45 cm 1 cm 03/20/23  1324  Basilic  28   Wound / Incision (Open or Dehisced) 03/22/23 Other (Comment) Knee Anterior;Left 03/22/23  2000  Knee  26            Intake/Output Last 24 hours No intake or output data in the 24 hours ending 04/17/23 1625  Labs/Imaging Results for orders placed or performed during the hospital encounter of 04/17/23 (from the past 48 hour(s))  Basic metabolic panel     Status: Abnormal   Collection Time: 04/17/23  2:49 PM  Result Value Ref Range   Sodium 135 135 - 145 mmol/L   Potassium 3.6 3.5 - 5.1 mmol/L   Chloride 100 98 - 111 mmol/L   CO2 26 22 - 32 mmol/L   Glucose, Bld 119 (H) 70 - 99 mg/dL    Comment: Glucose reference range applies only to samples taken after fasting for at least 8 hours.   BUN 9 8 - 23 mg/dL   Creatinine, Ser 4.01 0.61 - 1.24 mg/dL   Calcium 8.7 (L) 8.9 - 10.3 mg/dL   GFR, Estimated >02 >72 mL/min    Comment: (NOTE) Calculated using the CKD-EPI Creatinine Equation (2021)  Anion gap 9 5 - 15    Comment: Performed at [facility], [address] [place], [place] [address]  CBC with Differential     Status: Abnormal   Collection Time: 04/17/23  2:49 PM  Result Value Ref Range   WBC 4.6 4.0 - 10.5 K/uL   RBC 4.09 (L) 4.22 - 5.81 MIL/uL   Hemoglobin 11.3 (L) 13.0 - 17.0 g/dL   HCT 60.4 (L) 54.0 - 98.1 %   MCV 88.0 80.0 - 100.0 fL   MCH 27.6 26.0 - 34.0 pg   MCHC 31.4 30.0 - 36.0 g/dL   RDW 19.1 47.8 - 29.5 %   Platelets 243 150 - 400 K/uL   nRBC 0.0 0.0 - 0.2 %   Neutrophils Relative % 72 %   Neutro Abs 3.3 1.7 - 7.7 K/uL   Lymphocytes Relative 20 %   Lymphs Abs 0.9 0.7 - 4.0 K/uL   Monocytes Relative 7 %   Monocytes Absolute 0.3 0.1 - 1.0 K/uL   Eosinophils Relative 1 %   Eosinophils  Absolute 0.0 0.0 - 0.5 K/uL   Basophils Relative 0 %   Basophils Absolute 0.0 0.0 - 0.1 K/uL   Immature Granulocytes 0 %   Abs Immature Granulocytes 0.02 0.00 - 0.07 K/uL    Comment: Performed at [facility], [address], [place], [place] [address]  Protime-INR     Status: None   Collection Time: 04/17/23  2:49 PM  Result Value Ref Range   Prothrombin Time 14.5 11.4 - 15.2 seconds   INR 1.1 0.8 - 1.2    Comment: (NOTE) INR goal varies based on device and disease states. Performed at [facility], [address] [place], [place] [address]   Type and screen [facility]     Status: None (Preliminary result)   Collection Time: 04/17/23  2:49 PM  Result Value Ref Range   ABO/RH(D) PENDING    Antibody Screen PENDING    Sample Expiration      04/20/2023,2359 Performed at [facility], [address] [place], [place] [address]    DG Chest Port 1 View  Result Date: 04/17/2023 CLINICAL DATA:  Fall. Left hip fracture. Preop chest radiograph for left hip surgery. EXAM: PORTABLE CHEST 1 VIEW COMPARISON:  Chest radiograph dated 12/01/2022. FINDINGS: Right-sided PICC with tip at the cavoatrial junction. No focal consolidation, pleural effusion, pneumothorax. The cardiac silhouette is within normal limits. Atherosclerotic calcification of the aorta. No acute osseous pathology. IMPRESSION: No active disease. Electronically Signed   By: Elgie Collard M.D.   On: 04/17/2023 16:10   DG Hip Unilat With Pelvis 2-3 Views Left  Result Date: 04/17/2023 CLINICAL DATA:  Fall with left hip pain EXAM: DG HIP (WITH OR WITHOUT PELVIS) 2-3V LEFT COMPARISON:  None Available. FINDINGS: Displaced femoral neck fracture on the left. No intertrochanteric component is evident. No other pelvic fracture. No significant degenerative change of the hip joints. IMPRESSION: Displaced left femoral neck fracture. Electronically Signed   By: Paulina Fusi M.D.   On: 04/17/2023 16:07    Pending Labs Unresulted Labs (From admission, onward)     Start     Ordered   04/17/23 1620  ABO/Rh  Once,   R        04/17/23 1620            Vitals/Pain Today's Vitals   04/17/23 1424 04/17/23 1427 04/17/23 1430 04/17/23 1608  BP:  Marland Kitchen)  178/86 (!) 164/76 (!) 167/75  Pulse:  89 90 95  Resp:  18  16  Temp:  98 F (36.7 C)    TempSrc:  Oral    SpO2:  99% 98% 98%  PainSc: 10-Worst pain ever       Isolation Precautions No active isolations  Medications Medications  fentaNYL (SUBLIMAZE) injection 50 mcg (50 mcg Intravenous Given 04/17/23 1610)    Mobility walks     Focused Assessments Cardiac Assessment Handoff:    Lab Results  Component Value Date   TROPONINI <0.30 01/06/2012   No results found for: "DDIMER" Does the Patient currently have chest pain? No    R Recommendations: See Admitting Provider Note  Report given to:   Additional Notes:

## 2023-04-17 NOTE — Progress Notes (Signed)
Pharmacy Antibiotic Note  Dean Mayer is a 82 y.o. male admitted on 04/17/2023 with hip pain following a fall at SNF. Pharmacy has been consulted for cefazolin dosing.  Pt is currently getting cefazolin 2 g IV q8h PTA for MSSA bacteremia due to infected native left knee septic arthritis. End date 04/28/23.   Today, 04/17/23 WBC WNL SCr WNL Order updated weight   Last dose of cefazolin given 9/11 @ 0600.   Plan: Continue cefazolin 2 g IV q8h through 04/28/23     Temp (24hrs), Avg:97.9 F (36.6 C), Min:97.8 F (36.6 C), Max:98 F (36.7 C)  Recent Labs  Lab 04/17/23 1449  WBC 4.6  CREATININE 0.63    CrCl cannot be calculated (Unknown ideal weight.).    No Known Allergies  Cindi Carbon, PharmD 04/17/2023 6:16 PM

## 2023-04-17 NOTE — Anesthesia Postprocedure Evaluation (Signed)
Anesthesia Post Note  Patient: Dean Mayer  Procedure(s) Performed: AN AD HOC NERVE BLOCK     Patient location during evaluation: PACU Anesthesia Type: Regional Level of consciousness: awake and alert Pain management: pain level controlled Vital Signs Assessment: post-procedure vital signs reviewed and stable Respiratory status: spontaneous breathing Cardiovascular status: blood pressure returned to baseline Postop Assessment: no headache Anesthetic complications: no   No notable events documented.  Last Vitals:  Vitals:   04/17/23 1743 04/17/23 1840  BP: (!) 182/69 (!) 162/67  Pulse: 84 84  Resp: 20 17  Temp: 36.6 C   SpO2: 97% 93%    Last Pain:  Vitals:   04/17/23 1743  TempSrc:   PainSc: 8                  Mariann Barter

## 2023-04-18 ENCOUNTER — Other Ambulatory Visit: Payer: Self-pay

## 2023-04-18 ENCOUNTER — Inpatient Hospital Stay (HOSPITAL_COMMUNITY): Payer: Medicare HMO

## 2023-04-18 DIAGNOSIS — R55 Syncope and collapse: Secondary | ICD-10-CM

## 2023-04-18 DIAGNOSIS — M00069 Staphylococcal arthritis, unspecified knee: Secondary | ICD-10-CM | POA: Diagnosis not present

## 2023-04-18 DIAGNOSIS — R42 Dizziness and giddiness: Secondary | ICD-10-CM | POA: Diagnosis not present

## 2023-04-18 DIAGNOSIS — S72002A Fracture of unspecified part of neck of left femur, initial encounter for closed fracture: Secondary | ICD-10-CM | POA: Diagnosis not present

## 2023-04-18 LAB — SURGICAL PCR SCREEN
MRSA, PCR: NEGATIVE
Staphylococcus aureus: NEGATIVE

## 2023-04-18 LAB — BASIC METABOLIC PANEL
Anion gap: 10 (ref 5–15)
BUN: 10 mg/dL (ref 8–23)
CO2: 23 mmol/L (ref 22–32)
Calcium: 8.9 mg/dL (ref 8.9–10.3)
Chloride: 98 mmol/L (ref 98–111)
Creatinine, Ser: 0.66 mg/dL (ref 0.61–1.24)
GFR, Estimated: >60 mL/min (ref >60)
Glucose, Bld: 161 mg/dL — ABNORMAL HIGH (ref 70–99)
Potassium: 4 mmol/L (ref 3.5–5.1)
Sodium: 131 mmol/L — ABNORMAL LOW (ref 135–145)

## 2023-04-18 LAB — CBC
HCT: 36 % — ABNORMAL LOW (ref 39.0–52.0)
Hemoglobin: 11.4 g/dL — ABNORMAL LOW (ref 13.0–17.0)
MCH: 27.7 pg (ref 26.0–34.0)
MCHC: 31.7 g/dL (ref 30.0–36.0)
MCV: 87.4 fL (ref 80.0–100.0)
Platelets: 234 10*3/uL (ref 150–400)
RBC: 4.12 MIL/uL — ABNORMAL LOW (ref 4.22–5.81)
RDW: 14 % (ref 11.5–15.5)
WBC: 3.1 10*3/uL — ABNORMAL LOW (ref 4.0–10.5)
nRBC: 0 % (ref 0.0–0.2)

## 2023-04-18 LAB — TYPE AND SCREEN
ABO/RH(D): A POS
Antibody Screen: NEGATIVE

## 2023-04-18 MED ORDER — CEFAZOLIN SODIUM-DEXTROSE 2-4 GM/100ML-% IV SOLN
2.0000 g | INTRAVENOUS | Status: AC
  Administered 2023-04-19: 2 g via INTRAVENOUS
  Filled 2023-04-18: qty 100

## 2023-04-18 MED ORDER — TRANEXAMIC ACID-NACL 1000-0.7 MG/100ML-% IV SOLN
1000.0000 mg | INTRAVENOUS | Status: AC
  Administered 2023-04-19: 1000 mg via INTRAVENOUS
  Filled 2023-04-18: qty 100

## 2023-04-18 MED ORDER — CHLORHEXIDINE GLUCONATE 4 % EX SOLN
60.0000 mL | Freq: Once | CUTANEOUS | Status: AC
  Administered 2023-04-19: 4 via TOPICAL

## 2023-04-18 MED ORDER — POVIDONE-IODINE 10 % EX SWAB
2.0000 | Freq: Once | CUTANEOUS | Status: AC
  Administered 2023-04-19: 2 via TOPICAL

## 2023-04-18 MED ORDER — ENOXAPARIN SODIUM 40 MG/0.4ML IJ SOSY
40.0000 mg | PREFILLED_SYRINGE | INTRAMUSCULAR | Status: DC
  Administered 2023-04-18: 40 mg via SUBCUTANEOUS
  Filled 2023-04-18: qty 0.4

## 2023-04-18 MED ORDER — POVIDONE-IODINE 10 % EX SWAB: 2.0000 | Freq: Once | CUTANEOUS | Status: DC

## 2023-04-18 MED ORDER — ENSURE ENLIVE PO LIQD
237.0000 mL | Freq: Three times a day (TID) | ORAL | Status: DC
  Administered 2023-04-18 – 2023-04-23 (×11): 237 mL via ORAL

## 2023-04-18 MED ORDER — ENSURE PRE-SURGERY PO LIQD
296.0000 mL | ORAL | Status: DC
  Filled 2023-04-18: qty 296

## 2023-04-18 NOTE — Progress Notes (Addendum)
Progress Note   Patient: Dean Mayer:[id number] DOB: 1941/05/13 DOA: 04/17/2023     1 DOS: the patient was seen and examined on 04/18/2023   Brief hospital course: 82 year old man PMH including hospitalization for MSSA bacteremia and left knee septic arthritis with discharge 8/21, with plan for IV antibiotics through September 22 followed by Dr. Algis Liming, also history of positive rheumatoid factor, positive ANA, throat cancer, gout who presented 9/11 after a fall resulting in left hip fracture.  Fall occurred in the context of a dizzy spell, has had multiple dizzy spells over the last couple of weeks as well as significant weight loss since developing knee pain.  Consultants Orthopedics ID  Procedures   Assessment and Plan: Acute left femoral neck fracture Following a fall at a skilled nursing facility in the context of dizziness, presyncope.   Orthopedics will evaluate today, tentative plans for surgery 9/14  Previously active with no history of cardiac disease, recommend proceeding with surgery    Dizzy spells/presyncope Prior to knee pain, was quite active maintaining his yard and garden with no history of cardiac or lung disease.   Dizzy spells have happened over the last 2 weeks and occur in the context of ambulation or change in position and are probably complicated by substantial weight loss since developing knee problems secondary to poor appetite and dislike of institutional food.  No orthostatics were done. 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  Follow-up results.   MSSA bacteremia  MSSA septic arthritis left knee Plan is for 6 weeks of IV Ancef with PICC line until April 28, 2023.  Followed by Dr. Algis Liming Given the patient's need for  operative intervention for left femoral neck fracture in the context of MSSA bacteremia currently on IV antibiotics, have consulted ID to assist   History of throat cancer in 2013 s/p XRT Carotid art stent on rt following radiation associated narrowing abt 3 years ago Due to go to annual visit at [facility] in Aug but got rescheduled for November.   Unintentional weight loss ~40 lb since April 2024: He did have colonoscopy which was normal per patient back in March, does have loss of appetite, also known to have abnormal vascular finding in the abdomen as below.  He is followed from rheumatology for question of vasculitis.  RD consult for wt loss and loss of appetite.  May need further workup as OP. Per wife and patient no issues until he developed knee pain and had to go to rehab.  Probably related to institutional food and infection.   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 Differential considerations include infectious/inflammatory aortitis or vasculitis. Impending rupture is thought less likely though not excluded" Currently denies any abdominal pain lower extremities pain, distal legs are warm/perfused: [Admitting physician] discussed with Dr. Sherral Hammers from vascular, he reviewed his chart and previous CT scan, advised no further inpatient workup at this time and needs outpatient follow-up.   Stable small bilateral pulmonary nodules up to 5 mm in June 2024:  recommendation was for 12 months follow-up if high risk   Abnormal ABI 12/03/2022- non compressible vessels:  Chronic back pain- c/o back pain from laying flat Cont pain control   Prediabetes: Recent A1c 5.9 CBG checks and sliding scale insulin   Hypothyroidism: Continue Synthroid   Essential hypertension Continue  amlodipine and lisinopril.   Hyperlipidemia Continue atorvastatin   Mild normocytic anemia Hemoglobin stable 11-2 mg   BPH: Continue Flomax  Addendum Operative time has been secured at [facility], have discussed with patient placement, plan to transfer to [facility] and remain   Subjective:  Feels ok With doing well at rehab with plan for discharge soon from that facility Has had about 5 episodes of dizziness including 1 episode of syncope a PT Describes prodromal symptoms of visual spots, occurs in the context of ambulation or most recently going from a bending to standing position No cardiac history or lung problems Was very active in his garden and outside maintaining his yard prior to developing knee problems  Physical Exam: Vitals:   04/17/23 1909 04/17/23 2230 04/18/23 0207 04/18/23 0549  BP: 133/71 (!) 160/71 (!) 169/75 (!) 164/74  Pulse: 87 84 94 85  Resp: 16 17 18 18   Temp: 98.2 F (36.8 C) 97.9 F (36.6 C) 98.6 F (37 C) (!) 97.4 F (36.3 C)  TempSrc: Oral     SpO2: 100% 98% 100% 99%  Weight:      Height:       Physical Exam Vitals reviewed.  Constitutional:      General: He is not in acute distress.    Appearance: He is not ill-appearing or toxic-appearing.  Cardiovascular:     Rate and Rhythm: Normal rate and regular rhythm.     Heart sounds: No murmur heard. Pulmonary:     Effort: No respiratory distress.     Breath sounds: No wheezing, rhonchi or rales.  Musculoskeletal:     Right lower leg: No edema.     Left lower leg: No edema.     Comments: Incision left knee appears well-healed There is some swelling of the left knee  Neurological:     Mental Status: He is alert.  Psychiatric:        Mood and Affect: Mood normal.        Behavior: Behavior normal.     Data Reviewed: Na+ 131 Hgb stable 11.4  Family Communication: wife at bedside  Disposition: Status is: Inpatient Remains inpatient appropriate because: hip fx     Time spent: 35  minutes  Author: Brendia Sacks, MD 04/18/2023 10:30 AM  For on call review [email or web address].

## 2023-04-18 NOTE — Progress Notes (Signed)
Carotid duplex bilateral study completed.   Please see CV Proc for preliminary results.   Rachel Hodge, RDMS, RVT  

## 2023-04-18 NOTE — Consult Note (Signed)
Reason for Consult:Left hip fx Referring Physician: Brendia Sacks Time called: 0730 Time at bedside: 1048   Dean Mayer is an 82 y.o. male.  HPI: Dean Mayer was at the SNF where he was completing rehab for a septic knee. He bent over to get something out of a closet and when he stood he became vertiginous and fell. He had immediate left hip pain and could not get up. He was brought to the ED where x-rays showed a hip fx and orthopedic surgery was consulted. He generally lives at home with his wife.  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/10/2023   Throat cancer [facility])     Past Surgical History:  Procedure Laterality Date   CAROTID STENT     EYE SURGERY     I & D EXTREMITY Left 03/15/2023   Procedure: IRRIGATION AND DEBRIDEMENT LEFT KNEE;  Surgeon: Roby Lofts, MD;  Location: [facility] OR;  Service: Orthopedics;  Laterality: Left;   TONSILLECTOMY      Family History  Problem Relation Age of Onset   Stroke Mother    Heart attack Mother    Heart disease Brother     Social History:  reports that he has quit smoking. His smoking use included cigarettes. He started smoking about 59 years ago. He has a 813.9 pack-year smoking history. He has been exposed to tobacco smoke. He has never used smokeless tobacco. He reports that he does not drink alcohol and does not use drugs.  Allergies: No Known Allergies  Medications: I have reviewed the patient's current medications.  Results for orders placed or performed during the hospital encounter of 04/17/23 (from the past 48 hour(s))  Basic metabolic panel     Status: Abnormal   Collection Time: 04/17/23  2:49 PM  Result Value Ref Range   Sodium 135 135 - 145 mmol/L   Potassium 3.6 3.5 - 5.1 mmol/L   Chloride 100 98 - 111 mmol/L   CO2 26 22 - 32 mmol/L   Glucose, Bld 119 (H) 70 -  99 mg/dL    Comment: Glucose reference range applies only to samples taken after fasting for at least 8 hours.   BUN 9 8 - 23 mg/dL   Creatinine, Ser 1.61 0.61 - 1.24 mg/dL   Calcium 8.7 (L) 8.9 - 10.3 mg/dL   GFR, Estimated >09 >60 mL/min    Comment: (NOTE) Calculated using the CKD-EPI Creatinine Equation (2021)    Anion gap 9 5 - 15    Comment: Performed at [facility], [address] [place], [place] [address]  CBC with Differential     Status: Abnormal   Collection Time: 04/17/23  2:49 PM  Result Value Ref Range   WBC 4.6 4.0 - 10.5 K/uL   RBC 4.09 (L) 4.22 - 5.81 MIL/uL   Hemoglobin 11.3 (L) 13.0 - 17.0 g/dL   HCT 81.1 (L) 91.4 - 78.2 %   MCV 88.0 80.0 - 100.0 fL   MCH 27.6 26.0 - 34.0 pg   MCHC 31.4 30.0 - 36.0 g/dL   RDW 95.6 21.3 - 08.6 %   Platelets 243 150 - 400 K/uL   nRBC 0.0 0.0 - 0.2 %   Neutrophils Relative % 72 %   Neutro Abs 3.3 1.7 - 7.7 K/uL  Lymphocytes Relative 20 %   Lymphs Abs 0.9 0.7 - 4.0 K/uL   Monocytes Relative 7 %   Monocytes Absolute 0.3 0.1 - 1.0 K/uL   Eosinophils Relative 1 %   Eosinophils Absolute 0.0 0.0 - 0.5 K/uL   Basophils Relative 0 %   Basophils Absolute 0.0 0.0 - 0.1 K/uL   Immature Granulocytes 0 %   Abs Immature Granulocytes 0.02 0.00 - 0.07 K/uL    Comment: Performed at [facility], [address], [place], [place] [address]  Protime-INR     Status: None   Collection Time: 04/17/23  2:49 PM  Result Value Ref Range   Prothrombin Time 14.5 11.4 - 15.2 seconds   INR 1.1 0.8 - 1.2    Comment: (NOTE) INR goal varies based on device and disease states. Performed at [facility], [address], [place], [place] [address]   Type and screen [facility]     Status: None   Collection Time: 04/17/23  2:49 PM  Result Value Ref Range   ABO/RH(D) A POS    Antibody Screen NEG    Sample Expiration      04/20/2023,2359 Performed at [facility], [address] [place], [place] [address]   ABO/Rh     Status: None   Collection Time: 04/17/23  8:28 PM  Result Value Ref Range   ABO/RH(D)      A POS Performed at [facility], [address], [place], [place] [address]   Basic metabolic panel     Status: Abnormal   Collection Time: 04/18/23  1:50 AM  Result Value Ref Range   Sodium 131 (L) 135 - 145 mmol/L   Potassium 4.0 3.5 - 5.1 mmol/L   Chloride 98 98 - 111 mmol/L   CO2 23 22 - 32 mmol/L   Glucose, Bld 161 (H) 70 - 99 mg/dL    Comment: Glucose reference range applies only to samples taken after fasting for at least 8 hours.   BUN 10 8 - 23 mg/dL   Creatinine, Ser 6.57 0.61 - 1.24 mg/dL   Calcium 8.9 8.9 - 84.6 mg/dL   GFR, Estimated >96 >29 mL/min    Comment: (NOTE) Calculated using the CKD-EPI Creatinine Equation (2021)    Anion gap 10 5 - 15    Comment: Performed at [facility], [address] [place], [place] [address]  CBC     Status: Abnormal   Collection Time: 04/18/23  1:50 AM  Result Value Ref Range   WBC 3.1 (L) 4.0 - 10.5 K/uL   RBC 4.12 (L) 4.22 - 5.81 MIL/uL   Hemoglobin 11.4 (L) 13.0 - 17.0 g/dL   HCT 32.4 (L) 40.1 - 02.7 %   MCV 87.4 80.0 - 100.0 fL   MCH 27.7 26.0 - 34.0 pg   MCHC 31.7 30.0 - 36.0 g/dL   RDW 25.3 66.4 - 40.3 %   Platelets 234 150 - 400 K/uL   nRBC 0.0 0.0 - 0.2 %    Comment: Performed at [facility], [address], [place], [place] [address]    VAS US CAROTID  Result Date: 04/18/2023 Carotid Arterial Duplex Study Patient Name:  Dean Mayer  Date of Exam:   04/18/2023 Medical Rec #: [id number]          Accession #:    [id number] Date of Birth: May 12, 1941  Patient Gender: M Patient Age:   89 years Exam Location:  [facility] Procedure:      VAS US CAROTID Referring Phys: RAMESH KC --------------------------------------------------------------------------------  Indications:       Syncope. Right  ICA/carotid bulb stent placed at outside                    facility 08-28-2021. 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 Risk Factors:      Hypertension, hyperlipidemia, past history of smoking, prior                    CVA. Comparison Study:  03-29-2022 Prior carotid duplex at outside facility with no                    evidence of restenosis of the right ICA/bulb. 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 Performing Technologist: Jean Rosenthal RDMS, RVT  Examination Guidelines: A complete evaluation includes B-mode imaging, spectral Doppler, color Doppler, and power Doppler as needed of all accessible portions of each vessel. Bilateral testing is considered an integral part of a complete examination. Limited examinations for reoccurring indications may be performed as noted.  Right Carotid Findings: +----------+--------+--------+--------+------------------+------------------+           PSV cm/sEDV cm/sStenosisPlaque DescriptionComments           +----------+--------+--------+--------+------------------+------------------+ CCA Prox  206     26                                                   +----------+--------+--------+--------+------------------+------------------+ CCA Distal52      9                                 intimal thickening +----------+--------+--------+--------+------------------+------------------+ ICA Prox                                            Stent              +----------+--------+--------+--------+------------------+------------------+ ICA Distal86      15                                                   +----------+--------+--------+--------+------------------+------------------+ ECA       287     12      >50%    calcific                             +----------+--------+--------+--------+------------------+------------------+  +----------+--------+-------+----------------+-------------------+           PSV cm/sEDV cmsDescribe        Arm Pressure (mmHG) +----------+--------+-------+----------------+-------------------+  Subclavian119            Multiphasic, WNL                    +----------+--------+-------+----------------+-------------------+ +---------+--------+--+--------+-+---------+ VertebralPSV cm/s47EDV cm/s8Antegrade +---------+--------+--+--------+-+---------+  Right Stent(s): +---------------+--------+--------+--------+--------+--------+ ICA/bulb       PSV cm/sEDV cm/sStenosisWaveformComments +---------------+--------+--------+--------+--------+--------+ Prox to Stent  52      11                               +---------------+--------+--------+--------+--------+--------+ Proximal Stent 77      13                               +---------------+--------+--------+--------+--------+--------+ Mid Stent      76      13                               +---------------+--------+--------+--------+--------+--------+ Distal Stent   120     16                               +---------------+--------+--------+--------+--------+--------+ Distal to Stent107     14                               +---------------+--------+--------+--------+--------+--------+   Left Carotid Findings: +----------+--------+--------+--------+------------------+--------+           PSV cm/sEDV cm/sStenosisPlaque DescriptionComments +----------+--------+--------+--------+------------------+--------+ CCA Prox  211     21      >50%    heterogenous               +----------+--------+--------+--------+------------------+--------+ CCA Distal88      15              heterogenous               +----------+--------+--------+--------+------------------+--------+ ICA Prox  122     17      1-39%   calcific                   +----------+--------+--------+--------+------------------+--------+ ICA  Distal144     26                                         +----------+--------+--------+--------+------------------+--------+ ECA       368     27      >50%    calcific                   +----------+--------+--------+--------+------------------+--------+ +----------+--------+--------+----------------+-------------------+           PSV cm/sEDV cm/sDescribe        Arm Pressure (mmHG) +----------+--------+--------+----------------+-------------------+ Subclavian213             Multiphasic, WNL                    +----------+--------+--------+----------------+-------------------+ +---------+--------+--+--------+--+---------+ VertebralPSV cm/s66EDV cm/s15Antegrade +---------+--------+--+--------+--+---------+   Summary: Right Carotid: The ECA appears >50% stenosed. Patent right ICA stent. Left Carotid: Velocities in the left ICA are consistent with a 1-39% stenosis.               Hemodynamically significant plaque >50% visualized in the CCA. The  ECA appears >50% stenosed. Vertebrals:  Bilateral vertebral arteries demonstrate antegrade flow. Subclavians: Normal flow hemodynamics were seen in bilateral subclavian              arteries. *See table(s) above for measurements and observations.     Preliminary    DG Chest Port 1 View  Result Date: 04/17/2023 CLINICAL DATA:  Fall. Left hip fracture. Preop chest radiograph for left hip surgery. EXAM: PORTABLE CHEST 1 VIEW COMPARISON:  Chest radiograph dated 12/01/2022. FINDINGS: Right-sided PICC with tip at the cavoatrial junction. No focal consolidation, pleural effusion, pneumothorax. The cardiac silhouette is within normal limits. Atherosclerotic calcification of the aorta. No acute osseous pathology. IMPRESSION: No active disease. Electronically Signed   By: Elgie Collard M.D.   On: 04/17/2023 16:10   DG Hip Unilat With Pelvis 2-3 Views Left  Result Date: 04/17/2023 CLINICAL DATA:  Fall with left hip pain EXAM: DG HIP  (WITH OR WITHOUT PELVIS) 2-3V LEFT COMPARISON:  None Available. FINDINGS: Displaced femoral neck fracture on the left. No intertrochanteric component is evident. No other pelvic fracture. No significant degenerative change of the hip joints. IMPRESSION: Displaced left femoral neck fracture. Electronically Signed   By: Paulina Fusi M.D.   On: 04/17/2023 16:07    Review of Systems  HENT:  Negative for ear discharge, ear pain, hearing loss and tinnitus.   Eyes:  Negative for photophobia and pain.  Respiratory:  Negative for cough and shortness of breath.   Cardiovascular:  Negative for chest pain.  Gastrointestinal:  Negative for abdominal pain, nausea and vomiting.  Genitourinary:  Negative for dysuria, flank pain, frequency and urgency.  Musculoskeletal:  Positive for arthralgias (Left hip). Negative for back pain, myalgias and neck pain.  Neurological:  Negative for dizziness and headaches.  Hematological:  Does not bruise/bleed easily.  Psychiatric/Behavioral:  The patient is not nervous/anxious.    Blood pressure (!) 164/74, pulse 85, temperature (!) 97.4 F (36.3 C), resp. rate 18, height 6\' 4"  (1.93 m), weight 90.7 kg, SpO2 99%. Physical Exam Constitutional:      General: He is not in acute distress.    Appearance: He is well-developed. He is not diaphoretic.  HENT:     Head: Normocephalic and atraumatic.  Eyes:     General: No scleral icterus.       Right eye: No discharge.        Left eye: No discharge.     Conjunctiva/sclera: Conjunctivae normal.  Cardiovascular:     Rate and Rhythm: Normal rate and regular rhythm.  Pulmonary:     Effort: Pulmonary effort is normal. No respiratory distress.  Musculoskeletal:     Cervical back: Normal range of motion.     Comments: LLE No traumatic wounds, ecchymosis, or rash  Mod TTP hip  No knee or ankle effusion  Knee stable to varus/ valgus and anterior/posterior stress  Sens DPN, SPN, TN intact  Motor EHL, ext, flex, evers 5/5  DP  2+, PT 1+, No significant edema  Skin:    General: Skin is warm and dry.  Neurological:     Mental Status: He is alert.  Psychiatric:        Mood and Affect: Mood normal.        Behavior: Behavior normal.     Assessment/Plan: Left hip fx -- Plan THA, likely Saturday but possibly tomorrow, with Dr. Magnus Ivan.    Freeman Caldron, PA-C Orthopedic Surgery [phone] 04/18/2023, 10:56 AM

## 2023-04-18 NOTE — Progress Notes (Signed)
Initial Nutrition Assessment  DOCUMENTATION CODES:   Severe malnutrition in context of acute illness/injury  INTERVENTION:  Ensure Plus High Protein po BID, each supplement provides 350 kcal and 20 grams of protein.  Chocolate Magic cup daily with meals, each supplement provides 290 kcal and 9 grams of protein  Encourage po intake   Education on adequate nutrition   NUTRITION DIAGNOSIS:   Severe Malnutrition related to acute illness as evidenced by moderate fat depletion, moderate muscle depletion, percent weight loss.  GOAL:   Patient will meet greater than or equal to 90% of their needs  MONITOR:   PO intake, Supplement acceptance, Labs, Weight trends, Skin, I & O's  REASON FOR ASSESSMENT:   Consult Assessment of nutrition requirement/status  ASSESSMENT:    82 y.o. male with PMHx including gout, HTN, HLD, hx of throat cancer, hypothyroidism, rheumatoid factor positive/positive ANA who was recently hospitalized at [facility] for left knee meniscal tear- managed without surgery and MSSA left knee septic arthritis. Patient admitted after falling at rehab, x-ray showed left hip fracture  Visited patient at bedside who was resting with his eyes closed. He reports he ate all of his breakfast this morning because he had not eaten since Tuesday and was very hungry. Breakfast included french toast with syrup, fruit, 2 pieces of bacon and coffee.   Patient reports he has been in and out of the hospital since April and his appetite has not been very good.   Plans for hip replacement on Friday or Saturday at [facility].    Patient reports a typical day of eating consisted of  2 meals per day and a snack: B- cereal, french toast, or pancakes with fruit and coffee Snack- piece of fruit  D- beans and corn or meat, veggie, starch   Patient's wife at bedside reports he was drinking Ensure Plus High Protein daily at rehab and agreeable to drinking more than one per day as long as they arrive  really cold or with a cup of ice.  Patient reports being a picky eater which has also affected his po intake-- dislikes peas, carrots, and eggs   He reports hx of throat cancer and patient reports he can tolerate all textures and does not need any softened foods.   UBW 240#  No N/V/D/C, trouble chewing/swallowing reported.   Labs: CBG 169, Na 131 Meds: lipitor, ancef, lovaza, flomax, synthroid  Wt: 20.7 kg (18%) wt loss x 3 months  04/17/23 90.7 kg  03/24/23 106.9 kg  03/15/23 108 kg  03/11/23 108 kg  02/20/23 108 kg  01/26/23 111.1 kg  01/14/23 111.1 kg  01/04/23 116.1 kg  12/01/22 116.1 kg  11/27/22 116.1 kg   PO: no meals documented at this time   I/O's:  -117 ml (net cumulative)   NUTRITION - FOCUSED PHYSICAL EXAM:  Flowsheet Row Most Recent Value  Orbital Region Moderate depletion  Upper Arm Region Moderate depletion  Thoracic and Lumbar Region Unable to assess  Buccal Region Moderate depletion  Temple Region Moderate depletion  Clavicle Bone Region Moderate depletion  Clavicle and Acromion Bone Region Moderate depletion  Scapular Bone Region Unable to assess  Dorsal Hand Severe depletion  Patellar Region Mild depletion  Anterior Thigh Region Mild depletion  Posterior Calf Region Mild depletion  Edema (RD Assessment) None  Hair Reviewed  Eyes Reviewed  Mouth Reviewed  Skin Reviewed  Nails Reviewed       Diet Order:   Diet Order  Diet regular Fluid consistency: Thin  Diet effective now                   EDUCATION NEEDS:   Education needs have been addressed  Skin:  Skin Assessment: Reviewed RN Assessment  Last BM:  9/10  Height:   Ht Readings from Last 1 Encounters:  04/17/23 6\' 4"  (1.93 m)    Weight:   Wt Readings from Last 1 Encounters:  04/17/23 90.7 kg    Ideal Body Weight:     BMI:  Body mass index is 24.34 kg/m.  Estimated Nutritional Needs:   Kcal:  2300-2700  Protein:  110-140 g  Fluid:  >/=  2L  Leodis Rains, RDN, LDN  Clinical Nutrition

## 2023-04-18 NOTE — Consult Note (Signed)
[facility]       Reason for Consult: septic arthritis    Referring Physician: Dr. Irene Limbo  Principal Problem:   Left displaced femoral neck fracture (HCC) Active Problems:   Hypertension   Hypothyroidism   BPH with obstruction/lower urinary tract symptoms   Septic arthritis of knee (HCC)    allopurinol  100 mg Oral Daily   amLODipine  10 mg Oral Daily   atorvastatin  40 mg Oral QPM   Chlorhexidine Gluconate Cloth  6 each Topical Daily   enoxaparin (LOVENOX) injection  40 mg Subcutaneous Q24H   levothyroxine  112 mcg Oral Q0600   lisinopril  5 mg Oral BID   omega-3 acid ethyl esters  1 g Oral Daily   tamsulosin  0.4 mg Oral Daily    Recommendations: Continue with cefazolin through September 22 TTE to verify no valvular concerns with previous bacteremia  Assessment: He has a native joint septic arthritis near completion of his treatment course.  There is some expected swelling of his left knee but otherwise clinically stable.  From an ID standpoint, I do not feel there is any indication to delay surgery nor to prolong or change his treatment course.  I will though recheck his TTE since he was unable to undergo a TEE in the setting of bacteremia.     HPI: Dean Mayer is a 82 y.o. male with a history of gout, hypertension, history of throat cancer and recent developed a left knee native joint septic arthritis complicated by MSSA bacteremia.  He had noted to have knee swelling about 2 months prior to presentation.  He was started on cefazolin and plan for 6 weeks through September 22.  TTE without vegetation but not able to do TEE due to history of throat cancer.  He unfortunately fell and fractured his hip and needs a total hip arthroplasty.  Plan noted for surgery tomorrow.    Review of Systems:  Constitutional: negative for fevers and chills All other systems reviewed and are negative    Past Medical History:  Diagnosis Date   Eye  abnormalities    GERD (gastroesophageal reflux disease)    Hypertension    Kidney stones    Prostate cancer (HCC)    Septic arthritis of knee (HCC) 04/10/2023   Throat cancer (HCC)     Social History   Tobacco Use   Smoking status: Former    Current packs/day: 20.00    Average packs/day: 13.6 packs/day for 59.7 years (813.9 ttl pk-yrs)    Types: Cigarettes    Start date: 1965    Passive exposure: Past   Smokeless tobacco: Never  Vaping Use   Vaping status: Never Used  Substance Use Topics   Alcohol use: No   Drug use: No    Family History  Problem Relation Age of Onset   Stroke Mother    Heart attack Mother    Heart disease Brother     No Known Allergies  Physical Exam: Constitutional: in no apparent distress  Vitals:   04/18/23 0207 04/18/23 0549  BP: (!) 169/75 (!) 164/74  Pulse: 94 85  Resp: 18 18  Temp: 98.6 F (37 C) (!) 97.4 F (36.3 C)  SpO2: 100% 99%   EYES: anicteric Respiratory: normal respiratory effort Musculoskeletal: no edema  Lab Results  Component Value Date   WBC 3.1 (L) 04/18/2023   HGB 11.4 (L) 04/18/2023   HCT 36.0 (L) 04/18/2023   MCV 87.4  04/18/2023   PLT 234 04/18/2023    Lab Results  Component Value Date   CREATININE 0.66 04/18/2023   BUN 10 04/18/2023   NA 131 (L) 04/18/2023   K 4.0 04/18/2023   CL 98 04/18/2023   CO2 23 04/18/2023    Lab Results  Component Value Date   ALT 8 03/23/2023   AST 22 03/23/2023   ALKPHOS 92 03/23/2023     Microbiology: No results found for this or any previous visit (from the past 240 hour(s)).  Gardiner Barefoot, MD [email or web address] 04/18/2023, 12:34 PM

## 2023-04-18 NOTE — Plan of Care (Signed)
  Problem: Education: Goal: Knowledge of General Education information will improve Description: Including pain rating scale, medication(s)/side effects and non-pharmacologic comfort measures Outcome: Not Progressing   Problem: Health Behavior/Discharge Planning: Goal: Ability to manage health-related needs will improve Outcome: Not Progressing   Problem: Clinical Measurements: Goal: Ability to maintain clinical measurements within normal limits will improve Outcome: Not Progressing Goal: Will remain free from infection Outcome: Not Progressing Goal: Diagnostic test results will improve Outcome: Not Progressing Goal: Respiratory complications will improve Outcome: Not Progressing Goal: Cardiovascular complication will be avoided Outcome: Not Progressing   Problem: Activity: Goal: Risk for activity intolerance will decrease Outcome: Not Progressing   Problem: Nutrition: Goal: Adequate nutrition will be maintained Outcome: Not Progressing   Problem: Coping: Goal: Level of anxiety will decrease Outcome: Not Progressing   Problem: Elimination: Goal: Will not experience complications related to bowel motility Outcome: Not Progressing Goal: Will not experience complications related to urinary retention Outcome: Not Progressing   Problem: Pain Managment: Goal: General experience of comfort will improve Outcome: Not Progressing   Problem: Safety: Goal: Ability to remain free from injury will improve Outcome: Not Progressing   Problem: Skin Integrity: Goal: Risk for impaired skin integrity will decrease Outcome: Not Progressing   Problem: Education: Goal: Verbalization of understanding the information provided (i.e., activity precautions, restrictions, etc) will improve Outcome: Not Progressing Goal: Individualized Educational Video(s) Outcome: Not Progressing   Problem: Activity: Goal: Ability to ambulate and perform ADLs will improve Outcome: Not Progressing    Problem: Clinical Measurements: Goal: Postoperative complications will be avoided or minimized Outcome: Not Progressing   Problem: Self-Concept: Goal: Ability to maintain and perform role responsibilities to the fullest extent possible will improve Outcome: Not Progressing   Problem: Pain Management: Goal: Pain level will decrease Outcome: Not Progressing   

## 2023-04-18 NOTE — Anesthesia Preprocedure Evaluation (Addendum)
Anesthesia Evaluation  Patient identified by MRN, date of birth, ID band Patient awake    Reviewed: Allergy & Precautions, NPO status , Patient's Chart, lab work & pertinent test results  History of Anesthesia Complications Negative for: history of anesthetic complications  Airway Mallampati: III  TM Distance: >3 FB Neck ROM: Full    Dental  (+) Poor Dentition, Dental Advisory Given On the top, the patient only has 3 teeth. Denies any loose teeth.:   Pulmonary neg shortness of breath, neg sleep apnea, neg COPD, neg recent URI, former smoker   Pulmonary exam normal breath sounds clear to auscultation       Cardiovascular hypertension, Pt. on medications (-) angina + Peripheral Vascular Disease  (-) Past MI, (-) Cardiac Stents and (-) CABG  Rhythm:Regular Rate:Normal  HLD, Carotid stenosis  TTE 03/17/2023: IMPRESSIONS     1. Left ventricular ejection fraction, by estimation, is 65 to 70%. The  left ventricle has normal function. The left ventricle has no regional  wall motion abnormalities. There is mild concentric left ventricular  hypertrophy. Left ventricular diastolic  parameters are indeterminate.   2. Right ventricular systolic function is normal. The right ventricular  size is normal. Tricuspid regurgitation signal is inadequate for assessing  PA pressure.   3. The mitral valve is normal in structure. No evidence of mitral valve  regurgitation. No evidence of mitral stenosis.   4. The aortic valve has an indeterminant number of cusps. Aortic valve  regurgitation is not visualized. No aortic stenosis is present.   5. There is mild dilatation of the aortic root, measuring 39 mm.   6. The inferior vena cava is normal in size with greater than 50%  respiratory variability, suggesting right atrial pressure of 3 mmHg.     Neuro/Psych CVA (2017, right-sided weakness), Residual Symptoms    GI/Hepatic ,GERD  Medicated,,   Endo/Other  Hypothyroidism    Renal/GU Renal disease (stones)   H/o prostate cancer    Musculoskeletal  (+) Arthritis ,    Abdominal   Peds  Hematology   Anesthesia Other Findings 82 y.o. male with a history of gout, hypertension, history of throat cancer and recently developed a left knee native joint septic arthritis complicated by MSSA bacteremia.  He had noted to have knee swelling about 2 months prior to presentation.  He was started on cefazolin and plan for 6 weeks through September 22.  TTE without vegetation but not able to do TEE due to history of throat cancer.  He unfortunately fell and fractured his hip and needs a total hip arthroplasty.  Reproductive/Obstetrics                             Anesthesia Physical Anesthesia Plan  ASA: 3  Anesthesia Plan: General   Post-op Pain Management: Tylenol PO (pre-op)*   Induction: Intravenous  PONV Risk Score and Plan: 2 and Ondansetron, Dexamethasone and Treatment may vary due to age or medical condition  Airway Management Planned: Oral ETT  Additional Equipment:   Intra-op Plan:   Post-operative Plan: Extubation in OR  Informed Consent: I have reviewed the patients History and Physical, chart, labs and discussed the procedure including the risks, benefits and alternatives for the proposed anesthesia with the patient or authorized representative who has indicated his/her understanding and acceptance.     Dental advisory given  Plan Discussed with: CRNA and Anesthesiologist  Anesthesia Plan Comments: (Risks of general anesthesia discussed  including, but not limited to, sore throat, hoarse voice, chipped/damaged teeth, injury to vocal cords, nausea and vomiting, allergic reactions, lung infection, heart attack, stroke, and death. All questions answered. )        Anesthesia Quick Evaluation

## 2023-04-18 NOTE — Progress Notes (Signed)
Asked to help with patient regarding new left femoral neck fracture. Patient needs L THA for pain control and immediate mobilization OOB. Due to OR availability, surgery will be at [facility] tomorrow am. Will discuss [facility] with [facility]. NPO after MN tonight. Hold chemical DVT ppx. Preop orders placed.

## 2023-04-18 NOTE — TOC Initial Note (Signed)
Transition of Care [facility]) - Initial/Assessment Note   Patient Details  Name: Dean Mayer MRN: [id number] Date of Birth: 31-Mar-1941  Transition of Care [facility]) CM/SW Contact:    Ewing Schlein, LCSW Phone Number: 04/18/2023, 10:10 AM  Clinical Narrative: Patient admitted from [facility]. TOC following for discharge needs.  Expected Discharge Plan: Skilled Nursing Facility Barriers to Discharge: Continued Medical Work up, English as a second language teacher  Patient Goals and CMS Choice CMS Medicare.gov Compare Post Acute Care list provided to:: Patient Choice offered to / list presented to : Patient  Expected Discharge Plan and Services In-house Referral: Clinical Social Work Post Acute Care Choice: Skilled Nursing Facility Living arrangements for the past 2 months: Single Family Home            DME Arranged: N/A DME Agency: NA  Prior Living Arrangements/Services Living arrangements for the past 2 months: Single Family Home Lives with:: Spouse Patient language and need for interpreter reviewed:: Yes Need for Family Participation in Patient Care: No (Comment) Care giver support system in place?: Yes (comment) Criminal Activity/Legal Involvement Pertinent to Current Situation/Hospitalization: No - Comment as needed  Activities of Daily Living Home Assistive Devices/Equipment: Environmental consultant (specify type) ADL Screening (condition at time of admission) Patient's cognitive ability adequate to safely complete daily activities?: Yes Is the patient deaf or have difficulty hearing?: No Does the patient have difficulty seeing, even when wearing glasses/contacts?: No Does the patient have difficulty concentrating, remembering, or making decisions?: No Patient able to express need for assistance with ADLs?: Yes Does the patient have difficulty dressing or bathing?: Yes Independently performs ADLs?: Yes (appropriate for developmental age) Does the patient have difficulty walking or climbing stairs?:  Yes Weakness of Legs: Left Weakness of Arms/Hands: None  Emotional Assessment Orientation: : Oriented to Self, Oriented to Place, Oriented to  Time, Oriented to Situation Alcohol / Substance Use: Not Applicable Psych Involvement: No (comment)  Admission diagnosis:  Dizziness [R42] Left displaced femoral neck fracture (HCC) [S72.002A] Fall, initial encounter [W19.XXXA] Closed left hip fracture, initial encounter [facility]) [S72.002A] Patient Active Problem List   Diagnosis Date Noted   Left displaced femoral neck fracture (HCC) 04/17/2023   Septic arthritis of knee (HCC) 04/10/2023   Painful knee 03/21/2023   MSSA bacteremia 03/18/2023   Acute gout of right ankle 03/18/2023   Bacterial infection of knee joint (HCC) 03/15/2023   Swelling of right foot 02/20/2023   Sedimentation rate elevation 02/20/2023   Positive ANA (antinuclear antibody) 02/20/2023   Peripheral arterial disease (HCC) 12/04/2022   Cellulitis of right lower extremity 12/03/2022   Peripheral edema 12/02/2022   Cellulitis of right leg 12/01/2022   Hypertension 12/01/2022   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   Dyspnea on exertion 12/01/2022   Hypothyroidism 12/01/2022   Carotid stenosis 08/29/2021   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   Malignant neoplasm of prostate (HCC) 05/28/2018   Primary open angle glaucoma (POAG) of right eye, severe stage 09/16/2017   Lacunar infarct, acute (HCC) 01/05/2016   Confusion 01/04/2016   Hypertensive urgency 01/04/2016   Left-sided weakness 01/04/2016   Stroke (HCC) 01/04/2016   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 [place], [place] - [phone] [place] [address] Phone: [phone] Fax: [phone]  [facility] - [place], [place] - [address] [place] [address] Phone: [phone] Fax: [phone]  Social Determinants of Health (SDOH) Social History: SDOH Screenings   Food Insecurity: No Food Insecurity (04/17/2023)  Housing: Low Risk  (04/17/2023)  Transportation Needs: No Transportation Needs (04/17/2023)  Utilities: Not At Risk (04/17/2023)  Financial Resource Strain: Low Risk  (11/19/2021)   Received from [facility], [facility]  Physical Activity: Inactive (11/19/2021)   Received from [facility], [facility]  Social Connections: Unknown (11/26/2022)   Received from [facility], [facility]  Stress: No Stress Concern Present (03/16/2022)   Received from [facility], [facility]  Tobacco Use: Medium Risk (04/17/2023)   SDOH Interventions:    Readmission Risk Interventions    04/18/2023    9:55 AM  Readmission Risk Prevention Plan  Transportation Screening Complete  Medication Review (RN Care Manager) Complete  HRI or Home Care Consult Complete  SW Recovery Care/Counseling Consult Complete  Palliative Care Screening Not Applicable  Skilled Nursing Facility Not Complete  SNF Comments Awaiting PT evaluation following surgery.

## 2023-04-18 NOTE — H&P (View-Only) (Signed)
Reason for Consult:Left hip fx Referring Physician: Brendia Sacks Time called: 0730 Time at bedside: 1048   Dean Mayer is an 82 y.o. male.  HPI: Dean Mayer was at the SNF where he was completing rehab for a septic knee. He bent over to get something out of a closet and when he stood he became vertiginous and fell. He had immediate left hip pain and could not get up. He was brought to the ED where x-rays showed a hip fx and orthopedic surgery was consulted. He generally lives at home with his wife.  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/10/2023   Throat cancer [facility])     Past Surgical History:  Procedure Laterality Date   CAROTID STENT     EYE SURGERY     I & D EXTREMITY Left 03/15/2023   Procedure: IRRIGATION AND DEBRIDEMENT LEFT KNEE;  Surgeon: Roby Lofts, MD;  Location: [facility] OR;  Service: Orthopedics;  Laterality: Left;   TONSILLECTOMY      Family History  Problem Relation Age of Onset   Stroke Mother    Heart attack Mother    Heart disease Brother     Social History:  reports that he has quit smoking. His smoking use included cigarettes. He started smoking about 59 years ago. He has a 813.9 pack-year smoking history. He has been exposed to tobacco smoke. He has never used smokeless tobacco. He reports that he does not drink alcohol and does not use drugs.  Allergies: No Known Allergies  Medications: I have reviewed the patient's current medications.  Results for orders placed or performed during the hospital encounter of 04/17/23 (from the past 48 hour(s))  Basic metabolic panel     Status: Abnormal   Collection Time: 04/17/23  2:49 PM  Result Value Ref Range   Sodium 135 135 - 145 mmol/L   Potassium 3.6 3.5 - 5.1 mmol/L   Chloride 100 98 - 111 mmol/L   CO2 26 22 - 32 mmol/L   Glucose, Bld 119 (H) 70 -  99 mg/dL    Comment: Glucose reference range applies only to samples taken after fasting for at least 8 hours.   BUN 9 8 - 23 mg/dL   Creatinine, Ser 8.46 0.61 - 1.24 mg/dL   Calcium 8.7 (L) 8.9 - 10.3 mg/dL   GFR, Estimated >96 >29 mL/min    Comment: (NOTE) Calculated using the CKD-EPI Creatinine Equation (2021)    Anion gap 9 5 - 15    Comment: Performed at [facility], [address] [place], [place] [address]  CBC with Differential     Status: Abnormal   Collection Time: 04/17/23  2:49 PM  Result Value Ref Range   WBC 4.6 4.0 - 10.5 K/uL   RBC 4.09 (L) 4.22 - 5.81 MIL/uL   Hemoglobin 11.3 (L) 13.0 - 17.0 g/dL   HCT 32.4 (L) 40.1 - 02.7 %   MCV 88.0 80.0 - 100.0 fL   MCH 27.6 26.0 - 34.0 pg   MCHC 31.4 30.0 - 36.0 g/dL   RDW 25.3 66.4 - 40.3 %   Platelets 243 150 - 400 K/uL   nRBC 0.0 0.0 - 0.2 %   Neutrophils Relative % 72 %   Neutro Abs 3.3 1.7 - 7.7 K/uL  Lymphocytes Relative 20 %   Lymphs Abs 0.9 0.7 - 4.0 K/uL   Monocytes Relative 7 %   Monocytes Absolute 0.3 0.1 - 1.0 K/uL   Eosinophils Relative 1 %   Eosinophils Absolute 0.0 0.0 - 0.5 K/uL   Basophils Relative 0 %   Basophils Absolute 0.0 0.0 - 0.1 K/uL   Immature Granulocytes 0 %   Abs Immature Granulocytes 0.02 0.00 - 0.07 K/uL    Comment: Performed at [facility], [address], [place], [place] [address]  Protime-INR     Status: None   Collection Time: 04/17/23  2:49 PM  Result Value Ref Range   Prothrombin Time 14.5 11.4 - 15.2 seconds   INR 1.1 0.8 - 1.2    Comment: (NOTE) INR goal varies based on device and disease states. Performed at [facility], [address], [place], [place] [address]   Type and screen [facility]     Status: None   Collection Time: 04/17/23  2:49 PM  Result Value Ref Range   ABO/RH(D) A POS    Antibody Screen NEG    Sample Expiration      04/20/2023,2359 Performed at [facility], [address], [place], [place] [address]   ABO/Rh     Status: None   Collection Time: 04/17/23  8:28 PM  Result Value Ref Range   ABO/RH(D)      A POS Performed at [facility], [address] [place], [place] [address]   Basic metabolic panel     Status: Abnormal   Collection Time: 04/18/23  1:50 AM  Result Value Ref Range   Sodium 131 (L) 135 - 145 mmol/L   Potassium 4.0 3.5 - 5.1 mmol/L   Chloride 98 98 - 111 mmol/L   CO2 23 22 - 32 mmol/L   Glucose, Bld 161 (H) 70 - 99 mg/dL    Comment: Glucose reference range applies only to samples taken after fasting for at least 8 hours.   BUN 10 8 - 23 mg/dL   Creatinine, Ser 2.95 0.61 - 1.24 mg/dL   Calcium 8.9 8.9 - 62.1 mg/dL   GFR, Estimated >30 >86 mL/min    Comment: (NOTE) Calculated using the CKD-EPI Creatinine Equation (2021)    Anion gap 10 5 - 15    Comment: Performed at [facility], [address] [place], [place] [address]  CBC     Status: Abnormal   Collection Time: 04/18/23  1:50 AM  Result Value Ref Range   WBC 3.1 (L) 4.0 - 10.5 K/uL   RBC 4.12 (L) 4.22 - 5.81 MIL/uL   Hemoglobin 11.4 (L) 13.0 - 17.0 g/dL   HCT 96.2 (L) 95.2 - 84.1 %   MCV 87.4 80.0 - 100.0 fL   MCH 27.7 26.0 - 34.0 pg   MCHC 31.7 30.0 - 36.0 g/dL   RDW 32.4 40.1 - 02.7 %   Platelets 234 150 - 400 K/uL   nRBC 0.0 0.0 - 0.2 %    Comment: Performed at [facility], [address], [place], [place] [address]    VAS US CAROTID  Result Date: 04/18/2023 Carotid Arterial Duplex Study Patient Name:  Dean Mayer  Date of Exam:   04/18/2023 Medical Rec #: [id number]          Accession #:    [id number] Date of Birth: 1940-09-14  Patient Gender: M Patient Age:   89 years Exam Location:  [facility] Procedure:      VAS US CAROTID Referring Phys: RAMESH KC --------------------------------------------------------------------------------  Indications:       Syncope. Right  ICA/carotid bulb stent placed at outside                    facility 08-28-2021. 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 Risk Factors:      Hypertension, hyperlipidemia, past history of smoking, prior                    CVA. Comparison Study:  03-29-2022 Prior carotid duplex at outside facility with no                    evidence of restenosis of the right ICA/bulb. 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 Performing Technologist: Jean Rosenthal RDMS, RVT  Examination Guidelines: A complete evaluation includes B-mode imaging, spectral Doppler, color Doppler, and power Doppler as needed of all accessible portions of each vessel. Bilateral testing is considered an integral part of a complete examination. Limited examinations for reoccurring indications may be performed as noted.  Right Carotid Findings: +----------+--------+--------+--------+------------------+------------------+           PSV cm/sEDV cm/sStenosisPlaque DescriptionComments           +----------+--------+--------+--------+------------------+------------------+ CCA Prox  206     26                                                   +----------+--------+--------+--------+------------------+------------------+ CCA Distal52      9                                 intimal thickening +----------+--------+--------+--------+------------------+------------------+ ICA Prox                                            Stent              +----------+--------+--------+--------+------------------+------------------+ ICA Distal86      15                                                   +----------+--------+--------+--------+------------------+------------------+ ECA       287     12      >50%    calcific                             +----------+--------+--------+--------+------------------+------------------+  +----------+--------+-------+----------------+-------------------+           PSV cm/sEDV cmsDescribe        Arm Pressure (mmHG) +----------+--------+-------+----------------+-------------------+  Subclavian119            Multiphasic, WNL                    +----------+--------+-------+----------------+-------------------+ +---------+--------+--+--------+-+---------+ VertebralPSV cm/s47EDV cm/s8Antegrade +---------+--------+--+--------+-+---------+  Right Stent(s): +---------------+--------+--------+--------+--------+--------+ ICA/bulb       PSV cm/sEDV cm/sStenosisWaveformComments +---------------+--------+--------+--------+--------+--------+ Prox to Stent  52      11                               +---------------+--------+--------+--------+--------+--------+ Proximal Stent 77      13                               +---------------+--------+--------+--------+--------+--------+ Mid Stent      76      13                               +---------------+--------+--------+--------+--------+--------+ Distal Stent   120     16                               +---------------+--------+--------+--------+--------+--------+ Distal to Stent107     14                               +---------------+--------+--------+--------+--------+--------+   Left Carotid Findings: +----------+--------+--------+--------+------------------+--------+           PSV cm/sEDV cm/sStenosisPlaque DescriptionComments +----------+--------+--------+--------+------------------+--------+ CCA Prox  211     21      >50%    heterogenous               +----------+--------+--------+--------+------------------+--------+ CCA Distal88      15              heterogenous               +----------+--------+--------+--------+------------------+--------+ ICA Prox  122     17      1-39%   calcific                   +----------+--------+--------+--------+------------------+--------+ ICA  Distal144     26                                         +----------+--------+--------+--------+------------------+--------+ ECA       368     27      >50%    calcific                   +----------+--------+--------+--------+------------------+--------+ +----------+--------+--------+----------------+-------------------+           PSV cm/sEDV cm/sDescribe        Arm Pressure (mmHG) +----------+--------+--------+----------------+-------------------+ Subclavian213             Multiphasic, WNL                    +----------+--------+--------+----------------+-------------------+ +---------+--------+--+--------+--+---------+ VertebralPSV cm/s66EDV cm/s15Antegrade +---------+--------+--+--------+--+---------+   Summary: Right Carotid: The ECA appears >50% stenosed. Patent right ICA stent. Left Carotid: Velocities in the left ICA are consistent with a 1-39% stenosis.               Hemodynamically significant plaque >50% visualized in the CCA. The  ECA appears >50% stenosed. Vertebrals:  Bilateral vertebral arteries demonstrate antegrade flow. Subclavians: Normal flow hemodynamics were seen in bilateral subclavian              arteries. *See table(s) above for measurements and observations.     Preliminary    DG Chest Port 1 View  Result Date: 04/17/2023 CLINICAL DATA:  Fall. Left hip fracture. Preop chest radiograph for left hip surgery. EXAM: PORTABLE CHEST 1 VIEW COMPARISON:  Chest radiograph dated 12/01/2022. FINDINGS: Right-sided PICC with tip at the cavoatrial junction. No focal consolidation, pleural effusion, pneumothorax. The cardiac silhouette is within normal limits. Atherosclerotic calcification of the aorta. No acute osseous pathology. IMPRESSION: No active disease. Electronically Signed   By: Elgie Collard M.D.   On: 04/17/2023 16:10   DG Hip Unilat With Pelvis 2-3 Views Left  Result Date: 04/17/2023 CLINICAL DATA:  Fall with left hip pain EXAM: DG HIP  (WITH OR WITHOUT PELVIS) 2-3V LEFT COMPARISON:  None Available. FINDINGS: Displaced femoral neck fracture on the left. No intertrochanteric component is evident. No other pelvic fracture. No significant degenerative change of the hip joints. IMPRESSION: Displaced left femoral neck fracture. Electronically Signed   By: Paulina Fusi M.D.   On: 04/17/2023 16:07    Review of Systems  HENT:  Negative for ear discharge, ear pain, hearing loss and tinnitus.   Eyes:  Negative for photophobia and pain.  Respiratory:  Negative for cough and shortness of breath.   Cardiovascular:  Negative for chest pain.  Gastrointestinal:  Negative for abdominal pain, nausea and vomiting.  Genitourinary:  Negative for dysuria, flank pain, frequency and urgency.  Musculoskeletal:  Positive for arthralgias (Left hip). Negative for back pain, myalgias and neck pain.  Neurological:  Negative for dizziness and headaches.  Hematological:  Does not bruise/bleed easily.  Psychiatric/Behavioral:  The patient is not nervous/anxious.    Blood pressure (!) 164/74, pulse 85, temperature (!) 97.4 F (36.3 C), resp. rate 18, height 6\' 4"  (1.93 m), weight 90.7 kg, SpO2 99%. Physical Exam Constitutional:      General: He is not in acute distress.    Appearance: He is well-developed. He is not diaphoretic.  HENT:     Head: Normocephalic and atraumatic.  Eyes:     General: No scleral icterus.       Right eye: No discharge.        Left eye: No discharge.     Conjunctiva/sclera: Conjunctivae normal.  Cardiovascular:     Rate and Rhythm: Normal rate and regular rhythm.  Pulmonary:     Effort: Pulmonary effort is normal. No respiratory distress.  Musculoskeletal:     Cervical back: Normal range of motion.     Comments: LLE No traumatic wounds, ecchymosis, or rash  Mod TTP hip  No knee or ankle effusion  Knee stable to varus/ valgus and anterior/posterior stress  Sens DPN, SPN, TN intact  Motor EHL, ext, flex, evers 5/5  DP  2+, PT 1+, No significant edema  Skin:    General: Skin is warm and dry.  Neurological:     Mental Status: He is alert.  Psychiatric:        Mood and Affect: Mood normal.        Behavior: Behavior normal.     Assessment/Plan: Left hip fx -- Plan THA, likely Saturday but possibly tomorrow, with Dr. Magnus Ivan.    Freeman Caldron, PA-C Orthopedic Surgery [phone] 04/18/2023, 10:56 AM

## 2023-04-18 NOTE — Progress Notes (Signed)
Brief Pharmacy Note  Pharmacy consulted to dose Lovenox for VTE prophylaxis. Note tentative plan for surgery for acute femoral neck fracture in near future. Order for Lovenox 40 mg subcu daily.  Please hold/discontinue Lovenox as necessary prior to procedure once plan is known.  Pricilla Riffle, PharmD, BCPS Clinical Pharmacist 04/18/2023 10:41 AM

## 2023-04-19 ENCOUNTER — Inpatient Hospital Stay (HOSPITAL_COMMUNITY): Payer: Medicare HMO | Admitting: Anesthesiology

## 2023-04-19 ENCOUNTER — Encounter (HOSPITAL_COMMUNITY)
Admission: EM | Disposition: A | Discharge status: Skilled Nursing Facility | Payer: Self-pay | Source: Skilled Nursing Facility | Attending: Internal Medicine

## 2023-04-19 ENCOUNTER — Encounter (HOSPITAL_COMMUNITY): Payer: Self-pay | Admitting: Internal Medicine

## 2023-04-19 ENCOUNTER — Inpatient Hospital Stay (HOSPITAL_COMMUNITY): Payer: Medicare HMO

## 2023-04-19 ENCOUNTER — Other Ambulatory Visit: Payer: Self-pay

## 2023-04-19 ENCOUNTER — Other Ambulatory Visit (HOSPITAL_COMMUNITY): Payer: Medicare HMO

## 2023-04-19 DIAGNOSIS — S72002A Fracture of unspecified part of neck of left femur, initial encounter for closed fracture: Secondary | ICD-10-CM | POA: Diagnosis not present

## 2023-04-19 DIAGNOSIS — E039 Hypothyroidism, unspecified: Secondary | ICD-10-CM

## 2023-04-19 DIAGNOSIS — I10 Essential (primary) hypertension: Secondary | ICD-10-CM

## 2023-04-19 DIAGNOSIS — Z87891 Personal history of nicotine dependence: Secondary | ICD-10-CM

## 2023-04-19 DIAGNOSIS — S72042A Displaced fracture of base of neck of left femur, initial encounter for closed fracture: Secondary | ICD-10-CM | POA: Diagnosis not present

## 2023-04-19 DIAGNOSIS — E43 Unspecified severe protein-calorie malnutrition: Secondary | ICD-10-CM | POA: Insufficient documentation

## 2023-04-19 HISTORY — PX: TOTAL HIP ARTHROPLASTY: SHX124

## 2023-04-19 SURGERY — ARTHROPLASTY, HIP, TOTAL, ANTERIOR APPROACH
Anesthesia: General | Site: Hip | Laterality: Left

## 2023-04-19 MED ORDER — ACETAMINOPHEN 325 MG PO TABS: 325.0000 mg | ORAL_TABLET | Freq: Four times a day (QID) | ORAL | Status: DC | PRN

## 2023-04-19 MED ORDER — ORAL CARE MOUTH RINSE: 15.0000 mL | Freq: Once | OROMUCOSAL | Status: AC

## 2023-04-19 MED ORDER — CHLORHEXIDINE GLUCONATE 0.12 % MT SOLN: 15.0000 mL | Freq: Once | OROMUCOSAL | Status: AC

## 2023-04-19 MED ORDER — SODIUM CHLORIDE (PF) 0.9 % IJ SOLN
INTRAMUSCULAR | Status: DC | PRN
  Administered 2023-04-19: 30 mL

## 2023-04-19 MED ORDER — KETOROLAC TROMETHAMINE 30 MG/ML IJ SOLN
INTRAMUSCULAR | Status: AC
  Filled 2023-04-19: qty 1

## 2023-04-19 MED ORDER — ROCURONIUM BROMIDE 10 MG/ML (PF) SYRINGE
PREFILLED_SYRINGE | INTRAVENOUS | Status: AC
  Filled 2023-04-19: qty 10

## 2023-04-19 MED ORDER — AMISULPRIDE (ANTIEMETIC) 5 MG/2ML IV SOLN: 10.0000 mg | Freq: Once | INTRAVENOUS | Status: DC | PRN

## 2023-04-19 MED ORDER — BUPIVACAINE-EPINEPHRINE (PF) 0.5% -1:200000 IJ SOLN
INTRAMUSCULAR | Status: DC | PRN
  Administered 2023-04-19: 30 mL via PERINEURAL

## 2023-04-19 MED ORDER — METOCLOPRAMIDE HCL 5 MG PO TABS: 5.0000 mg | ORAL_TABLET | Freq: Three times a day (TID) | ORAL | Status: DC | PRN

## 2023-04-19 MED ORDER — POLYETHYLENE GLYCOL 3350 17 G PO PACK
17.0000 g | PACK | Freq: Every day | ORAL | Status: DC | PRN
  Administered 2023-04-23: 17 g via ORAL
  Filled 2023-04-19: qty 1

## 2023-04-19 MED ORDER — MORPHINE SULFATE (PF) 2 MG/ML IV SOLN: 0.5000 mg | INTRAVENOUS | Status: DC | PRN

## 2023-04-19 MED ORDER — OXYCODONE HCL 5 MG PO TABS
ORAL_TABLET | ORAL | Status: AC
  Filled 2023-04-19: qty 1

## 2023-04-19 MED ORDER — PROPOFOL 500 MG/50ML IV EMUL
INTRAVENOUS | Status: DC | PRN
Start: 2023-04-19 — End: 2023-04-19
  Administered 2023-04-19: 100 ug/kg/min via INTRAVENOUS

## 2023-04-19 MED ORDER — 0.9 % SODIUM CHLORIDE (POUR BTL) OPTIME
TOPICAL | Status: DC | PRN
  Administered 2023-04-19: 1000 mL

## 2023-04-19 MED ORDER — SODIUM CHLORIDE 0.9 % IR SOLN
Status: DC | PRN
  Administered 2023-04-19: 1000 mL

## 2023-04-19 MED ORDER — HYDROCODONE-ACETAMINOPHEN 5-325 MG PO TABS: 1.0000 | ORAL_TABLET | ORAL | Status: DC | PRN

## 2023-04-19 MED ORDER — LIDOCAINE 2% (20 MG/ML) 5 ML SYRINGE
INTRAMUSCULAR | Status: AC
  Filled 2023-04-19: qty 5

## 2023-04-19 MED ORDER — DIPHENHYDRAMINE HCL 12.5 MG/5ML PO ELIX: 12.5000 mg | ORAL_SOLUTION | ORAL | Status: DC | PRN

## 2023-04-19 MED ORDER — METHOCARBAMOL 500 MG PO TABS
500.0000 mg | ORAL_TABLET | Freq: Three times a day (TID) | ORAL | Status: DC | PRN
  Administered 2023-04-19 – 2023-04-25 (×5): 500 mg via ORAL
  Filled 2023-04-19 (×5): qty 1

## 2023-04-19 MED ORDER — ROCURONIUM BROMIDE 10 MG/ML (PF) SYRINGE
PREFILLED_SYRINGE | INTRAVENOUS | Status: DC | PRN
  Administered 2023-04-19: 20 mg via INTRAVENOUS
  Administered 2023-04-19: 60 mg via INTRAVENOUS

## 2023-04-19 MED ORDER — DEXAMETHASONE SODIUM PHOSPHATE 10 MG/ML IJ SOLN
INTRAMUSCULAR | Status: DC | PRN
  Administered 2023-04-19: 5 mg via INTRAVENOUS

## 2023-04-19 MED ORDER — PROPOFOL 10 MG/ML IV BOLUS
INTRAVENOUS | Status: DC | PRN
  Administered 2023-04-19: 100 mg via INTRAVENOUS

## 2023-04-19 MED ORDER — SODIUM CHLORIDE 0.9 % IR SOLN
Status: DC | PRN
Start: 2023-04-19 — End: 2023-04-19
  Administered 2023-04-19: 3000 mL

## 2023-04-19 MED ORDER — OXYCODONE HCL 5 MG PO TABS
5.0000 mg | ORAL_TABLET | Freq: Once | ORAL | Status: AC | PRN
  Administered 2023-04-19: 5 mg via ORAL

## 2023-04-19 MED ORDER — ONDANSETRON HCL 4 MG PO TABS: 4.0000 mg | ORAL_TABLET | Freq: Four times a day (QID) | ORAL | Status: DC | PRN

## 2023-04-19 MED ORDER — CHLORHEXIDINE GLUCONATE 0.12 % MT SOLN
OROMUCOSAL | Status: AC
  Administered 2023-04-19: 15 mL via OROMUCOSAL
  Filled 2023-04-19: qty 15

## 2023-04-19 MED ORDER — ALUM & MAG HYDROXIDE-SIMETH 200-200-20 MG/5ML PO SUSP: 30.0000 mL | ORAL | Status: DC | PRN

## 2023-04-19 MED ORDER — METOCLOPRAMIDE HCL 5 MG/ML IJ SOLN: 5.0000 mg | Freq: Three times a day (TID) | INTRAMUSCULAR | Status: DC | PRN

## 2023-04-19 MED ORDER — DOCUSATE SODIUM 100 MG PO CAPS
100.0000 mg | ORAL_CAPSULE | Freq: Two times a day (BID) | ORAL | Status: DC
  Administered 2023-04-19 – 2023-04-26 (×15): 100 mg via ORAL
  Filled 2023-04-19 (×14): qty 1

## 2023-04-19 MED ORDER — SODIUM CHLORIDE 0.9 % IV SOLN: INTRAVENOUS | Status: DC

## 2023-04-19 MED ORDER — KETOROLAC TROMETHAMINE 30 MG/ML IJ SOLN
INTRAMUSCULAR | Status: DC | PRN
  Administered 2023-04-19: 30 mg

## 2023-04-19 MED ORDER — DEXAMETHASONE SODIUM PHOSPHATE 10 MG/ML IJ SOLN
INTRAMUSCULAR | Status: AC
  Filled 2023-04-19: qty 1

## 2023-04-19 MED ORDER — FENTANYL CITRATE (PF) 100 MCG/2ML IJ SOLN: 25.0000 ug | INTRAMUSCULAR | Status: DC | PRN

## 2023-04-19 MED ORDER — PHENOL 1.4 % MT LIQD: 1.0000 | OROMUCOSAL | Status: DC | PRN

## 2023-04-19 MED ORDER — OXYCODONE HCL 5 MG/5ML PO SOLN: 5.0000 mg | Freq: Once | ORAL | Status: AC | PRN

## 2023-04-19 MED ORDER — LIDOCAINE 2% (20 MG/ML) 5 ML SYRINGE
INTRAMUSCULAR | Status: DC | PRN
  Administered 2023-04-19: 100 mg via INTRAVENOUS

## 2023-04-19 MED ORDER — PHENYLEPHRINE HCL-NACL 20-0.9 MG/250ML-% IV SOLN
INTRAVENOUS | Status: DC | PRN
  Administered 2023-04-19: 50 ug/min via INTRAVENOUS

## 2023-04-19 MED ORDER — ASPIRIN 81 MG PO CHEW
81.0000 mg | CHEWABLE_TABLET | Freq: Two times a day (BID) | ORAL | Status: DC
  Administered 2023-04-19 – 2023-04-26 (×14): 81 mg via ORAL
  Filled 2023-04-19 (×14): qty 1

## 2023-04-19 MED ORDER — CHLORHEXIDINE GLUCONATE CLOTH 2 % EX PADS
6.0000 | MEDICATED_PAD | Freq: Every day | CUTANEOUS | Status: DC
  Administered 2023-04-20 – 2023-04-26 (×7): 6 via TOPICAL

## 2023-04-19 MED ORDER — SENNA 8.6 MG PO TABS
1.0000 | ORAL_TABLET | Freq: Two times a day (BID) | ORAL | Status: DC
  Administered 2023-04-19 – 2023-04-26 (×15): 8.6 mg via ORAL
  Filled 2023-04-19 (×15): qty 1

## 2023-04-19 MED ORDER — ONDANSETRON HCL 4 MG/2ML IJ SOLN
INTRAMUSCULAR | Status: AC
  Filled 2023-04-19: qty 2

## 2023-04-19 MED ORDER — SODIUM CHLORIDE (PF) 0.9 % IJ SOLN
INTRAMUSCULAR | Status: AC
  Filled 2023-04-19: qty 50

## 2023-04-19 MED ORDER — ONDANSETRON HCL 4 MG/2ML IJ SOLN
INTRAMUSCULAR | Status: DC | PRN
  Administered 2023-04-19: 4 mg via INTRAVENOUS

## 2023-04-19 MED ORDER — ONDANSETRON HCL 4 MG/2ML IJ SOLN: 4.0000 mg | Freq: Four times a day (QID) | INTRAMUSCULAR | Status: DC | PRN

## 2023-04-19 MED ORDER — SUGAMMADEX SODIUM 200 MG/2ML IV SOLN
INTRAVENOUS | Status: DC | PRN
  Administered 2023-04-19: 200 mg via INTRAVENOUS

## 2023-04-19 MED ORDER — HYDROCODONE-ACETAMINOPHEN 7.5-325 MG PO TABS
1.0000 | ORAL_TABLET | ORAL | Status: DC | PRN
  Administered 2023-04-19 – 2023-04-21 (×3): 2 via ORAL
  Administered 2023-04-22: 1 via ORAL
  Administered 2023-04-24: 2 via ORAL
  Administered 2023-04-25: 1 via ORAL
  Administered 2023-04-26: 2 via ORAL
  Filled 2023-04-19: qty 1
  Filled 2023-04-19 (×3): qty 2
  Filled 2023-04-19: qty 1
  Filled 2023-04-19 (×3): qty 2

## 2023-04-19 MED ORDER — BUPIVACAINE-EPINEPHRINE (PF) 0.5% -1:200000 IJ SOLN
INTRAMUSCULAR | Status: AC
  Filled 2023-04-19: qty 30

## 2023-04-19 MED ORDER — FENTANYL CITRATE (PF) 250 MCG/5ML IJ SOLN
INTRAMUSCULAR | Status: AC
  Filled 2023-04-19: qty 5

## 2023-04-19 MED ORDER — CEFAZOLIN SODIUM-DEXTROSE 2-4 GM/100ML-% IV SOLN: 2.0000 g | Freq: Four times a day (QID) | INTRAVENOUS | Status: DC

## 2023-04-19 MED ORDER — LACTATED RINGERS IV SOLN: INTRAVENOUS | Status: DC

## 2023-04-19 MED ORDER — MENTHOL 3 MG MT LOZG: 1.0000 | LOZENGE | OROMUCOSAL | Status: DC | PRN

## 2023-04-19 MED ORDER — FENTANYL CITRATE (PF) 250 MCG/5ML IJ SOLN
INTRAMUSCULAR | Status: DC | PRN
  Administered 2023-04-19: 50 ug via INTRAVENOUS
  Administered 2023-04-19: 25 ug via INTRAVENOUS
  Administered 2023-04-19: 50 ug via INTRAVENOUS
  Administered 2023-04-19: 25 ug via INTRAVENOUS

## 2023-04-19 MED ORDER — PROPOFOL 10 MG/ML IV BOLUS
INTRAVENOUS | Status: AC
  Filled 2023-04-19: qty 20

## 2023-04-19 SURGICAL SUPPLY — 63 items
ADH SKN CLS APL DERMABOND .7 (GAUZE/BANDAGES/DRESSINGS) ×1
ALCOHOL 70% 16 OZ (MISCELLANEOUS) ×1 IMPLANT
APL PRP STRL LF DISP 70% ISPRP (MISCELLANEOUS) ×1
BAG COUNTER SPONGE SURGICOUNT (BAG) ×1 IMPLANT
BAG SPNG CNTER NS LX DISP (BAG) ×1
BLADE CLIPPER SURG (BLADE) IMPLANT
CHLORAPREP W/TINT 26 (MISCELLANEOUS) ×1 IMPLANT
COVER SURGICAL LIGHT HANDLE (MISCELLANEOUS) ×1 IMPLANT
DERMABOND ADVANCED .7 DNX12 (GAUZE/BANDAGES/DRESSINGS) ×2 IMPLANT
DRAPE 3/4 80X56 (DRAPES) ×3 IMPLANT
DRAPE C-ARM 42X72 X-RAY (DRAPES) ×1 IMPLANT
DRAPE STERI IOBAN 125X83 (DRAPES) ×1 IMPLANT
DRAPE U-SHAPE 47X51 STRL (DRAPES) ×3 IMPLANT
DRESSING AQUACEL AG SP 3.5X10 (GAUZE/BANDAGES/DRESSINGS) IMPLANT
DRSG AQUACEL AG ADV 3.5X10 (GAUZE/BANDAGES/DRESSINGS) ×1 IMPLANT
DRSG AQUACEL AG SP 3.5X10 (GAUZE/BANDAGES/DRESSINGS) ×1
ELECT BLADE 4.0 EZ CLEAN MEGAD (MISCELLANEOUS) ×1
ELECT PENCIL ROCKER SW 15FT (MISCELLANEOUS) ×1 IMPLANT
ELECT REM PT RETURN 9FT ADLT (ELECTROSURGICAL) ×1
ELECTRODE BLDE 4.0 EZ CLN MEGD (MISCELLANEOUS) ×1 IMPLANT
ELECTRODE REM PT RTRN 9FT ADLT (ELECTROSURGICAL) ×1 IMPLANT
EVACUATOR 1/8 PVC DRAIN (DRAIN) IMPLANT
GLOVE BIO SURGEON STRL SZ7.5 (GLOVE) ×1 IMPLANT
GLOVE BIO SURGEON STRL SZ8.5 (GLOVE) ×2 IMPLANT
GLOVE BIOGEL PI IND STRL 7.5 (GLOVE) ×1 IMPLANT
GLOVE BIOGEL PI IND STRL 8.5 (GLOVE) ×1 IMPLANT
GOWN STRL REUS W/ TWL XL LVL3 (GOWN DISPOSABLE) ×1 IMPLANT
GOWN STRL REUS W/TWL 2XL LVL3 (GOWN DISPOSABLE) ×1 IMPLANT
GOWN STRL REUS W/TWL XL LVL3 (GOWN DISPOSABLE) ×1
HANDPIECE INTERPULSE COAX TIP (DISPOSABLE) ×1
HEAD FEM +3XOFST 36XMDLR (Orthopedic Implant) IMPLANT
HEAD MODULAR 36MM (Orthopedic Implant) ×1 IMPLANT
HOOD PEEL AWAY T7 (MISCELLANEOUS) ×2 IMPLANT
KIT BASIN OR (CUSTOM PROCEDURE TRAY) ×1 IMPLANT
KIT TURNOVER KIT B (KITS) ×1 IMPLANT
LINER ACETAB OFF G7 5 36 +5 (Liner) IMPLANT
MANIFOLD NEPTUNE II (INSTRUMENTS) ×1 IMPLANT
MARKER SKIN DUAL TIP RULER LAB (MISCELLANEOUS) ×2 IMPLANT
NDL SPNL 18GX3.5 QUINCKE PK (NEEDLE) ×1 IMPLANT
NEEDLE SPNL 18GX3.5 QUINCKE PK (NEEDLE) ×1 IMPLANT
NS IRRIG 1000ML POUR BTL (IV SOLUTION) ×1 IMPLANT
PACK ANTERIOR HIP CUSTOM (KITS) ×1 IMPLANT
PAD ARMBOARD 7.5X6 YLW CONV (MISCELLANEOUS) ×2 IMPLANT
PENCIL BUTTON HOLSTER BLD 10FT (ELECTRODE) IMPLANT
SAW OSC TIP CART 19.5X105X1.3 (SAW) ×1 IMPLANT
SEALER BIPOLAR AQUA 6.0 (INSTRUMENTS) IMPLANT
SET HNDPC FAN SPRY TIP SCT (DISPOSABLE) ×1 IMPLANT
SHELL ACET G7 4H 58 SZG HIP (Shell) IMPLANT
SOLUTION PRONTOSAN WOUND 350ML (IRRIGATION / IRRIGATOR) ×1 IMPLANT
STEM FEM CMTLS 18X156 133D (Stem) IMPLANT
SUT MNCRL AB 3-0 PS2 18 (SUTURE) IMPLANT
SUT MON AB 2-0 CT1 36 (SUTURE) ×1 IMPLANT
SUT VIC AB 2-0 CT1 27 (SUTURE) ×1
SUT VIC AB 2-0 CT1 TAPERPNT 27 (SUTURE) ×1 IMPLANT
SUT VLOC 180 0 24IN GS25 (SUTURE) ×1 IMPLANT
SYR 50ML LL SCALE MARK (SYRINGE) ×1 IMPLANT
TOWEL GREEN STERILE (TOWEL DISPOSABLE) ×1 IMPLANT
TOWEL GREEN STERILE FF (TOWEL DISPOSABLE) ×1 IMPLANT
TRAY CATH INTERMITTENT SS 16FR (CATHETERS) IMPLANT
TRAY FOLEY W/BAG SLVR 16FR (SET/KITS/TRAYS/PACK)
TRAY FOLEY W/BAG SLVR 16FR ST (SET/KITS/TRAYS/PACK) IMPLANT
TUBE SUCTION HIGH CAP CLEAR NV (SUCTIONS) ×1 IMPLANT
WATER STERILE IRR 1000ML POUR (IV SOLUTION) ×3 IMPLANT

## 2023-04-19 NOTE — Progress Notes (Signed)
PROGRESS NOTE  MALEAK Dean Dean:[id number] DOB: 22-Jun-1941 DOA: 04/17/2023 PCP: Angelique Blonder, MD  HPI/Recap of past 10 hours: 82 year old man PMH of throat cancer, gout with recent hospitalization for MSSA bacteremia and left knee septic arthritis with discharge 8/21, with plan for IV antibiotics through September 22 followed by Dr. Algis Liming, presented 9/11 after a fall resulting in left hip fracture. Fall occurred in the context of a dizzy spell, has had multiple dizzy spells over the last couple of weeks as well as significant weight loss since developing knee pain.  Orthopedics consulted.  Patient had surgery/L THA on 9/13.    Today, saw pt after surgery, denies any new complaints   Assessment/Plan: Principal Problem:   Left displaced femoral neck fracture (HCC) Active Problems:   Hypertension   Hypothyroidism   BPH with obstruction/lower urinary tract symptoms   Septic arthritis of knee (HCC)   Protein-calorie malnutrition, severe   Left femoral neck fracture S/p THA on 04/19/23 PT/OT once able Further management by orthopedics   Dizzy spells/presyncope Possible orthostatic hypotension, no orthostatic vitals done on admission due to fracture Poor oral intake/poor appetite EKG unremarkable Last echo done in 03/2023 showed EF of 65 to 70%, no regional wall motion abnormality Carotid ultrasound showed left ICA 1 to 39% stenosis, patent R ICA stent noted Fall precautions PT/OT   MSSA bacteremia  MSSA septic arthritis left knee Plan is for 6 weeks of IV Ancef with PICC line until April 28, 2023 ID on board, rec repeat TTE in the setting of MSSA bacteremia TTE pending Continue Ancef   History of throat cancer in 2013 s/p XRT Carotid art stent on rt following radiation associated narrowing abt 3 years ago Plan for annual visit at [facility] in November   Unintentional weight loss ~40 lb since April 2024 RD consult for wt loss and loss of appetite Further workup as  OP   Abnormal CT angio abdomen pelvis back in June 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 Sherral Hammers from vascular, he reviewed his chart and previous CT scan, advised no further inpatient workup at this time and needs outpatient follow-up   Stable small bilateral pulmonary nodules up to 5 mm in June 2024  Recommendation was for 12 months follow-up if high risk   Abnormal ABI 12/03/2022- non compressible vessels   Chronic back pain Pain management   Prediabetes Recent A1c 5.9 CBG checks and sliding scale insulin   Hypothyroidism Continue Synthroid   Essential hypertension Continue amlodipine and lisinopril   Hyperlipidemia Continue atorvastatin   Mild normocytic anemia Hemoglobin stable 11-12   BPH Continue Flomax      Malnutrition Type:  Nutrition Problem: Severe Malnutrition Etiology: acute illness   Malnutrition Characteristics:  Signs/Symptoms: moderate fat depletion, moderate muscle depletion, percent weight loss Percent weight loss: 21 %   Nutrition Interventions:  Interventions: Ensure Enlive (each supplement provides 350kcal and 20 grams of protein), Refer to RD note for recommendations, Magic cup, Education    Estimated body mass index is 24.34 kg/m as calculated from the following:   Height as of this encounter: 6\' 4"  (1.93 m).   Weight as of this encounter: 90.7 kg.     Code Status: Full  Family Communication: None at bedside  Disposition Plan: Status is: Inpatient Remains inpatient appropriate because: Level of care      Consultants: Orthopedics ID  Procedures: THA on 9/13  Antimicrobials: IV Ancef  DVT  prophylaxis:  ASA 81 BID   Objective: Vitals:   04/19/23 1230 04/19/23 1245 04/19/23 1300 04/19/23 1330  BP: (!) 106/56 (!) 118/57 129/60 (!) 120/59  Pulse: 78 73 73 72  Resp: 14 15 16 14   Temp:   97.8 F (36.6 C) 98.1 F (36.7 C)   TempSrc:      SpO2: 94% 96% 95% 98%  Weight:      Height:        Intake/Output Summary (Last 24 hours) at 04/19/2023 1450 Last data filed at 04/19/2023 1204 Gross per 24 hour  Intake 1340.27 ml  Output 550 ml  Net 790.27 ml   Filed Weights   04/17/23 1743  Weight: 90.7 kg    Exam: General: NAD  Cardiovascular: S1, S2 present Respiratory: CTAB Abdomen: Soft, nontender, nondistended, bowel sounds present Musculoskeletal: No bilateral pedal edema noted, dressing on LLE intact/clean Skin: Normal Psychiatry: Normal mood     Data Reviewed: CBC: Recent Labs  Lab 04/17/23 1449 04/18/23 0150  WBC 4.6 3.1*  NEUTROABS 3.3  --   HGB 11.3* 11.4*  HCT 36.0* 36.0*  MCV 88.0 87.4  PLT 243 234   Basic Metabolic Panel: Recent Labs  Lab 04/17/23 1449 04/18/23 0150  NA 135 131*  K 3.6 4.0  CL 100 98  CO2 26 23  GLUCOSE 119* 161*  BUN 9 10  CREATININE 0.63 0.66  CALCIUM 8.7* 8.9   GFR: Estimated Creatinine Clearance: 90.4 mL/min (by C-G formula based on SCr of 0.66 mg/dL). Liver Function Tests: No results for input(s): "AST", "ALT", "ALKPHOS", "BILITOT", "PROT", "ALBUMIN" in the last 168 hours. No results for input(s): "LIPASE", "AMYLASE" in the last 168 hours. No results for input(s): "AMMONIA" in the last 168 hours. Coagulation Profile: Recent Labs  Lab 04/17/23 1449  INR 1.1   Cardiac Enzymes: No results for input(s): "CKTOTAL", "CKMB", "CKMBINDEX", "TROPONINI" in the last 168 hours. BNP (last 3 results) No results for input(s): "PROBNP" in the last 8760 hours. HbA1C: No results for input(s): "HGBA1C" in the last 72 hours. CBG: No results for input(s): "GLUCAP" in the last 168 hours. Lipid Profile: No results for input(s): "CHOL", "HDL", "LDLCALC", "TRIG", "CHOLHDL", "LDLDIRECT" in the last 72 hours. Thyroid Function Tests: No results for input(s): "TSH", "T4TOTAL", "FREET4", "T3FREE", "THYROIDAB" in the last 72 hours. Anemia Panel: No results for  input(s): "VITAMINB12", "FOLATE", "FERRITIN", "TIBC", "IRON", "RETICCTPCT" in the last 72 hours. Urine analysis:    Component Value Date/Time   COLORURINE YELLOW 03/22/2023 2153   APPEARANCEUR HAZY (A) 03/22/2023 2153   LABSPEC 1.013 03/22/2023 2153   PHURINE 7.0 03/22/2023 2153   GLUCOSEU NEGATIVE 03/22/2023 2153   HGBUR NEGATIVE 03/22/2023 2153   BILIRUBINUR NEGATIVE 03/22/2023 2153   KETONESUR 20 (A) 03/22/2023 2153   PROTEINUR NEGATIVE 03/22/2023 2153   UROBILINOGEN 1.0 01/06/2012 1132   NITRITE NEGATIVE 03/22/2023 2153   LEUKOCYTESUR NEGATIVE 03/22/2023 2153   Sepsis Labs: @LABRCNTIP (procalcitonin:4,lacticidven:4)  ) Recent Results (from the past 240 hour(s))  Surgical pcr screen     Status: None   Collection Time: 04/18/23  9:46 PM   Specimen: Nasal Mucosa; Nasal Swab  Result Value Ref Range Status   MRSA, PCR NEGATIVE NEGATIVE Final   Staphylococcus aureus NEGATIVE NEGATIVE Final    Comment: (NOTE) The Xpert SA Assay (FDA approved for NASAL specimens in patients 39 years of age and older), is one component of a comprehensive surveillance program. It is not intended to diagnose infection nor to guide or monitor treatment. Performed  at [facility], [address], [place], [place] [address]       Studies: DG C-Arm 1-60 Min-No Report  Result Date: 04/19/2023 Fluoroscopy was utilized by the requesting physician.  No radiographic interpretation.   DG C-Arm 1-60 Min-No Report  Result Date: 04/19/2023 Fluoroscopy was utilized by the requesting physician.  No radiographic interpretation.    Scheduled Meds:  allopurinol  100 mg Oral Daily   amLODipine  10 mg Oral Daily   aspirin  81 mg Oral BID   atorvastatin  40 mg Oral QPM   Chlorhexidine Gluconate Cloth  6 each Topical Daily   docusate sodium  100 mg Oral BID   feeding supplement  237 mL Oral TID BM   feeding supplement  296 mL Oral 120 min pre-op   lisinopril  5 mg Oral BID   omega-3 acid ethyl  esters  1 g Oral Daily   oxyCODONE       senna  1 tablet Oral BID   tamsulosin  0.4 mg Oral Daily    Continuous Infusions:  sodium chloride 150 mL/hr at 04/19/23 1353    ceFAZolin (ANCEF) IV 2 g (04/18/23 2110)     LOS: 2 days     Briant Cedar, MD [facility]  If 7PM-7AM, please contact night-coverage [email or web address] 04/19/2023, 2:50 PM

## 2023-04-19 NOTE — Interval H&P Note (Signed)
History and Physical Interval Note:  04/19/2023 9:21 AM  Dean Mayer  has presented today for surgery, with the diagnosis of left hip fracture.  The various methods of treatment have been discussed with the patient and family. After consideration of risks, benefits and other options for treatment, the patient has consented to  Procedure(s): TOTAL HIP ARTHROPLASTY ANTERIOR APPROACH (Left) as a surgical intervention.  The patient's history has been reviewed, patient examined, no change in status, stable for surgery.  I have reviewed the patient's chart and labs.  Questions were answered to the patient's satisfaction.     [name]

## 2023-04-19 NOTE — Anesthesia Postprocedure Evaluation (Signed)
Anesthesia Post Note  Patient: Dean Mayer  Procedure(s) Performed: TOTAL HIP ARTHROPLASTY ANTERIOR APPROACH (Left: Hip)     Patient location during evaluation: PACU Anesthesia Type: General Level of consciousness: awake and alert Pain management: pain level controlled Vital Signs Assessment: post-procedure vital signs reviewed and stable Respiratory status: spontaneous breathing, nonlabored ventilation, respiratory function stable and patient connected to nasal cannula oxygen Cardiovascular status: blood pressure returned to baseline and stable Postop Assessment: no apparent nausea or vomiting Anesthetic complications: no   No notable events documented.  Last Vitals:  Vitals:   04/19/23 1300 04/19/23 1330  BP: 129/60 (!) 120/59  Pulse: 73 72  Resp: 16 14  Temp: 36.6 C 36.7 C  SpO2: 95% 98%    Last Pain:  Vitals:   04/19/23 1437  TempSrc:   PainSc: 6                  Aleiah Mohammed P [place] Kinnear

## 2023-04-19 NOTE — Evaluation (Signed)
Physical Therapy Evaluation Patient Details Name: Dean Mayer MRN: [id number] DOB: November 18, 1940 Today's Date: 04/19/2023  History of Present Illness  Dean Mayer is an 82 year old male who was recently at the SNF where he was completing rehab for a septic knee. He bent over to get something out of a closet and when he stood he became vertiginous and fell. He had immediate left hip pain and could not get up. He was brought to the ED where x-rays showed a hip fx and orthopedic surgery was consulted. He generally lives at home with his wife. PMH includes HTN, throat and prostate CA. 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  Clinical Impression  Pt presents with admitting diagnosis above. Today pt was able to stand with RW Mod A however was limited by pain and dizziness. BP: 82/49 seated after standing. BP: 121/59 supine. Pt was recently admitted a few weeks ago for septic knee then subsequently DC to SNF where pt reports he was close to DC prior to this fall. Pt and wife report that pt had been experiencing sudden onsets of dizziness over the last week while at rehab and pt may benefit from a vestibular evaluation from one of my colleagues during acute stay. Recommend pt return to SNF upon DC. PT will continue to follow.         If plan is discharge home, recommend the following: A lot of help with walking and/or transfers;A lot of help with bathing/dressing/bathroom;Assistance with cooking/housework;Assist for transportation;Help with stairs or ramp for entrance   Can travel by private vehicle   No    Equipment Recommendations Other (comment) (Per accepting facility)  Recommendations for Other Services       Functional Status Assessment Patient has had a recent decline in their functional status and demonstrates the ability to make significant improvements in function in a reasonable and predictable amount of time.     Precautions / Restrictions Precautions Precautions:  Fall Restrictions Weight Bearing Restrictions: Yes LLE Weight Bearing: Weight bearing as tolerated      Mobility  Bed Mobility Overal bed mobility: Needs Assistance Bed Mobility: Supine to Sit, Sit to Supine     Supine to sit: Mod assist Sit to supine: +2 for physical assistance, Total assist   General bed mobility comments: Increased time for getting EOB. Pt reports feeling stuck on the bed requiring Mod A to scoot hips. +2 total via helicopter method to return to bed.    Transfers Overall transfer level: Needs assistance Equipment used: Rolling walker (2 wheels) Transfers: Sit to/from Stand Sit to Stand: From elevated surface, Mod assist           General transfer comment: increased forward flexion, time and effort, ultimately mod A for facilitation of anterior weight shift and rise. Once standing pt become lightheaded and requested to sit down.    Ambulation/Gait               General Gait Details: Deferred for safety.  Stairs            Wheelchair Mobility     Tilt Bed    Modified Rankin (Stroke Patients Only)       Balance Overall balance assessment: Needs assistance Sitting-balance support: No upper extremity supported, Feet supported Sitting balance-Leahy Scale: Good     Standing balance support: Bilateral upper extremity supported Standing balance-Leahy Scale: Poor Standing balance comment: reliant on UE support on RW due to L knee pain and RLE weakness  Pertinent Vitals/Pain Pain Assessment Pain Assessment: No/denies pain    Home Living Family/patient expects to be discharged to:: Skilled nursing facility                   Additional Comments: Pt came from SNF where he was rehabbing L septic knee.    Prior Function Prior Level of Function : Needs assist             Mobility Comments: Pt states that he was walking around hall at [facility] and was close to DC ADLs Comments: Pt  reports that he had been dressing himself over the last week at rehab.     Extremity/Trunk Assessment   Upper Extremity Assessment Upper Extremity Assessment: Generalized weakness    Lower Extremity Assessment Lower Extremity Assessment: LLE deficits/detail LLE Deficits / Details: L THA    Cervical / Trunk Assessment Cervical / Trunk Assessment: Normal  Communication   Communication Communication: No apparent difficulties  Cognition Arousal: Alert Behavior During Therapy: WFL for tasks assessed/performed Overall Cognitive Status: Within Functional Limits for tasks assessed                                 General Comments: very pleasant and conversational        General Comments General comments (skin integrity, edema, etc.): BP: 82/49 seated after standing. BP: 121/59 supine.    Exercises     Assessment/Plan    PT Assessment Patient needs continued PT services  PT Problem List Decreased strength;Decreased activity tolerance;Decreased balance;Decreased mobility;Decreased knowledge of use of DME;Pain       PT Treatment Interventions DME instruction;Gait training;Stair training;Functional mobility training;Therapeutic activities;Therapeutic exercise;Balance training;Neuromuscular re-education;Patient/family education;Wheelchair mobility training    PT Goals (Current goals can be found in the Care Plan section)  Acute Rehab PT Goals Patient Stated Goal: to reduce pain, return to independent mobility PT Goal Formulation: With patient Time For Goal Achievement: 05/03/23 Potential to Achieve Goals: Fair    Frequency Min 1X/week     Co-evaluation               AM-PAC PT "6 Clicks" Mobility  Outcome Measure Help needed turning from your back to your side while in a flat bed without using bedrails?: A Little Help needed moving from lying on your back to sitting on the side of a flat bed without using bedrails?: A Lot Help needed moving to and from  a bed to a chair (including a wheelchair)?: A Lot Help needed standing up from a chair using your arms (e.g., wheelchair or bedside chair)?: A Lot Help needed to walk in hospital room?: A Lot Help needed climbing 3-5 steps with a railing? : Total 6 Click Score: 12    End of Session Equipment Utilized During Treatment: Gait belt Activity Tolerance: Patient tolerated treatment well;Patient limited by pain Patient left: in bed;with call bell/phone within reach;with family/visitor present;with nursing/sitter in room Nurse Communication: Mobility status PT Visit Diagnosis: Other abnormalities of gait and mobility (R26.89);Muscle weakness (generalized) (M62.81);Pain Pain - Right/Left: Left Pain - part of body: Hip    Time: 1445-1535 PT Time Calculation (min) (ACUTE ONLY): 50 min   Charges:   PT Evaluation $PT Eval Moderate Complexity: 1 Mod PT Treatments $Therapeutic Activity: 23-37 mins PT General Charges $$ ACUTE PT VISIT: 1 Visit         Shela Nevin, PT, DPT Acute Rehab Services [phone]   Joyce Gross  [facility] 04/19/2023, 3:54 PM

## 2023-04-19 NOTE — Transfer of Care (Signed)
Immediate Anesthesia Transfer of Care Note  Patient: Dean Mayer  Procedure(s) Performed: TOTAL HIP ARTHROPLASTY ANTERIOR APPROACH (Left: Hip)  Patient Location: PACU  Anesthesia Type:General  Level of Consciousness: sedated  Airway & Oxygen Therapy: Patient Spontanous Breathing and Patient connected to face mask oxygen  Post-op Assessment: Report given to RN and Post -op Vital signs reviewed and stable  Post vital signs: Reviewed and stable  Last Vitals:  Vitals Value Taken Time  BP 116/56 04/19/23 1218  Temp    Pulse 78 04/19/23 1222  Resp 20 04/19/23 1222  SpO2 98 % 04/19/23 1222  Vitals shown include unfiled device data.  Last Pain:  Vitals:   04/19/23 0832  TempSrc: Oral  PainSc: 3       Patients Stated Pain Goal: 3 (04/19/23 1324)  Complications: No notable events documented.

## 2023-04-19 NOTE — Plan of Care (Signed)
Problem: Education: Goal: Knowledge of General Education information will improve Description: Including pain rating scale, medication(s)/side effects and non-pharmacologic comfort measures Outcome: Progressing   Problem: Health Behavior/Discharge Planning: Goal: Ability to manage health-related needs will improve Outcome: Progressing   Problem: Clinical Measurements: Goal: Ability to maintain clinical measurements within normal limits will improve Outcome: Progressing Goal: Will remain free from infection Outcome: Progressing Goal: Diagnostic test results will improve Outcome: Progressing Goal: Respiratory complications will improve Outcome: Progressing Goal: Cardiovascular complication will be avoided Outcome: Progressing   Problem: Activity: Goal: Risk for activity intolerance will decrease Outcome: Progressing   Problem: Nutrition: Goal: Adequate nutrition will be maintained Outcome: Progressing   Problem: Coping: Goal: Level of anxiety will decrease Outcome: Progressing   Problem: Elimination: Goal: Will not experience complications related to bowel motility Outcome: Progressing Goal: Will not experience complications related to urinary retention Outcome: Progressing   Problem: Pain Managment: Goal: General experience of comfort will improve Outcome: Progressing   Problem: Safety: Goal: Ability to remain free from injury will improve Outcome: Progressing   Problem: Skin Integrity: Goal: Risk for impaired skin integrity will decrease Outcome: Progressing   Problem: Education: Goal: Verbalization of understanding the information provided (i.e., activity precautions, restrictions, etc) will improve Outcome: Progressing Goal: Individualized Educational Video(s) Outcome: Progressing   Problem: Activity: Goal: Ability to ambulate and perform ADLs will improve Outcome: Progressing   Problem: Clinical Measurements: Goal: Postoperative complications will be  avoided or minimized Outcome: Progressing   Problem: Self-Concept: Goal: Ability to maintain and perform role responsibilities to the fullest extent possible will improve Outcome: Progressing   Problem: Pain Management: Goal: Pain level will decrease Outcome: Progressing   Problem: Education: Goal: Knowledge of the prescribed therapeutic regimen will improve Outcome: Progressing Goal: Understanding of discharge needs will improve Outcome: Progressing Goal: Individualized Educational Video(s) Outcome: Progressing   Problem: Activity: Goal: Ability to avoid complications of mobility impairment will improve Outcome: Progressing Goal: Ability to tolerate increased activity will improve Outcome: Progressing   Problem: Clinical Measurements: Goal: Postoperative complications will be avoided or minimized Outcome: Progressing   Problem: Pain Management: Goal: Pain level will decrease with appropriate interventions Outcome: Progressing   Problem: Skin Integrity: Goal: Will show signs of wound healing Outcome: Progressing

## 2023-04-19 NOTE — Anesthesia Procedure Notes (Signed)
Procedure Name: Intubation Date/Time: 04/19/2023 10:26 AM  Performed by: Alwyn Ren, CRNAPre-anesthesia Checklist: Patient identified, Emergency Drugs available, Suction available and Patient being monitored Patient Re-evaluated:Patient Re-evaluated prior to induction Oxygen Delivery Method: Circle system utilized Preoxygenation: Pre-oxygenation with 100% oxygen Induction Type: IV induction Ventilation: Mask ventilation without difficulty Laryngoscope Size: Miller and 2 Grade View: Grade I Tube type: Oral Tube size: 7.0 mm Number of attempts: 1 Airway Equipment and Method: Stylet and Oral airway Placement Confirmation: ETT inserted through vocal cords under direct vision, positive ETCO2 and breath sounds checked- equal and bilateral Secured at: 22 cm Tube secured with: Tape Dental Injury: Teeth and Oropharynx as per pre-operative assessment

## 2023-04-19 NOTE — Discharge Instructions (Signed)
? ?Dr. Arlys John Swinteck ?Joint Replacement Specialist ?[facility] ?[address] ?[place], [place] [address] ?[phone] ? ? ?TOTAL HIP REPLACEMENT POSTOPERATIVE DIRECTIONS ? ? ? ?Hip Rehabilitation, Guidelines Following Surgery  ? ?WEIGHT BEARING ?Weight bearing as tolerated with assist device (walker, cane, etc) as directed, use it as long as suggested by your surgeon or therapist, typically at least 4-6 weeks. ? ?The results of a hip operation are greatly improved after range of motion and muscle strengthening exercises. Follow all safety measures which are given to protect your hip. If any of these exercises cause increased pain or swelling in your joint, decrease the amount until you are comfortable again. Then slowly increase the exercises. Call your caregiver if you have problems or questions.  ? ?HOME CARE INSTRUCTIONS  ?Most of the following instructions are designed to prevent the dislocation of your new hip.  ?Remove items at home which could result in a fall. This includes throw rugs or furniture in walking pathways.  ?Continue medications as instructed at time of discharge. ?You may have some home medications which will be placed on hold until you complete the course of blood thinner medication. ?You may start showering once you are discharged home. Do not remove your dressing. ?Do not put on socks or shoes without following the instructions of your caregivers.   ?Sit on chairs with arms. Use the chair arms to help push yourself up when arising.  ?Arrange for the use of a toilet seat elevator so you are not sitting low.  ?Walk with walker as instructed.  ?You may resume a sexual relationship in one month or when given the OK by your caregiver.  ?Use walker as long as suggested by your caregivers.  ?You may put full weight on your legs and walk as much as is comfortable. ?Avoid periods of inactivity such as sitting longer than an hour when not asleep. This helps prevent blood  clots.  ?You may return to work once you are cleared by Designer, industrial/product.  ?Do not drive a car for 6 weeks or until released by your surgeon.  ?Do not drive while taking narcotics.  ?Wear elastic stockings for two weeks following surgery during the day but you may remove then at night.  ?Make sure you keep all of your appointments after your operation with all of your doctors and caregivers. You should call the office at the above phone number and make an appointment for approximately two weeks after the date of your surgery. ?Please pick up a stool softener and laxative for home use as long as you are requiring pain medications. ?ICE to the affected hip every three hours for 30 minutes at a time and then as needed for pain and swelling. Continue to use ice on the hip for pain and swelling from surgery. You may notice swelling that will progress down to the foot and ankle.  This is normal after surgery.  Elevate the leg when you are not up walking on it.   ?It is important for you to complete the blood thinner medication as prescribed by your doctor. ?Continue to use the breathing machine which will help keep your temperature down.  It is common for your temperature to cycle up and down following surgery, especially at night when you are not up moving around and exerting yourself.  The breathing machine keeps your lungs expanded and your temperature down. ? ?RANGE OF MOTION AND STRENGTHENING EXERCISES  ?These exercises are designed to help you  keep full movement of your hip joint. Follow your caregiver's or physical therapist's instructions. Perform all exercises about fifteen times, three times per day or as directed. Exercise both hips, even if you have had only one joint replacement. These exercises can be done on a training (exercise) mat, on the floor, on a table or on a bed. Use whatever works the best and is most comfortable for you. Use music or television while you are exercising so that the exercises are a  pleasant break in your day. This will make your life better with the exercises acting as a break in routine you can look forward to.  ?Lying on your back, slowly slide your foot toward your buttocks, raising your knee up off the floor. Then slowly slide your foot back down until your leg is straight again.  ?Lying on your back spread your legs as far apart as you can without causing discomfort.  ?Lying on your side, raise your upper leg and foot straight up from the floor as far as is comfortable. Slowly lower the leg and repeat.  ?Lying on your back, tighten up the muscle in the front of your thigh (quadriceps muscles). You can do this by keeping your leg straight and trying to raise your heel off the floor. This helps strengthen the largest muscle supporting your knee.  ?Lying on your back, tighten up the muscles of your buttocks both with the legs straight and with the knee bent at a comfortable angle while keeping your heel on the floor.  ? ?SKILLED REHAB INSTRUCTIONS: ?If the patient is transferred to a skilled rehab facility following release from the hospital, a list of the current medications will be sent to the facility for the patient to continue.  When discharged from the skilled rehab facility, please have the facility set up the patient's Home Health Physical Therapy prior to being released. Also, the skilled facility will be responsible for providing the patient with their medications at time of release from the facility to include their pain medication and their blood thinner medication. If the patient is still at the rehab facility at time of the two week follow up appointment, the skilled rehab facility will also need to assist the patient in arranging follow up appointment in our office and any transportation needs. ? ?POST-OPERATIVE OPIOID TAPER INSTRUCTIONS: ?It is important to wean off of your opioid medication as soon as possible. If you do not need pain medication after your surgery it is ok  to stop day one. ?Opioids include: ?Codeine, Hydrocodone(Norco, Vicodin), Oxycodone(Percocet, oxycontin) and hydromorphone amongst others.  ?Long term and even short term use of opiods can cause: ?Increased pain response ?Dependence ?Constipation ?Depression ?Respiratory depression ?And more.  ?Withdrawal symptoms can include ?Flu like symptoms ?Nausea, vomiting ?And more ?Techniques to manage these symptoms ?Hydrate well ?Eat regular healthy meals ?Stay active ?Use relaxation techniques(deep breathing, meditating, yoga) ?Do Not substitute Alcohol to help with tapering ?If you have been on opioids for less than two weeks and do not have pain than it is ok to stop all together.  ?Plan to wean off of opioids ?This plan should start within one week post op of your joint replacement. ?Maintain the same interval or time between taking each dose and first decrease the dose.  ?Cut the total daily intake of opioids by one tablet each day ?Next start to increase the time between doses. ?The last dose that should be eliminated is the evening dose.  ? ? ?MAKE  SURE YOU:  ?Understand these instructions.  ?Will watch your condition.  ?Will get help right away if you are not doing well or get worse. ? ?Pick up stool softner and laxative for home use following surgery while on pain medications. ?Do not remove your dressing. ?The dressing is waterproof--it is OK to take showers. ?Continue to use ice for pain and swelling after surgery. ?Do not use any lotions or creams on the incision until instructed by your surgeon. ?Total Hip Protocol. ? ?

## 2023-04-19 NOTE — Op Note (Signed)
OPERATIVE REPORT  SURGEON: Samson Frederic, MD   ASSISTANT: Clint Bolder, PA-C  PREOPERATIVE DIAGNOSIS: Displaced Left femoral neck fracture.   POSTOPERATIVE DIAGNOSIS: Displaced Left femoral neck fracture.   PROCEDURE: Left total hip arthroplasty, anterior approach.   IMPLANTS: Biomet Taperloc Reduced Distal stem, size 18x168mm, high offset. Biomet G7 OsseoTi Cup, size 58 mm. Biomet Vivacit-E liner, size 36 mm, G, +5 mm neutral. Biomet metal head ball, size 36 + 3 mm.  ANESTHESIA:  General  ANTIBIOTICS: 2g ancef.  ESTIMATED BLOOD LOSS:-250 mL    DRAINS: None.  COMPLICATIONS: None   CONDITION: PACU - hemodynamically stable.   BRIEF CLINICAL NOTE: Dean Mayer is a 82 y.o. male with a displaced Left femoral neck fracture. The patient was admitted to the hospitalist service and underwent perioperative risk stratification and medical optimization. The risks, benefits, and alternatives to total hip arthroplasty were explained, and the patient elected to proceed.  PROCEDURE IN DETAIL: The patient was taken to the operating room and general anesthesia was induced on the hospital bed.  The patient was then positioned on the Hana table.  All bony prominences were well padded.  The hip was prepped and draped in the normal sterile surgical fashion.  A time-out was called verifying side and site of surgery. Antibiotics were given within 60 minutes of beginning the procedure.   Bikini incision was made, and the direct anterior approach to the hip was performed through the Hueter interval.  Lateral femoral circumflex vessels were treated with the Auqumantys. The anterior capsule was exposed and an inverted T capsulotomy was made.  Fracture hematoma was encountered and evacuated. The patient was found to have a comminuted Left subcapital femoral neck fracture.  I freshened the femoral neck cut with a saw.  I removed the femoral neck fragment.  A corkscrew was placed into the head and the head  was removed.  This was passed to the back table and was measured. The pubofemoral ligament was released subperiosteally to the lesser trochanter.  Acetabular exposure was achieved, and the pulvinar and labrum were excised. Sequential reaming of the acetabulum was then performed up to a size 57 mm reamer under direct visulization. A 58 mm cup was then opened and impacted into place at approximately 40 degrees of abduction and 20 degrees of anteversion. The final polyethylene liner was impacted into place and acetabular osteophytes were removed.    I then gained femoral exposure taking care to protect the abductors and greater trochanter.  This was performed using standard external rotation, extension, and adduction.  A cookie cutter was used to enter the femoral canal, and then the femoral canal finder was placed.  Sequential broaching was performed up to a size 18.  Calcar planer was used on the femoral neck remnant.  I placed a high offset neck and a trial head ball.  The hip was reduced.  Leg lengths and offset were checked fluoroscopically.  The hip was dislocated and trial components were removed.  The final implants were placed, and the hip was reduced.  Fluoroscopy was used to confirm component position and leg lengths.  At 90 degrees of external rotation and full extension, the hip was stable to an anterior directed force.   The wound was copiously irrigated with Irrisept solution and normal saline using pulse lavage.  Marcaine solution was injected into the periarticular soft tissue.  The wound was closed in layers using #1 Stratafix for the fascia, 2-0 Vicryl for the subcutaneous fat, 2-0 Monocryl for  the deep dermal layer, and staples + Dermabond for the skin.  Once the glue was fully dried, an Aquacell Ag dressing was applied.  The patient was transported to the recovery room in stable condition.  Sponge, needle, and instrument counts were correct at the end of the case x2.  The patient tolerated  the procedure well and there were no known complications.  The aquamantis was utilized for this case to help facilitate better hemostasis as patient was felt to be at increased risk of bleeding because of preop anemia.  A oscillating saw tip was utilized for this case to prevent damage to the soft tissue structures such as muscles, ligaments and tendons, and to ensure accurate bone cuts. This patient was at increased risk for above structures due to minimally invasive approach.  Please note that a surgical assistant was a medical necessity for this procedure to perform it in a safe and expeditious manner. Assistant was necessary to provide appropriate retraction of vital neurovascular structures, to prevent femoral fracture, and to allow for anatomic placement of the prosthesis.

## 2023-04-20 ENCOUNTER — Inpatient Hospital Stay (HOSPITAL_COMMUNITY): Payer: Medicare HMO

## 2023-04-20 ENCOUNTER — Encounter (HOSPITAL_COMMUNITY): Payer: Self-pay | Admitting: Orthopedic Surgery

## 2023-04-20 DIAGNOSIS — S72002A Fracture of unspecified part of neck of left femur, initial encounter for closed fracture: Secondary | ICD-10-CM | POA: Diagnosis not present

## 2023-04-20 DIAGNOSIS — R7881 Bacteremia: Secondary | ICD-10-CM | POA: Diagnosis not present

## 2023-04-20 LAB — ECHOCARDIOGRAM LIMITED
Height: 76 in
Weight: 3200 [oz_av]

## 2023-04-20 MED ORDER — AMLODIPINE BESYLATE 5 MG PO TABS: 5.0000 mg | ORAL_TABLET | Freq: Every day | ORAL | Status: DC

## 2023-04-20 NOTE — Progress Notes (Addendum)
Physical Therapy Treatment & Vestibular Assessment Patient Details Name: Dean Mayer MRN: [id number] DOB: [birth year] Today's Date: 04/20/2023   History of Present Illness Pt is an 82 y.o. male who presented 04/17/23 s/p fall at [facility]. Imaging revealed L hip fx. S/p L THA 9/13. Of note, pt recently admitted 8/14 -8/21 with left knee meniscal tear-managed without surgery and previously hospitalized 8/8 -8/14 for MSSA left knee septic arthritis. PMH includes HTN, throat and prostate CA.    PT Comments  Performed Vestibular Assessment per recommendation by evaluating therapist yesterday. Pt's symptoms do not appear to be related to a vestibular dysfunction, but rather likely are due to orthostatic hypotension, see below. Pt was symptomatic this session and is at high risk for subsequent falls due to this. Pt was able to progress to ambulating a short distance in the room today though, but needed a RW and minA to do so. He needed increased time to initiate and complete all functional mobility due to pain and lightheadedness this date. Will continue to follow acutely.    Vestibular Assessment - 04/20/23 0001       Vestibular Assessment   General Observation Pt signs/symptoms are not consistent with a vestibular dysfunction. His symptoms appear to be related to his positive orthostatic vital signs.      Symptom Behavior   Subjective history of current problem Pt reports he began to feel like he was "about to faint" ~1.5 weeks ago and he has x5 episodes of this. He reports an episode resulted in his fall and hip fx. He also describes the episode that caused his fall to be as if objects were moving away from him and he saw "snowflakes and leaves" falling around him. He reports losing ~50 lbs quickly and recently and has been curious about needing to adjust his BP meds.    Type of Dizziness  Lightheadedness;Comment   "about to faint"   Frequency of Dizziness x5 episodes in ~1.5 weeks    Duration of  Dizziness unknown    Symptom Nature Motion provoked   activity   Aggravating Factors Activity in general    Relieving Factors Rest    Progression of Symptoms Worse    History of similar episodes no      Oculomotor Exam   Oculomotor Alignment Normal    Ocular ROM WFL    Spontaneous Absent    Smooth Pursuits Saccades   particularly with tracking to R, otherwise smooth   Saccades Undershoots    Comment no dizziness reported      Oculomotor Exam-Fixation Suppressed    Ocular Alignment Normal    Ocular ROM WFL    Spontaneous Nystagmus negative      Positional Testing   Dix-Hallpike Dix-Hallpike Left;Dix-Hallpike Right    Horizontal Canal Testing Horizontal Canal Right;Horizontal Canal Left      Dix-Hallpike Right   Dix-Hallpike Right Duration negative    Dix-Hallpike Right Symptoms No nystagmus      Dix-Hallpike Left   Dix-Hallpike Left Duration negative    Dix-Hallpike Left Symptoms No nystagmus      Horizontal Canal Right   Horizontal Canal Right Duration negative    Horizontal Canal Right Symptoms Normal      Horizontal Canal Left   Horizontal Canal Left Duration negative    Horizontal Canal Left Symptoms Normal      Orthostatics   BP supine (x 5 minutes) 122/54    HR supine (x 5 minutes) 88    BP sitting 102/55  HR sitting 101    BP standing (after 1 minute) 123/70    BP standing (after 3 minutes) 99/58   sitting after ambulating; dropped further to 88/53 with second stand bout   HR standing (after 3 minutes) 101    Orthostatics Comment Pt's BP dropped with prolonged standing and repeated standing bouts and pt became lightheaded and pale in the face.                If plan is discharge home, recommend the following: A lot of help with walking and/or transfers;A lot of help with bathing/dressing/bathroom;Assistance with cooking/housework;Assist for transportation;Help with stairs or ramp for entrance   Can travel by private vehicle     No  Equipment  Recommendations  Other (comment) (Per accepting facility)    Recommendations for Other Services       Precautions / Restrictions Precautions Precautions: Fall Precaution Comments: watch BP (orthostatic) Restrictions Weight Bearing Restrictions: Yes LLE Weight Bearing: Weight bearing as tolerated     Mobility  Bed Mobility Overal bed mobility: Needs Assistance Bed Mobility: Supine to Sit     Supine to sit: Mod assist, HOB elevated, Used rails     General bed mobility comments: Increased time with incremental assistance in managing L leg towards and off R EOB, using bed pad to pivot hips    Transfers Overall transfer level: Needs assistance Equipment used: Rolling walker (2 wheels) Transfers: Sit to/from Stand Sit to Stand: From elevated surface, Mod assist, Min assist           General transfer comment: Pt maintains L foot anteriorly placed to R due to pain and reportedly limited L knee flexion ROM. MinA to power up to stand and shift weight anteriorly transferring from elevated EOB to RW. ModA to do so from low surface of recliner. Elevated seat of recliner later by adding several blankets for pt to sit on.    Ambulation/Gait Ambulation/Gait assistance: Min assist Gait Distance (Feet): 7 Feet Assistive device: Rolling walker (2 wheels) Gait Pattern/deviations: Step-to pattern, Decreased step length - left, Decreased stance time - left, Decreased stride length, Decreased weight shift to left, Trunk flexed, Antalgic Gait velocity: reduced Gait velocity interpretation: <1.31 ft/sec, indicative of household ambulator   General Gait Details: Pt takes slow, small antalgic steps anteriorly away from EOB. He fatigues quickly and chair brought behind him for him to sit. MinA for balance   Stairs             Wheelchair Mobility     Tilt Bed    Modified Rankin (Stroke Patients Only)       Balance Overall balance assessment: Needs assistance Sitting-balance  support: No upper extremity supported, Feet supported Sitting balance-Leahy Scale: Fair     Standing balance support: Bilateral upper extremity supported, During functional activity, Reliant on assistive device for balance Standing balance-Leahy Scale: Poor Standing balance comment: reliant on UE support on RW, minA first bout but modA to prevent posterior LOB on second bout (likely due to BP dropping)                            Cognition Arousal: Alert Behavior During Therapy: WFL for tasks assessed/performed Overall Cognitive Status: Within Functional Limits for tasks assessed                                 General Comments:  very pleasant and conversational        Exercises      General Comments General comments (skin integrity, edema, etc.): Pt reports has lost ~50 lbs really quickly recently and he has been asking whether his BP meds need to be adjusted; Vitals- 122/54 (74) & 88 bpm supine, 102/55 (69) & 101 bpm sitting, 123/70s standing, 99/58 (71) & 101 bpm sitting after ambulating, 88/53 after standing a second rep, 101/56 once reclined end of session; pt was symptomatic; RN and MD notified      Pertinent Vitals/Pain Pain Assessment Pain Assessment: Faces Faces Pain Scale: Hurts little more Pain Location: L hip and knee Pain Descriptors / Indicators: Discomfort, Guarding, Grimacing, Operative site guarding Pain Intervention(s): Limited activity within patient's tolerance, Monitored during session, Repositioned    Home Living                          Prior Function            PT Goals (current goals can now be found in the care plan section) Acute Rehab PT Goals Patient Stated Goal: to reduce pain, return to independent mobility PT Goal Formulation: With patient/family Time For Goal Achievement: 05/04/23 Potential to Achieve Goals: Fair Progress towards PT goals: Progressing toward goals    Frequency    Min 1X/week       PT Plan      Co-evaluation              AM-PAC PT "6 Clicks" Mobility   Outcome Measure  Help needed turning from your back to your side while in a flat bed without using bedrails?: A Little Help needed moving from lying on your back to sitting on the side of a flat bed without using bedrails?: A Lot Help needed moving to and from a bed to a chair (including a wheelchair)?: A Lot Help needed standing up from a chair using your arms (e.g., wheelchair or bedside chair)?: A Lot Help needed to walk in hospital room?: Total (< 20 ft) Help needed climbing 3-5 steps with a railing? : Total 6 Click Score: 11    End of Session Equipment Utilized During Treatment: Gait belt Activity Tolerance: Patient tolerated treatment well;Patient limited by pain;Other (comment) (limited by orthostatic hypotension) Patient left: with call bell/phone within reach;with family/visitor present;in chair;with chair alarm set Nurse Communication: Mobility status;Other (comment) (BP) PT Visit Diagnosis: Other abnormalities of gait and mobility (R26.89);Muscle weakness (generalized) (M62.81);Pain;Unsteadiness on feet (R26.81);Difficulty in walking, not elsewhere classified (R26.2);Dizziness and giddiness (R42) Pain - Right/Left: Left Pain - part of body: Hip     Time: 1303-1400 PT Time Calculation (min) (ACUTE ONLY): 57 min  Charges:    $Therapeutic Activity: 53-67 mins PT General Charges $$ ACUTE PT VISIT: 1 Visit                     Virgil Benedict, PT, DPT Acute Rehabilitation Services  Office: [phone]    Bettina Gavia 04/20/2023, 6:21 PM

## 2023-04-20 NOTE — Plan of Care (Signed)

## 2023-04-20 NOTE — Progress Notes (Signed)
PROGRESS NOTE  Dean Mayer WUJ:[id number] DOB: September 25, 1940 DOA: 04/17/2023 PCP: Angelique Blonder, MD  HPI/Recap of past 47 hours: 82 year old man PMH of throat cancer, gout with recent hospitalization for MSSA bacteremia and left knee septic arthritis with discharge 8/21, with plan for IV antibiotics through September 22 followed by Dr. Algis Liming, presented 9/11 after a fall resulting in left hip fracture. Fall occurred in the context of a dizzy spell, has had multiple dizzy spells over the last couple of weeks as well as significant weight loss since developing knee pain.  Orthopedics consulted.  Patient had surgery/L THA on 9/13.    Today, pt continues to deny any new complaints. While working with PT, noted to be orthostatic positive with soft BP. Reported dizziness upon standing, denies any chest pain, SOB. Pain well controlled.    Assessment/Plan: Principal Problem:   Left displaced femoral neck fracture (HCC) Active Problems:   Hypertension   Hypothyroidism   BPH with obstruction/lower urinary tract symptoms   Septic arthritis of knee (HCC)   Protein-calorie malnutrition, severe   Left femoral neck fracture S/p THA on 04/19/23 PT/OT- rec SNF Further management by orthopedics   Dizzy spells/presyncope Likely 2/2 orthostatic hypotension with soft BP Poor oral intake/poor appetite with recent weight loss and on BP meds EKG unremarkable Last echo done in 03/2023 showed EF of 65 to 70%, no regional wall motion abnormality Carotid ultrasound showed left ICA 1 to 39% stenosis, patent R ICA stent noted Start IVF, hold all BP meds for now and plan to adjust pending BP readings Fall precautions PT/OT   MSSA bacteremia  MSSA septic arthritis left knee Plan is for 6 weeks of IV Ancef with PICC line until April 28, 2023 ID on board, rec repeat TTE in the setting of MSSA bacteremia Repeat TTE with no evidence of valvular vegetations Continue Ancef   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 Sherral Hammers from vascular, he reviewed his chart and previous CT scan, advised no further inpatient workup at this time and needs outpatient follow-up   Stable small bilateral pulmonary nodules up to 5 mm in June 2024  Recommendation was for 12 months follow-up if high risk   Abnormal ABI 12/03/2022- non compressible vessels   Chronic back pain Pain management   Prediabetes Recent A1c 5.9 CBG checks and sliding scale insulin   Hypothyroidism Continue Synthroid   Essential hypertension Hold amlodipine and lisinopril as noted above   Hyperlipidemia Continue atorvastatin   Mild normocytic anemia Hemoglobin stable 11-12   BPH Continue Flomax      Malnutrition Type:  Nutrition Problem: Severe Malnutrition Etiology: acute illness   Malnutrition Characteristics:  Signs/Symptoms: moderate fat depletion, moderate muscle depletion, percent weight loss Percent weight loss: 21 %   Nutrition Interventions:  Interventions: Ensure Enlive (each supplement provides 350kcal and 20 grams of protein), Refer to RD note for recommendations, Magic cup, Education    Estimated body mass index is 24.34 kg/m as calculated from the following:   Height as of this encounter: 6\' 4"  (1.93 m).   Weight  as of this encounter: 90.7 kg.     Code Status: Full  Family Communication: None at bedside  Disposition Plan: Status is: Inpatient Remains inpatient appropriate because: Level of care      Consultants: Orthopedics ID  Procedures: THA on  9/13  Antimicrobials: IV Ancef  DVT prophylaxis:  ASA 81 BID   Objective: Vitals:   04/19/23 2025 04/19/23 2027 04/20/23 0626 04/20/23 0758  BP: (!) 108/52 (!) 108/52 (!) 134/51 (!) 116/52  Pulse:  80 73 76  Resp:  18 18 17   Temp:  97.8 F (36.6 C) (!) 97.5 F (36.4 C)   TempSrc:  Oral Oral   SpO2:  99% 99% 99%  Weight:      Height:        Intake/Output Summary (Last 24 hours) at 04/20/2023 1530 Last data filed at 04/20/2023 0300 Gross per 24 hour  Intake 1711.85 ml  Output 750 ml  Net 961.85 ml   Filed Weights   04/17/23 1743  Weight: 90.7 kg    Exam: General: NAD  Cardiovascular: S1, S2 present Respiratory: CTAB Abdomen: Soft, nontender, nondistended, bowel sounds present Musculoskeletal: No bilateral pedal edema noted, dressing on LLE intact/clean Skin: Normal Psychiatry: Normal mood     Data Reviewed: CBC: Recent Labs  Lab 04/17/23 1449 04/18/23 0150  WBC 4.6 3.1*  NEUTROABS 3.3  --   HGB 11.3* 11.4*  HCT 36.0* 36.0*  MCV 88.0 87.4  PLT 243 234   Basic Metabolic Panel: Recent Labs  Lab 04/17/23 1449 04/18/23 0150  NA 135 131*  K 3.6 4.0  CL 100 98  CO2 26 23  GLUCOSE 119* 161*  BUN 9 10  CREATININE 0.63 0.66  CALCIUM 8.7* 8.9   GFR: Estimated Creatinine Clearance: 90.4 mL/min (by C-G formula based on SCr of 0.66 mg/dL). Liver Function Tests: No results for input(s): "AST", "ALT", "ALKPHOS", "BILITOT", "PROT", "ALBUMIN" in the last 168 hours. No results for input(s): "LIPASE", "AMYLASE" in the last 168 hours. No results for input(s): "AMMONIA" in the last 168 hours. Coagulation Profile: Recent Labs  Lab 04/17/23 1449  INR 1.1   Cardiac Enzymes: No results for input(s): "CKTOTAL", "CKMB", "CKMBINDEX", "TROPONINI" in the last 168 hours. BNP (last 3 results) No results for input(s): "PROBNP" in the last 8760 hours. HbA1C: No results for input(s): "HGBA1C" in the last 72 hours. CBG: No results for input(s): "GLUCAP" in the last  168 hours. Lipid Profile: No results for input(s): "CHOL", "HDL", "LDLCALC", "TRIG", "CHOLHDL", "LDLDIRECT" in the last 72 hours. Thyroid Function Tests: No results for input(s): "TSH", "T4TOTAL", "FREET4", "T3FREE", "THYROIDAB" in the last 72 hours. Anemia Panel: No results for input(s): "VITAMINB12", "FOLATE", "FERRITIN", "TIBC", "IRON", "RETICCTPCT" in the last 72 hours. Urine analysis:    Component Value Date/Time   COLORURINE YELLOW 03/22/2023 2153   APPEARANCEUR HAZY (A) 03/22/2023 2153   LABSPEC 1.013 03/22/2023 2153   PHURINE 7.0 03/22/2023 2153   GLUCOSEU NEGATIVE 03/22/2023 2153   HGBUR NEGATIVE 03/22/2023 2153   BILIRUBINUR NEGATIVE 03/22/2023 2153   KETONESUR 20 (A) 03/22/2023 2153   PROTEINUR NEGATIVE 03/22/2023 2153   UROBILINOGEN 1.0 01/06/2012 1132   NITRITE NEGATIVE 03/22/2023 2153   LEUKOCYTESUR NEGATIVE 03/22/2023 2153   Sepsis Labs: @LABRCNTIP (procalcitonin:4,lacticidven:4)  ) Recent Results (from the past 240 hour(s))  Surgical pcr screen     Status: None   Collection Time: 04/18/23  9:46 PM   Specimen: Nasal Mucosa; Nasal Swab  Result Value Ref Range Status   MRSA, PCR NEGATIVE  NEGATIVE Final   Staphylococcus aureus NEGATIVE NEGATIVE Final    Comment: (NOTE) The Xpert SA Assay (FDA approved for NASAL specimens in patients 26 years of age and older), is one component of a comprehensive surveillance program. It is not intended to diagnose infection nor to guide or monitor treatment. Performed at [facility], [address], [place], [place] [address]       Studies: ECHOCARDIOGRAM LIMITED  Result Date: 04/20/2023    ECHOCARDIOGRAM LIMITED REPORT   Patient Name:   SOLOMON LILLQUIST Date of Exam: 04/20/2023 Medical Rec #:  [id number]         Height:       76.0 in Accession #:    [id number]        Weight:       200.0 lb Date of Birth:  April 18, 1941         BSA:          2.215 m Patient Age:    82 years          BP:           134/51 mmHg Patient  Gender: M                 HR:           80 bpm. Exam Location:  Inpatient Procedure: Limited Echo and Color Doppler Indications:    Bacteremia  History:        Patient has prior history of Echocardiogram examinations, most                 recent 03/17/2023. Stroke, Signs/Symptoms:Bacteremia; Risk                 Factors:Hypertension.  Sonographer:    Milbert Coulter Referring Phys: [id number] GLOVF SWINTECK  Sonographer Comments: Technically challenging study due to limited acoustic windows, suboptimal parasternal window, suboptimal apical window and suboptimal subcostal window. Image acquisition challenging due to respiratory motion and Image acquisition challenging due to patient body habitus. IMPRESSIONS  1. Very limited study. Grossly normal left and right ventricular function. There are mild degenerative changes and calcification of the aortic leaflets and mitral annulus. No major valvular abnormalities are seen. Conclusion(s)/Recommendation(s): No evidence of valvular vegetations on this transthoracic echocardiogram. Consider a transesophageal echocardiogram to exclude infective endocarditis if clinically indicated. FINDINGS  Additional Comments: Color Doppler performed.  Thurmon Fair MD Electronically signed by Thurmon Fair MD Signature Date/Time: 04/20/2023/12:14:48 PM    Final     Scheduled Meds:  allopurinol  100 mg Oral Daily   aspirin  81 mg Oral BID   atorvastatin  40 mg Oral QPM   Chlorhexidine Gluconate Cloth  6 each Topical Daily   docusate sodium  100 mg Oral BID   feeding supplement  237 mL Oral TID BM   feeding supplement  296 mL Oral 120 min pre-op   omega-3 acid ethyl esters  1 g Oral Daily   senna  1 tablet Oral BID   tamsulosin  0.4 mg Oral Daily    Continuous Infusions:  sodium chloride 10 mL/hr at 04/20/23 1014    ceFAZolin (ANCEF) IV 2 g (04/20/23 1013)     LOS: 3 days     Briant Cedar, MD [facility]  If 7PM-7AM, please contact  night-coverage [email or web address] 04/20/2023, 3:30 PM

## 2023-04-20 NOTE — Plan of Care (Signed)
  Problem: Education: Goal: Knowledge of General Education information will improve Description: Including pain rating scale, medication(s)/side effects and non-pharmacologic comfort measures Outcome: Progressing   Problem: Health Behavior/Discharge Planning: Goal: Ability to manage health-related needs will improve Outcome: Progressing   Problem: Activity: Goal: Risk for activity intolerance will decrease Outcome: Progressing   

## 2023-04-20 NOTE — Plan of Care (Signed)
  Problem: Education: Goal: Knowledge of General Education information will improve Description: Including pain rating scale, medication(s)/side effects and non-pharmacologic comfort measures Outcome: Progressing   Problem: Activity: Goal: Risk for activity intolerance will decrease Outcome: Progressing   Problem: Pain Managment: Goal: General experience of comfort will improve Outcome: Progressing   Problem: Safety: Goal: Ability to remain free from injury will improve Outcome: Progressing   Problem: Skin Integrity: Goal: Risk for impaired skin integrity will decrease Outcome: Progressing   Problem: Education: Goal: Verbalization of understanding the information provided (i.e., activity precautions, restrictions, etc) will improve Outcome: Progressing   Problem: Activity: Goal: Ability to ambulate and perform ADLs will improve Outcome: Progressing

## 2023-04-20 NOTE — Progress Notes (Signed)
   Subjective: 1 Day Post-Op Procedure(s) (LRB): TOTAL HIP ARTHROPLASTY ANTERIOR APPROACH (Left)  Overall pt doing well Denies any pain in the left hip this morning, just a mild spasm No pain with ambulation today  Denies any new symptoms or issues Patient reports pain as none.  Objective:   VITALS:   Vitals:   04/20/23 0626 04/20/23 0758  BP: (!) 134/51 (!) 116/52  Pulse: 73 76  Resp: 18 17  Temp: (!) 97.5 F (36.4 C)   SpO2: 99% 99%    Left hip: incision healing well Dressing intact Nv intact distally No rashes or edema distally Good early rom  LABS Recent Labs    04/17/23 1449 04/18/23 0150  HGB 11.3* 11.4*  HCT 36.0* 36.0*  WBC 4.6 3.1*  PLT 243 234    Recent Labs    04/17/23 1449 04/18/23 0150  NA 135 131*  K 3.6 4.0  BUN 9 10  CREATININE 0.63 0.66  GLUCOSE 119* 161*     Assessment/Plan: 1 Day Post-Op Procedure(s) (LRB): TOTAL HIP ARTHROPLASTY ANTERIOR APPROACH (Left) Continue PT/OT, weight bearing as tolerated Pain management Pulmonary toilet D/c planning     Brad Antonieta Iba, MPAS [facility] is now [facility]  [place] [address], [place], [place] [address] Phone: [phone] [email or web address] Facebook  [employer]

## 2023-04-20 NOTE — TOC CAGE-AID Note (Signed)
Transition of Care [facility]) - CAGE-AID Screening   Patient Details  Name: Dean Mayer MRN: [id number] Date of Birth: 1941/07/02  Transition of Care [facility] CM/SW Contact:    Leota Sauers, RN Phone Number: 04/20/2023, 4:20 AM   Clinical Narrative:  Patient denies use of alcohol and illicit drugs. Resources not given at this time.  CAGE-AID Screening:    Have You Ever Felt You Ought to Cut Down on Your Drinking or Drug Use?: No Have People Annoyed You By Critizing Your Drinking Or Drug Use?: No Have You Felt Bad Or Guilty About Your Drinking Or Drug Use?: No Have You Ever Had a Drink or Used Drugs First Thing In The Morning to Steady Your Nerves or to Get Rid of a Hangover?: No CAGE-AID Score: 0  Substance Abuse Education Offered: No

## 2023-04-21 DIAGNOSIS — S72002A Fracture of unspecified part of neck of left femur, initial encounter for closed fracture: Secondary | ICD-10-CM | POA: Diagnosis not present

## 2023-04-21 LAB — CBC
HCT: 26.1 % — ABNORMAL LOW (ref 39.0–52.0)
Hemoglobin: 8.2 g/dL — ABNORMAL LOW (ref 13.0–17.0)
MCH: 28.2 pg (ref 26.0–34.0)
MCHC: 31.4 g/dL (ref 30.0–36.0)
MCV: 89.7 fL (ref 80.0–100.0)
Platelets: 199 10*3/uL (ref 150–400)
RBC: 2.91 MIL/uL — ABNORMAL LOW (ref 4.22–5.81)
RDW: 14.3 % (ref 11.5–15.5)
WBC: 4.2 10*3/uL (ref 4.0–10.5)
nRBC: 0 % (ref 0.0–0.2)

## 2023-04-21 LAB — BASIC METABOLIC PANEL
Anion gap: 6 (ref 5–15)
BUN: 17 mg/dL (ref 8–23)
CO2: 25 mmol/L (ref 22–32)
Calcium: 8 mg/dL — ABNORMAL LOW (ref 8.9–10.3)
Chloride: 105 mmol/L (ref 98–111)
Creatinine, Ser: 0.64 mg/dL (ref 0.61–1.24)
GFR, Estimated: >60 mL/min (ref >60)
Glucose, Bld: 116 mg/dL — ABNORMAL HIGH (ref 70–99)
Potassium: 3.9 mmol/L (ref 3.5–5.1)
Sodium: 136 mmol/L (ref 135–145)

## 2023-04-21 MED ORDER — PANTOPRAZOLE SODIUM 40 MG PO TBEC
40.0000 mg | DELAYED_RELEASE_TABLET | Freq: Every day | ORAL | Status: DC
  Administered 2023-04-21 – 2023-04-26 (×6): 40 mg via ORAL
  Filled 2023-04-21 (×6): qty 1

## 2023-04-21 NOTE — Progress Notes (Signed)
Pharmacy Antibiotic Note  Dean Mayer is a 82 y.o. male admitted on 04/17/2023 after a fall. Keynen was previously being treated for MSSA bacteremia secondary to left knee septic arthritis with cefazolin  Pharmacy has been consulted for cefazolin dosing.   Bakary is afebrile with a normal WBC.   Plan: Continue cefazolin 2g IV q8 hours  Continue to follow ID recommendations  Stop date in place for 9/22  Height: 6\' 4"  (193 cm) Weight: 90.7 kg (200 lb) IBW/kg (Calculated) : 86.8  Temp (24hrs), Avg:97.9 F (36.6 C), Min:97.8 F (36.6 C), Max:98 F (36.7 C)  Recent Labs  Lab 04/17/23 1449 04/18/23 0150 04/21/23 0414  WBC 4.6 3.1* 4.2  CREATININE 0.63 0.66 0.64    Estimated Creatinine Clearance: 90.4 mL/min (by C-G formula based on SCr of 0.64 mg/dL).    No Known Allergies  Antimicrobials this admission: Cefazolin 9/11 >  Dose adjustments this admission: N/A  Microbiology results: 9/12 MRSA PCR: negative   Thank you for allowing pharmacy to be a part of this patient's care.  Blane Ohara, PharmD  PGY2 Pharmacy Resident

## 2023-04-21 NOTE — Progress Notes (Addendum)
PROGRESS NOTE  Dean Mayer ZHY:[id number] DOB: 1941-03-29 DOA: 04/17/2023 PCP: Angelique Blonder, MD  HPI/Recap of past 80 hours: 82 year old man PMH of throat cancer, gout with recent hospitalization for MSSA bacteremia and left knee septic arthritis with discharge 8/21, with plan for IV antibiotics through September 22 followed by Dr. Algis Liming, presented 9/11 after a fall resulting in left hip fracture. Fall occurred in the context of a dizzy spell, has had multiple dizzy spells over the last couple of weeks as well as significant weight loss since developing knee pain.  Orthopedics consulted.  Patient had surgery/L THA on 9/13.    Today, met patient eating breakfast, denies any new complaints.  Orthostatic vitals done today shows some improvement.    Assessment/Plan: Principal Problem:   Left displaced femoral neck fracture (HCC) Active Problems:   Hypertension   Hypothyroidism   BPH with obstruction/lower urinary tract symptoms   Septic arthritis of knee (HCC)   Protein-calorie malnutrition, severe   Left femoral neck fracture S/p THA on 04/19/23 PT/OT- rec SNF Further management by orthopedics   Dizzy spells/presyncope Likely 2/2 orthostatic hypotension with soft BP Poor oral intake/poor appetite with recent weight loss and on BP meds EKG unremarkable Last echo done in 03/2023 showed EF of 65 to 70%, no regional wall motion abnormality Carotid ultrasound showed left ICA 1 to 39% stenosis, patent R ICA stent noted Continue IVF, hold all BP meds for now and plan to adjust pending BP readings Fall precautions PT/OT  ABLA Mild normocytic anemia Hemoglobin baseline 11-12, now down to 8.2 No signs of bleeding, likely from recent surgery Daily CBC  MSSA bacteremia  MSSA septic arthritis left knee Plan is for 6 weeks of IV Ancef with PICC line until April 28, 2023 ID on board, rec repeat TTE in the setting of MSSA bacteremia Repeat TTE with no evidence of  valvular vegetations Continue Ancef   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 Sherral Hammers from vascular, he reviewed his chart and previous CT scan, advised no further inpatient workup at this time and needs outpatient follow-up   Stable small bilateral pulmonary nodules up to 5 mm in June 2024  Recommendation was for 12 months follow-up if high risk   Abnormal ABI 12/03/2022- non compressible vessels   Chronic back pain Pain management   Prediabetes Recent A1c 5.9 CBG checks and sliding scale insulin   Hypothyroidism Continue Synthroid   Essential hypertension Hold amlodipine and lisinopril as noted above   Hyperlipidemia Continue atorvastatin   BPH Continue Flomax      Malnutrition Type:  Nutrition Problem: Severe Malnutrition Etiology: acute illness   Malnutrition Characteristics:  Signs/Symptoms: moderate fat depletion, moderate muscle depletion, percent weight loss Percent weight loss: 21 %   Nutrition Interventions:  Interventions: Ensure Enlive (each supplement provides 350kcal and 20 grams of protein), Refer to RD note for recommendations, Magic cup, Education    Estimated body mass index is 24.34 kg/m as calculated from the following:   Height as of this encounter: 6\' 4"  (1.93 m).   Weight as of  this encounter: 90.7 kg.     Code Status: Full  Family Communication: None at bedside  Disposition Plan: Status is: Inpatient Remains inpatient appropriate because: Level of care      Consultants: Orthopedics ID  Procedures: THA on  9/13  Antimicrobials: IV Ancef  DVT prophylaxis:  ASA 81 BID   Objective: Vitals:   04/20/23 2042 04/21/23 0557 04/21/23 0800 04/21/23 1315  BP: (!) 118/57 111/62 (!) 127/51 (!) 136/55  Pulse: 68 69 66 79  Resp: 16 16 18 18   Temp: 97.8 F (36.6 C) 97.8 F (36.6 C) 98 F (36.7 C) 98.4 F (36.9 C)  TempSrc: Oral Oral Oral Oral  SpO2: 99%  100% 97%  Weight:      Height:        Intake/Output Summary (Last 24 hours) at 04/21/2023 1506 Last data filed at 04/21/2023 0554 Gross per 24 hour  Intake --  Output 2100 ml  Net -2100 ml   Filed Weights   04/17/23 1743  Weight: 90.7 kg    Exam: General: NAD  Cardiovascular: S1, S2 present Respiratory: CTAB Abdomen: Soft, nontender, nondistended, bowel sounds present Musculoskeletal: No bilateral pedal edema noted, dressing on LLE intact/clean Skin: Normal Psychiatry: Normal mood     Data Reviewed: CBC: Recent Labs  Lab 04/17/23 1449 04/18/23 0150 04/21/23 0414  WBC 4.6 3.1* 4.2  NEUTROABS 3.3  --   --   HGB 11.3* 11.4* 8.2*  HCT 36.0* 36.0* 26.1*  MCV 88.0 87.4 89.7  PLT 243 234 199   Basic Metabolic Panel: Recent Labs  Lab 04/17/23 1449 04/18/23 0150 04/21/23 0414  NA 135 131* 136  K 3.6 4.0 3.9  CL 100 98 105  CO2 26 23 25   GLUCOSE 119* 161* 116*  BUN 9 10 17   CREATININE 0.63 0.66 0.64  CALCIUM 8.7* 8.9 8.0*   GFR: Estimated Creatinine Clearance: 90.4 mL/min (by C-G formula based on SCr of 0.64 mg/dL). Liver Function Tests: No results for input(s): "AST", "ALT", "ALKPHOS", "BILITOT", "PROT", "ALBUMIN" in the last 168 hours. No results for input(s): "LIPASE", "AMYLASE" in the last 168 hours. No results for input(s): "AMMONIA" in the last 168 hours. Coagulation Profile: Recent Labs  Lab 04/17/23 1449  INR 1.1   Cardiac Enzymes: No results for input(s): "CKTOTAL", "CKMB", "CKMBINDEX", "TROPONINI" in the last 168 hours. BNP (last 3 results) No results for input(s): "PROBNP" in the last 8760  hours. HbA1C: No results for input(s): "HGBA1C" in the last 72 hours. CBG: No results for input(s): "GLUCAP" in the last 168 hours. Lipid Profile: No results for input(s): "CHOL", "HDL", "LDLCALC", "TRIG", "CHOLHDL", "LDLDIRECT" in the last 72 hours. Thyroid Function Tests: No results for input(s): "TSH", "T4TOTAL", "FREET4", "T3FREE", "THYROIDAB" in the last 72 hours. Anemia Panel: No results for input(s): "VITAMINB12", "FOLATE", "FERRITIN", "TIBC", "IRON", "RETICCTPCT" in the last 72 hours. Urine analysis:    Component Value Date/Time   COLORURINE YELLOW 03/22/2023 2153   APPEARANCEUR HAZY (A) 03/22/2023 2153   LABSPEC 1.013 03/22/2023 2153   PHURINE 7.0 03/22/2023 2153   GLUCOSEU NEGATIVE 03/22/2023 2153   HGBUR NEGATIVE 03/22/2023 2153   BILIRUBINUR NEGATIVE 03/22/2023 2153   KETONESUR 20 (A) 03/22/2023 2153   PROTEINUR NEGATIVE 03/22/2023 2153   UROBILINOGEN 1.0 01/06/2012 1132   NITRITE NEGATIVE 03/22/2023 2153   LEUKOCYTESUR NEGATIVE 03/22/2023 2153   Sepsis Labs: @LABRCNTIP (procalcitonin:4,lacticidven:4)  ) Recent Results (from the past 240 hour(s))  Surgical pcr screen     Status: None   Collection Time: 04/18/23  9:46 PM   Specimen: Nasal Mucosa; Nasal Swab  Result Value Ref Range Status   MRSA, PCR NEGATIVE NEGATIVE Final   Staphylococcus aureus NEGATIVE NEGATIVE Final    Comment: (NOTE) The Xpert SA Assay (FDA approved for NASAL specimens in patients 76 years of age and older), is one component of a comprehensive surveillance program. It is not intended to diagnose infection nor to guide or monitor treatment. Performed at [facility], [address], [place], [place] [address]       Studies: No results found.  Scheduled Meds:  allopurinol  100 mg Oral Daily   aspirin  81 mg Oral BID   atorvastatin  40 mg Oral QPM   Chlorhexidine Gluconate Cloth  6 each Topical Daily   docusate sodium  100 mg Oral BID   feeding supplement  237 mL Oral TID BM    feeding supplement  296 mL Oral 120 min pre-op   omega-3 acid ethyl esters  1 g Oral Daily   pantoprazole  40 mg Oral Daily   senna  1 tablet Oral BID   tamsulosin  0.4 mg Oral Daily    Continuous Infusions:  sodium chloride 100 mL/hr at 04/20/23 2236    ceFAZolin (ANCEF) IV 2 g (04/21/23 1004)     LOS: 4 days     Briant Cedar, MD [facility]  If 7PM-7AM, please contact night-coverage [email or web address] 04/21/2023, 3:06 PM

## 2023-04-21 NOTE — Plan of Care (Signed)

## 2023-04-21 NOTE — Progress Notes (Signed)
   Subjective: 2 Days Post-Op Procedure(s) (LRB): TOTAL HIP ARTHROPLASTY ANTERIOR APPROACH (Left)  Pt doing well Denies any new symptoms or issues overnight Denies any numbness or tingling distally Therapy progressing well Patient reports pain as mild.  Objective:   VITALS:   Vitals:   04/21/23 0557 04/21/23 0800  BP: 111/62 (!) 127/51  Pulse: 69 66  Resp: 16 18  Temp: 97.8 F (36.6 C) 98 F (36.7 C)  SpO2:  100%    Left hip incision healing well Nv intact distally No rashes or edema distally Good gentle rom without pain  LABS Recent Labs    04/21/23 0414  HGB 8.2*  HCT 26.1*  WBC 4.2  PLT 199    Recent Labs    04/21/23 0414  NA 136  K 3.9  BUN 17  CREATININE 0.64  GLUCOSE 116*     Assessment/Plan: 2 Days Post-Op Procedure(s) (LRB): TOTAL HIP ARTHROPLASTY ANTERIOR APPROACH (Left) Overall pt doing well Continue PT/OT D/c planning Pain management as needed     Elizebeth Koller, MPAS [facility] is now [facility] [employer] [facility] [address], [address], [place], [place] [address] Phone: [phone] [email or web address] Facebook  [employer]

## 2023-04-22 ENCOUNTER — Other Ambulatory Visit (HOSPITAL_COMMUNITY): Payer: Self-pay

## 2023-04-22 ENCOUNTER — Telehealth (HOSPITAL_COMMUNITY): Payer: Self-pay | Admitting: Pharmacy Technician

## 2023-04-22 DIAGNOSIS — B9561 Methicillin susceptible Staphylococcus aureus infection as the cause of diseases classified elsewhere: Secondary | ICD-10-CM

## 2023-04-22 DIAGNOSIS — M00069 Staphylococcal arthritis, unspecified knee: Secondary | ICD-10-CM | POA: Diagnosis not present

## 2023-04-22 DIAGNOSIS — R7881 Bacteremia: Secondary | ICD-10-CM | POA: Diagnosis not present

## 2023-04-22 DIAGNOSIS — S72002A Fracture of unspecified part of neck of left femur, initial encounter for closed fracture: Secondary | ICD-10-CM | POA: Diagnosis not present

## 2023-04-22 LAB — CBC WITH DIFFERENTIAL/PLATELET
Abs Immature Granulocytes: 0.05 10*3/uL (ref 0.00–0.07)
Basophils Absolute: 0 10*3/uL (ref 0.0–0.1)
Basophils Relative: 0 %
Eosinophils Absolute: 0.1 10*3/uL (ref 0.0–0.5)
Eosinophils Relative: 2 %
HCT: 28.3 % — ABNORMAL LOW (ref 39.0–52.0)
Hemoglobin: 8.9 g/dL — ABNORMAL LOW (ref 13.0–17.0)
Immature Granulocytes: 1 %
Lymphocytes Relative: 20 %
Lymphs Abs: 0.9 10*3/uL (ref 0.7–4.0)
MCH: 27.6 pg (ref 26.0–34.0)
MCHC: 31.4 g/dL (ref 30.0–36.0)
MCV: 87.6 fL (ref 80.0–100.0)
Monocytes Absolute: 0.3 10*3/uL (ref 0.1–1.0)
Monocytes Relative: 6 %
Neutro Abs: 3.3 10*3/uL (ref 1.7–7.7)
Neutrophils Relative %: 71 %
Platelets: 239 10*3/uL (ref 150–400)
RBC: 3.23 MIL/uL — ABNORMAL LOW (ref 4.22–5.81)
RDW: 14.3 % (ref 11.5–15.5)
WBC: 4.6 10*3/uL (ref 4.0–10.5)
nRBC: 0 % (ref 0.0–0.2)

## 2023-04-22 LAB — BASIC METABOLIC PANEL
Anion gap: 7 (ref 5–15)
BUN: 10 mg/dL (ref 8–23)
CO2: 27 mmol/L (ref 22–32)
Calcium: 8.2 mg/dL — ABNORMAL LOW (ref 8.9–10.3)
Chloride: 100 mmol/L (ref 98–111)
Creatinine, Ser: 0.6 mg/dL — ABNORMAL LOW (ref 0.61–1.24)
GFR, Estimated: >60 mL/min (ref >60)
Glucose, Bld: 142 mg/dL — ABNORMAL HIGH (ref 70–99)
Potassium: 3.5 mmol/L (ref 3.5–5.1)
Sodium: 134 mmol/L — ABNORMAL LOW (ref 135–145)

## 2023-04-22 LAB — FERRITIN: Ferritin: 217 ng/mL (ref 24–336)

## 2023-04-22 LAB — IRON AND TIBC
Iron: 28 ug/dL — ABNORMAL LOW (ref 45–182)
Saturation Ratios: 19 % (ref 17.9–39.5)
TIBC: 148 ug/dL — ABNORMAL LOW (ref 250–450)
UIBC: 120 ug/dL

## 2023-04-22 LAB — VITAMIN B12: Vitamin B-12: 385 pg/mL (ref 180–914)

## 2023-04-22 LAB — FOLATE: Folate: 10.3 ng/mL (ref >5.9)

## 2023-04-22 MED ORDER — HYDROCODONE-ACETAMINOPHEN 5-325 MG PO TABS
1.0000 | ORAL_TABLET | ORAL | 0 refills | Status: DC | PRN
Start: 2023-04-22 — End: 2023-04-26
  Filled 2023-04-22: qty 40, 7d supply, fill #0

## 2023-04-22 MED ORDER — LINEZOLID 600 MG PO TABS
600.0000 mg | ORAL_TABLET | Freq: Two times a day (BID) | ORAL | Status: DC
  Administered 2023-04-22 – 2023-04-26 (×9): 600 mg via ORAL
  Filled 2023-04-22 (×11): qty 1

## 2023-04-22 MED ORDER — FERROUS SULFATE 325 (65 FE) MG PO TABS
325.0000 mg | ORAL_TABLET | Freq: Every day | ORAL | Status: DC
  Administered 2023-04-23 – 2023-04-26 (×4): 325 mg via ORAL
  Filled 2023-04-22 (×4): qty 1

## 2023-04-22 MED ORDER — VITAMIN B-12 1000 MCG PO TABS
1000.0000 ug | ORAL_TABLET | Freq: Every day | ORAL | Status: DC
  Administered 2023-04-22 – 2023-04-26 (×5): 1000 ug via ORAL
  Filled 2023-04-22 (×5): qty 1

## 2023-04-22 MED ORDER — LINEZOLID 600 MG PO TABS
600.0000 mg | ORAL_TABLET | Freq: Two times a day (BID) | ORAL | 0 refills | Status: DC
Start: 2023-04-22 — End: 2023-04-26
  Filled 2023-04-22: qty 12, 6d supply, fill #0

## 2023-04-22 MED ORDER — ASPIRIN 81 MG PO CHEW
81.0000 mg | CHEWABLE_TABLET | Freq: Two times a day (BID) | ORAL | 0 refills | Status: AC
Start: 2023-04-22 — End: 2023-06-06
  Filled 2023-04-22: qty 90, 45d supply, fill #0

## 2023-04-22 NOTE — Progress Notes (Signed)
[facility]  Date of Admission:  04/17/2023     Total days of antibiotics 6         ASSESSMENT:  Dean Mayer is scheduled to complete treatment for MSSA native knee septic arthritis on 04/28/23 and has been receiving Cefazolin with no adverse side effects. Discussed plan of care and will discontinue PICC line and discharge with Linezolid 600 mg PO bid until 04/28/23. Continue post-operative wound care per Orthopedics. Follow up in ID clinic as planned with Dr. Daiva Eves on 05/08/23.   PLAN:  Change antibiotics to Linezolid 600 mg PO bid through 04/28/23. Discontinue PICC line.  Post-operative wound care per Orthopedics.  Follow up in ID Clinic on 05/08/23 with Dr. Daiva Eves.  [facility] for discharge from ID standpoint. Remaining medical and supportive care per Internal Medicine.   Principal Problem:   Left displaced femoral neck fracture (HCC) Active Problems:   Hypertension   Hypothyroidism   BPH with obstruction/lower urinary tract symptoms   Septic arthritis of knee (HCC)   Protein-calorie malnutrition, severe    allopurinol  100 mg Oral Daily   aspirin  81 mg Oral BID   atorvastatin  40 mg Oral QPM   Chlorhexidine Gluconate Cloth  6 each Topical Daily   docusate sodium  100 mg Oral BID   feeding supplement  237 mL Oral TID BM   feeding supplement  296 mL Oral 120 min pre-op   omega-3 acid ethyl esters  1 g Oral Daily   pantoprazole  40 mg Oral Daily   senna  1 tablet Oral BID   tamsulosin  0.4 mg Oral Daily    SUBJECTIVE:  Afebrile overnight with no acute events. Ready to go home. No new concerns/complaints.   No Known Allergies   Review of Systems: Review of Systems  Constitutional:  Negative for chills, fever and weight loss.  Respiratory:  Negative for cough, shortness of breath and wheezing.   Cardiovascular:  Negative for chest pain and leg swelling.  Gastrointestinal:  Negative for abdominal pain, constipation, diarrhea, nausea and  vomiting.  Skin:  Negative for rash.      OBJECTIVE: Vitals:   04/21/23 1315 04/21/23 2125 04/22/23 0752 04/22/23 1351  BP: (!) 136/55 (!) 154/65 (!) 174/71 131/72  Pulse: 79 89 78 82  Resp: 18 16 17 18   Temp: 98.4 F (36.9 C) 98.7 F (37.1 C) 98.6 F (37 C) 98.8 F (37.1 C)  TempSrc: Oral Oral Oral Oral  SpO2: 97% 98% 97% 98%  Weight:      Height:       Body mass index is 24.34 kg/m.  Physical Exam Constitutional:      General: He is not in acute distress.    Appearance: He is well-developed.  Cardiovascular:     Rate and Rhythm: Normal rate and regular rhythm.     Heart sounds: Normal heart sounds.  Pulmonary:     Effort: Pulmonary effort is normal.     Breath sounds: Normal breath sounds.  Skin:    General: Skin is warm and dry.  Neurological:     Mental Status: He is alert.  Psychiatric:        Mood and Affect: Mood normal.     Lab Results Lab Results  Component Value Date   WBC 4.6 04/22/2023   HGB 8.9 (L) 04/22/2023   HCT 28.3 (L) 04/22/2023   MCV 87.6 04/22/2023   PLT 239 04/22/2023    Lab  Results  Component Value Date   CREATININE 0.60 (L) 04/22/2023   BUN 10 04/22/2023   NA 134 (L) 04/22/2023   K 3.5 04/22/2023   CL 100 04/22/2023   CO2 27 04/22/2023    Lab Results  Component Value Date   ALT 8 03/23/2023   AST 22 03/23/2023   ALKPHOS 92 03/23/2023   BILITOT 0.4 03/23/2023     Microbiology: Recent Results (from the past 240 hour(s))  Surgical pcr screen     Status: None   Collection Time: 04/18/23  9:46 PM   Specimen: Nasal Mucosa; Nasal Swab  Result Value Ref Range Status   MRSA, PCR NEGATIVE NEGATIVE Final   Staphylococcus aureus NEGATIVE NEGATIVE Final    Comment: (NOTE) The Xpert SA Assay (FDA approved for NASAL specimens in patients 47 years of age and older), is one component of a comprehensive surveillance program. It is not intended to diagnose infection nor to guide or monitor treatment. Performed at [facility], [address], [place], [place] [address]      Marcos Eke, NP [facility]  04/22/2023  2:04 PM

## 2023-04-22 NOTE — Telephone Encounter (Signed)
Pharmacy Patient Advocate Encounter  Received notification from [employer] that Prior Authorization for Linezolid 600MG  tablets  has been APPROVED from 04/22/2023 to 05/20/2023. Ran test claim, Copay is $13.65. This test claim was processed through [facility]- copay amounts may vary at other pharmacies due to pharmacy/plan contracts, or as the patient moves through the different stages of their insurance plan.   PA #/Case ID/Reference #: [id number]

## 2023-04-22 NOTE — Progress Notes (Signed)
Physical Therapy Treatment Patient Details Name: Dean Mayer MRN: [id number] DOB: [birth year] Today's Date: 04/22/2023   History of Present Illness Pt is an 82 y.o. male who presented 04/17/23 s/p fall at [facility]. Imaging revealed L hip fx. S/p L THA 9/13. Of note, pt recently admitted 8/14 -8/21 with left knee meniscal tear-managed without surgery and previously hospitalized 8/8 -8/14 for MSSA left knee septic arthritis. PMH includes HTN, throat and prostate CA.    PT Comments  Pt with fair tolerance to treatment today. Pt continues to require Min/Mod A for all mobility and was once again limited by dizziness. BP: 148/73 supine, BP: 125/99 seated, BP: 120/57 standing, BP: 136/86 supine. Pt reported dizziness ~30 sec after standing. No change in DC/DME recs at this time. Pt anticipates return to SNF tomorrow.    If plan is discharge home, recommend the following: A lot of help with walking and/or transfers;A lot of help with bathing/dressing/bathroom;Assistance with cooking/housework;Assist for transportation;Help with stairs or ramp for entrance   Can travel by private vehicle     No  Equipment Recommendations  Other (comment) (Per accepting facility)    Recommendations for Other Services OT consult     Precautions / Restrictions Precautions Precautions: Fall Precaution Comments: watch BP (orthostatic) Restrictions Weight Bearing Restrictions: Yes LLE Weight Bearing: Weight bearing as tolerated     Mobility  Bed Mobility Overal bed mobility: Needs Assistance Bed Mobility: Supine to Sit, Sit to Supine     Supine to sit: Mod assist, HOB elevated, Used rails Sit to supine: Max assist, HOB elevated, Used rails   General bed mobility comments: Increased time with incremental assistance in managing L leg towards and off R EOB, using bed pad to pivot hips (Similar presentation to previous session. Total back to bed due to dizziness.)    Transfers Overall transfer level: Needs  assistance Equipment used: Rolling walker (2 wheels) Transfers: Sit to/from Stand Sit to Stand: From elevated surface, Mod assist, Min assist           General transfer comment: Pt maintains L foot anteriorly placed to R due to pain and reportedly limited L knee flexion ROM. Min/Mod A to stand and power up. Min A for sidesteps along EOB  prior to pt getting dizzy.    Ambulation/Gait               General Gait Details: Deferred for safety.   Stairs             Wheelchair Mobility     Tilt Bed    Modified Rankin (Stroke Patients Only)       Balance Overall balance assessment: Needs assistance Sitting-balance support: No upper extremity supported, Feet supported Sitting balance-Leahy Scale: Fair     Standing balance support: Bilateral upper extremity supported, During functional activity, Reliant on assistive device for balance Standing balance-Leahy Scale: Poor Standing balance comment: Reliant on RW                            Cognition Arousal: Alert Behavior During Therapy: WFL for tasks assessed/performed Overall Cognitive Status: Within Functional Limits for tasks assessed                                 General Comments: very pleasant and conversational        Exercises      General Comments General comments (  skin integrity, edema, etc.): BP: 148/73 supine, BP: 125/99 seated, BP: 120/57 standing, BP: 136/86 supine. Pt reported dizziness ~30 sec after standing.      Pertinent Vitals/Pain Pain Assessment Pain Assessment: Faces Faces Pain Scale: Hurts little more Pain Location: L hip and knee Pain Descriptors / Indicators: Discomfort, Guarding, Grimacing, Operative site guarding Pain Intervention(s): Monitored during session, Limited activity within patient's tolerance, Repositioned    Home Living                          Prior Function            PT Goals (current goals can now be found in the  care plan section) Progress towards PT goals: Progressing toward goals    Frequency    Min 1X/week      PT Plan      Co-evaluation              AM-PAC PT "6 Clicks" Mobility   Outcome Measure  Help needed turning from your back to your side while in a flat bed without using bedrails?: A Little Help needed moving from lying on your back to sitting on the side of a flat bed without using bedrails?: A Lot Help needed moving to and from a bed to a chair (including a wheelchair)?: A Lot Help needed standing up from a chair using your arms (e.g., wheelchair or bedside chair)?: A Lot Help needed to walk in hospital room?: Total Help needed climbing 3-5 steps with a railing? : Total 6 Click Score: 11    End of Session Equipment Utilized During Treatment: Gait belt Activity Tolerance: Patient tolerated treatment well;Patient limited by pain;Other (comment) (Limited by dizziness) Patient left: in bed;with call bell/phone within reach Nurse Communication: Mobility status;Other (comment) (BP) PT Visit Diagnosis: Other abnormalities of gait and mobility (R26.89);Muscle weakness (generalized) (M62.81);Pain;Unsteadiness on feet (R26.81);Difficulty in walking, not elsewhere classified (R26.2);Dizziness and giddiness (R42) Pain - Right/Left: Left Pain - part of body: Hip     Time: 4098-1191 PT Time Calculation (min) (ACUTE ONLY): 29 min  Charges:    $Therapeutic Activity: 23-37 mins PT General Charges $$ ACUTE PT VISIT: 1 Visit                     Shela Nevin, PT, DPT Acute Rehab Services [phone]    [name] 04/22/2023, 3:30 PM

## 2023-04-22 NOTE — [place] FL2 (Signed)
[place] MEDICAID FL2 LEVEL OF CARE FORM     IDENTIFICATION  Patient Name: Dean Mayer Birthdate: Dec 31, 1940 Sex: male Admission Date (Current Location): 04/17/2023  [facility] and [place] Number:  Producer, television/film/video and Address:  [facility], [address], [place], [place] [address]      Provider Number: [id number]  Attending Physician Name and Address:  Briant Cedar, MD  Relative Name and Phone Number:  Dhruva, Fearnley [phone]  [phone]    Current Level of Care: Hospital Recommended Level of Care: Skilled Nursing Facility Prior Approval Number:    Date Approved/Denied:   PASRR Number: [id number]  Discharge Plan: SNF    Current Diagnoses: Patient Active Problem List   Diagnosis Date Noted   Protein-calorie malnutrition, severe 04/19/2023   Left displaced femoral neck fracture (HCC) 04/17/2023   Septic arthritis of knee (HCC) 04/10/2023   Painful knee 03/21/2023   MSSA bacteremia 03/18/2023   Acute gout of right ankle 03/18/2023   Bacterial infection of knee joint (HCC) 03/15/2023   Swelling of right foot 02/20/2023   Sedimentation rate elevation 02/20/2023   Positive ANA (antinuclear antibody) 02/20/2023   Peripheral arterial disease (HCC) 12/04/2022   Cellulitis of right lower extremity 12/03/2022   Peripheral edema 12/02/2022   Cellulitis of right leg 12/01/2022   Hypertension 12/01/2022   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   Dyspnea on exertion 12/01/2022   Hypothyroidism 12/01/2022   Carotid stenosis 08/29/2021   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   Malignant neoplasm of prostate (HCC) 05/28/2018   Primary open angle glaucoma (POAG) of right eye, severe stage 09/16/2017   Lacunar infarct, acute (HCC) 01/05/2016   Confusion 01/04/2016   Hypertensive urgency 01/04/2016   Left-sided weakness 01/04/2016   Stroke (HCC) 01/04/2016   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   Basal cell carcinoma of left eyelid 05/09/2015   Carcinoma of left eyelid 04/25/2015   Acne rosacea 07/26/2014   Eyelid lesion 07/26/2014   Primary open angle glaucoma of both eyes 02/23/2013   Visual field defect 02/23/2013   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    Orientation RESPIRATION BLADDER Height & Weight     Self, Time, Situation, Place  Normal Continent Weight: 200 lb (90.7 kg) Height:  6\' 4"  (193 cm)  BEHAVIORAL SYMPTOMS/MOOD NEUROLOGICAL BOWEL NUTRITION STATUS      Continent Diet (see discharge summary)  AMBULATORY STATUS COMMUNICATION OF NEEDS Skin   Limited Assist Verbally Surgical wounds                       Personal Care Assistance Level of Assistance  Bathing, Feeding, Dressing Bathing Assistance: Limited assistance Feeding assistance: Independent Dressing Assistance: Limited assistance     Functional Limitations Info  Sight, Hearing, Speech Sight Info: Impaired Hearing Info: Adequate Speech Info: Adequate    SPECIAL CARE FACTORS FREQUENCY  PT (By licensed PT), OT (By licensed OT)     PT Frequency: 5x week OT Frequency: 5x week            Contractures Contractures Info: Not present    Additional Factors Info  Code Status, Allergies Code Status Info: full Allergies Info: NKA           Current Medications (04/22/2023):  This is the current hospital active medication list Current Facility-Administered Medications  Medication Dose Route Frequency Provider Last Rate Last Admin  0.9 %  sodium chloride infusion   Intravenous Continuous Briant Cedar, MD 100 mL/hr at 04/20/23 2236 New Bag at 04/20/23 2236   acetaminophen (TYLENOL) tablet 325-650 mg  325-650 mg Oral Q6H PRN Swinteck, Arlys John, MD       allopurinol (ZYLOPRIM) tablet 100 mg  100 mg Oral Daily Swinteck, Arlys John, MD   100 mg at 04/22/23 0841   alum & mag hydroxide-simeth (MAALOX/MYLANTA) 200-200-20 MG/5ML suspension 30 mL   30 mL Oral Q4H PRN Swinteck, Arlys John, MD       aspirin chewable tablet 81 mg  81 mg Oral BID Samson Frederic, MD   81 mg at 04/22/23 0841   atorvastatin (LIPITOR) tablet 40 mg  40 mg Oral QPM Swinteck, Arlys John, MD   40 mg at 04/21/23 1745   ceFAZolin (ANCEF) IVPB 2g/100 mL premix  2 g Intravenous Q8H Swinteck, Arlys John, MD 200 mL/hr at 04/22/23 0330 2 g at 04/22/23 0330   Chlorhexidine Gluconate Cloth 2 % PADS 6 each  6 each Topical Daily Briant Cedar, MD   6 each at 04/21/23 0835   diphenhydrAMINE (BENADRYL) 12.5 MG/5ML elixir 12.5-25 mg  12.5-25 mg Oral Q4H PRN Swinteck, Arlys John, MD       docusate sodium (COLACE) capsule 100 mg  100 mg Oral BID Samson Frederic, MD   100 mg at 04/22/23 0841   feeding supplement (ENSURE ENLIVE / ENSURE PLUS) liquid 237 mL  237 mL Oral TID BM Swinteck, Arlys John, MD   237 mL at 04/22/23 0845   feeding supplement (ENSURE PRE-SURGERY) liquid 296 mL  296 mL Oral 120 min pre-op Samson Frederic, MD       fluticasone (FLONASE) 50 MCG/ACT nasal spray 2 spray  2 spray Each Nare Daily PRN Swinteck, Arlys John, MD       hydrALAZINE (APRESOLINE) injection 5 mg  5 mg Intravenous Q6H PRN Swinteck, Arlys John, MD       HYDROcodone-acetaminophen (NORCO) 7.5-325 MG per tablet 1-2 tablet  1-2 tablet Oral Q4H PRN Samson Frederic, MD   2 tablet at 04/21/23 0417   HYDROcodone-acetaminophen (NORCO/VICODIN) 5-325 MG per tablet 1-2 tablet  1-2 tablet Oral Q6H PRN Samson Frederic, MD   2 tablet at 04/19/23 0432   menthol-cetylpyridinium (CEPACOL) lozenge 3 mg  1 lozenge Oral PRN Swinteck, Arlys John, MD       Or   phenol (CHLORASEPTIC) mouth spray 1 spray  1 spray Mouth/Throat PRN Swinteck, Arlys John, MD       methocarbamol (ROBAXIN) tablet 500 mg  500 mg Oral Q8H PRN Briant Cedar, MD   500 mg at 04/21/23 2130   omega-3 acid ethyl esters (LOVAZA) capsule 1 g  1 g Oral Daily Swinteck, Arlys John, MD   1 g at 04/22/23 1000   ondansetron (ZOFRAN) tablet 4 mg  4 mg Oral Q6H PRN Swinteck, Arlys John, MD       Or    ondansetron (ZOFRAN) injection 4 mg  4 mg Intravenous Q6H PRN Swinteck, Arlys John, MD       pantoprazole (PROTONIX) EC tablet 40 mg  40 mg Oral Daily Briant Cedar, MD   40 mg at 04/22/23 0841   polyethylene glycol (MIRALAX / GLYCOLAX) packet 17 g  17 g Oral Daily PRN Samson Frederic, MD       senna (SENOKOT) tablet 8.6 mg  1 tablet Oral BID Samson Frederic, MD   8.6 mg at 04/22/23 0841   senna-docusate (Senokot-S) tablet 1 tablet  1 tablet Oral QHS PRN Samson Frederic, MD  sodium chloride flush (NS) 0.9 % injection 10-40 mL  10-40 mL Intracatheter PRN Swinteck, Arlys John, MD       tamsulosin [facility]) capsule 0.4 mg  0.4 mg Oral Daily Samson Frederic, MD   0.4 mg at 04/22/23 2952     Discharge Medications: Please see discharge summary for a list of discharge medications.  Relevant Imaging Results:  Relevant Lab Results:   Additional Information SSN: [id number].  Still on IV abx until 9/22  Lorri Frederick, LCSW

## 2023-04-22 NOTE — TOC Benefit Eligibility Note (Signed)
Patient Product/process development scientist completed.    The patient is insured through [employer]. Patient has Medicare and is not eligible for a copay card, but may be able to apply for patient assistance, if available.    Ran test claim for linezolid (Zyvox) 600 mg Requires Prior Authorization   This test claim was processed through [facility]- copay amounts may vary at other pharmacies due to pharmacy/plan contracts, or as the patient moves through the different stages of their insurance plan.     Roland Earl, CPHT Pharmacy Technician III Certified Patient Advocate [facility] Patient Advocate Team Direct Number: [phone]  Fax: [phone]

## 2023-04-22 NOTE — TOC Initial Note (Signed)
Transition of Care [facility]) - Initial/Assessment Note    Patient Details  Name: Dean Mayer MRN: [id number] Date of Birth: May 23, 1941  Transition of Care [facility]) CM/SW Contact:    Lorri Frederick, LCSW Phone Number: 04/22/2023, 10:43 AM  Clinical Narrative:   CSW spoke with pt and wife Boneta Lucks regarding DC recommendation for SNF.  Pt admitted from [facility] and was nearing DC when had this fall.  They are agreeable to return t SNF and do want to return to [facility].  Copay days discussed, they briefly talked about DC home but decided SNF is better plan.  CSW confirmed with Kelly/Piney Lucas Mallow that pt can return.                Expected Discharge Plan: Skilled Nursing Facility Barriers to Discharge: Continued Medical Work up   Patient Goals and CMS Choice Patient states their goals for this hospitalization and ongoing recovery are:: get back home CMS Medicare.gov Compare Post Acute Care list provided to:: Patient Choice offered to / list presented to : Patient, Spouse      Expected Discharge Plan and Services In-house Referral: Clinical Social Work   Post Acute Care Choice: Skilled Nursing Facility Living arrangements for the past 2 months: Single Family Home                 DME Arranged: N/A DME Agency: NA                  Prior Living Arrangements/Services Living arrangements for the past 2 months: Single Family Home Lives with:: Spouse Patient language and need for interpreter reviewed:: Yes Do you feel safe going back to the place where you live?: Yes      Need for Family Participation in Patient Care: Yes (Comment) Care giver support system in place?: Yes (comment) Current home services: Other (comment) (none) Criminal Activity/Legal Involvement Pertinent to Current Situation/Hospitalization: No - Comment as needed  Activities of Daily Living Home Assistive Devices/Equipment: Walker (specify type) ADL Screening (condition at time of  admission) Patient's cognitive ability adequate to safely complete daily activities?: Yes Is the patient deaf or have difficulty hearing?: No Does the patient have difficulty seeing, even when wearing glasses/contacts?: No Does the patient have difficulty concentrating, remembering, or making decisions?: No Patient able to express need for assistance with ADLs?: Yes Does the patient have difficulty dressing or bathing?: Yes Independently performs ADLs?: Yes (appropriate for developmental age) Does the patient have difficulty walking or climbing stairs?: Yes Weakness of Legs: Left Weakness of Arms/Hands: None  Permission Sought/Granted Permission sought to share information with : Family Supports Permission granted to share information with : Yes, Verbal Permission Granted  Share Information with NAME: wife Boneta Lucks  Permission granted to share info w AGENCY: SNF        Emotional Assessment Appearance:: Appears stated age Attitude/Demeanor/Rapport: Engaged Affect (typically observed): Pleasant, Appropriate Orientation: : Oriented to Self, Oriented to Place, Oriented to  Time, Oriented to Situation Alcohol / Substance Use: Not Applicable Psych Involvement: No (comment)  Admission diagnosis:  Dizziness [R42] Left displaced femoral neck fracture (HCC) [S72.002A] Fall, initial encounter [W19.XXXA] Closed left hip fracture, initial encounter [facility]) [S72.002A] Patient Active Problem List   Diagnosis Date Noted   Protein-calorie malnutrition, severe 04/19/2023   Left displaced femoral neck fracture (HCC) 04/17/2023   Septic arthritis of knee (HCC) 04/10/2023   Painful knee 03/21/2023   MSSA bacteremia 03/18/2023   Acute gout of right ankle 03/18/2023  Bacterial infection of knee joint (HCC) 03/15/2023   Swelling of right foot 02/20/2023   Sedimentation rate elevation 02/20/2023   Positive ANA (antinuclear antibody) 02/20/2023   Peripheral arterial disease (HCC) 12/04/2022    Cellulitis of right lower extremity 12/03/2022   Peripheral edema 12/02/2022   Cellulitis of right leg 12/01/2022   Hypertension 12/01/2022   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   Dyspnea on exertion 12/01/2022   Hypothyroidism 12/01/2022   Carotid stenosis 08/29/2021   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   Malignant neoplasm of prostate (HCC) 05/28/2018   Primary open angle glaucoma (POAG) of right eye, severe stage 09/16/2017   Lacunar infarct, acute (HCC) 01/05/2016   Confusion 01/04/2016   Hypertensive urgency 01/04/2016   Left-sided weakness 01/04/2016   Stroke (HCC) 01/04/2016   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   Basal cell carcinoma of left eyelid 05/09/2015   Carcinoma of left eyelid 04/25/2015   Acne rosacea 07/26/2014   Eyelid lesion 07/26/2014   Primary open angle glaucoma of both eyes 02/23/2013   Visual field defect 02/23/2013   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   PCP:  Angelique Blonder, MD Pharmacy:   [facility] - [place], [place] - [address] [place] [address] Phone: [phone] Fax: [phone]  [facility] - [place], [place] - [address] [place] [address] Phone: [phone] Fax: [phone]     Social Determinants of Health (SDOH) Social History: SDOH Screenings   Food Insecurity: No Food Insecurity (04/17/2023)  Housing: Low Risk  (04/17/2023)  Transportation Needs: No Transportation Needs (04/17/2023)  Utilities: Not At Risk (04/17/2023)  Financial Resource Strain: Low Risk  (11/19/2021)   Received from [facility]  Physical Activity: Inactive (11/19/2021)   Received from [facility], [facility]  Social Connections: Unknown (11/26/2022)   Received from [facility], [facility]  Stress: No Stress Concern Present (03/16/2022)   [facility] from [facility],  [facility]  Tobacco Use: Medium Risk (04/19/2023)   SDOH Interventions:     Readmission Risk Interventions    04/18/2023    9:55 AM  Readmission Risk Prevention Plan  Transportation Screening Complete  Medication Review (RN Care Manager) Complete  HRI or Home Care Consult Complete  SW Recovery Care/Counseling Consult Complete  Palliative Care Screening Not Applicable  Skilled Nursing Facility Not Complete  SNF Comments Awaiting PT evaluation following surgery.

## 2023-04-22 NOTE — Plan of Care (Signed)
  Problem: Education: Goal: Knowledge of General Education information will improve Description: Including pain rating scale, medication(s)/side effects and non-pharmacologic comfort measures Outcome: Not Progressing   Problem: Health Behavior/Discharge Planning: Goal: Ability to manage health-related needs will improve Outcome: Not Progressing   Problem: Clinical Measurements: Goal: Ability to maintain clinical measurements within normal limits will improve Outcome: Not Progressing Goal: Will remain free from infection Outcome: Not Progressing Goal: Diagnostic test results will improve Outcome: Not Progressing Goal: Respiratory complications will improve Outcome: Not Progressing Goal: Cardiovascular complication will be avoided Outcome: Not Progressing   Problem: Activity: Goal: Risk for activity intolerance will decrease Outcome: Not Progressing   Problem: Nutrition: Goal: Adequate nutrition will be maintained Outcome: Not Progressing   Problem: Coping: Goal: Level of anxiety will decrease Outcome: Not Progressing   Problem: Elimination: Goal: Will not experience complications related to bowel motility Outcome: Not Progressing Goal: Will not experience complications related to urinary retention Outcome: Not Progressing   Problem: Pain Managment: Goal: General experience of comfort will improve Outcome: Not Progressing   Problem: Safety: Goal: Ability to remain free from injury will improve Outcome: Not Progressing   Problem: Skin Integrity: Goal: Risk for impaired skin integrity will decrease Outcome: Not Progressing   Problem: Education: Goal: Verbalization of understanding the information provided (i.e., activity precautions, restrictions, etc) will improve Outcome: Not Progressing Goal: Individualized Educational Video(s) Outcome: Not Progressing   Problem: Activity: Goal: Ability to ambulate and perform ADLs will improve Outcome: Not Progressing    Problem: Clinical Measurements: Goal: Postoperative complications will be avoided or minimized Outcome: Not Progressing   Problem: Self-Concept: Goal: Ability to maintain and perform role responsibilities to the fullest extent possible will improve Outcome: Not Progressing   Problem: Pain Management: Goal: Pain level will decrease Outcome: Not Progressing   Problem: Education: Goal: Knowledge of the prescribed therapeutic regimen will improve Outcome: Not Progressing Goal: Understanding of discharge needs will improve Outcome: Not Progressing Goal: Individualized Educational Video(s) Outcome: Not Progressing   Problem: Activity: Goal: Ability to avoid complications of mobility impairment will improve Outcome: Not Progressing Goal: Ability to tolerate increased activity will improve Outcome: Not Progressing   Problem: Clinical Measurements: Goal: Postoperative complications will be avoided or minimized Outcome: Not Progressing   Problem: Pain Management: Goal: Pain level will decrease with appropriate interventions Outcome: Not Progressing   Problem: Skin Integrity: Goal: Will show signs of wound healing Outcome: Not Progressing

## 2023-04-22 NOTE — Plan of Care (Signed)
  Problem: Education: Goal: Knowledge of General Education information will improve Description Including pain rating scale, medication(s)/side effects and non-pharmacologic comfort measures Outcome: Progressing   Problem: Clinical Measurements: Goal: Will remain free from infection Outcome: Progressing   Problem: Activity: Goal: Risk for activity intolerance will decrease Outcome: Progressing   

## 2023-04-22 NOTE — Care Management Important Message (Signed)
Important Message  Patient Details  Name: CHANDLAR COBBLE MRN: [id number] Date of Birth: 02-04-1941   Medicare Important Message Given:  Yes     Sherilyn Banker 04/22/2023, 1:09 PM

## 2023-04-22 NOTE — Telephone Encounter (Signed)
Pharmacy Patient Advocate Encounter   Received notification that prior authorization for Linezolid 600MG  tablets is required/requested.   Insurance verification completed.   The patient is insured through [employer] [facility] .   Per test claim: PA required; PA submitted to [employer] [facility] via CoverMyMeds Key/confirmation #/EOC [id number] Status is pending

## 2023-04-22 NOTE — Progress Notes (Signed)
PROGRESS NOTE  Dean Mayer ZOX:[id number] DOB: 09/18/1940 DOA: 04/17/2023 PCP: Angelique Blonder, MD  HPI/Recap of past 82 hours: 82 year old man PMH of throat cancer, gout with recent hospitalization for MSSA bacteremia and left knee septic arthritis with discharge 8/21, with plan for IV antibiotics through September 22 followed by Dr. Algis Liming, presented 9/11 after a fall resulting in left hip fracture. Fall occurred in the context of a dizzy spell, has had multiple dizzy spells over the last couple of weeks as well as significant weight loss since developing knee pain.  Orthopedics consulted.  Patient had surgery/L THA on 9/13.    Today, patient denies any new complaints.    Assessment/Plan: Principal Problem:   Left displaced femoral neck fracture (HCC) Active Problems:   Hypertension   Hypothyroidism   BPH with obstruction/lower urinary tract symptoms   Septic arthritis of knee (HCC)   Protein-calorie malnutrition, severe   Left femoral neck fracture S/p THA on 04/19/23 PT/OT- rec SNF Further management by orthopedics   Dizzy spells/presyncope Likely 2/2 orthostatic hypotension with soft BP Poor oral intake/poor appetite with recent weight loss and on BP meds EKG unremarkable Last echo done in 03/2023 showed EF of 65 to 70%, no regional wall motion abnormality Carotid ultrasound showed left ICA 1 to 39% stenosis, patent R ICA stent noted D/C IVF, hold all BP meds for now and plan to adjust pending BP readings Fall precautions PT/OT  ABLA Mild normocytic anemia Hemoglobin baseline 11-12, now down to 8.2 Anemia panel iron 28, Vit B12-->385 No signs of bleeding, likely from recent surgery Oral iron and vit B12 supplementation Daily CBC  MSSA bacteremia  MSSA septic arthritis left knee Plan is for 6 weeks of IV Ancef with PICC line until April 28, 2023 ID on board, rec repeat TTE in the setting of MSSA bacteremia Repeat TTE with no evidence of valvular  vegetations S/p Ancef--->switch to linezolid to complete 04/28/23   History of throat cancer in 2013 s/p XRT Carotid art stent on rt following radiation associated narrowing abt 3 years ago Plan for annual visit at [facility] in November   Unintentional weight loss ~40 lb since April 2024 RD consult for wt loss and loss of appetite Further workup as OP   Abnormal CT angio abdomen pelvis back in June 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 Sherral Hammers from vascular, he reviewed his chart and previous CT scan, advised no further inpatient workup at this time and needs outpatient follow-up   Stable small bilateral pulmonary nodules up to 5 mm in June 2024  Recommendation was for 12 months follow-up if high risk   Abnormal ABI 12/03/2022- non compressible vessels   Chronic back pain Pain management   Prediabetes Recent A1c 5.9 CBG checks and sliding scale insulin   Hypothyroidism Continue Synthroid   Essential hypertension Hold amlodipine and lisinopril as noted above   Hyperlipidemia Continue atorvastatin   BPH Continue Flomax      Malnutrition Type:  Nutrition Problem: Severe Malnutrition Etiology: acute illness   Malnutrition Characteristics:  Signs/Symptoms: moderate fat depletion, moderate muscle depletion, percent weight loss Percent weight loss: 21 %   Nutrition Interventions:  Interventions: Ensure Enlive (each supplement provides 350kcal and 20 grams of protein), Refer to RD note for recommendations, Magic cup, Education    Estimated body mass index is 24.34 kg/m as calculated from the following:   Height as of this encounter: 6\' 4"  (1.93  m).   Weight as of this encounter: 90.7 kg.     Code Status: Full  Family Communication: None at bedside  Disposition Plan: Status is: Inpatient Remains inpatient appropriate because: Level of  care      Consultants: Orthopedics ID  Procedures: THA on 9/13  Antimicrobials: Linezolid   DVT prophylaxis:  ASA 81 BID   Objective: Vitals:   04/21/23 1315 04/21/23 2125 04/22/23 0752 04/22/23 1351  BP: (!) 136/55 (!) 154/65 (!) 174/71 131/72  Pulse: 79 89 78 82  Resp: 18 16 17 18   Temp: 98.4 F (36.9 C) 98.7 F (37.1 C) 98.6 F (37 C) 98.8 F (37.1 C)  TempSrc: Oral Oral Oral Oral  SpO2: 97% 98% 97% 98%  Weight:      Height:        Intake/Output Summary (Last 24 hours) at 04/22/2023 1722 Last data filed at 04/22/2023 1500 Gross per 24 hour  Intake 660 ml  Output 3115 ml  Net -2455 ml   Filed Weights   04/17/23 1743  Weight: 90.7 kg    Exam: General: NAD  Cardiovascular: S1, S2 present Respiratory: CTAB Abdomen: Soft, nontender, nondistended, bowel sounds present Musculoskeletal: No bilateral pedal edema noted, dressing on LLE intact/clean Skin: Normal Psychiatry: Normal mood     Data Reviewed: CBC: Recent Labs  Lab 04/17/23 1449 04/18/23 0150 04/21/23 0414 04/22/23 1154  WBC 4.6 3.1* 4.2 4.6  NEUTROABS 3.3  --   --  3.3  HGB 11.3* 11.4* 8.2* 8.9*  HCT 36.0* 36.0* 26.1* 28.3*  MCV 88.0 87.4 89.7 87.6  PLT 243 234 199 239   Basic Metabolic Panel: Recent Labs  Lab 04/17/23 1449 04/18/23 0150 04/21/23 0414 04/22/23 1154  NA 135 131* 136 134*  K 3.6 4.0 3.9 3.5  CL 100 98 105 100  CO2 26 23 25 27   GLUCOSE 119* 161* 116* 142*  BUN 9 10 17 10   CREATININE 0.63 0.66 0.64 0.60*  CALCIUM 8.7* 8.9 8.0* 8.2*   GFR: Estimated Creatinine Clearance: 90.4 mL/min (A) (by C-G formula based on SCr of 0.6 mg/dL (L)). Liver Function Tests: No results for input(s): "AST", "ALT", "ALKPHOS", "BILITOT", "PROT", "ALBUMIN" in the last 168 hours. No results for input(s): "LIPASE", "AMYLASE" in the last 168 hours. No results for input(s): "AMMONIA" in the last 168 hours. Coagulation Profile: Recent Labs  Lab 04/17/23 1449  INR 1.1   Cardiac  Enzymes: No results for input(s): "CKTOTAL", "CKMB", "CKMBINDEX", "TROPONINI" in the last 168 hours. BNP (last 3 results) No results for input(s): "PROBNP" in the last 8760 hours. HbA1C: No results for input(s): "HGBA1C" in the last 72 hours. CBG: No results for input(s): "GLUCAP" in the last 168 hours. Lipid Profile: No results for input(s): "CHOL", "HDL", "LDLCALC", "TRIG", "CHOLHDL", "LDLDIRECT" in the last 72 hours. Thyroid Function Tests: No results for input(s): "TSH", "T4TOTAL", "FREET4", "T3FREE", "THYROIDAB" in the last 72 hours. Anemia Panel: Recent Labs    04/22/23 1154  VITAMINB12 385  FOLATE 10.3  FERRITIN 217  TIBC 148*  IRON 28*   Urine analysis:    Component Value Date/Time   COLORURINE YELLOW 03/22/2023 2153   APPEARANCEUR HAZY (A) 03/22/2023 2153   LABSPEC 1.013 03/22/2023 2153   PHURINE 7.0 03/22/2023 2153   GLUCOSEU NEGATIVE 03/22/2023 2153   HGBUR NEGATIVE 03/22/2023 2153   BILIRUBINUR NEGATIVE 03/22/2023 2153   KETONESUR 20 (A) 03/22/2023 2153   PROTEINUR NEGATIVE 03/22/2023 2153   UROBILINOGEN 1.0 01/06/2012 1132   NITRITE NEGATIVE 03/22/2023  2153   LEUKOCYTESUR NEGATIVE 03/22/2023 2153   Sepsis Labs: @LABRCNTIP (procalcitonin:4,lacticidven:4)  ) Recent Results (from the past 240 hour(s))  Surgical pcr screen     Status: None   Collection Time: 04/18/23  9:46 PM   Specimen: Nasal Mucosa; Nasal Swab  Result Value Ref Range Status   MRSA, PCR NEGATIVE NEGATIVE Final   Staphylococcus aureus NEGATIVE NEGATIVE Final    Comment: (NOTE) The Xpert SA Assay (FDA approved for NASAL specimens in patients 73 years of age and older), is one component of a comprehensive surveillance program. It is not intended to diagnose infection nor to guide or monitor treatment. Performed at [facility], [address], [place], [place] [address]       Studies: No results found.  Scheduled Meds:  allopurinol  100 mg Oral Daily   aspirin  81 mg Oral  BID   atorvastatin  40 mg Oral QPM   Chlorhexidine Gluconate Cloth  6 each Topical Daily   docusate sodium  100 mg Oral BID   feeding supplement  237 mL Oral TID BM   feeding supplement  296 mL Oral 120 min pre-op   linezolid  600 mg Oral Q12H   omega-3 acid ethyl esters  1 g Oral Daily   pantoprazole  40 mg Oral Daily   senna  1 tablet Oral BID   tamsulosin  0.4 mg Oral Daily    Continuous Infusions:  sodium chloride 100 mL/hr at 04/20/23 2236     LOS: 5 days     Briant Cedar, MD [facility]  If 7PM-7AM, please contact night-coverage [email or web address] 04/22/2023, 5:22 PM

## 2023-04-23 DIAGNOSIS — S72002A Fracture of unspecified part of neck of left femur, initial encounter for closed fracture: Secondary | ICD-10-CM | POA: Diagnosis not present

## 2023-04-23 LAB — CBC WITH DIFFERENTIAL/PLATELET
Abs Immature Granulocytes: 0.04 10*3/uL (ref 0.00–0.07)
Basophils Absolute: 0 10*3/uL (ref 0.0–0.1)
Basophils Relative: 1 %
Eosinophils Absolute: 0.1 10*3/uL (ref 0.0–0.5)
Eosinophils Relative: 2 %
HCT: 28.3 % — ABNORMAL LOW (ref 39.0–52.0)
Hemoglobin: 9 g/dL — ABNORMAL LOW (ref 13.0–17.0)
Immature Granulocytes: 1 %
Lymphocytes Relative: 21 %
Lymphs Abs: 0.8 10*3/uL (ref 0.7–4.0)
MCH: 28.5 pg (ref 26.0–34.0)
MCHC: 31.8 g/dL (ref 30.0–36.0)
MCV: 89.6 fL (ref 80.0–100.0)
Monocytes Absolute: 0.3 10*3/uL (ref 0.1–1.0)
Monocytes Relative: 6 %
Neutro Abs: 2.7 10*3/uL (ref 1.7–7.7)
Neutrophils Relative %: 69 %
Platelets: 226 10*3/uL (ref 150–400)
RBC: 3.16 MIL/uL — ABNORMAL LOW (ref 4.22–5.81)
RDW: 14.4 % (ref 11.5–15.5)
WBC: 3.9 10*3/uL — ABNORMAL LOW (ref 4.0–10.5)
nRBC: 0 % (ref 0.0–0.2)

## 2023-04-23 LAB — BASIC METABOLIC PANEL
Anion gap: 5 (ref 5–15)
BUN: 10 mg/dL (ref 8–23)
CO2: 29 mmol/L (ref 22–32)
Calcium: 8.4 mg/dL — ABNORMAL LOW (ref 8.9–10.3)
Chloride: 100 mmol/L (ref 98–111)
Creatinine, Ser: 0.65 mg/dL (ref 0.61–1.24)
GFR, Estimated: >60 mL/min (ref >60)
Glucose, Bld: 146 mg/dL — ABNORMAL HIGH (ref 70–99)
Potassium: 3.7 mmol/L (ref 3.5–5.1)
Sodium: 134 mmol/L — ABNORMAL LOW (ref 135–145)

## 2023-04-23 MED ORDER — MAGNESIUM CITRATE PO SOLN
1.0000 | Freq: Once | ORAL | Status: AC
  Administered 2023-04-23: 1 via ORAL
  Filled 2023-04-23: qty 296

## 2023-04-23 NOTE — Progress Notes (Signed)
Physical Therapy Treatment Patient Details Name: Dean Mayer MRN: [id number] DOB: 06/12/1941 Today's Date: 04/23/2023   History of Present Illness Pt is an 82 y.o. male who presented 04/17/23 s/p fall at [facility]. Imaging revealed L hip fx. S/p L THA 9/13. Of note, pt recently admitted 8/14 -8/21 with left knee meniscal tear-managed without surgery and previously hospitalized 8/8 -8/14 for MSSA left knee septic arthritis. PMH includes HTN, throat and prostate CA.    PT Comments  Pt tolerated treatment well today. Pt was able to transfer to chair today with RW +2 Min A and no reports of dizziness. No change in DC/DME recs at this time. PT will continue to follow.    If plan is discharge home, recommend the following: A lot of help with walking and/or transfers;A lot of help with bathing/dressing/bathroom;Assistance with cooking/housework;Assist for transportation;Help with stairs or ramp for entrance   Can travel by private vehicle     No  Equipment Recommendations  Other (comment) (Per accepting facility)    Recommendations for Other Services OT consult     Precautions / Restrictions Precautions Precautions: Fall Precaution Comments: watch BP (orthostatic) Restrictions Weight Bearing Restrictions: Yes LLE Weight Bearing: Weight bearing as tolerated     Mobility  Bed Mobility Overal bed mobility: Needs Assistance Bed Mobility: Supine to Sit     Supine to sit: Mod assist, HOB elevated, Used rails     General bed mobility comments: Increased time with incremental assistance in managing L leg towards and off R EOB, using bed pad to pivot hips    Transfers Overall transfer level: Needs assistance Equipment used: Rolling walker (2 wheels) Transfers: Sit to/from Stand, Bed to chair/wheelchair/BSC Sit to Stand: From elevated surface, +2 physical assistance, Min assist Stand pivot transfers: +2 physical assistance, Min assist         General transfer comment: no dizziness  or overt LOB today. Pt eager to get to chair. Blankets placed in chair to allow for easier sit to stand when returning to bed.    Ambulation/Gait               General Gait Details: Deferred for safety.   Stairs             Wheelchair Mobility     Tilt Bed    Modified Rankin (Stroke Patients Only)       Balance Overall balance assessment: Needs assistance Sitting-balance support: No upper extremity supported, Feet supported Sitting balance-Leahy Scale: Fair     Standing balance support: Bilateral upper extremity supported, During functional activity, Reliant on assistive device for balance Standing balance-Leahy Scale: Poor Standing balance comment: Reliant on RW                            Cognition Arousal: Alert Behavior During Therapy: WFL for tasks assessed/performed Overall Cognitive Status: Within Functional Limits for tasks assessed                                 General Comments: very pleasant and conversational        Exercises      General Comments General comments (skin integrity, edema, etc.): VSS      Pertinent Vitals/Pain Pain Assessment Pain Assessment: Faces Faces Pain Scale: Hurts little more Pain Location: L hip and knee Pain Descriptors / Indicators: Discomfort, Guarding, Grimacing, Operative site guarding Pain Intervention(s): Monitored  during session, Limited activity within patient's tolerance, Repositioned    Home Living                          Prior Function            PT Goals (current goals can now be found in the care plan section) Progress towards PT goals: Progressing toward goals    Frequency    Min 1X/week      PT Plan      Co-evaluation              AM-PAC PT "6 Clicks" Mobility   Outcome Measure  Help needed turning from your back to your side while in a flat bed without using bedrails?: A Little Help needed moving from lying on your back to sitting  on the side of a flat bed without using bedrails?: A Lot Help needed moving to and from a bed to a chair (including a wheelchair)?: A Lot Help needed standing up from a chair using your arms (e.g., wheelchair or bedside chair)?: A Lot Help needed to walk in hospital room?: A Lot Help needed climbing 3-5 steps with a railing? : Total 6 Click Score: 12    End of Session Equipment Utilized During Treatment: Gait belt Activity Tolerance: Patient tolerated treatment well Patient left: in bed;with call bell/phone within reach Nurse Communication: Mobility status;Need for lift equipment PT Visit Diagnosis: Other abnormalities of gait and mobility (R26.89);Muscle weakness (generalized) (M62.81);Pain;Unsteadiness on feet (R26.81);Difficulty in walking, not elsewhere classified (R26.2);Dizziness and giddiness (R42) Pain - Right/Left: Left Pain - part of body: Hip     Time: 0865-7846 PT Time Calculation (min) (ACUTE ONLY): 17 min  Charges:    $Therapeutic Activity: 8-22 mins PT General Charges $$ ACUTE PT VISIT: 1 Visit                     Shela Nevin, PT, DPT Acute Rehab Services [phone]    [name] 04/23/2023, 3:26 PM

## 2023-04-23 NOTE — TOC Progression Note (Signed)
Transition of Care [facility]) - Progression Note    Patient Details  Name: Dean Mayer MRN: [id number] Date of Birth: January 25, 1941  Transition of [facility]) CM/SW Contact  Lorri Frederick, LCSW Phone Number: 04/23/2023, 10:09 AM  Clinical Narrative:   SNF Auth remains pending with [employer].     Expected Discharge Plan: Skilled Nursing Facility Barriers to Discharge: Continued Medical Work up  Expected Discharge Plan and Services In-house Referral: Clinical Social Work   Post Acute Care Choice: Skilled Nursing Facility Living arrangements for the past 2 months: Single Family Home                 DME Arranged: N/A DME Agency: NA                   Social Determinants of Health (SDOH) Interventions SDOH Screenings   Food Insecurity: No Food Insecurity (04/17/2023)  Housing: Low Risk  (04/17/2023)  Transportation Needs: No Transportation Needs (04/17/2023)  Utilities: Not At Risk (04/17/2023)  Financial Resource Strain: Low Risk  (11/19/2021)   Received from [facility], [facility]  Physical Activity: Inactive (11/19/2021)   Received from [facility], [facility]  Social Connections: Unknown (11/26/2022)   Received from [facility], [facility]  Stress: No Stress Concern Present (03/16/2022)   Received from [facility], [facility]  Tobacco Use: Medium Risk (04/19/2023)    Readmission Risk Interventions    04/18/2023    9:55 AM  Readmission Risk Prevention Plan  Transportation Screening Complete  Medication Review (RN Care Manager) Complete  HRI or Home Care Consult Complete  SW Recovery Care/Counseling Consult Complete  Palliative Care Screening Not Applicable  Skilled Nursing Facility Not Complete  SNF Comments Awaiting PT evaluation following surgery.

## 2023-04-23 NOTE — Plan of Care (Signed)
  Problem: Education: Goal: Knowledge of General Education information will improve Description: Including pain rating scale, medication(s)/side effects and non-pharmacologic comfort measures Outcome: Not Progressing   Problem: Health Behavior/Discharge Planning: Goal: Ability to manage health-related needs will improve Outcome: Not Progressing   Problem: Clinical Measurements: Goal: Ability to maintain clinical measurements within normal limits will improve Outcome: Not Progressing Goal: Will remain free from infection Outcome: Not Progressing Goal: Diagnostic test results will improve Outcome: Not Progressing Goal: Respiratory complications will improve Outcome: Not Progressing Goal: Cardiovascular complication will be avoided Outcome: Not Progressing   Problem: Activity: Goal: Risk for activity intolerance will decrease Outcome: Not Progressing   Problem: Nutrition: Goal: Adequate nutrition will be maintained Outcome: Not Progressing   Problem: Coping: Goal: Level of anxiety will decrease Outcome: Not Progressing   Problem: Elimination: Goal: Will not experience complications related to bowel motility Outcome: Not Progressing Goal: Will not experience complications related to urinary retention Outcome: Not Progressing   Problem: Pain Managment: Goal: General experience of comfort will improve Outcome: Not Progressing   Problem: Safety: Goal: Ability to remain free from injury will improve Outcome: Not Progressing   Problem: Skin Integrity: Goal: Risk for impaired skin integrity will decrease Outcome: Not Progressing   Problem: Education: Goal: Verbalization of understanding the information provided (i.e., activity precautions, restrictions, etc) will improve Outcome: Not Progressing Goal: Individualized Educational Video(s) Outcome: Not Progressing   Problem: Activity: Goal: Ability to ambulate and perform ADLs will improve Outcome: Not Progressing    Problem: Clinical Measurements: Goal: Postoperative complications will be avoided or minimized Outcome: Not Progressing   Problem: Self-Concept: Goal: Ability to maintain and perform role responsibilities to the fullest extent possible will improve Outcome: Not Progressing   Problem: Pain Management: Goal: Pain level will decrease Outcome: Not Progressing   Problem: Education: Goal: Knowledge of the prescribed therapeutic regimen will improve Outcome: Not Progressing Goal: Understanding of discharge needs will improve Outcome: Not Progressing Goal: Individualized Educational Video(s) Outcome: Not Progressing   Problem: Activity: Goal: Ability to avoid complications of mobility impairment will improve Outcome: Not Progressing Goal: Ability to tolerate increased activity will improve Outcome: Not Progressing   Problem: Clinical Measurements: Goal: Postoperative complications will be avoided or minimized Outcome: Not Progressing   Problem: Pain Management: Goal: Pain level will decrease with appropriate interventions Outcome: Not Progressing   Problem: Skin Integrity: Goal: Will show signs of wound healing Outcome: Not Progressing

## 2023-04-23 NOTE — Progress Notes (Signed)
PROGRESS NOTE  Dean Mayer ZOX:[id number] DOB: Aug 16, 1940 DOA: 04/17/2023 PCP: Angelique Blonder, MD  HPI/Recap of past 11 hours: 82 year old man PMH of throat cancer, gout with recent hospitalization for MSSA bacteremia and left knee septic arthritis with discharge 8/21, with plan for IV antibiotics through September 22 followed by Dr. Algis Liming, presented 9/11 after a fall resulting in left hip fracture. Fall occurred in the context of a dizzy spell, has had multiple dizzy spells over the last couple of weeks as well as significant weight loss since developing knee pain.  Orthopedics consulted.  Patient had surgery/L THA on 9/13.    Today, no new complaints. No BM since admission    Assessment/Plan: Principal Problem:   Left displaced femoral neck fracture (HCC) Active Problems:   Hypertension   Hypothyroidism   BPH with obstruction/lower urinary tract symptoms   Septic arthritis of knee (HCC)   Protein-calorie malnutrition, severe   Left femoral neck fracture S/p THA on 04/19/23 PT/OT- rec SNF Further management by orthopedics   Dizzy spells/presyncope Likely 2/2 orthostatic hypotension (noted to be +) Poor oral intake/poor appetite with recent weight loss and on BP meds EKG unremarkable Last echo done in 03/2023 showed EF of 65 to 70%, no regional wall motion abnormality Carotid ultrasound showed left ICA 1 to 39% stenosis, patent R ICA stent noted D/C IVF, hold all BP meds for now and plan to adjust pending BP readings Fall precautions PT/OT- SNF  ABLA Mild normocytic anemia Hemoglobin baseline 11-12 Anemia panel iron 28, Vit B12-->385 No signs of bleeding, likely from recent surgery Oral iron and vit B12 supplementation Daily CBC  MSSA bacteremia  MSSA septic arthritis left knee Plan is for 6 weeks of IV Ancef with PICC line until April 28, 2023 ID on board, rec repeat TTE in the setting of MSSA bacteremia Repeat TTE with no evidence of valvular  vegetations S/p Ancef--->switch to linezolid to complete 04/28/23   History of throat cancer in 2013 s/p XRT Carotid art stent on rt following radiation associated narrowing abt 3 years ago Plan for annual visit at [facility] in November   Unintentional weight loss ~40 lb since April 2024 RD consult for wt loss and loss of appetite Further workup as OP   Abnormal CT angio abdomen pelvis back in June 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 Sherral Hammers from vascular, he reviewed his chart and previous CT scan, advised no further inpatient workup at this time and needs outpatient follow-up   Stable small bilateral pulmonary nodules up to 5 mm in June 2024  Recommendation was for 12 months follow-up if high risk   Abnormal ABI 12/03/2022- non compressible vessels   Chronic back pain Pain management   Prediabetes Recent A1c 5.9 CBG checks and sliding scale insulin   Hypothyroidism Continue Synthroid   Essential hypertension Hold amlodipine and lisinopril as noted above   Hyperlipidemia Continue atorvastatin   BPH Continue Flomax      Malnutrition Type:  Nutrition Problem: Severe Malnutrition Etiology: acute illness   Malnutrition Characteristics:  Signs/Symptoms: moderate fat depletion, moderate muscle depletion, percent weight loss Percent weight loss: 21 %   Nutrition Interventions:  Interventions: Ensure Enlive (each supplement provides 350kcal and 20 grams of protein), Refer to RD note for recommendations, Magic cup, Education    Estimated body mass index is 24.34 kg/m as calculated from the following:   Height as of this encounter: 6\' 4"  (1.93  m).   Weight as of this encounter: 90.7 kg.     Code Status: Full  Family Communication: None at bedside  Disposition Plan: Status is: Inpatient Remains inpatient appropriate because: Level of  care      Consultants: Orthopedics ID  Procedures: THA on 9/13  Antimicrobials: Linezolid   DVT prophylaxis:  ASA 81 BID   Objective: Vitals:   04/22/23 1351 04/22/23 2029 04/23/23 0711 04/23/23 1158  BP: 131/72 132/64 (!) 140/68 (!) 121/57  Pulse: 82 77 73 73  Resp: 18 15  16   Temp: 98.8 F (37.1 C) 98 F (36.7 C) 98.1 F (36.7 C) 97.6 F (36.4 C)  TempSrc: Oral  Oral   SpO2: 98% 98% 97% 97%  Weight:      Height:        Intake/Output Summary (Last 24 hours) at 04/23/2023 1525 Last data filed at 04/23/2023 1158 Gross per 24 hour  Intake 480 ml  Output 1280 ml  Net -800 ml   Filed Weights   04/17/23 1743  Weight: 90.7 kg    Exam: General: NAD  Cardiovascular: S1, S2 present Respiratory: CTAB Abdomen: Soft, nontender, nondistended, bowel sounds present Musculoskeletal: No bilateral pedal edema noted, dressing on LLE intact/clean Skin: Normal Psychiatry: Normal mood     Data Reviewed: CBC: Recent Labs  Lab 04/17/23 1449 04/18/23 0150 04/21/23 0414 04/22/23 1154 04/23/23 1000  WBC 4.6 3.1* 4.2 4.6 3.9*  NEUTROABS 3.3  --   --  3.3 2.7  HGB 11.3* 11.4* 8.2* 8.9* 9.0*  HCT 36.0* 36.0* 26.1* 28.3* 28.3*  MCV 88.0 87.4 89.7 87.6 89.6  PLT 243 234 199 239 226   Basic Metabolic Panel: Recent Labs  Lab 04/17/23 1449 04/18/23 0150 04/21/23 0414 04/22/23 1154 04/23/23 1000  NA 135 131* 136 134* 134*  K 3.6 4.0 3.9 3.5 3.7  CL 100 98 105 100 100  CO2 26 23 25 27 29   GLUCOSE 119* 161* 116* 142* 146*  BUN 9 10 17 10 10   CREATININE 0.63 0.66 0.64 0.60* 0.65  CALCIUM 8.7* 8.9 8.0* 8.2* 8.4*   GFR: Estimated Creatinine Clearance: 90.4 mL/min (by C-G formula based on SCr of 0.65 mg/dL). Liver Function Tests: No results for input(s): "AST", "ALT", "ALKPHOS", "BILITOT", "PROT", "ALBUMIN" in the last 168 hours. No results for input(s): "LIPASE", "AMYLASE" in the last 168 hours. No results for input(s): "AMMONIA" in the last 168  hours. Coagulation Profile: Recent Labs  Lab 04/17/23 1449  INR 1.1   Cardiac Enzymes: No results for input(s): "CKTOTAL", "CKMB", "CKMBINDEX", "TROPONINI" in the last 168 hours. BNP (last 3 results) No results for input(s): "PROBNP" in the last 8760 hours. HbA1C: No results for input(s): "HGBA1C" in the last 72 hours. CBG: No results for input(s): "GLUCAP" in the last 168 hours. Lipid Profile: No results for input(s): "CHOL", "HDL", "LDLCALC", "TRIG", "CHOLHDL", "LDLDIRECT" in the last 72 hours. Thyroid Function Tests: No results for input(s): "TSH", "T4TOTAL", "FREET4", "T3FREE", "THYROIDAB" in the last 72 hours. Anemia Panel: Recent Labs    04/22/23 1154  VITAMINB12 385  FOLATE 10.3  FERRITIN 217  TIBC 148*  IRON 28*   Urine analysis:    Component Value Date/Time   COLORURINE YELLOW 03/22/2023 2153   APPEARANCEUR HAZY (A) 03/22/2023 2153   LABSPEC 1.013 03/22/2023 2153   PHURINE 7.0 03/22/2023 2153   GLUCOSEU NEGATIVE 03/22/2023 2153   HGBUR NEGATIVE 03/22/2023 2153   BILIRUBINUR NEGATIVE 03/22/2023 2153   KETONESUR 20 (A) 03/22/2023 2153  PROTEINUR NEGATIVE 03/22/2023 2153   UROBILINOGEN 1.0 01/06/2012 1132   NITRITE NEGATIVE 03/22/2023 2153   LEUKOCYTESUR NEGATIVE 03/22/2023 2153   Sepsis Labs: @LABRCNTIP (procalcitonin:4,lacticidven:4)  ) Recent Results (from the past 240 hour(s))  Surgical pcr screen     Status: None   Collection Time: 04/18/23  9:46 PM   Specimen: Nasal Mucosa; Nasal Swab  Result Value Ref Range Status   MRSA, PCR NEGATIVE NEGATIVE Final   Staphylococcus aureus NEGATIVE NEGATIVE Final    Comment: (NOTE) The Xpert SA Assay (FDA approved for NASAL specimens in patients 35 years of age and older), is one component of a comprehensive surveillance program. It is not intended to diagnose infection nor to guide or monitor treatment. Performed at [facility], [address], [place], [place] [address]       Studies: No  results found.  Scheduled Meds:  allopurinol  100 mg Oral Daily   aspirin  81 mg Oral BID   atorvastatin  40 mg Oral QPM   Chlorhexidine Gluconate Cloth  6 each Topical Daily   vitamin B-12  1,000 mcg Oral Daily   docusate sodium  100 mg Oral BID   feeding supplement  237 mL Oral TID BM   feeding supplement  296 mL Oral 120 min pre-op   ferrous sulfate  325 mg Oral Q breakfast   linezolid  600 mg Oral Q12H   magnesium citrate  1 Bottle Oral Once   omega-3 acid ethyl esters  1 g Oral Daily   pantoprazole  40 mg Oral Daily   senna  1 tablet Oral BID   tamsulosin  0.4 mg Oral Daily    Continuous Infusions:     LOS: 6 days     Briant Cedar, MD [facility]  If 7PM-7AM, please contact night-coverage [email or web address] 04/23/2023, 3:25 PM

## 2023-04-24 ENCOUNTER — Other Ambulatory Visit (HOSPITAL_COMMUNITY): Payer: Self-pay

## 2023-04-24 DIAGNOSIS — E039 Hypothyroidism, unspecified: Secondary | ICD-10-CM

## 2023-04-24 DIAGNOSIS — I10 Essential (primary) hypertension: Secondary | ICD-10-CM

## 2023-04-24 DIAGNOSIS — S72002A Fracture of unspecified part of neck of left femur, initial encounter for closed fracture: Secondary | ICD-10-CM | POA: Diagnosis not present

## 2023-04-24 DIAGNOSIS — N401 Enlarged prostate with lower urinary tract symptoms: Secondary | ICD-10-CM | POA: Diagnosis not present

## 2023-04-24 DIAGNOSIS — N138 Other obstructive and reflux uropathy: Secondary | ICD-10-CM

## 2023-04-24 MED ORDER — METHOCARBAMOL 500 MG PO TABS
500.0000 mg | ORAL_TABLET | Freq: Three times a day (TID) | ORAL | 0 refills | Status: AC | PRN
Start: 2023-04-24 — End: ?
  Filled 2023-04-24: qty 20, 7d supply, fill #0

## 2023-04-24 MED ORDER — CYANOCOBALAMIN 1000 MCG PO TABS: 1000.0000 ug | ORAL_TABLET | Freq: Every day | ORAL | Status: AC

## 2023-04-24 NOTE — Plan of Care (Signed)
  Problem: Education: Goal: Knowledge of General Education information will improve Description: Including pain rating scale, medication(s)/side effects and non-pharmacologic comfort measures Outcome: Progressing   Problem: Health Behavior/Discharge Planning: Goal: Ability to manage health-related needs will improve Outcome: Progressing   Problem: Clinical Measurements: Goal: Ability to maintain clinical measurements within normal limits will improve Outcome: Progressing Goal: Will remain free from infection Outcome: Progressing Goal: Diagnostic test results will improve Outcome: Progressing Goal: Respiratory complications will improve Outcome: Progressing Goal: Cardiovascular complication will be avoided Outcome: Progressing   Problem: Coping: Goal: Level of anxiety will decrease Outcome: Progressing   Problem: Education: Goal: Knowledge of the prescribed therapeutic regimen will improve Outcome: Progressing Goal: Understanding of discharge needs will improve Outcome: Progressing Goal: Individualized Educational Video(s) Outcome: Progressing

## 2023-04-24 NOTE — Progress Notes (Signed)
Physical Therapy Treatment Patient Details Name: Dean Mayer MRN: [id number] DOB: 12/06/1940 Today's Date: 04/24/2023   History of Present Illness Pt is an 82 y.o. male who presented 04/17/23 s/p fall at [facility] Imaging revealed L hip fx. S/p L THA 9/13. Of note, pt recently admitted 8/14 -8/21 with left knee meniscal tear-managed without surgery and previously hospitalized 8/8 -8/14 for MSSA left knee septic arthritis. PMH includes HTN, throat and prostate CA.    PT Comments  This PT stopped by nurse to check orthostatics on pt. Pt with similar presentation to previous sessions for mobility. BP: 123/54 supine, BP: 105/56 seated, BP: 107/59 standing, BP: 112/60 seated. Pt reported dizziness upon sitting down. No change in DC/DME recs at this time. PT will continue to follow.    If plan is discharge home, recommend the following: A lot of help with walking and/or transfers;A lot of help with bathing/dressing/bathroom;Assistance with cooking/housework;Assist for transportation;Help with stairs or ramp for entrance   Can travel by private vehicle     No  Equipment Recommendations  Other (comment) (Per accepting facility)    Recommendations for Other Services OT consult     Precautions / Restrictions Precautions Precautions: Fall Restrictions Weight Bearing Restrictions: Yes LLE Weight Bearing: Weight bearing as tolerated     Mobility  Bed Mobility Overal bed mobility: Needs Assistance Bed Mobility: Supine to Sit, Sit to Supine     Supine to sit: Mod assist, HOB elevated, Used rails Sit to supine: Max assist, HOB elevated, Used rails   General bed mobility comments: Increased time with incremental assistance in managing L leg towards and off R EOB, using bed pad to pivot hips    Transfers Overall transfer level: Needs assistance Equipment used: Rolling walker (2 wheels) Transfers: Sit to/from Stand Sit to Stand: From elevated surface, +2 physical assistance, Min assist            General transfer comment: Pt able to complete sit to stand today for orthostatics. Pt reports dizziness upon sitting down.    Ambulation/Gait               General Gait Details: Deferred for safety.   Stairs             Wheelchair Mobility     Tilt Bed    Modified Rankin (Stroke Patients Only)       Balance Overall balance assessment: Needs assistance Sitting-balance support: No upper extremity supported, Feet supported Sitting balance-Leahy Scale: Fair     Standing balance support: Bilateral upper extremity supported, During functional activity, Reliant on assistive device for balance Standing balance-Leahy Scale: Poor Standing balance comment: Reliant on RW                            Cognition Arousal: Alert Behavior During Therapy: WFL for tasks assessed/performed Overall Cognitive Status: Within Functional Limits for tasks assessed                                 General Comments: very pleasant and conversational        Exercises      General Comments General comments (skin integrity, edema, etc.): BP: 123/54 supine, BP: 105/56 seated, BP: 107/59 standing, BP: 112/60 seated. Pt reported dizziness upon sitting down.      Pertinent Vitals/Pain Pain Assessment Pain Assessment: Faces Faces Pain Scale: Hurts little more Pain Location: L  hip and knee Pain Descriptors / Indicators: Discomfort, Guarding, Grimacing, Operative site guarding Pain Intervention(s): Monitored during session    Home Living                          Prior Function            PT Goals (current goals can now be found in the care plan section) Progress towards PT goals: Progressing toward goals    Frequency    Min 1X/week      PT Plan      Co-evaluation              AM-PAC PT "6 Clicks" Mobility   Outcome Measure  Help needed turning from your back to your side while in a flat bed without using bedrails?:  A Little Help needed moving from lying on your back to sitting on the side of a flat bed without using bedrails?: A Lot Help needed moving to and from a bed to a chair (including a wheelchair)?: A Lot Help needed standing up from a chair using your arms (e.g., wheelchair or bedside chair)?: A Lot Help needed to walk in hospital room?: A Lot Help needed climbing 3-5 steps with a railing? : Total 6 Click Score: 12    End of Session Equipment Utilized During Treatment: Gait belt Activity Tolerance: Patient tolerated treatment well Patient left: in bed;with call bell/phone within reach Nurse Communication: Mobility status;Need for lift equipment PT Visit Diagnosis: Other abnormalities of gait and mobility (R26.89);Muscle weakness (generalized) (M62.81);Pain;Unsteadiness on feet (R26.81);Difficulty in walking, not elsewhere classified (R26.2);Dizziness and giddiness (R42) Pain - Right/Left: Left Pain - part of body: Hip     Time: 1610-9604 PT Time Calculation (min) (ACUTE ONLY): 18 min  Charges:    $Therapeutic Activity: 8-22 mins PT General Charges $$ ACUTE PT VISIT: 1 Visit                     Dean Mayer, PT, DPT Acute Rehab Services [phone]    Dean Mayer 04/24/2023, 3:47 PM

## 2023-04-24 NOTE — TOC Progression Note (Addendum)
Transition of Care [facility]) - Progression Note    Patient Details  Name: Dean Mayer MRN: [id number] Date of Birth: [birth year]  [facility]) CM/SW Contact  Lorri Frederick, LCSW Phone Number: 04/24/2023, 8:45 AM  Clinical Narrative:   SNF auth approved: 4/01 - 9/1 [id number].    5956: the above Berkley Harvey is from prior dates at [facility].  Current auth request remains pending.      Expected Discharge Plan: Skilled Nursing Facility Barriers to Discharge: Continued Medical Work up  Expected Discharge Plan and Services In-house Referral: Clinical Social Work   Post Acute Care Choice: Skilled Nursing Facility Living arrangements for the past 2 months: Single Family Home                 DME Arranged: N/A DME Agency: NA                   Social Determinants of Health (SDOH) Interventions SDOH Screenings   Food Insecurity: No Food Insecurity (04/17/2023)  Housing: Low Risk  (04/17/2023)  Transportation Needs: No Transportation Needs (04/17/2023)  Utilities: Not At Risk (04/17/2023)  Financial Resource Strain: Low Risk  (11/19/2021)   Received from [facility], [facility]  Physical Activity: Inactive (11/19/2021)   Received from [facility], [facility]  Social Connections: Unknown (11/26/2022)   Received from [facility], [facility]  Stress: No Stress Concern Present (03/16/2022)   [facility], [facility]  Tobacco Use: Medium Risk (04/19/2023)    Readmission Risk Interventions    04/18/2023    9:55 AM  Readmission Risk Prevention Plan  Transportation Screening Complete  Medication Review (RN Care Manager) Complete  HRI or Home Care Consult Complete  SW Recovery Care/Counseling Consult Complete  Palliative Care Screening Not Applicable  Skilled Nursing Facility Not Complete  SNF Comments Awaiting PT evaluation following surgery.

## 2023-04-24 NOTE — Discharge Summary (Signed)
04/19/2023 14:55   DG C-Arm 1-60 Min-No Report  Result Date: 04/19/2023 Fluoroscopy was utilized by the requesting physician.  No radiographic interpretation.   DG C-Arm 1-60 Min-No Report  Result Date: 04/19/2023 Fluoroscopy was utilized by the requesting physician.  No radiographic interpretation.   VAS US CAROTID  Result Date: 04/18/2023 Carotid Arterial Duplex Study Patient Name:  Dean Mayer  Date of Exam:   04/18/2023 Medical Rec #: [id number]          Accession #:    [id number] Date of Birth: 1941/05/16          Patient Gender: M Patient Age:   82 years Exam Location:  [facility] Procedure:      VAS US CAROTID Referring Phys: [facility] --------------------------------------------------------------------------------  Indications:       Syncope. Right ICA/carotid bulb stent placed at outside                    facility 08-28-2021. 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 Risk Factors:      Hypertension, hyperlipidemia, past history of smoking, prior                    CVA. Comparison Study:  03-29-2022 Prior carotid duplex at outside facility with no                    evidence of restenosis of the right ICA/bulb. 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 Performing Technologist: Jean Rosenthal RDMS, RVT  Examination Guidelines: A complete evaluation includes B-mode imaging, spectral Doppler, color Doppler, and power Doppler as needed of all accessible portions of each vessel. Bilateral testing is considered an integral part of a complete  examination. Limited examinations for reoccurring indications may be performed as noted.  Right Carotid Findings: +----------+--------+--------+--------+------------------+------------------+           PSV cm/sEDV cm/sStenosisPlaque DescriptionComments           +----------+--------+--------+--------+------------------+------------------+ CCA Prox  206     26                                                   +----------+--------+--------+--------+------------------+------------------+ CCA Distal52      9                                 intimal thickening +----------+--------+--------+--------+------------------+------------------+ ICA Prox                                            Stent              +----------+--------+--------+--------+------------------+------------------+ ICA Distal86      15                                                   +----------+--------+--------+--------+------------------+------------------+  and comfortable  Respiratory system: Clear to auscultation. Respiratory effort normal. Cardiovascular system: S1 & S2 heard, RRR.  Gastrointestinal system: Abdomen is nondistended, soft and nontender.  Central nervous system: Alert and oriented. No focal neurological deficits. Extremities: Symmetric 5 x 5 power. Skin: No rashes, Psychiatry:Mood & affect appropriate.     Condition at discharge: fair  The results of significant diagnostics from this hospitalization (including imaging, microbiology, ancillary and laboratory) are listed below for reference.   Imaging Studies: ECHOCARDIOGRAM LIMITED  Result Date: 04/20/2023    ECHOCARDIOGRAM LIMITED REPORT   Patient Name:   Dean Mayer Date of Exam: 04/20/2023 Medical Rec #:  [id number]         Height:       76.0 in Accession #:    [id number]        Weight:       200.0 lb Date of Birth:  25-Apr-1941         BSA:          2.215 m Patient Age:    82 years          BP:           134/51 mmHg Patient Gender: M                 HR:           80  bpm. Exam Location:  Inpatient Procedure: Limited Echo and Color Doppler Indications:    Bacteremia  History:        Patient has prior history of Echocardiogram examinations, most                 recent 03/17/2023. Stroke, Signs/Symptoms:Bacteremia; Risk                 Factors:Hypertension.  Sonographer:    Milbert Coulter Referring Phys: [id number] DUKGU SWINTECK  Sonographer Comments: Technically challenging study due to limited acoustic windows, suboptimal parasternal window, suboptimal apical window and suboptimal subcostal window. Image acquisition challenging due to respiratory motion and Image acquisition challenging due to patient body habitus. IMPRESSIONS  1. Very limited study. Grossly normal left and right ventricular function. There are mild degenerative changes and calcification of the aortic leaflets and mitral annulus. No major valvular abnormalities are seen. Conclusion(s)/Recommendation(s): No evidence of valvular vegetations on this transthoracic echocardiogram. Consider a transesophageal echocardiogram to exclude infective endocarditis if clinically indicated. FINDINGS  Additional Comments: Color Doppler performed.  Thurmon Fair MD Electronically signed by Thurmon Fair MD Signature Date/Time: 04/20/2023/12:14:48 PM    Final    DG HIP UNILAT WITH PELVIS 1V LEFT  Result Date: 04/19/2023 CLINICAL DATA:  Left hip fracture. EXAM: DG HIP (WITH OR WITHOUT PELVIS) 1V*L*; DG C-ARM 1-60 MIN-NO REPORT Radiation exposure index: 2.65 mGy. COMPARISON:  April 17, 2023. FINDINGS: Four intraoperative fluoroscopic images were obtained of the left hip. The femoral and acetabular components are well situated. IMPRESSION: Fluoroscopic guidance provided during left total hip arthroplasty. Electronically Signed   By: Lupita Raider M.D.   On: 04/19/2023 14:58   DG Pelvis Portable  Result Date: 04/19/2023 CLINICAL DATA:  Left femur fracture. EXAM: PORTABLE PELVIS 1-2 VIEWS COMPARISON:  April 17, 2023.  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 Acetabular and femoral components are well situated. Expected postoperative changes are noted in the surrounding soft tissues. IMPRESSION: Status post left total hip arthroplasty. Electronically Signed   By: Lupita Raider M.D.   On:  04/19/2023 14:55   DG C-Arm 1-60 Min-No Report  Result Date: 04/19/2023 Fluoroscopy was utilized by the requesting physician.  No radiographic interpretation.   DG C-Arm 1-60 Min-No Report  Result Date: 04/19/2023 Fluoroscopy was utilized by the requesting physician.  No radiographic interpretation.   VAS US CAROTID  Result Date: 04/18/2023 Carotid Arterial Duplex Study Patient Name:  Dean Mayer  Date of Exam:   04/18/2023 Medical Rec #: [id number]          Accession #:    [id number] Date of Birth: 1941/05/16          Patient Gender: M Patient Age:   82 years Exam Location:  [facility] Procedure:      VAS US CAROTID Referring Phys: [facility] --------------------------------------------------------------------------------  Indications:       Syncope. Right ICA/carotid bulb stent placed at outside                    facility 08-28-2021. 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 Risk Factors:      Hypertension, hyperlipidemia, past history of smoking, prior                    CVA. Comparison Study:  03-29-2022 Prior carotid duplex at outside facility with no                    evidence of restenosis of the right ICA/bulb. 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 Performing Technologist: Jean Rosenthal RDMS, RVT  Examination Guidelines: A complete evaluation includes B-mode imaging, spectral Doppler, color Doppler, and power Doppler as needed of all accessible portions of each vessel. Bilateral testing is considered an integral part of a complete  examination. Limited examinations for reoccurring indications may be performed as noted.  Right Carotid Findings: +----------+--------+--------+--------+------------------+------------------+           PSV cm/sEDV cm/sStenosisPlaque DescriptionComments           +----------+--------+--------+--------+------------------+------------------+ CCA Prox  206     26                                                   +----------+--------+--------+--------+------------------+------------------+ CCA Distal52      9                                 intimal thickening +----------+--------+--------+--------+------------------+------------------+ ICA Prox                                            Stent              +----------+--------+--------+--------+------------------+------------------+ ICA Distal86      15                                                   +----------+--------+--------+--------+------------------+------------------+  and comfortable  Respiratory system: Clear to auscultation. Respiratory effort normal. Cardiovascular system: S1 & S2 heard, RRR.  Gastrointestinal system: Abdomen is nondistended, soft and nontender.  Central nervous system: Alert and oriented. No focal neurological deficits. Extremities: Symmetric 5 x 5 power. Skin: No rashes, Psychiatry:Mood & affect appropriate.     Condition at discharge: fair  The results of significant diagnostics from this hospitalization (including imaging, microbiology, ancillary and laboratory) are listed below for reference.   Imaging Studies: ECHOCARDIOGRAM LIMITED  Result Date: 04/20/2023    ECHOCARDIOGRAM LIMITED REPORT   Patient Name:   Dean Mayer Date of Exam: 04/20/2023 Medical Rec #:  [id number]         Height:       76.0 in Accession #:    [id number]        Weight:       200.0 lb Date of Birth:  25-Apr-1941         BSA:          2.215 m Patient Age:    82 years          BP:           134/51 mmHg Patient Gender: M                 HR:           80  bpm. Exam Location:  Inpatient Procedure: Limited Echo and Color Doppler Indications:    Bacteremia  History:        Patient has prior history of Echocardiogram examinations, most                 recent 03/17/2023. Stroke, Signs/Symptoms:Bacteremia; Risk                 Factors:Hypertension.  Sonographer:    Milbert Coulter Referring Phys: [id number] DUKGU SWINTECK  Sonographer Comments: Technically challenging study due to limited acoustic windows, suboptimal parasternal window, suboptimal apical window and suboptimal subcostal window. Image acquisition challenging due to respiratory motion and Image acquisition challenging due to patient body habitus. IMPRESSIONS  1. Very limited study. Grossly normal left and right ventricular function. There are mild degenerative changes and calcification of the aortic leaflets and mitral annulus. No major valvular abnormalities are seen. Conclusion(s)/Recommendation(s): No evidence of valvular vegetations on this transthoracic echocardiogram. Consider a transesophageal echocardiogram to exclude infective endocarditis if clinically indicated. FINDINGS  Additional Comments: Color Doppler performed.  Thurmon Fair MD Electronically signed by Thurmon Fair MD Signature Date/Time: 04/20/2023/12:14:48 PM    Final    DG HIP UNILAT WITH PELVIS 1V LEFT  Result Date: 04/19/2023 CLINICAL DATA:  Left hip fracture. EXAM: DG HIP (WITH OR WITHOUT PELVIS) 1V*L*; DG C-ARM 1-60 MIN-NO REPORT Radiation exposure index: 2.65 mGy. COMPARISON:  April 17, 2023. FINDINGS: Four intraoperative fluoroscopic images were obtained of the left hip. The femoral and acetabular components are well situated. IMPRESSION: Fluoroscopic guidance provided during left total hip arthroplasty. Electronically Signed   By: Lupita Raider M.D.   On: 04/19/2023 14:58   DG Pelvis Portable  Result Date: 04/19/2023 CLINICAL DATA:  Left femur fracture. EXAM: PORTABLE PELVIS 1-2 VIEWS COMPARISON:  April 17, 2023.  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 Acetabular and femoral components are well situated. Expected postoperative changes are noted in the surrounding soft tissues. IMPRESSION: Status post left total hip arthroplasty. Electronically Signed   By: Lupita Raider M.D.   On:  and comfortable  Respiratory system: Clear to auscultation. Respiratory effort normal. Cardiovascular system: S1 & S2 heard, RRR.  Gastrointestinal system: Abdomen is nondistended, soft and nontender.  Central nervous system: Alert and oriented. No focal neurological deficits. Extremities: Symmetric 5 x 5 power. Skin: No rashes, Psychiatry:Mood & affect appropriate.     Condition at discharge: fair  The results of significant diagnostics from this hospitalization (including imaging, microbiology, ancillary and laboratory) are listed below for reference.   Imaging Studies: ECHOCARDIOGRAM LIMITED  Result Date: 04/20/2023    ECHOCARDIOGRAM LIMITED REPORT   Patient Name:   Dean Mayer Date of Exam: 04/20/2023 Medical Rec #:  [id number]         Height:       76.0 in Accession #:    [id number]        Weight:       200.0 lb Date of Birth:  25-Apr-1941         BSA:          2.215 m Patient Age:    82 years          BP:           134/51 mmHg Patient Gender: M                 HR:           80  bpm. Exam Location:  Inpatient Procedure: Limited Echo and Color Doppler Indications:    Bacteremia  History:        Patient has prior history of Echocardiogram examinations, most                 recent 03/17/2023. Stroke, Signs/Symptoms:Bacteremia; Risk                 Factors:Hypertension.  Sonographer:    Milbert Coulter Referring Phys: [id number] DUKGU SWINTECK  Sonographer Comments: Technically challenging study due to limited acoustic windows, suboptimal parasternal window, suboptimal apical window and suboptimal subcostal window. Image acquisition challenging due to respiratory motion and Image acquisition challenging due to patient body habitus. IMPRESSIONS  1. Very limited study. Grossly normal left and right ventricular function. There are mild degenerative changes and calcification of the aortic leaflets and mitral annulus. No major valvular abnormalities are seen. Conclusion(s)/Recommendation(s): No evidence of valvular vegetations on this transthoracic echocardiogram. Consider a transesophageal echocardiogram to exclude infective endocarditis if clinically indicated. FINDINGS  Additional Comments: Color Doppler performed.  Thurmon Fair MD Electronically signed by Thurmon Fair MD Signature Date/Time: 04/20/2023/12:14:48 PM    Final    DG HIP UNILAT WITH PELVIS 1V LEFT  Result Date: 04/19/2023 CLINICAL DATA:  Left hip fracture. EXAM: DG HIP (WITH OR WITHOUT PELVIS) 1V*L*; DG C-ARM 1-60 MIN-NO REPORT Radiation exposure index: 2.65 mGy. COMPARISON:  April 17, 2023. FINDINGS: Four intraoperative fluoroscopic images were obtained of the left hip. The femoral and acetabular components are well situated. IMPRESSION: Fluoroscopic guidance provided during left total hip arthroplasty. Electronically Signed   By: Lupita Raider M.D.   On: 04/19/2023 14:58   DG Pelvis Portable  Result Date: 04/19/2023 CLINICAL DATA:  Left femur fracture. EXAM: PORTABLE PELVIS 1-2 VIEWS COMPARISON:  April 17, 2023.  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 Acetabular and femoral components are well situated. Expected postoperative changes are noted in the surrounding soft tissues. IMPRESSION: Status post left total hip arthroplasty. Electronically Signed   By: Lupita Raider M.D.   On:  04/19/2023 14:55   DG C-Arm 1-60 Min-No Report  Result Date: 04/19/2023 Fluoroscopy was utilized by the requesting physician.  No radiographic interpretation.   DG C-Arm 1-60 Min-No Report  Result Date: 04/19/2023 Fluoroscopy was utilized by the requesting physician.  No radiographic interpretation.   VAS US CAROTID  Result Date: 04/18/2023 Carotid Arterial Duplex Study Patient Name:  Dean Mayer  Date of Exam:   04/18/2023 Medical Rec #: [id number]          Accession #:    [id number] Date of Birth: 1941/05/16          Patient Gender: M Patient Age:   82 years Exam Location:  [facility] Procedure:      VAS US CAROTID Referring Phys: [facility] --------------------------------------------------------------------------------  Indications:       Syncope. Right ICA/carotid bulb stent placed at outside                    facility 08-28-2021. 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 Risk Factors:      Hypertension, hyperlipidemia, past history of smoking, prior                    CVA. Comparison Study:  03-29-2022 Prior carotid duplex at outside facility with no                    evidence of restenosis of the right ICA/bulb. 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 Performing Technologist: Jean Rosenthal RDMS, RVT  Examination Guidelines: A complete evaluation includes B-mode imaging, spectral Doppler, color Doppler, and power Doppler as needed of all accessible portions of each vessel. Bilateral testing is considered an integral part of a complete  examination. Limited examinations for reoccurring indications may be performed as noted.  Right Carotid Findings: +----------+--------+--------+--------+------------------+------------------+           PSV cm/sEDV cm/sStenosisPlaque DescriptionComments           +----------+--------+--------+--------+------------------+------------------+ CCA Prox  206     26                                                   +----------+--------+--------+--------+------------------+------------------+ CCA Distal52      9                                 intimal thickening +----------+--------+--------+--------+------------------+------------------+ ICA Prox                                            Stent              +----------+--------+--------+--------+------------------+------------------+ ICA Distal86      15                                                   +----------+--------+--------+--------+------------------+------------------+  and comfortable  Respiratory system: Clear to auscultation. Respiratory effort normal. Cardiovascular system: S1 & S2 heard, RRR.  Gastrointestinal system: Abdomen is nondistended, soft and nontender.  Central nervous system: Alert and oriented. No focal neurological deficits. Extremities: Symmetric 5 x 5 power. Skin: No rashes, Psychiatry:Mood & affect appropriate.     Condition at discharge: fair  The results of significant diagnostics from this hospitalization (including imaging, microbiology, ancillary and laboratory) are listed below for reference.   Imaging Studies: ECHOCARDIOGRAM LIMITED  Result Date: 04/20/2023    ECHOCARDIOGRAM LIMITED REPORT   Patient Name:   Dean Mayer Date of Exam: 04/20/2023 Medical Rec #:  [id number]         Height:       76.0 in Accession #:    [id number]        Weight:       200.0 lb Date of Birth:  25-Apr-1941         BSA:          2.215 m Patient Age:    82 years          BP:           134/51 mmHg Patient Gender: M                 HR:           80  bpm. Exam Location:  Inpatient Procedure: Limited Echo and Color Doppler Indications:    Bacteremia  History:        Patient has prior history of Echocardiogram examinations, most                 recent 03/17/2023. Stroke, Signs/Symptoms:Bacteremia; Risk                 Factors:Hypertension.  Sonographer:    Milbert Coulter Referring Phys: [id number] DUKGU SWINTECK  Sonographer Comments: Technically challenging study due to limited acoustic windows, suboptimal parasternal window, suboptimal apical window and suboptimal subcostal window. Image acquisition challenging due to respiratory motion and Image acquisition challenging due to patient body habitus. IMPRESSIONS  1. Very limited study. Grossly normal left and right ventricular function. There are mild degenerative changes and calcification of the aortic leaflets and mitral annulus. No major valvular abnormalities are seen. Conclusion(s)/Recommendation(s): No evidence of valvular vegetations on this transthoracic echocardiogram. Consider a transesophageal echocardiogram to exclude infective endocarditis if clinically indicated. FINDINGS  Additional Comments: Color Doppler performed.  Thurmon Fair MD Electronically signed by Thurmon Fair MD Signature Date/Time: 04/20/2023/12:14:48 PM    Final    DG HIP UNILAT WITH PELVIS 1V LEFT  Result Date: 04/19/2023 CLINICAL DATA:  Left hip fracture. EXAM: DG HIP (WITH OR WITHOUT PELVIS) 1V*L*; DG C-ARM 1-60 MIN-NO REPORT Radiation exposure index: 2.65 mGy. COMPARISON:  April 17, 2023. FINDINGS: Four intraoperative fluoroscopic images were obtained of the left hip. The femoral and acetabular components are well situated. IMPRESSION: Fluoroscopic guidance provided during left total hip arthroplasty. Electronically Signed   By: Lupita Raider M.D.   On: 04/19/2023 14:58   DG Pelvis Portable  Result Date: 04/19/2023 CLINICAL DATA:  Left femur fracture. EXAM: PORTABLE PELVIS 1-2 VIEWS COMPARISON:  April 17, 2023.  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 Acetabular and femoral components are well situated. Expected postoperative changes are noted in the surrounding soft tissues. IMPRESSION: Status post left total hip arthroplasty. Electronically Signed   By: Lupita Raider M.D.   On:

## 2023-04-24 NOTE — Progress Notes (Signed)
Mobility Specialist: Progress Note   04/24/23 1609  Therapy Vitals  Patient Position (if appropriate) Orthostatic Vitals  Orthostatic Lying   BP- Lying 123/54  Pulse- Lying 75  Orthostatic Sitting  BP- Sitting 105/56  Pulse- Sitting 87  Orthostatic Standing at 0 minutes  BP- Standing at 0 minutes 107/59  Pulse- Standing at 0 minutes 101  Mobility  Activity Stood at bedside  Level of Assistance Minimal assist, patient does 75% or more (+2)  Assistive Device Front wheel walker  LLE Weight Bearing WBAT  Activity Response Tolerated well  Mobility Referral Yes  $Mobility charge 1 Mobility  Mobility Specialist Start Time (ACUTE ONLY) 1256  Mobility Specialist Stop Time (ACUTE ONLY) 1310  Mobility Specialist Time Calculation (min) (ACUTE ONLY) 14 min    RN requested MS take orthostatics - pt agreeable to session and received in bed. MinA for bed mobility, minA +2 for STS. Had c/o dizziness when sitting at EOS - BP 112/60 (74). Left in bed with all needs met, call bell in reach.   Maurene Capes Mobility Specialist Please contact via SecureChat or Rehab office at [phone]

## 2023-04-24 NOTE — Progress Notes (Signed)
Bladder scan done with 633 ml result but pt voided in urinal with 350 urine output.

## 2023-04-25 DIAGNOSIS — S72002A Fracture of unspecified part of neck of left femur, initial encounter for closed fracture: Secondary | ICD-10-CM | POA: Diagnosis not present

## 2023-04-25 NOTE — Discharge Summary (Signed)
1-60 Min-No Report  Result Date: 04/19/2023 Fluoroscopy was utilized by the requesting physician.  No radiographic interpretation.   DG C-Arm 1-60 Min-No Report  Result Date: 04/19/2023 Fluoroscopy was utilized by the requesting physician.  No radiographic interpretation.   VAS US CAROTID  Result Date: 04/18/2023 Carotid Arterial Duplex Study Patient Name:  Dean Mayer  Date of Exam:   04/18/2023 Medical Rec #: [id number]          Accession #:    [id number] Date of Birth: 1941/04/24          Patient Gender: M Patient Age:   82 years Exam Location:  [facility] Procedure:      VAS US CAROTID Referring Phys: [facility] [place] --------------------------------------------------------------------------------  Indications:       Syncope. Right ICA/carotid bulb stent placed at outside                    facility 08-28-2021. 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 Risk Factors:      Hypertension, hyperlipidemia, past history of smoking, prior                    CVA. Comparison Study:  03-29-2022 Prior carotid duplex at outside facility with no                    evidence of restenosis of the right ICA/bulb. 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 Performing Technologist: Jean Rosenthal RDMS, RVT  Examination Guidelines: A complete evaluation includes B-mode imaging, spectral Doppler, color Doppler, and power Doppler as needed of all accessible portions of each vessel. Bilateral testing is considered an integral part of a complete examination. Limited examinations for  reoccurring indications may be performed as noted.  Right Carotid Findings: +----------+--------+--------+--------+------------------+------------------+           PSV cm/sEDV cm/sStenosisPlaque DescriptionComments           +----------+--------+--------+--------+------------------+------------------+ CCA Prox  206     26                                                   +----------+--------+--------+--------+------------------+------------------+ CCA Distal52      9                                 intimal thickening +----------+--------+--------+--------+------------------+------------------+ ICA Prox                                            Stent              +----------+--------+--------+--------+------------------+------------------+ ICA Distal86      15                                                   +----------+--------+--------+--------+------------------+------------------+  to auscultation. Respiratory effort normal. Cardiovascular system: S1 & S2 heard, RRR.  Gastrointestinal system: Abdomen is nondistended, soft and nontender.  Central nervous system: Alert and oriented. No focal neurological deficits. Extremities: Symmetric 5 x 5 power. Skin: No rashes, Psychiatry:Mood & affect appropriate.     Condition at discharge: fair  The results of significant diagnostics from this hospitalization (including imaging, microbiology, ancillary and laboratory) are listed below for reference.   Imaging Studies: ECHOCARDIOGRAM LIMITED  Result Date: 04/20/2023    ECHOCARDIOGRAM LIMITED REPORT   Patient Name:   Dean Mayer Date of Exam: 04/20/2023 Medical Rec #:  [id number]         Height:       76.0 in Accession #:    [id number]        Weight:       200.0 lb Date of Birth:  05/23/1941         BSA:          2.215 m Patient Age:    82 years          BP:           134/51 mmHg Patient Gender: M                 HR:           80 bpm. Exam Location:  Inpatient  Procedure: Limited Echo and Color Doppler Indications:    Bacteremia  History:        Patient has prior history of Echocardiogram examinations, most                 recent 03/17/2023. Stroke, Signs/Symptoms:Bacteremia; Risk                 Factors:Hypertension.  Sonographer:    Milbert Coulter Referring Phys: [id number] HYQMV SWINTECK  Sonographer Comments: Technically challenging study due to limited acoustic windows, suboptimal parasternal window, suboptimal apical window and suboptimal subcostal window. Image acquisition challenging due to respiratory motion and Image acquisition challenging due to patient body habitus. IMPRESSIONS  1. Very limited study. Grossly normal left and right ventricular function. There are mild degenerative changes and calcification of the aortic leaflets and mitral annulus. No major valvular abnormalities are seen. Conclusion(s)/Recommendation(s): No evidence of valvular vegetations on this transthoracic echocardiogram. Consider a transesophageal echocardiogram to exclude infective endocarditis if clinically indicated. FINDINGS  Additional Comments: Color Doppler performed.  Thurmon Fair MD Electronically signed by Thurmon Fair MD Signature Date/Time: 04/20/2023/12:14:48 PM    Final    DG HIP UNILAT WITH PELVIS 1V LEFT  Result Date: 04/19/2023 CLINICAL DATA:  Left hip fracture. EXAM: DG HIP (WITH OR WITHOUT PELVIS) 1V*L*; DG C-ARM 1-60 MIN-NO REPORT Radiation exposure index: 2.65 mGy. COMPARISON:  April 17, 2023. FINDINGS: Four intraoperative fluoroscopic images were obtained of the left hip. The femoral and acetabular components are well situated. IMPRESSION: Fluoroscopic guidance provided during left total hip arthroplasty. Electronically Signed   By: Lupita Raider M.D.   On: 04/19/2023 14:58   DG Pelvis Portable  Result Date: 04/19/2023 CLINICAL DATA:  Left femur fracture. EXAM: PORTABLE PELVIS 1-2 VIEWS COMPARISON:  April 17, 2023. FINDINGS: Status post left total hip  arthroplasty for treatment of proximal left femoral fracture. Acetabular and femoral components are well situated. Expected postoperative changes are noted in the surrounding soft tissues. IMPRESSION: Status post left total hip arthroplasty. Electronically Signed   By: Lupita Raider M.D.   On: 04/19/2023 14:55   DG C-Arm  to auscultation. Respiratory effort normal. Cardiovascular system: S1 & S2 heard, RRR.  Gastrointestinal system: Abdomen is nondistended, soft and nontender.  Central nervous system: Alert and oriented. No focal neurological deficits. Extremities: Symmetric 5 x 5 power. Skin: No rashes, Psychiatry:Mood & affect appropriate.     Condition at discharge: fair  The results of significant diagnostics from this hospitalization (including imaging, microbiology, ancillary and laboratory) are listed below for reference.   Imaging Studies: ECHOCARDIOGRAM LIMITED  Result Date: 04/20/2023    ECHOCARDIOGRAM LIMITED REPORT   Patient Name:   Dean Mayer Date of Exam: 04/20/2023 Medical Rec #:  [id number]         Height:       76.0 in Accession #:    [id number]        Weight:       200.0 lb Date of Birth:  05/23/1941         BSA:          2.215 m Patient Age:    82 years          BP:           134/51 mmHg Patient Gender: M                 HR:           80 bpm. Exam Location:  Inpatient  Procedure: Limited Echo and Color Doppler Indications:    Bacteremia  History:        Patient has prior history of Echocardiogram examinations, most                 recent 03/17/2023. Stroke, Signs/Symptoms:Bacteremia; Risk                 Factors:Hypertension.  Sonographer:    Milbert Coulter Referring Phys: [id number] HYQMV SWINTECK  Sonographer Comments: Technically challenging study due to limited acoustic windows, suboptimal parasternal window, suboptimal apical window and suboptimal subcostal window. Image acquisition challenging due to respiratory motion and Image acquisition challenging due to patient body habitus. IMPRESSIONS  1. Very limited study. Grossly normal left and right ventricular function. There are mild degenerative changes and calcification of the aortic leaflets and mitral annulus. No major valvular abnormalities are seen. Conclusion(s)/Recommendation(s): No evidence of valvular vegetations on this transthoracic echocardiogram. Consider a transesophageal echocardiogram to exclude infective endocarditis if clinically indicated. FINDINGS  Additional Comments: Color Doppler performed.  Thurmon Fair MD Electronically signed by Thurmon Fair MD Signature Date/Time: 04/20/2023/12:14:48 PM    Final    DG HIP UNILAT WITH PELVIS 1V LEFT  Result Date: 04/19/2023 CLINICAL DATA:  Left hip fracture. EXAM: DG HIP (WITH OR WITHOUT PELVIS) 1V*L*; DG C-ARM 1-60 MIN-NO REPORT Radiation exposure index: 2.65 mGy. COMPARISON:  April 17, 2023. FINDINGS: Four intraoperative fluoroscopic images were obtained of the left hip. The femoral and acetabular components are well situated. IMPRESSION: Fluoroscopic guidance provided during left total hip arthroplasty. Electronically Signed   By: Lupita Raider M.D.   On: 04/19/2023 14:58   DG Pelvis Portable  Result Date: 04/19/2023 CLINICAL DATA:  Left femur fracture. EXAM: PORTABLE PELVIS 1-2 VIEWS COMPARISON:  April 17, 2023. FINDINGS: Status post left total hip  arthroplasty for treatment of proximal left femoral fracture. Acetabular and femoral components are well situated. Expected postoperative changes are noted in the surrounding soft tissues. IMPRESSION: Status post left total hip arthroplasty. Electronically Signed   By: Lupita Raider M.D.   On: 04/19/2023 14:55   DG C-Arm  1-60 Min-No Report  Result Date: 04/19/2023 Fluoroscopy was utilized by the requesting physician.  No radiographic interpretation.   DG C-Arm 1-60 Min-No Report  Result Date: 04/19/2023 Fluoroscopy was utilized by the requesting physician.  No radiographic interpretation.   VAS US CAROTID  Result Date: 04/18/2023 Carotid Arterial Duplex Study Patient Name:  Dean Mayer  Date of Exam:   04/18/2023 Medical Rec #: [id number]          Accession #:    [id number] Date of Birth: 1941/04/24          Patient Gender: M Patient Age:   82 years Exam Location:  [facility] Procedure:      VAS US CAROTID Referring Phys: [facility] [place] --------------------------------------------------------------------------------  Indications:       Syncope. Right ICA/carotid bulb stent placed at outside                    facility 08-28-2021. 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 Risk Factors:      Hypertension, hyperlipidemia, past history of smoking, prior                    CVA. Comparison Study:  03-29-2022 Prior carotid duplex at outside facility with no                    evidence of restenosis of the right ICA/bulb. 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 Performing Technologist: Jean Rosenthal RDMS, RVT  Examination Guidelines: A complete evaluation includes B-mode imaging, spectral Doppler, color Doppler, and power Doppler as needed of all accessible portions of each vessel. Bilateral testing is considered an integral part of a complete examination. Limited examinations for  reoccurring indications may be performed as noted.  Right Carotid Findings: +----------+--------+--------+--------+------------------+------------------+           PSV cm/sEDV cm/sStenosisPlaque DescriptionComments           +----------+--------+--------+--------+------------------+------------------+ CCA Prox  206     26                                                   +----------+--------+--------+--------+------------------+------------------+ CCA Distal52      9                                 intimal thickening +----------+--------+--------+--------+------------------+------------------+ ICA Prox                                            Stent              +----------+--------+--------+--------+------------------+------------------+ ICA Distal86      15                                                   +----------+--------+--------+--------+------------------+------------------+  to auscultation. Respiratory effort normal. Cardiovascular system: S1 & S2 heard, RRR.  Gastrointestinal system: Abdomen is nondistended, soft and nontender.  Central nervous system: Alert and oriented. No focal neurological deficits. Extremities: Symmetric 5 x 5 power. Skin: No rashes, Psychiatry:Mood & affect appropriate.     Condition at discharge: fair  The results of significant diagnostics from this hospitalization (including imaging, microbiology, ancillary and laboratory) are listed below for reference.   Imaging Studies: ECHOCARDIOGRAM LIMITED  Result Date: 04/20/2023    ECHOCARDIOGRAM LIMITED REPORT   Patient Name:   Dean Mayer Date of Exam: 04/20/2023 Medical Rec #:  [id number]         Height:       76.0 in Accession #:    [id number]        Weight:       200.0 lb Date of Birth:  05/23/1941         BSA:          2.215 m Patient Age:    82 years          BP:           134/51 mmHg Patient Gender: M                 HR:           80 bpm. Exam Location:  Inpatient  Procedure: Limited Echo and Color Doppler Indications:    Bacteremia  History:        Patient has prior history of Echocardiogram examinations, most                 recent 03/17/2023. Stroke, Signs/Symptoms:Bacteremia; Risk                 Factors:Hypertension.  Sonographer:    Milbert Coulter Referring Phys: [id number] HYQMV SWINTECK  Sonographer Comments: Technically challenging study due to limited acoustic windows, suboptimal parasternal window, suboptimal apical window and suboptimal subcostal window. Image acquisition challenging due to respiratory motion and Image acquisition challenging due to patient body habitus. IMPRESSIONS  1. Very limited study. Grossly normal left and right ventricular function. There are mild degenerative changes and calcification of the aortic leaflets and mitral annulus. No major valvular abnormalities are seen. Conclusion(s)/Recommendation(s): No evidence of valvular vegetations on this transthoracic echocardiogram. Consider a transesophageal echocardiogram to exclude infective endocarditis if clinically indicated. FINDINGS  Additional Comments: Color Doppler performed.  Thurmon Fair MD Electronically signed by Thurmon Fair MD Signature Date/Time: 04/20/2023/12:14:48 PM    Final    DG HIP UNILAT WITH PELVIS 1V LEFT  Result Date: 04/19/2023 CLINICAL DATA:  Left hip fracture. EXAM: DG HIP (WITH OR WITHOUT PELVIS) 1V*L*; DG C-ARM 1-60 MIN-NO REPORT Radiation exposure index: 2.65 mGy. COMPARISON:  April 17, 2023. FINDINGS: Four intraoperative fluoroscopic images were obtained of the left hip. The femoral and acetabular components are well situated. IMPRESSION: Fluoroscopic guidance provided during left total hip arthroplasty. Electronically Signed   By: Lupita Raider M.D.   On: 04/19/2023 14:58   DG Pelvis Portable  Result Date: 04/19/2023 CLINICAL DATA:  Left femur fracture. EXAM: PORTABLE PELVIS 1-2 VIEWS COMPARISON:  April 17, 2023. FINDINGS: Status post left total hip  arthroplasty for treatment of proximal left femoral fracture. Acetabular and femoral components are well situated. Expected postoperative changes are noted in the surrounding soft tissues. IMPRESSION: Status post left total hip arthroplasty. Electronically Signed   By: Lupita Raider M.D.   On: 04/19/2023 14:55   DG C-Arm  to auscultation. Respiratory effort normal. Cardiovascular system: S1 & S2 heard, RRR.  Gastrointestinal system: Abdomen is nondistended, soft and nontender.  Central nervous system: Alert and oriented. No focal neurological deficits. Extremities: Symmetric 5 x 5 power. Skin: No rashes, Psychiatry:Mood & affect appropriate.     Condition at discharge: fair  The results of significant diagnostics from this hospitalization (including imaging, microbiology, ancillary and laboratory) are listed below for reference.   Imaging Studies: ECHOCARDIOGRAM LIMITED  Result Date: 04/20/2023    ECHOCARDIOGRAM LIMITED REPORT   Patient Name:   Dean Mayer Date of Exam: 04/20/2023 Medical Rec #:  [id number]         Height:       76.0 in Accession #:    [id number]        Weight:       200.0 lb Date of Birth:  05/23/1941         BSA:          2.215 m Patient Age:    82 years          BP:           134/51 mmHg Patient Gender: M                 HR:           80 bpm. Exam Location:  Inpatient  Procedure: Limited Echo and Color Doppler Indications:    Bacteremia  History:        Patient has prior history of Echocardiogram examinations, most                 recent 03/17/2023. Stroke, Signs/Symptoms:Bacteremia; Risk                 Factors:Hypertension.  Sonographer:    Milbert Coulter Referring Phys: [id number] HYQMV SWINTECK  Sonographer Comments: Technically challenging study due to limited acoustic windows, suboptimal parasternal window, suboptimal apical window and suboptimal subcostal window. Image acquisition challenging due to respiratory motion and Image acquisition challenging due to patient body habitus. IMPRESSIONS  1. Very limited study. Grossly normal left and right ventricular function. There are mild degenerative changes and calcification of the aortic leaflets and mitral annulus. No major valvular abnormalities are seen. Conclusion(s)/Recommendation(s): No evidence of valvular vegetations on this transthoracic echocardiogram. Consider a transesophageal echocardiogram to exclude infective endocarditis if clinically indicated. FINDINGS  Additional Comments: Color Doppler performed.  Thurmon Fair MD Electronically signed by Thurmon Fair MD Signature Date/Time: 04/20/2023/12:14:48 PM    Final    DG HIP UNILAT WITH PELVIS 1V LEFT  Result Date: 04/19/2023 CLINICAL DATA:  Left hip fracture. EXAM: DG HIP (WITH OR WITHOUT PELVIS) 1V*L*; DG C-ARM 1-60 MIN-NO REPORT Radiation exposure index: 2.65 mGy. COMPARISON:  April 17, 2023. FINDINGS: Four intraoperative fluoroscopic images were obtained of the left hip. The femoral and acetabular components are well situated. IMPRESSION: Fluoroscopic guidance provided during left total hip arthroplasty. Electronically Signed   By: Lupita Raider M.D.   On: 04/19/2023 14:58   DG Pelvis Portable  Result Date: 04/19/2023 CLINICAL DATA:  Left femur fracture. EXAM: PORTABLE PELVIS 1-2 VIEWS COMPARISON:  April 17, 2023. FINDINGS: Status post left total hip  arthroplasty for treatment of proximal left femoral fracture. Acetabular and femoral components are well situated. Expected postoperative changes are noted in the surrounding soft tissues. IMPRESSION: Status post left total hip arthroplasty. Electronically Signed   By: Lupita Raider M.D.   On: 04/19/2023 14:55   DG C-Arm

## 2023-04-25 NOTE — Plan of Care (Signed)
  Problem: Education: Goal: Knowledge of General Education information will improve Description: Including pain rating scale, medication(s)/side effects and non-pharmacologic comfort measures Outcome: Progressing   Problem: Health Behavior/Discharge Planning: Goal: Ability to manage health-related needs will improve Outcome: Progressing   Problem: Clinical Measurements: Goal: Ability to maintain clinical measurements within normal limits will improve Outcome: Progressing Goal: Will remain free from infection Outcome: Progressing Goal: Diagnostic test results will improve Outcome: Progressing Goal: Respiratory complications will improve Outcome: Progressing Goal: Cardiovascular complication will be avoided Outcome: Progressing   Problem: Nutrition: Goal: Adequate nutrition will be maintained Outcome: Progressing   Problem: Safety: Goal: Ability to remain free from injury will improve Outcome: Progressing   Problem: Clinical Measurements: Goal: Postoperative complications will be avoided or minimized Outcome: Progressing   Problem: Skin Integrity: Goal: Will show signs of wound healing Outcome: Progressing

## 2023-04-25 NOTE — TOC Progression Note (Addendum)
Transition of Care [facility]) - Progression Note    Patient Details  Name: Dean Mayer MRN: [id number] Date of Birth: Jan 16, 1941  [facility] CM/SW Contact  Lorri Frederick, LCSW Phone Number: 04/25/2023, 1:00 PM  Clinical Narrative:   SNF auth approved: 9/19 - 1/47 [id number]. MD informed.    [facility] unable to receive pt today, will have to be tomorrow.  MD informed.   Pt wife updated.  Expected Discharge Plan: Skilled Nursing Facility Barriers to Discharge: Continued Medical Work up  Expected Discharge Plan and Services In-house Referral: Clinical Social Work   Post Acute Care Choice: Skilled Nursing Facility Living arrangements for the past 2 months: Single Family Home Expected Discharge Date: 04/25/23               DME Arranged: N/A DME Agency: NA                   Social Determinants of Health (SDOH) Interventions SDOH Screenings   Food Insecurity: No Food Insecurity (04/17/2023)  Housing: Low Risk  (04/17/2023)  Transportation Needs: No Transportation Needs (04/17/2023)  Utilities: Not At Risk (04/17/2023)  Financial Resource Strain: Low Risk  (11/19/2021)   Received from [facility], [facility]  Physical Activity: Inactive (11/19/2021)   Received from [facility], [facility]  Social Connections: Unknown (11/26/2022)   [facility]  Stress: No Stress Concern Present (03/16/2022)   Received from [facility], [facility]  Tobacco Use: Medium Risk (04/19/2023)    Readmission Risk Interventions    04/18/2023    9:55 AM  Readmission Risk Prevention Plan  Transportation Screening Complete  Medication Review (RN Care Manager) Complete  HRI or Home Care Consult Complete  SW Recovery Care/Counseling Consult Complete  Palliative Care Screening Not Applicable  Skilled Nursing Facility Not Complete  SNF Comments Awaiting PT evaluation following surgery.

## 2023-04-26 ENCOUNTER — Other Ambulatory Visit (HOSPITAL_COMMUNITY): Payer: Self-pay

## 2023-04-26 DIAGNOSIS — I10 Essential (primary) hypertension: Secondary | ICD-10-CM | POA: Diagnosis not present

## 2023-04-26 DIAGNOSIS — S72002A Fracture of unspecified part of neck of left femur, initial encounter for closed fracture: Secondary | ICD-10-CM | POA: Diagnosis not present

## 2023-04-26 DIAGNOSIS — N401 Enlarged prostate with lower urinary tract symptoms: Secondary | ICD-10-CM | POA: Diagnosis not present

## 2023-04-26 DIAGNOSIS — E039 Hypothyroidism, unspecified: Secondary | ICD-10-CM | POA: Diagnosis not present

## 2023-04-26 MED ORDER — HYDROCODONE-ACETAMINOPHEN 5-325 MG PO TABS: 1.0000 | ORAL_TABLET | ORAL | 0 refills | Status: AC | PRN

## 2023-04-26 MED ORDER — LINEZOLID 600 MG PO TABS
600.0000 mg | ORAL_TABLET | Freq: Two times a day (BID) | ORAL | 0 refills | Status: AC
  Filled 2023-04-26: qty 6, 3d supply, fill #0

## 2023-04-26 NOTE — Plan of Care (Signed)
  Problem: Education: Goal: Knowledge of General Education information will improve Description: Including pain rating scale, medication(s)/side effects and non-pharmacologic comfort measures Outcome: Adequate for Discharge   Problem: Health Behavior/Discharge Planning: Goal: Ability to manage health-related needs will improve Outcome: Adequate for Discharge   Problem: Clinical Measurements: Goal: Ability to maintain clinical measurements within normal limits will improve Outcome: Adequate for Discharge Goal: Will remain free from infection Outcome: Adequate for Discharge Goal: Diagnostic test results will improve Outcome: Adequate for Discharge Goal: Respiratory complications will improve Outcome: Adequate for Discharge Goal: Cardiovascular complication will be avoided Outcome: Adequate for Discharge   Problem: Activity: Goal: Risk for activity intolerance will decrease Outcome: Adequate for Discharge   Problem: Nutrition: Goal: Adequate nutrition will be maintained Outcome: Adequate for Discharge   Problem: Coping: Goal: Level of anxiety will decrease Outcome: Adequate for Discharge   Problem: Elimination: Goal: Will not experience complications related to bowel motility Outcome: Adequate for Discharge Goal: Will not experience complications related to urinary retention Outcome: Adequate for Discharge   Problem: Pain Managment: Goal: General experience of comfort will improve Outcome: Adequate for Discharge   Problem: Safety: Goal: Ability to remain free from injury will improve Outcome: Adequate for Discharge   Problem: Skin Integrity: Goal: Risk for impaired skin integrity will decrease Outcome: Adequate for Discharge   Problem: Education: Goal: Verbalization of understanding the information provided (i.e., activity precautions, restrictions, etc) will improve Outcome: Adequate for Discharge Goal: Individualized Educational Video(s) Outcome: Adequate for  Discharge   Problem: Activity: Goal: Ability to ambulate and perform ADLs will improve Outcome: Adequate for Discharge   Problem: Clinical Measurements: Goal: Postoperative complications will be avoided or minimized Outcome: Adequate for Discharge   Problem: Self-Concept: Goal: Ability to maintain and perform role responsibilities to the fullest extent possible will improve Outcome: Adequate for Discharge   Problem: Pain Management: Goal: Pain level will decrease Outcome: Adequate for Discharge   Problem: Education: Goal: Knowledge of the prescribed therapeutic regimen will improve Outcome: Adequate for Discharge Goal: Understanding of discharge needs will improve Outcome: Adequate for Discharge Goal: Individualized Educational Video(s) Outcome: Adequate for Discharge   Problem: Activity: Goal: Ability to avoid complications of mobility impairment will improve Outcome: Adequate for Discharge Goal: Ability to tolerate increased activity will improve Outcome: Adequate for Discharge   Problem: Clinical Measurements: Goal: Postoperative complications will be avoided or minimized Outcome: Adequate for Discharge   Problem: Pain Management: Goal: Pain level will decrease with appropriate interventions Outcome: Adequate for Discharge   Problem: Skin Integrity: Goal: Will show signs of wound healing Outcome: Adequate for Discharge

## 2023-04-26 NOTE — Plan of Care (Signed)
Problem: Education: Goal: Knowledge of General Education information will improve Description: Including pain rating scale, medication(s)/side effects and non-pharmacologic comfort measures Outcome: Progressing   Problem: Health Behavior/Discharge Planning: Goal: Ability to manage health-related needs will improve Outcome: Progressing   Problem: Clinical Measurements: Goal: Ability to maintain clinical measurements within normal limits will improve Outcome: Progressing Goal: Will remain free from infection Outcome: Progressing Goal: Diagnostic test results will improve Outcome: Progressing Goal: Respiratory complications will improve Outcome: Progressing Goal: Cardiovascular complication will be avoided Outcome: Progressing   Problem: Activity: Goal: Risk for activity intolerance will decrease Outcome: Progressing   Problem: Nutrition: Goal: Adequate nutrition will be maintained Outcome: Progressing   Problem: Coping: Goal: Level of anxiety will decrease Outcome: Progressing   Problem: Elimination: Goal: Will not experience complications related to bowel motility Outcome: Progressing Goal: Will not experience complications related to urinary retention Outcome: Progressing   Problem: Pain Managment: Goal: General experience of comfort will improve Outcome: Progressing   Problem: Safety: Goal: Ability to remain free from injury will improve Outcome: Progressing   Problem: Skin Integrity: Goal: Risk for impaired skin integrity will decrease Outcome: Progressing   Problem: Education: Goal: Verbalization of understanding the information provided (i.e., activity precautions, restrictions, etc) will improve Outcome: Progressing Goal: Individualized Educational Video(s) Outcome: Progressing   Problem: Activity: Goal: Ability to ambulate and perform ADLs will improve Outcome: Progressing   Problem: Clinical Measurements: Goal: Postoperative complications will be  avoided or minimized Outcome: Progressing   Problem: Self-Concept: Goal: Ability to maintain and perform role responsibilities to the fullest extent possible will improve Outcome: Progressing   Problem: Pain Management: Goal: Pain level will decrease Outcome: Progressing   Problem: Education: Goal: Knowledge of the prescribed therapeutic regimen will improve Outcome: Progressing Goal: Understanding of discharge needs will improve Outcome: Progressing Goal: Individualized Educational Video(s) Outcome: Progressing   Problem: Activity: Goal: Ability to avoid complications of mobility impairment will improve Outcome: Progressing Goal: Ability to tolerate increased activity will improve Outcome: Progressing   Problem: Clinical Measurements: Goal: Postoperative complications will be avoided or minimized Outcome: Progressing   Problem: Pain Management: Goal: Pain level will decrease with appropriate interventions Outcome: Progressing   Problem: Skin Integrity: Goal: Will show signs of wound healing Outcome: Progressing

## 2023-04-26 NOTE — Care Management Important Message (Signed)
Important Message  Patient Details  Name: Dean Mayer MRN: [id number] Date of Birth: Oct 06, 1940   Medicare Important Message Given:  Yes     Sherilyn Banker 04/26/2023, 3:38 PM

## 2023-04-26 NOTE — TOC Transition Note (Signed)
Transition of Care [facility]) - CM/SW Discharge Note   Patient Details  Name: Dean Mayer MRN: [id number] Date of Birth: 1940-12-06  [facility] of [facility]) CM/SW Contact:  Lorri Frederick, LCSW Phone Number: 04/26/2023, 9:51 AM   Clinical Narrative:   Pt discharging to [facility].  RN call report to [phone].    [id number] confirmed with [facility] that they can receive pt today   Final next level of care: Skilled Nursing Facility Barriers to Discharge: Barriers Resolved   Patient Goals and CMS Choice CMS Medicare.gov Compare Post Acute Care list provided to:: Patient Choice offered to / list presented to : Patient, Spouse  Discharge Placement                Patient chooses bed at:  [facility]) Patient to be transferred to facility by: ptar Name of family member notified: wife Boneta Lucks Patient and family notified of of transfer: 04/26/23  Discharge Plan and Services Additional resources added to the After Visit Summary for   In-house Referral: Clinical Social Work   Post Acute Care Choice: Skilled Nursing Facility          DME Arranged: N/A DME Agency: NA                  Social Determinants of Health (SDOH) Interventions SDOH Screenings   Food Insecurity: No Food Insecurity (04/17/2023)  Housing: Low Risk  (04/17/2023)  Transportation Needs: No Transportation Needs (04/17/2023)  Utilities: Not At Risk (04/17/2023)  Financial Resource Strain: Low Risk  (11/19/2021)   Received from [facility]  Physical Activity: Inactive (11/19/2021)   Received from [facility], [facility]  Social Connections: Unknown (11/26/2022)   Received from [facility], [facility]  Stress: No Stress Concern Present (03/16/2022)   Received from [facility], [facility]  Tobacco Use: Medium Risk (04/19/2023)     Readmission Risk Interventions    04/18/2023    9:55 AM  Readmission Risk Prevention Plan  Transportation Screening Complete   Medication Review (RN Care Manager) Complete  HRI or Home Care Consult Complete  SW Recovery Care/Counseling Consult Complete  Palliative Care Screening Not Applicable  Skilled Nursing Facility Not Complete  SNF Comments Awaiting PT evaluation following surgery.

## 2023-04-26 NOTE — Progress Notes (Signed)
Patient discharged, report called and given to the nurse Marcelino Duster, RN at [facility]. No additional questions and or concerns at this time. Patient IV removed per protocol. Patient awaiting PTAR to arrive to discharge home.

## 2023-04-26 NOTE — Discharge Summary (Signed)
Physician Discharge Summary   Patient: Dean Mayer MRN: [id number] DOB: 1940-09-21  Admit date:     04/17/2023  Discharge date: 04/26/23  Discharge Physician: Kathlen Mody   PCP: Angelique Blonder, MD   Recommendations at discharge:  Please follow up with orthopedics as recommended.   Discharge Diagnoses: Principal Problem:   Left displaced femoral neck fracture (HCC) Active Problems:   Hypertension   Hypothyroidism   BPH with obstruction/lower urinary tract symptoms   Septic arthritis of knee (HCC)   Protein-calorie malnutrition, severe    Hospital Course:  82 year old man PMH of throat cancer, gout with recent hospitalization for MSSA bacteremia and left knee septic arthritis with discharge 8/21, with plan for IV antibiotics through September 22 followed by Dr. Algis Liming, presented 9/11 after a fall resulting in left hip fracture. Fall occurred in the context of a dizzy spell, has had multiple dizzy spells over the last couple of weeks as well as significant weight loss since developing knee pain. Orthopedics consulted. Patient had surgery/L THA on 9/13.    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 Sherral Hammers from vascular, he reviewed his chart and previous CT scan, advised no further inpatient workup at this time and needs outpatient follow-up   Stable small bilateral pulmonary nodules up to 5 mm in June 2024  Recommendation was for 12 months follow-up if high risk   Abnormal ABI 12/03/2022- non compressible vessels   Chronic back pain Pain management   Prediabetes Recent A1c 5.9    Hypothyroidism Continue Synthroid   Essential hypertension BP better controlled.    Hyperlipidemia Continue atorvastatin   BPH Continue Flomax           Consultants: orthopedics.  Procedures performed:   Disposition: Skilled nursing facility Diet recommendation:  Regular diet DISCHARGE MEDICATION: Allergies as of 04/26/2023   No Known Allergies      Medication List     STOP taking these medications    amLODipine 10 MG tablet Commonly known as: NORVASC   aspirin EC 325 MG tablet Replaced by: aspirin 81 MG chewable  to auscultation. Respiratory effort normal. Cardiovascular system: S1 & S2 heard, RRR.  Gastrointestinal system: Abdomen is nondistended, soft and nontender.  Central nervous system: Alert and oriented. No focal neurological deficits. Extremities: Symmetric 5 x 5 power. Skin: No rashes, Psychiatry:Mood & affect appropriate.     Condition at discharge: fair  The results of significant diagnostics from this hospitalization (including imaging, microbiology, ancillary and laboratory) are listed below for reference.   Imaging Studies: ECHOCARDIOGRAM LIMITED  Result Date: 04/20/2023    ECHOCARDIOGRAM LIMITED REPORT   Patient Name:   Dean Mayer Date of Exam: 04/20/2023 Medical Rec #:  [id number]         Height:       76.0 in Accession #:    [id number]        Weight:       200.0 lb Date of Birth:  [birth year]         BSA:          2.215 m Patient Age:    82 years          BP:           134/51 mmHg Patient Gender: M                 HR:           80 bpm. Exam Location:  Inpatient  Procedure: Limited Echo and Color Doppler Indications:    Bacteremia  History:        Patient has prior history of Echocardiogram examinations, most                 recent 03/17/2023. Stroke, Signs/Symptoms:Bacteremia; Risk                 Factors:Hypertension.  Sonographer:    Milbert Coulter Referring Phys: [id number] MWUXL SWINTECK  Sonographer Comments: Technically challenging study due to limited acoustic windows, suboptimal parasternal window, suboptimal apical window and suboptimal subcostal window. Image acquisition challenging due to respiratory motion and Image acquisition challenging due to patient body habitus. IMPRESSIONS  1. Very limited study. Grossly normal left and right ventricular function. There are mild degenerative changes and calcification of the aortic leaflets and mitral annulus. No major valvular abnormalities are seen. Conclusion(s)/Recommendation(s): No evidence of valvular vegetations on this transthoracic echocardiogram. Consider a transesophageal echocardiogram to exclude infective endocarditis if clinically indicated. FINDINGS  Additional Comments: Color Doppler performed.  Thurmon Fair MD Electronically signed by Thurmon Fair MD Signature Date/Time: 04/20/2023/12:14:48 PM    Final    DG HIP UNILAT WITH PELVIS 1V LEFT  Result Date: 04/19/2023 CLINICAL DATA:  Left hip fracture. EXAM: DG HIP (WITH OR WITHOUT PELVIS) 1V*L*; DG C-ARM 1-60 MIN-NO REPORT Radiation exposure index: 2.65 mGy. COMPARISON:  April 17, 2023. FINDINGS: Four intraoperative fluoroscopic images were obtained of the left hip. The femoral and acetabular components are well situated. IMPRESSION: Fluoroscopic guidance provided during left total hip arthroplasty. Electronically Signed   By: Lupita Raider M.D.   On: 04/19/2023 14:58   DG Pelvis Portable  Result Date: 04/19/2023 CLINICAL DATA:  Left femur fracture. EXAM: PORTABLE PELVIS 1-2 VIEWS COMPARISON:  April 17, 2023. FINDINGS: Status post left total hip  arthroplasty for treatment of proximal left femoral fracture. Acetabular and femoral components are well situated. Expected postoperative changes are noted in the surrounding soft tissues. IMPRESSION: Status post left total hip arthroplasty. Electronically Signed   By: Lupita Raider M.D.   On: 04/19/2023 14:55   DG C-Arm  1-60 Min-No Report  Result Date: 04/19/2023 Fluoroscopy was utilized by the requesting physician.  No radiographic interpretation.   DG C-Arm 1-60 Min-No Report  Result Date: 04/19/2023 Fluoroscopy was utilized by the requesting physician.  No radiographic interpretation.   VAS US CAROTID  Result Date: 04/18/2023 Carotid Arterial Duplex Study Patient Name:  LUCY SACRE  Date of Exam:   04/18/2023 Medical Rec #: [id number]          Accession #:    [id number] Date of Birth: [birth year]          Patient Gender: M Patient Age:   70 years Exam Location:  [facility] Procedure:      VAS US CAROTID Referring Phys: [facility] --------------------------------------------------------------------------------  Indications:       Syncope. Right ICA/carotid bulb stent placed at outside                    facility 08-28-2021. 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 Risk Factors:      Hypertension, hyperlipidemia, past history of smoking, prior                    CVA. Comparison Study:  03-29-2022 Prior carotid duplex at outside facility with no                    evidence of restenosis of the right ICA/bulb. 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 Performing Technologist: Jean Rosenthal RDMS, RVT  Examination Guidelines: A complete evaluation includes B-mode imaging, spectral Doppler, color Doppler, and power Doppler as needed of all accessible portions of each vessel. Bilateral testing is considered an integral part of a complete examination. Limited examinations for  reoccurring indications may be performed as noted.  Right Carotid Findings: +----------+--------+--------+--------+------------------+------------------+           PSV cm/sEDV cm/sStenosisPlaque DescriptionComments           +----------+--------+--------+--------+------------------+------------------+ CCA Prox  206     26                                                   +----------+--------+--------+--------+------------------+------------------+ CCA Distal52      9                                 intimal thickening +----------+--------+--------+--------+------------------+------------------+ ICA Prox                                            Stent              +----------+--------+--------+--------+------------------+------------------+ ICA Distal86      15                                                   +----------+--------+--------+--------+------------------+------------------+  to auscultation. Respiratory effort normal. Cardiovascular system: S1 & S2 heard, RRR.  Gastrointestinal system: Abdomen is nondistended, soft and nontender.  Central nervous system: Alert and oriented. No focal neurological deficits. Extremities: Symmetric 5 x 5 power. Skin: No rashes, Psychiatry:Mood & affect appropriate.     Condition at discharge: fair  The results of significant diagnostics from this hospitalization (including imaging, microbiology, ancillary and laboratory) are listed below for reference.   Imaging Studies: ECHOCARDIOGRAM LIMITED  Result Date: 04/20/2023    ECHOCARDIOGRAM LIMITED REPORT   Patient Name:   Dean Mayer Date of Exam: 04/20/2023 Medical Rec #:  [id number]         Height:       76.0 in Accession #:    [id number]        Weight:       200.0 lb Date of Birth:  [birth year]         BSA:          2.215 m Patient Age:    82 years          BP:           134/51 mmHg Patient Gender: M                 HR:           80 bpm. Exam Location:  Inpatient  Procedure: Limited Echo and Color Doppler Indications:    Bacteremia  History:        Patient has prior history of Echocardiogram examinations, most                 recent 03/17/2023. Stroke, Signs/Symptoms:Bacteremia; Risk                 Factors:Hypertension.  Sonographer:    Milbert Coulter Referring Phys: [id number] MWUXL SWINTECK  Sonographer Comments: Technically challenging study due to limited acoustic windows, suboptimal parasternal window, suboptimal apical window and suboptimal subcostal window. Image acquisition challenging due to respiratory motion and Image acquisition challenging due to patient body habitus. IMPRESSIONS  1. Very limited study. Grossly normal left and right ventricular function. There are mild degenerative changes and calcification of the aortic leaflets and mitral annulus. No major valvular abnormalities are seen. Conclusion(s)/Recommendation(s): No evidence of valvular vegetations on this transthoracic echocardiogram. Consider a transesophageal echocardiogram to exclude infective endocarditis if clinically indicated. FINDINGS  Additional Comments: Color Doppler performed.  Thurmon Fair MD Electronically signed by Thurmon Fair MD Signature Date/Time: 04/20/2023/12:14:48 PM    Final    DG HIP UNILAT WITH PELVIS 1V LEFT  Result Date: 04/19/2023 CLINICAL DATA:  Left hip fracture. EXAM: DG HIP (WITH OR WITHOUT PELVIS) 1V*L*; DG C-ARM 1-60 MIN-NO REPORT Radiation exposure index: 2.65 mGy. COMPARISON:  April 17, 2023. FINDINGS: Four intraoperative fluoroscopic images were obtained of the left hip. The femoral and acetabular components are well situated. IMPRESSION: Fluoroscopic guidance provided during left total hip arthroplasty. Electronically Signed   By: Lupita Raider M.D.   On: 04/19/2023 14:58   DG Pelvis Portable  Result Date: 04/19/2023 CLINICAL DATA:  Left femur fracture. EXAM: PORTABLE PELVIS 1-2 VIEWS COMPARISON:  April 17, 2023. FINDINGS: Status post left total hip  arthroplasty for treatment of proximal left femoral fracture. Acetabular and femoral components are well situated. Expected postoperative changes are noted in the surrounding soft tissues. IMPRESSION: Status post left total hip arthroplasty. Electronically Signed   By: Lupita Raider M.D.   On: 04/19/2023 14:55   DG C-Arm  to auscultation. Respiratory effort normal. Cardiovascular system: S1 & S2 heard, RRR.  Gastrointestinal system: Abdomen is nondistended, soft and nontender.  Central nervous system: Alert and oriented. No focal neurological deficits. Extremities: Symmetric 5 x 5 power. Skin: No rashes, Psychiatry:Mood & affect appropriate.     Condition at discharge: fair  The results of significant diagnostics from this hospitalization (including imaging, microbiology, ancillary and laboratory) are listed below for reference.   Imaging Studies: ECHOCARDIOGRAM LIMITED  Result Date: 04/20/2023    ECHOCARDIOGRAM LIMITED REPORT   Patient Name:   Dean Mayer Date of Exam: 04/20/2023 Medical Rec #:  [id number]         Height:       76.0 in Accession #:    [id number]        Weight:       200.0 lb Date of Birth:  [birth year]         BSA:          2.215 m Patient Age:    82 years          BP:           134/51 mmHg Patient Gender: M                 HR:           80 bpm. Exam Location:  Inpatient  Procedure: Limited Echo and Color Doppler Indications:    Bacteremia  History:        Patient has prior history of Echocardiogram examinations, most                 recent 03/17/2023. Stroke, Signs/Symptoms:Bacteremia; Risk                 Factors:Hypertension.  Sonographer:    Milbert Coulter Referring Phys: [id number] MWUXL SWINTECK  Sonographer Comments: Technically challenging study due to limited acoustic windows, suboptimal parasternal window, suboptimal apical window and suboptimal subcostal window. Image acquisition challenging due to respiratory motion and Image acquisition challenging due to patient body habitus. IMPRESSIONS  1. Very limited study. Grossly normal left and right ventricular function. There are mild degenerative changes and calcification of the aortic leaflets and mitral annulus. No major valvular abnormalities are seen. Conclusion(s)/Recommendation(s): No evidence of valvular vegetations on this transthoracic echocardiogram. Consider a transesophageal echocardiogram to exclude infective endocarditis if clinically indicated. FINDINGS  Additional Comments: Color Doppler performed.  Thurmon Fair MD Electronically signed by Thurmon Fair MD Signature Date/Time: 04/20/2023/12:14:48 PM    Final    DG HIP UNILAT WITH PELVIS 1V LEFT  Result Date: 04/19/2023 CLINICAL DATA:  Left hip fracture. EXAM: DG HIP (WITH OR WITHOUT PELVIS) 1V*L*; DG C-ARM 1-60 MIN-NO REPORT Radiation exposure index: 2.65 mGy. COMPARISON:  April 17, 2023. FINDINGS: Four intraoperative fluoroscopic images were obtained of the left hip. The femoral and acetabular components are well situated. IMPRESSION: Fluoroscopic guidance provided during left total hip arthroplasty. Electronically Signed   By: Lupita Raider M.D.   On: 04/19/2023 14:58   DG Pelvis Portable  Result Date: 04/19/2023 CLINICAL DATA:  Left femur fracture. EXAM: PORTABLE PELVIS 1-2 VIEWS COMPARISON:  April 17, 2023. FINDINGS: Status post left total hip  arthroplasty for treatment of proximal left femoral fracture. Acetabular and femoral components are well situated. Expected postoperative changes are noted in the surrounding soft tissues. IMPRESSION: Status post left total hip arthroplasty. Electronically Signed   By: Lupita Raider M.D.   On: 04/19/2023 14:55   DG C-Arm  to auscultation. Respiratory effort normal. Cardiovascular system: S1 & S2 heard, RRR.  Gastrointestinal system: Abdomen is nondistended, soft and nontender.  Central nervous system: Alert and oriented. No focal neurological deficits. Extremities: Symmetric 5 x 5 power. Skin: No rashes, Psychiatry:Mood & affect appropriate.     Condition at discharge: fair  The results of significant diagnostics from this hospitalization (including imaging, microbiology, ancillary and laboratory) are listed below for reference.   Imaging Studies: ECHOCARDIOGRAM LIMITED  Result Date: 04/20/2023    ECHOCARDIOGRAM LIMITED REPORT   Patient Name:   Dean Mayer Date of Exam: 04/20/2023 Medical Rec #:  [id number]         Height:       76.0 in Accession #:    [id number]        Weight:       200.0 lb Date of Birth:  [birth year]         BSA:          2.215 m Patient Age:    82 years          BP:           134/51 mmHg Patient Gender: M                 HR:           80 bpm. Exam Location:  Inpatient  Procedure: Limited Echo and Color Doppler Indications:    Bacteremia  History:        Patient has prior history of Echocardiogram examinations, most                 recent 03/17/2023. Stroke, Signs/Symptoms:Bacteremia; Risk                 Factors:Hypertension.  Sonographer:    Milbert Coulter Referring Phys: [id number] MWUXL SWINTECK  Sonographer Comments: Technically challenging study due to limited acoustic windows, suboptimal parasternal window, suboptimal apical window and suboptimal subcostal window. Image acquisition challenging due to respiratory motion and Image acquisition challenging due to patient body habitus. IMPRESSIONS  1. Very limited study. Grossly normal left and right ventricular function. There are mild degenerative changes and calcification of the aortic leaflets and mitral annulus. No major valvular abnormalities are seen. Conclusion(s)/Recommendation(s): No evidence of valvular vegetations on this transthoracic echocardiogram. Consider a transesophageal echocardiogram to exclude infective endocarditis if clinically indicated. FINDINGS  Additional Comments: Color Doppler performed.  Thurmon Fair MD Electronically signed by Thurmon Fair MD Signature Date/Time: 04/20/2023/12:14:48 PM    Final    DG HIP UNILAT WITH PELVIS 1V LEFT  Result Date: 04/19/2023 CLINICAL DATA:  Left hip fracture. EXAM: DG HIP (WITH OR WITHOUT PELVIS) 1V*L*; DG C-ARM 1-60 MIN-NO REPORT Radiation exposure index: 2.65 mGy. COMPARISON:  April 17, 2023. FINDINGS: Four intraoperative fluoroscopic images were obtained of the left hip. The femoral and acetabular components are well situated. IMPRESSION: Fluoroscopic guidance provided during left total hip arthroplasty. Electronically Signed   By: Lupita Raider M.D.   On: 04/19/2023 14:58   DG Pelvis Portable  Result Date: 04/19/2023 CLINICAL DATA:  Left femur fracture. EXAM: PORTABLE PELVIS 1-2 VIEWS COMPARISON:  April 17, 2023. FINDINGS: Status post left total hip  arthroplasty for treatment of proximal left femoral fracture. Acetabular and femoral components are well situated. Expected postoperative changes are noted in the surrounding soft tissues. IMPRESSION: Status post left total hip arthroplasty. Electronically Signed   By: Lupita Raider M.D.   On: 04/19/2023 14:55   DG C-Arm

## 2023-05-08 ENCOUNTER — Ambulatory Visit (INDEPENDENT_AMBULATORY_CARE_PROVIDER_SITE_OTHER): Payer: Medicare HMO | Admitting: Infectious Disease

## 2023-05-08 ENCOUNTER — Telehealth: Payer: Self-pay

## 2023-05-08 ENCOUNTER — Encounter: Payer: Self-pay | Admitting: Infectious Disease

## 2023-05-08 ENCOUNTER — Other Ambulatory Visit: Payer: Self-pay

## 2023-05-08 VITALS — BP 107/68 | HR 91 | Resp 17 | Ht 76.0 in | Wt 200.0 lb

## 2023-05-08 DIAGNOSIS — B9561 Methicillin susceptible Staphylococcus aureus infection as the cause of diseases classified elsewhere: Secondary | ICD-10-CM

## 2023-05-08 DIAGNOSIS — R7881 Bacteremia: Secondary | ICD-10-CM

## 2023-05-08 DIAGNOSIS — M00062 Staphylococcal arthritis, left knee: Secondary | ICD-10-CM | POA: Diagnosis not present

## 2023-05-08 DIAGNOSIS — Z96642 Presence of left artificial hip joint: Secondary | ICD-10-CM

## 2023-05-08 DIAGNOSIS — I951 Orthostatic hypotension: Secondary | ICD-10-CM | POA: Diagnosis not present

## 2023-05-08 NOTE — Progress Notes (Signed)
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   MRN: [id number]  HPI  Dean Mayer is an 82 year old man who was admitted to hospital with MSSA bacteremia secondary left septic knee arthritis.  His septic arthritis is likely already present when he had aspirate the knee in July with had greater than 75,000 white cells and crystals were seen but no cultures were obtained.  His blood cultures cleared and he had a successful I&D with Dr. Jena Gauss.  We did not pursue transesophageal echocardiogram given the fact that he has dysphagia and history of throat cancer.  Last saw him was on course to complete 6 weeks of antibiotics in 22 September.  Apparently he fell due to orthostatic hypotension and a syncopal event and fractured his left hip requiring total hip arthroplasty.  This went well but he still has pain there and he is quite depressed still he is lost 40 pounds.  He is off all antihypertensive medications with the exception of Flomax which he takes for his enlarged prostate.  He is still in a facility and they are still trying to tackle this issue of orthostatic hypotension.   His knee is not bothering him at all currently.  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/10/2023   Throat cancer [facility])     Past Surgical History:  Procedure Laterality Date   CAROTID STENT     EYE SURGERY     I & D EXTREMITY Left 03/15/2023   Procedure: IRRIGATION AND DEBRIDEMENT LEFT KNEE;  Surgeon: Roby Lofts, MD;  Location: [facility] OR;  Service: Orthopedics;  Laterality: Left;   TONSILLECTOMY     TOTAL HIP ARTHROPLASTY Left 04/19/2023   Procedure: TOTAL HIP ARTHROPLASTY ANTERIOR  APPROACH;  Surgeon: Samson Frederic, MD;  Location: [facility] OR;  Service: Orthopedics;  Laterality: Left;    Family History  Problem Relation Age of Onset   Stroke Mother    Heart attack Mother    Heart disease Brother       Social History   Socioeconomic History   Marital status: Married    Spouse name: Not on file   Number of children: Not on file   Years of education: Not on file   Highest education level: Not on file  Occupational History   Not on file  Tobacco Use   Smoking status: Former    Current packs/day: 20.00    Average packs/day: 13.6 packs/day for 59.8 years (815.0 ttl pk-yrs)    Types: Cigarettes    Start date: 1965    Passive exposure: Past   Smokeless tobacco: Never  Vaping Use   Vaping status: Never Used  Substance and Sexual Activity   Alcohol use: No   Drug use: No   Sexual activity: Not on file  Other Topics Concern   Not on file  Social History Narrative   Not on file   Social Determinants of Health   Financial Resource Strain: Low Risk  (11/19/2021)   Received from [facility], [facility]   Overall Financial Resource Strain (CARDIA)    Difficulty of Paying Living Expenses: Not hard at all  Food Insecurity: No Food Insecurity (04/17/2023)   Hunger Vital Sign    Worried About Running Out of Food in the Last Year: Never true    Ran Out of Food in the Last Year: Never true  Transportation Needs: No Transportation Needs (04/17/2023)   PRAPARE - Administrator, Civil Service (Medical): No    Lack of Transportation (Non-Medical): No  Physical Activity: Inactive (11/19/2021)   Received from [facility], [facility]   Exercise Vital Sign    Days of Exercise per Week: 0 days    Minutes of Exercise per Session: 0 min  Stress: No Stress Concern Present (03/16/2022)   Received from [facility], [facility] of Occupational Health - Occupational Stress Questionnaire    Feeling of Stress : Not at all  Social  Connections: Unknown (11/26/2022)   Received from [facility], [facility]   Social Network    Social Network: Not on file    No Known Allergies   Current Outpatient Medications:    acetaminophen (TYLENOL) 500 MG tablet, Take 1,000 mg by mouth every 12 (twelve) hours as needed for mild pain or moderate pain., Disp: , Rfl:    allopurinol (ZYLOPRIM) 100 MG tablet, Take 1 tablet (100 mg total) by mouth daily., Disp: 30 tablet, Rfl: 0   ascorbic acid (VITAMIN C) 500 MG tablet, Take 1,000 mg by mouth daily., Disp: , Rfl:    aspirin 81 MG chewable tablet, Chew 1 tablet (81 mg total) by mouth 2 (two) times daily., Disp: 90 tablet, Rfl: 0   atorvastatin (LIPITOR) 40 MG tablet, Take 40 mg by mouth at bedtime., Disp: , Rfl:    cyanocobalamin 1000 MCG tablet, Take 1 tablet (1,000 mcg total) by mouth daily., Disp: , Rfl:    docusate sodium (COLACE) 100 MG capsule, Take 1 capsule (100 mg total) by mouth 2 (two) times daily., Disp: 100 capsule, Rfl: 0   ferrous sulfate 325 (65 FE) MG tablet, Take 325 mg by mouth daily with breakfast., Disp: , Rfl:    fluticasone (FLONASE) 50 MCG/ACT nasal spray, Place 2 sprays into both nostrils daily as needed for allergies., Disp: , Rfl:    lactose free nutrition (BOOST) LIQD, Take 240 mLs by mouth 2 (two) times daily., Disp: , Rfl:    levothyroxine (SYNTHROID) 112 MCG tablet, Take 112 mcg by mouth daily before breakfast., Disp: , Rfl:    lisinopril (ZESTRIL) 5 MG tablet, Take 5 mg by mouth See admin instructions. Take 5 mg by mouth at 8 AM and 8 PM- hold for a Systolic number less than 110 or Diastolic number less than 60, Disp: , Rfl:    methocarbamol (ROBAXIN) 500 MG tablet, Take 1 tablet (500 mg total) by mouth every 8 (eight) hours as needed for muscle spasms., Disp: 20 tablet, Rfl: 0   Multiple Vitamins-Minerals (THERA-M MULTIPLE VITAMINS PO), Take 1 tablet by mouth daily with breakfast., Disp: , Rfl:    Omega-3 Fatty Acids (FISH OIL) 1200 MG CAPS, Take 1,200 mg  by mouth daily., Disp: , Rfl:    pantoprazole (PROTONIX) 40 MG tablet, Take 40 mg by mouth daily before breakfast., Disp: , Rfl:    polyethylene glycol powder (GLYCOLAX/MIRALAX) 17 GM/SCOOP powder, Mix as directed and take 1 capful (17 g) by mouth daily as needed for mild constipation. (Patient taking differently: Take 17 g by mouth in the morning.), Disp: 238 g, Rfl: 0   senna-docusate (SENOKOT-S) 8.6-50 MG tablet, Take 1  tablet by mouth at bedtime as needed for mild constipation., Disp: 30 tablet, Rfl: 0   tamsulosin (FLOMAX) 0.4 MG CAPS capsule, Take 0.4 mg by mouth daily., Disp: , Rfl:     Review of Systems  Constitutional:  Negative for activity change, appetite change, chills, diaphoresis, fatigue, fever and unexpected weight change.  HENT:  Negative for congestion, rhinorrhea, sinus pressure, sneezing, sore throat and trouble swallowing.   Eyes:  Negative for photophobia and visual disturbance.  Respiratory:  Negative for cough, chest tightness, shortness of breath, wheezing and stridor.   Cardiovascular:  Negative for chest pain, palpitations and leg swelling.  Gastrointestinal:  Negative for abdominal distention, abdominal pain, anal bleeding, blood in stool, constipation, diarrhea, nausea and vomiting.  Genitourinary:  Negative for difficulty urinating, dysuria, flank pain and hematuria.  Musculoskeletal:  Positive for arthralgias. Negative for back pain, gait problem, joint swelling and myalgias.  Skin:  Negative for color change, pallor, rash and wound.  Neurological:  Positive for weakness. Negative for dizziness, tremors and light-headedness.  Hematological:  Negative for adenopathy. Does not bruise/bleed easily.  Psychiatric/Behavioral:  Negative for agitation, behavioral problems, confusion, decreased concentration, dysphoric mood and sleep disturbance.        Objective:   Physical Exam Constitutional:      Appearance: He is well-developed.  HENT:     Head: Normocephalic  and atraumatic.  Eyes:     Conjunctiva/sclera: Conjunctivae normal.  Cardiovascular:     Rate and Rhythm: Normal rate and regular rhythm.  Pulmonary:     Effort: Pulmonary effort is normal. No respiratory distress.     Breath sounds: No wheezing.  Abdominal:     General: There is no distension.     Palpations: Abdomen is soft.  Musculoskeletal:        General: No tenderness. Normal range of motion.     Cervical back: Normal range of motion and neck supple.  Skin:    General: Skin is warm and dry.     Coloration: Skin is not pale.     Findings: No erythema or rash.  Neurological:     General: No focal deficit present.     Mental Status: He is alert and oriented to person, place, and time.  Psychiatric:        Attention and Perception: Attention normal.        Mood and Affect: Mood is depressed. Affect is tearful.        Speech: Speech normal.        Behavior: Behavior normal.        Thought Content: Thought content normal.        Cognition and Memory: Cognition normal.        Judgment: Judgment normal.    Left knee    Knee was stable and non tender          Assessment & Plan:  MSSA bacteremia due to infected native left septic arthritis status post I&D and 6-week course of cefazolin:  We will check surveillance blood cultures today.  Also will check a sed rate CRP CMP and CBC with differential  Orthostatic hypotension:  Seems to be due to him having lost weight and takes of his severe depression and anhedonia anorexia.  Hopefully does not due to recurrence of infection the fact that he had this episode while on his cefazolin would argue against this.  I will make sure he is seen again by me at the end of this month to make sure  that he is improving  Severe depression: Being initiated on Zoloft at facility it sounds like he would be much happier if he could be home   I have personally spent 40 minutes involved in face-to-face and non-face-to-face activities  for this patient on the day of the visit. Professional time spent includes the following activities: Preparing to see the patient (review of tests), Obtaining and/or reviewing separately obtained history (admission/discharge record), Performing a medically appropriate examination and/or evaluation , Ordering medications/tests/procedures, referring and communicating with other health care professionals, Documenting clinical information in the EMR, Independently interpreting results (not separately reported), Communicating results to the patient/family/caregiver, Counseling and educating the patient/family/caregiver and Care coordination (not separately reported).

## 2023-05-08 NOTE — Telephone Encounter (Signed)
Contacted [facility] and spoke to [place]. Advised that patient had a dizzy spell during lab draw. Patient and wife refused ED but did mention getting fluids at facility. Sam reports they have initiated IV fluids since patient has returned to facility. Verified she did receive note from today and is aware of 05/30/23 10 AM follow up with Dr Daiva Eves.

## 2023-05-09 ENCOUNTER — Encounter: Payer: Self-pay | Admitting: Infectious Disease

## 2023-05-09 LAB — COMPLETE METABOLIC PANEL WITH GFR
AG Ratio: 1.4 (calc) (ref 1.0–2.5)
ALT: 8 U/L — ABNORMAL LOW (ref 9–46)
AST: 16 U/L (ref 10–35)
Albumin: 3.3 g/dL — ABNORMAL LOW (ref 3.6–5.1)
Alkaline phosphatase (APISO): 132 U/L (ref 35–144)
BUN/Creatinine Ratio: 14 (calc) (ref 6–22)
BUN: 9 mg/dL (ref 7–25)
CO2: 24 mmol/L (ref 20–32)
Calcium: 8.9 mg/dL (ref 8.6–10.3)
Chloride: 104 mmol/L (ref 98–110)
Creat: 0.63 mg/dL — ABNORMAL LOW (ref 0.70–1.22)
Globulin: 2.4 g/dL (ref 1.9–3.7)
Glucose, Bld: 137 mg/dL — ABNORMAL HIGH (ref 65–99)
Potassium: 4.3 mmol/L (ref 3.5–5.3)
Sodium: 137 mmol/L (ref 135–146)
Total Bilirubin: 0.5 mg/dL (ref 0.2–1.2)
Total Protein: 5.7 g/dL — ABNORMAL LOW (ref 6.1–8.1)
eGFR: 96 mL/min/{1.73_m2} (ref >=60)

## 2023-05-09 LAB — CBC WITH DIFFERENTIAL/PLATELET
Absolute Monocytes: 347 {cells}/uL (ref 200–950)
Basophils Absolute: 12 {cells}/uL (ref 0–200)
Basophils Relative: 0.3 %
Eosinophils Absolute: 90 {cells}/uL (ref 15–500)
Eosinophils Relative: 2.3 %
HCT: 33.9 % — ABNORMAL LOW (ref 38.5–50.0)
Hemoglobin: 10.9 g/dL — ABNORMAL LOW (ref 13.2–17.1)
Lymphs Abs: 1131 {cells}/uL (ref 850–3900)
MCH: 29.1 pg (ref 27.0–33.0)
MCHC: 32.2 g/dL (ref 32.0–36.0)
MCV: 90.4 fL (ref 80.0–100.0)
MPV: 9.2 fL (ref 7.5–12.5)
Monocytes Relative: 8.9 %
Neutro Abs: 2321 {cells}/uL (ref 1500–7800)
Neutrophils Relative %: 59.5 %
Platelets: 223 10*3/uL (ref 140–400)
RBC: 3.75 10*6/uL — ABNORMAL LOW (ref 4.20–5.80)
RDW: 17.6 % — ABNORMAL HIGH (ref 11.0–15.0)
Total Lymphocyte: 29 %
WBC: 3.9 10*3/uL (ref 3.8–10.8)

## 2023-05-09 LAB — SEDIMENTATION RATE: Sed Rate: 60 mm/h — ABNORMAL HIGH (ref 0–20)

## 2023-05-09 LAB — C-REACTIVE PROTEIN: CRP: 18.6 mg/L — ABNORMAL HIGH (ref <8.0)

## 2023-05-14 LAB — CULTURE, BLOOD (SINGLE)
MICRO NUMBER:: 15544294
MICRO NUMBER:: 15544295
Result:: NO GROWTH
SPECIMEN QUALITY:: ADEQUATE

## 2023-05-16 ENCOUNTER — Encounter: Payer: Self-pay | Admitting: Infectious Disease

## 2023-05-30 ENCOUNTER — Encounter: Payer: Self-pay | Admitting: Infectious Disease

## 2023-05-30 ENCOUNTER — Other Ambulatory Visit: Payer: Self-pay

## 2023-05-30 ENCOUNTER — Ambulatory Visit (INDEPENDENT_AMBULATORY_CARE_PROVIDER_SITE_OTHER): Payer: Medicare HMO | Admitting: Infectious Disease

## 2023-05-30 VITALS — BP 112/75 | HR 90 | Temp 97.3°F

## 2023-05-30 DIAGNOSIS — R7881 Bacteremia: Secondary | ICD-10-CM | POA: Diagnosis not present

## 2023-05-30 DIAGNOSIS — M109 Gout, unspecified: Secondary | ICD-10-CM | POA: Diagnosis not present

## 2023-05-30 DIAGNOSIS — B9561 Methicillin susceptible Staphylococcus aureus infection as the cause of diseases classified elsewhere: Secondary | ICD-10-CM

## 2023-05-30 DIAGNOSIS — M00869 Arthritis due to other bacteria, unspecified knee: Secondary | ICD-10-CM | POA: Diagnosis not present

## 2023-05-30 DIAGNOSIS — I951 Orthostatic hypotension: Secondary | ICD-10-CM | POA: Diagnosis not present

## 2023-05-30 DIAGNOSIS — Z7185 Encounter for immunization safety counseling: Secondary | ICD-10-CM

## 2023-05-30 DIAGNOSIS — Z23 Encounter for immunization: Secondary | ICD-10-CM

## 2023-05-30 HISTORY — DX: Orthostatic hypotension: I95.1

## 2023-05-30 HISTORY — DX: Gout, unspecified: M10.9

## 2023-05-30 NOTE — Progress Notes (Signed)
Subjective:  Chief complaint: Follow-up for native septic arthritis   Patient ID: Dean Mayer, male    DOB: April 25, 1941, 82 y.o.   MRN: [id number]  HPI  Dean Mayer is an 82 year old man who was admitted to hospital with MSSA bacteremia secondary left septic knee arthritis.  Interim hx:   His septic arthritis is likely already present when he had aspirate the knee in July with had greater than 75,000 white cells and crystals were seen but no cultures were obtained.  His blood cultures cleared and he had a successful I&D with Dr. Jena Gauss.  We did not pursue transesophageal echocardiogram given the fact that he has dysphagia and history of throat cancer.  Last saw him was on course to complete 6 weeks of antibiotics in 22 September.  Apparently he fell due to orthostatic hypotension and a syncopal event and fractured his left hip requiring total hip arthroplasty.  This went well but he still has pain there and he is quite depressed still he is lost 40 pounds.  He is off all antihypertensive medications with the exception of Flomax which he takes for his enlarged prostate.  He is still in a facility and they are still trying to tackle this issue of orthostatic hypotension.   His knee is not bothering him at all currently."  I last saw him he did continue to struggle with orthostatic hypotension though now it is improved and in fact the last several days has been able to do physical therapy with only a few millimeters mercury drop in his blood pressure   Knee only hurts when he does physical therapy although does hurt today in the clinic because someone was wheeling him in did not pay attention in his foot struck the ground in his knee was stressed.  He is due to see Dr. Jena Gauss I think in the next week he is also see vein and vascular with regards to abnormality seen on CT scan.    Past Medical History:  Diagnosis Date   Eye abnormalities    GERD (gastroesophageal reflux  disease)    Hypertension    Kidney stones    Prostate cancer (HCC)    Septic arthritis of knee (HCC) 04/10/2023   Throat cancer [facility])     Past Surgical History:  Procedure Laterality Date   CAROTID STENT     EYE SURGERY     I & D EXTREMITY Left 03/15/2023   Procedure: IRRIGATION AND DEBRIDEMENT LEFT KNEE;  Surgeon: Roby Lofts, MD;  Location: [facility] OR;  Service: Orthopedics;  Laterality: Left;   TONSILLECTOMY     TOTAL HIP ARTHROPLASTY Left 04/19/2023   Procedure: TOTAL HIP ARTHROPLASTY ANTERIOR APPROACH;  Surgeon: Samson Frederic, MD;  Location: [facility] OR;  Service: Orthopedics;  Laterality: Left;    Family History  Problem Relation Age of Onset   Stroke Mother    Heart attack Mother    Heart disease Brother       Social History   Socioeconomic History   Marital status: Married    Spouse name: Not on file   Number of children: Not on file   Years of education: Not on file   Highest education level: Not on file  Occupational History   Not on file  Tobacco Use   Smoking status: Former    Current packs/day: 20.00    Average packs/day: 13.6 packs/day for 59.8 years (816.2 ttl pk-yrs)    Types: Cigarettes    Start date: 70  Passive exposure: Past   Smokeless tobacco: Never  Vaping Use   Vaping status: Never Used  Substance and Sexual Activity   Alcohol use: No   Drug use: No   Sexual activity: Not on file  Other Topics Concern   Not on file  Social History Narrative   Not on file   Social Determinants of Health   Financial Resource Strain: Low Risk  (11/19/2021)   Received from [facility], [facility]   Overall Financial Resource Strain (CARDIA)    Difficulty of Paying Living Expenses: Not hard at all  Food Insecurity: No Food Insecurity (04/17/2023)   Hunger Vital Sign    Worried About Running Out of Food in the Last Year: Never true    Ran Out of Food in the Last Year: Never true  Transportation Needs: No Transportation Needs (04/17/2023)   PRAPARE -  Administrator, Civil Service (Medical): No    Lack of Transportation (Non-Medical): No  Physical Activity: Inactive (11/19/2021)   Received from [facility], [facility]   Exercise Vital Sign    Days of Exercise per Week: 0 days    Minutes of Exercise per Session: 0 min  Stress: No Stress Concern Present (03/16/2022)   Received from [facility], [facility] of Occupational Health - Occupational Stress Questionnaire    Feeling of Stress : Not at all  Social Connections: Unknown (11/26/2022)   Received from [facility], [facility]   Social Network    Social Network: Not on file    No Known Allergies   Current Outpatient Medications:    acetaminophen (TYLENOL) 500 MG tablet, Take 1,000 mg by mouth every 12 (twelve) hours as needed for mild pain or moderate pain., Disp: , Rfl:    allopurinol (ZYLOPRIM) 100 MG tablet, Take 1 tablet (100 mg total) by mouth daily., Disp: 30 tablet, Rfl: 0   ascorbic acid (VITAMIN C) 500 MG tablet, Take 1,000 mg by mouth daily., Disp: , Rfl:    aspirin 81 MG chewable tablet, Chew 1 tablet (81 mg total) by mouth 2 (two) times daily., Disp: 90 tablet, Rfl: 0   atorvastatin (LIPITOR) 40 MG tablet, Take 40 mg by mouth at bedtime., Disp: , Rfl:    cyanocobalamin 1000 MCG tablet, Take 1 tablet (1,000 mcg total) by mouth daily., Disp: , Rfl:    docusate sodium (COLACE) 100 MG capsule, Take 1 capsule (100 mg total) by mouth 2 (two) times daily., Disp: 100 capsule, Rfl: 0   ferrous sulfate 325 (65 FE) MG tablet, Take 325 mg by mouth daily with breakfast., Disp: , Rfl:    fluticasone (FLONASE) 50 MCG/ACT nasal spray, Place 2 sprays into both nostrils daily as needed for allergies., Disp: , Rfl:    levothyroxine (SYNTHROID) 112 MCG tablet, Take 112 mcg by mouth daily before breakfast., Disp: , Rfl:    methocarbamol (ROBAXIN) 500 MG tablet, Take 1 tablet (500 mg total) by mouth every 8 (eight) hours as needed for muscle spasms.,  Disp: 20 tablet, Rfl: 0   Omega-3 Fatty Acids (FISH OIL) 1200 MG CAPS, Take 1,200 mg by mouth daily., Disp: , Rfl:    pantoprazole (PROTONIX) 40 MG tablet, Take 40 mg by mouth daily before breakfast., Disp: , Rfl:    polyethylene glycol powder (GLYCOLAX/MIRALAX) 17 GM/SCOOP powder, Mix as directed and take 1 capful (17 g) by mouth daily as needed for mild constipation. (Patient taking differently: Take 17 g by mouth in  the morning.), Disp: 238 g, Rfl: 0   senna-docusate (SENOKOT-S) 8.6-50 MG tablet, Take 1 tablet by mouth at bedtime as needed for mild constipation., Disp: 30 tablet, Rfl: 0   sertraline (ZOLOFT) 50 MG tablet, Take 50 mg by mouth daily., Disp: , Rfl:    tamsulosin (FLOMAX) 0.4 MG CAPS capsule, Take 0.4 mg by mouth daily., Disp: , Rfl:    lactose free nutrition (BOOST) LIQD, Take 240 mLs by mouth 2 (two) times daily., Disp: , Rfl:    lisinopril (ZESTRIL) 5 MG tablet, Take 5 mg by mouth See admin instructions. Take 5 mg by mouth at 8 AM and 8 PM- hold for a Systolic number less than 110 or Diastolic number less than 60, Disp: , Rfl:    Multiple Vitamins-Minerals (THERA-M MULTIPLE VITAMINS PO), Take 1 tablet by mouth daily with breakfast., Disp: , Rfl:     Review of Systems  Constitutional:  Negative for chills and fever.  HENT:  Negative for congestion and sore throat.   Eyes:  Negative for photophobia.  Respiratory:  Negative for cough, shortness of breath and wheezing.   Cardiovascular:  Negative for chest pain, palpitations and leg swelling.  Gastrointestinal:  Negative for abdominal pain, blood in stool, constipation, diarrhea, nausea and vomiting.  Genitourinary:  Negative for dysuria, flank pain and hematuria.  Musculoskeletal:  Positive for joint swelling. Negative for back pain and myalgias.  Skin:  Negative for rash.  Neurological:  Negative for dizziness, weakness and headaches.  Hematological:  Does not bruise/bleed easily.  Psychiatric/Behavioral:  Negative for  agitation, behavioral problems, confusion, dysphoric mood and suicidal ideas.        Objective:   Physical Exam Constitutional:      Appearance: He is well-developed.  HENT:     Head: Normocephalic and atraumatic.  Eyes:     Conjunctiva/sclera: Conjunctivae normal.  Cardiovascular:     Rate and Rhythm: Normal rate and regular rhythm.  Pulmonary:     Effort: Pulmonary effort is normal. No respiratory distress.     Breath sounds: No wheezing.  Abdominal:     General: There is no distension.     Palpations: Abdomen is soft.  Musculoskeletal:        General: No tenderness. Normal range of motion.     Cervical back: Normal range of motion and neck supple.  Skin:    General: Skin is warm and dry.     Coloration: Skin is not pale.     Findings: No erythema or rash.  Neurological:     General: No focal deficit present.     Mental Status: He is alert and oriented to person, place, and time.  Psychiatric:        Mood and Affect: Mood normal.        Behavior: Behavior normal.        Thought Content: Thought content normal.        Judgment: Judgment normal.    Left knee    Knee was stable and non tender    Knee stable       Assessment & Plan:  MSSA bacteremia due to infected native left septic knee status post I&D and 6 weeks of cefazolin.  His surveillance blood cultures were negative and his inflammatory markers were downtrending.  I was considering repeating his inflammatory markers today but he has not episode of acute gout going on which would skew these labs.  Gout: Getting colchicine currently.  Orthostatic hypotension: Seems to have improved  dramatically since has been able to put on some weight.   Severe depression: He is on Zoloft and seems much improved to me compared to his last visit  Vaccine counseling recommended updated foot flu COVID and vaccines as well as RSV vaccination and he agreed to the former  I have personally spent 40 minutes involved  in face-to-face and non-face-to-face activities for this patient on the day of the visit. Professional time spent includes the following activities: Preparing to see the patient (review of tests), Obtaining and/or reviewing separately obtained history (admission/discharge record), Performing a medically appropriate examination and/or evaluation , Ordering medications/tests/procedures, referring and communicating with other health care professionals, Documenting clinical information in the EMR, Independently interpreting results (not separately reported), Communicating results to the patient/family/caregiver, Counseling and educating the patient/family/caregiver and Care coordination (not separately reported).

## 2023-06-19 ENCOUNTER — Ambulatory Visit: Payer: Medicare HMO | Attending: Cardiovascular Disease | Admitting: Cardiovascular Disease

## 2023-06-19 ENCOUNTER — Encounter: Payer: Self-pay | Admitting: Cardiovascular Disease

## 2023-06-19 VITALS — BP 154/70 | HR 76 | Ht 76.0 in | Wt 213.8 lb

## 2023-06-19 DIAGNOSIS — E782 Mixed hyperlipidemia: Secondary | ICD-10-CM | POA: Diagnosis not present

## 2023-06-19 DIAGNOSIS — Z8739 Personal history of other diseases of the musculoskeletal system and connective tissue: Secondary | ICD-10-CM

## 2023-06-19 DIAGNOSIS — I10 Essential (primary) hypertension: Secondary | ICD-10-CM | POA: Diagnosis not present

## 2023-06-19 DIAGNOSIS — Z85819 Personal history of malignant neoplasm of unspecified site of lip, oral cavity, and pharynx: Secondary | ICD-10-CM

## 2023-06-19 DIAGNOSIS — Z8781 Personal history of (healed) traumatic fracture: Secondary | ICD-10-CM

## 2023-06-19 DIAGNOSIS — I951 Orthostatic hypotension: Secondary | ICD-10-CM | POA: Diagnosis not present

## 2023-06-19 DIAGNOSIS — I6521 Occlusion and stenosis of right carotid artery: Secondary | ICD-10-CM

## 2023-06-19 MED ORDER — LOSARTAN POTASSIUM 25 MG PO TABS: 25.0000 mg | ORAL_TABLET | Freq: Every day | ORAL | 3 refills | Status: DC

## 2023-06-19 NOTE — Patient Instructions (Addendum)
medication Instructions:  Begin Losartan 25mg  at bedtime.  Wear 20-25mm support stockings.  If you're still experiencing dizziness purchase the 30-33mm support stockings.  If the support stockings are not helping with the dizziness, we will refer you to ENT (EAR, NOSE AND THROAT)  *If you need a refill on your cardiac medications before your next appointment, please call your pharmacy*   Lab Work: NONE If you have labs (blood work) drawn today and your tests are completely normal, you will receive your results only by: MyChart Message (if you have MyChart) OR A paper copy in the mail If you have any lab test that is abnormal or we need to change your treatment, we will call you to review the results.   Testing/Procedures: NONE   Follow-Up: At [facility], you and your health needs are our priority.  As part of our continuing mission to provide you with exceptional heart care, we have created designated Provider Care Teams.  These Care Teams include your primary Cardiologist (physician) and Advanced Practice Providers (APPs -  Physician Assistants and Nurse Practitioners) who all work together to provide you with the care you need, when you need it.  We recommend signing up for the patient portal called "MyChart".  Sign up information is provided on this After Visit Summary.  MyChart is used to connect with patients for Virtual Visits (Telemedicine).  Patients are able to view lab/test results, encounter notes, upcoming appointments, etc.  Non-urgent messages can be sent to your provider as well.   To learn more about what you can do with MyChart, go to [email or web address].    Your next appointment:   3-4 month(s)  Provider:   Azalee Course, PA-C

## 2023-06-19 NOTE — Progress Notes (Signed)
Cardiology Office Note    Date:  06/29/2023   ID:  KAIYAN LAING, DOB 08-14-1940, MRN [id number]  PCP:  Angelique Blonder, MD  Cardiologist:  Nicki Guadalajara, MD   New cardiology consultation referred by Dr. Veda Canning for orthostatic hypotension.   History of Present Illness:  Dean Mayer is a 82 y.o. 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  He has had issues with lower extremity edema and has worn support stockings. He developed gout in April 2024 and subsequently developed cellulitis treated with antibiotic therapy.  He was hospitalized in May with hematuria. In July 2024 he developed gout in his left knee and subsequently was hospitalized with MSSA bacteremia and left knee septic arthritis.  Following his hospitalization he ultimately went to [facility] rehab.  He developed dizziness in September and Leander Rams presented to the hospital after falling on September 11 resulting in left hip fracture.  He had lost approximately 40 to 50 pounds and had been having issues with multiple dizzy spells several weeks prior to his fall.  He underwent surgery for his hip fracture with total hip arthroplasty anterior approach by Dr. Samson Frederic on April 19, 2023.  He has had issues with recurrent orthostatic hypotension.  He has been evaluated by neurology by Dr. Trena Platt at [facility] and has no signs of Parkinson's disease.  He has been taken off his blood pressure medications including amlodipine as well as lisinopril.  Presently he is on atorvastatin 40 mg and omega-3 fatty acid for hyperlipidemia, levothyroxine 112 mcg for hypothyroidism.  He has been drinking nutritional supplements boost.  He has a history of GERD on Protonix.  He has continued to take  allopurinol.  He recently was evaluated at [facility] in follow-up of his carotid stenoses which showed less than 50% stenosis on the right and less than 50% on the left stable compared to prior evaluations.   He now presents for cardiology evaluation.   Past Medical History:  Diagnosis Date   Eye abnormalities    GERD (gastroesophageal reflux disease)    Gout 05/30/2023   Hypertension    Kidney stones    Orthostatic hypotension 05/30/2023   Prostate cancer (HCC)    Septic arthritis of knee (HCC) 04/10/2023   Throat cancer [facility])     Past Surgical History:  Procedure Laterality Date   CAROTID STENT     EYE SURGERY     I & D EXTREMITY Left 03/15/2023   Procedure: IRRIGATION AND DEBRIDEMENT LEFT KNEE;  Surgeon: Roby Lofts, MD;  Location: [facility] OR;  Service: Orthopedics;  Laterality: Left;   TONSILLECTOMY     TOTAL HIP ARTHROPLASTY Left 04/19/2023   Procedure: TOTAL HIP ARTHROPLASTY ANTERIOR APPROACH;  Surgeon: Samson Frederic, MD;  Location: [facility] OR;  Service: Orthopedics;  Laterality: Left;    Current Medications: Outpatient Medications Prior to Visit  Medication Sig Dispense Refill   acetaminophen (TYLENOL) 500 MG tablet Take 1,000 mg by mouth every 12 (twelve) hours as needed for mild pain or moderate pain.     allopurinol (ZYLOPRIM) 100 MG tablet Take 1 tablet (100 mg total) by mouth daily. 30 tablet 0   ascorbic acid (VITAMIN C) 500 MG tablet Take 1,000 mg by mouth daily.     atorvastatin (LIPITOR)  40 MG tablet Take 40 mg by mouth at bedtime.     cyanocobalamin 1000 MCG tablet Take 1 tablet (1,000 mcg total) by mouth daily.     docusate sodium (COLACE) 100 MG capsule Take 1 capsule (100 mg total) by mouth 2 (two) times daily. 100 capsule 0   ferrous sulfate 325 (65 FE) MG tablet Take 325 mg by mouth daily with breakfast.     fluticasone (FLONASE) 50 MCG/ACT nasal spray Place 2 sprays into both nostrils daily as needed for allergies.     lactose free nutrition (BOOST) LIQD Take 240 mLs  by mouth 2 (two) times daily.     levothyroxine (SYNTHROID) 112 MCG tablet Take 112 mcg by mouth daily before breakfast.     methocarbamol (ROBAXIN) 500 MG tablet Take 1 tablet (500 mg total) by mouth every 8 (eight) hours as needed for muscle spasms. 20 tablet 0   Multiple Vitamins-Minerals (THERA-M MULTIPLE VITAMINS PO) Take 1 tablet by mouth daily with breakfast.     Omega-3 Fatty Acids (FISH OIL) 1200 MG CAPS Take 1,200 mg by mouth daily.     pantoprazole (PROTONIX) 40 MG tablet Take 40 mg by mouth daily before breakfast.     polyethylene glycol powder (GLYCOLAX/MIRALAX) 17 GM/SCOOP powder Mix as directed and take 1 capful (17 g) by mouth daily as needed for mild constipation. (Patient taking differently: Take 17 g by mouth in the morning.) 238 g 0   senna-docusate (SENOKOT-S) 8.6-50 MG tablet Take 1 tablet by mouth at bedtime as needed for mild constipation. 30 tablet 0   sertraline (ZOLOFT) 50 MG tablet Take 50 mg by mouth daily.     tamsulosin (FLOMAX) 0.4 MG CAPS capsule Take 0.4 mg by mouth daily.     lisinopril (ZESTRIL) 5 MG tablet Take 5 mg by mouth See admin instructions. Take 5 mg by mouth at 8 AM and 8 PM- hold for a Systolic number less than 110 or Diastolic number less than 60 (Patient not taking: Reported on 06/19/2023)     No facility-administered medications prior to visit.     Allergies:   Patient has no known allergies.   Social History   Socioeconomic History   Marital status: Married    Spouse name: Not on file   Number of children: Not on file   Years of education: Not on file   Highest education level: Not on file  Occupational History   Not on file  Tobacco Use   Smoking status: Former    Current packs/day: 20.00    Average packs/day: 13.7 packs/day for 59.9 years (817.9 ttl pk-yrs)    Types: Cigarettes    Start date: 1965    Passive exposure: Past   Smokeless tobacco: Never  Vaping Use   Vaping status: Never Used  Substance and Sexual Activity    Alcohol use: No   Drug use: No   Sexual activity: Not on file  Other Topics Concern   Not on file  Social History Narrative   Not on file   Social Determinants of Health   Financial Resource Strain: Patient Declined (06/08/2023)   Received from [facility]   Overall Financial Resource Strain (CARDIA)    Difficulty of Paying Living Expenses: Patient declined  Food Insecurity: Patient Declined (06/08/2023)   Received from [facility]   Hunger Vital Sign    Worried About Running Out of Food in the Last Year: Patient declined    Ran Out of Food in the Last  Year: Patient declined  Transportation Needs: Patient Declined (06/08/2023)   Received from [facility] - Transportation    Lack of Transportation (Medical): Patient declined    Lack of Transportation (Non-Medical): Patient declined  Physical Activity: Unknown (06/08/2023)   Received from [facility]   Exercise Vital Sign    Days of Exercise per Week: 0 days    Minutes of Exercise per Session: Not on file  Stress: Patient Declined (06/08/2023)   Received from [facility] - Occupational Stress Questionnaire    Feeling of Stress : Patient declined  Social Connections: Socially Integrated (06/08/2023)   Received from [facility]   Social Network    How would you rate your social network (family, work, friends)?: Good participation with social networks    Socially he was born in [place], and lives in [place]. He is in his second marriage for 16 years, with 5 children between them.  Family History:  The patient's family history includes Heart attack in his mother; Heart disease in his brother; Stroke in his mother.  Mother died at 84 and had MI at age 76. Father died at 75 with peritonitis. A brother is deceased at 73 and had heart disease. Sister is 80.   ROS General: Negative; No fevers, chills, or night sweats; approximately 50 pound weight loss since April  2024 HEENT: Negative; No changes in vision or hearing, sinus congestion, difficulty swallowing Pulmonary: Negative; No cough, wheezing, shortness of breath, hemoptysis Cardiovascular: Negative; No chest pain, presyncope, syncope, palpitations GI: Negative; No nausea, vomiting, diarrhea, or abdominal pain GU: Negative; No dysuria, hematuria, or difficulty voiding Musculoskeletal: Left hip replacement surgery April 19, 2023 Hematologic/Oncology: Negative; no easy bruising, bleeding Endocrine: Negative; no heat/cold intolerance; no diabetes Neuro: Negative; no changes in balance, headaches Skin: Negative; No rashes or skin lesions Psychiatric: depression Sleep: Negative; No snoring, daytime sleepiness, hypersomnolence, bruxism, restless legs, hypnogognic hallucinations, no cataplexy Other comprehensive 14 point system review is negative.   PHYSICAL EXAM:   VS:  BP (!) 154/70 (BP Location: Left Arm, Patient Position: Sitting, Cuff Size: Large)   Pulse 76   Ht 6\' 4"  (1.93 m)   Wt 213 lb 12.8 oz (97 kg)   SpO2 95%   BMI 26.02 kg/m     Repeat blood pressure by me today was 160/78 supine, 160/80 sitting, 162/88 standing after 1 minute and 170/90 standing after 3 minutes.  Wt Readings from Last 3 Encounters:  06/19/23 213 lb 12.8 oz (97 kg)  05/08/23 200 lb (90.7 kg)  04/17/23 200 lb (90.7 kg)    General: Alert, oriented, no distress.  Skin: normal turgor, no rashes, warm and dry HEENT: Normocephalic, atraumatic. Pupils equal round and reactive to light; sclera anicteric; extraocular muscles intact;  Nose without nasal septal hypertrophy Mouth/Parynx benign; Mallinpatti scale 3 Neck: No JVD, no carotid bruits; normal carotid upstroke Lungs: clear to ausculatation and percussion; no wheezing or rales Chest wall: without tenderness to palpitation Heart: PMI not displaced, RRR, s1 s2 normal, 1/6 systolic murmur, no diastolic murmur, no rubs, gallops, thrills, or heaves Abdomen: soft,  nontender; no hepatosplenomehaly, BS+; abdominal aorta nontender and not dilated by palpation. Back: no CVA tenderness Pulses 2+ Musculoskeletal: full range of motion, normal strength, no joint deformities Extremities: Mild edema 1+ left lower extremity, trivial right lower extremity, Homan's sign negative  Neurologic: grossly nonfocal; Cranial nerves grossly wnl Psychologic: Normal mood and affect   Studies/Labs Reviewed:  June 19, 2023 ECG (independently read by me): Normal sinus rhythm at 76 bpm.  Early transition  Recent Labs:    Latest Ref Rng & Units 05/08/2023   11:05 AM 04/24/2023    4:53 AM 04/23/2023   10:00 AM  BMP  Glucose 65 - 99 mg/dL 696  295  284   BUN 7 - 25 mg/dL 9  13  10    Creatinine 0.70 - 1.22 mg/dL 1.32  4.40  1.02   BUN/Creat Ratio 6 - 22 (calc) 14     Sodium 135 - 146 mmol/L 137  137  134   Potassium 3.5 - 5.3 mmol/L 4.3  4.1  3.7   Chloride 98 - 110 mmol/L 104  98  100   CO2 20 - 32 mmol/L 24  28  29    Calcium 8.6 - 10.3 mg/dL 8.9  8.5  8.4         Latest Ref Rng & Units 05/08/2023   11:05 AM 03/23/2023    1:23 AM 03/22/2023    4:55 AM  Hepatic Function  Total Protein 6.1 - 8.1 g/dL 5.7  5.3  5.7   Albumin 3.5 - 5.0 g/dL  1.7  1.8   AST 10 - 35 U/L 16  22  21    ALT 9 - 46 U/L 8  8  9    Alk Phosphatase 38 - 126 U/L  92  110   Total Bilirubin 0.2 - 1.2 mg/dL 0.5  0.4  0.7        Latest Ref Rng & Units 05/08/2023   11:05 AM 04/24/2023    4:53 AM 04/23/2023   10:00 AM  CBC  WBC 3.8 - 10.8 Thousand/uL 3.9  4.7  3.9   Hemoglobin 13.2 - 17.1 g/dL 72.5  8.7  9.0   Hematocrit 38.5 - 50.0 % 33.9  27.2  28.3   Platelets 140 - 400 Thousand/uL 223  211  226    Lab Results  Component Value Date   MCV 90.4 05/08/2023   MCV 89.5 04/24/2023   MCV 89.6 04/23/2023   Lab Results  Component Value Date   TSH 3.583 12/01/2022   Lab Results  Component Value Date   HGBA1C 5.9 (H) 03/16/2023     BNP    Component Value Date/Time   BNP 147.5 (H)  12/01/2022 1856    ProBNP No results found for: "PROBNP"   Lipid Panel     Component Value Date/Time   CHOL 88 12/03/2022 0127   TRIG 94 12/03/2022 0127   HDL 26 (L) 12/03/2022 0127   CHOLHDL 3.4 12/03/2022 0127   VLDL 19 12/03/2022 0127   LDLCALC 43 12/03/2022 0127     RADIOLOGY: No results found.   Additional studies/ records that were reviewed today include:  I have reviewed the extensive records of his hospitalizations from May 2024, ED evaluations in June, several hospitalization in August 2024, September 2024 as well as most recently [facility] records from November 11 evaluation with carotid studies.   ASSESSMENT:    1. Orthostatic hypotension   2. Essential hypertension   3. Mixed hyperlipidemia   4. History of gout   5. Stenosis of right carotid artery   6. History of throat cancer   7. S/p left hip fracture     PLAN:  Mr. Treyshawn Gerry is an 82 year old gentleman who has remote history of throat cancer, history of hypertension since age 40, and is status post prior right carotid stenting at [facility]  in 2022 following a radiation treatment of his throat cancer.  Since April 2024 he has had recurrent hospitalizations and has had issues with gout, cellulitis, hematuria, sepsis with methicillin resistance staph aureus and was hospitalized in August with inability to bear weight on his left knee resulting from horizontal tear of the anterior horn and body of the lateral meniscus extending to the free edge.  Following posthospital rehabilitation, he unfortunately had a fall and in September fractured his hip and underwent surgery by Dr. Linna Caprice.  Since April 2024, the patient has lost approximately 40 to 50 pounds.  He has been taken off his amlodipine and lisinopril which were his previous blood pressure medications.  He has had issues with orthostatic hypotension.  He also has experienced some lower extremity edema left greater than right for which he has been wearing support  stockings.  Presently, he denies any chest tightness or pressure.  His blood pressure today is not low and when taken by me was elevated supine at 160/78 and was without orthostatic change sitting or standing.  Presently, he is not dehydrated but may have been dehydrated when at the nursing home following his rehabilitation.  Presently, I have recommended he continue support stockings.  I have recommended reinstitution of low-dose antihypertensive medication with losartan 25 mg to take at bedtime.  If he continues to have issues with dizziness or possible vertigo I have suggested possible future ENT evaluation.  He will continue taking levothyroxine for his hypothyroidism and he has a prescription for Protonix for GERD.  He has been on atorvastatin for hyperlipidemia.  LDL cholesterol in April 2024 was excellent at 43.  I have recommended that he see Azalee Course, PA in 3 months for follow-up Cardiologic evaluation.   Medication Adjustments/Labs and Tests Ordered: Current medicines are reviewed at length with the patient today.  Concerns regarding medicines are outlined above.  Medication changes, Labs and Tests ordered today are listed in the Patient Instructions below. Patient Instructions   medication Instructions:  Begin Losartan 25mg  at bedtime.  Wear 20-26mm support stockings.  If you're still experiencing dizziness purchase the 30-48mm support stockings.  If the support stockings are not helping with the dizziness, we will refer you to ENT (EAR, NOSE AND THROAT)  *If you need a refill on your cardiac medications before your next appointment, please call your pharmacy*   Lab Work: NONE If you have labs (blood work) drawn today and your tests are completely normal, you will receive your results only by: MyChart Message (if you have MyChart) OR A paper copy in the mail If you have any lab test that is abnormal or we need to change your treatment, we will call you to review the  results.   Testing/Procedures: NONE   Follow-Up: At [facility], you and your health needs are our priority.  As part of our continuing mission to provide you with exceptional heart care, we have created designated Provider Care Teams.  These Care Teams include your primary Cardiologist (physician) and Advanced Practice Providers (APPs -  Physician Assistants and Nurse Practitioners) who all work together to provide you with the care you need, when you need it.  We recommend signing up for the patient portal called "MyChart".  Sign up information is provided on this After Visit Summary.  MyChart is used to connect with patients for Virtual Visits (Telemedicine).  Patients are able to view lab/test results, encounter notes, upcoming appointments, etc.  Non-urgent messages can be sent to  your provider as well.   To learn more about what you can do with MyChart, go to [email or web address].    Your next appointment:   3-4 month(s)  Provider:   Azalee Course, PA-C          Signed, Nicki Guadalajara, MD  06/29/2023 1:36 PM    [facility] [address], [address], [place], [place]  [address] Phone: [phone]

## 2023-06-29 ENCOUNTER — Encounter: Payer: Self-pay | Admitting: Cardiovascular Disease

## 2023-07-05 ENCOUNTER — Other Ambulatory Visit: Payer: Self-pay | Admitting: Internal Medicine

## 2023-07-05 DIAGNOSIS — M25462 Effusion, left knee: Secondary | ICD-10-CM

## 2023-07-08 NOTE — Telephone Encounter (Signed)
Last Fill: 03/05/2023  Labs: 05/08/2023 RBC 3.75, Hgb 10.9, Hct 33.9, RDW 17.6, Glucose 137, Creat. 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 When should patient follow up?

## 2023-07-09 NOTE — Telephone Encounter (Signed)
Knee inflammation at last visit was then found to be from infection rather than gout. Should f/u with orthopedics if continued/return in swelling.

## 2023-07-17 ENCOUNTER — Telehealth: Payer: Self-pay | Admitting: *Deleted

## 2023-07-17 NOTE — Telephone Encounter (Signed)
Patient's wife Boneta Lucks called on behalf of the patient. She was requesting a refill on Allopurinol for the patient. Barbaraann Boys, per Dr. Dimple Casey knee inflammation at last visit was then found to be from infection rather than gout. Should f/u with orthopedics if continued/return in swelling. She expressed understanding.

## 2023-08-06 ENCOUNTER — Telehealth: Payer: Self-pay | Admitting: Cardiovascular Disease

## 2023-08-06 MED ORDER — LOSARTAN POTASSIUM 25 MG PO TABS: 25.0000 mg | ORAL_TABLET | Freq: Every day | ORAL | 3 refills | Status: DC

## 2023-08-06 MED ORDER — LOSARTAN POTASSIUM 25 MG PO TABS: 25.0000 mg | ORAL_TABLET | Freq: Two times a day (BID) | ORAL | 3 refills | Status: DC

## 2023-08-06 NOTE — Telephone Encounter (Signed)
 Called and spoke to patient's wife. Verified name and DOB. She is calling because patient is almost out of Losartan  25 mg. She stated she's been giving patient 25 mg twice a day. She report when she saw Dr Burnard on 11/13 he told her to take it twice a day. The pharmacy will not refill medication because it is to early. Spoke to DOD Dr Barbaraann who advised to continue with the twice a day. Notified patient's wife to continue twice a day. New prescription sent to [facility] pharmacy per her request for 90 day supply.  BP readings  11/21 114/93 -HR 73 12/20 159/97- HR 68 12/30  156/80- HR 82 Today  182/77 HR 76

## 2023-08-06 NOTE — Telephone Encounter (Signed)
 Pt c/o medication issue:  1. Name of Medication:   losartan  (COZAAR ) 25 MG tablet    2. How are you currently taking this medication (dosage and times per day)?   Take 1 tablet (25 mg total) by mouth daily.    3. Are you having a reaction (difficulty breathing--STAT)? No  4. What is your medication issue? Pt's wife states that the pt has been taking the medication twice a day and has run out of the medication and needs to know what they need to do from here. Please advise

## 2023-08-06 NOTE — Telephone Encounter (Signed)
Spoke with wife of patient. Patient understands that he is to take the medication only at bedtime as doctor requested. Sent refill to pharmacy.

## 2023-08-09 ENCOUNTER — Other Ambulatory Visit: Payer: Self-pay

## 2023-08-09 ENCOUNTER — Ambulatory Visit (HOSPITAL_COMMUNITY): Payer: Medicare HMO | Attending: Student | Admitting: Physical Therapy

## 2023-08-09 DIAGNOSIS — M79605 Pain in left leg: Secondary | ICD-10-CM | POA: Insufficient documentation

## 2023-08-09 DIAGNOSIS — R600 Localized edema: Secondary | ICD-10-CM | POA: Insufficient documentation

## 2023-08-09 NOTE — Therapy (Signed)
 OUTPATIENT PHYSICAL THERAPY LYMPHEDEMA EVALUATION  Patient Name: Dean Mayer MRN: [id number] DOB:02-15-1941, 83 y.o., male Today's Date: 08/09/2023  END OF SESSION:  PT End of Session - 08/09/23 1625     Visit Number 1    Number of Visits 1    Authorization Type [employer] medicare    PT Start Time 1300    PT Stop Time 1345    PT Time Calculation (min) 45 min    Activity Tolerance Patient tolerated treatment well    Behavior During Therapy [facility] for tasks assessed/performed             Past Medical History:  Diagnosis Date   Eye abnormalities    GERD (gastroesophageal reflux disease)    Gout 05/30/2023   Hypertension    Kidney stones    Orthostatic hypotension 05/30/2023   Prostate cancer (HCC)    Septic arthritis of knee (HCC) 04/10/2023   Throat cancer (HCC)    Past Surgical History:  Procedure Laterality Date   CAROTID STENT     EYE SURGERY     I & D EXTREMITY Left 03/15/2023   Procedure: IRRIGATION AND DEBRIDEMENT LEFT KNEE;  Surgeon: Kendal Franky SQUIBB, MD;  Location: [facility] OR;  Service: Orthopedics;  Laterality: Left;   TONSILLECTOMY     TOTAL HIP ARTHROPLASTY Left 04/19/2023   Procedure: TOTAL HIP ARTHROPLASTY ANTERIOR APPROACH;  Surgeon: Fidel Rogue, MD;  Location: [facility] OR;  Service: Orthopedics;  Laterality: Left;   Patient Active Problem List   Diagnosis Date Noted   Orthostatic hypotension 05/30/2023   Gout 05/30/2023   Protein-calorie malnutrition, severe 04/19/2023   Left displaced femoral neck fracture (HCC) 04/17/2023   Septic arthritis of knee (HCC) 04/10/2023   Painful knee 03/21/2023   MSSA bacteremia 03/18/2023   Acute gout of right ankle 03/18/2023   Bacterial infection of knee joint (HCC) 03/15/2023   Swelling of right foot 02/20/2023   Sedimentation rate elevation 02/20/2023   Positive ANA (antinuclear antibody) 02/20/2023   Peripheral arterial disease (HCC) 12/04/2022   Cellulitis of right lower extremity 12/03/2022   Peripheral edema  12/02/2022   Cellulitis of right leg 12/01/2022   Hypertension 12/01/2022   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   Dyspnea on exertion 12/01/2022   Hypothyroidism 12/01/2022   Carotid stenosis 08/29/2021   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   Malignant neoplasm of prostate (HCC) 05/28/2018   Primary open angle glaucoma (POAG) of right eye, severe stage 09/16/2017   Lacunar infarct, acute (HCC) 01/05/2016   Confusion 01/04/2016   Hypertensive urgency 01/04/2016   Left-sided weakness 01/04/2016   Stroke (HCC) 01/04/2016   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   Basal cell carcinoma of left eyelid 05/09/2015   Carcinoma of left eyelid 04/25/2015   Acne rosacea 07/26/2014   Eyelid lesion 07/26/2014   Primary open angle glaucoma of both eyes 02/23/2013   Visual field defect 02/23/2013   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  He went to rehab and fell and fx his hip and needed a THR in September 13th.  The last of November his thigh started to swell up.  He has been having HH therapy for gait and strenthening but states that they really did not do much with him.  He  was discharged Sunday due to the fact that his MD wanted him to be seen for possible lymphedema.  HE can not ride in a car longer than 30 minutes, lie in bed for three hours .   PERTINENT HISTORY:  per MD:  82yom w/ history of gout, HTN, HLD, BPH, hypothyroidism, throat cancer, positive ANA, positive rheumatoid factor who on 7/29 saw rheumatologist with complaints of left knee pain and swelling. At that point patient's knee was tapped and sent for synovial fluid analysis.  Patient is given intra-articular steroid joint infection.  Fluid analysis shows gout, he was started on allopurinol .  On 7/31 patient recontacted rheumatologist as his pain and swelling persisted.  He was prescribed a 9-day prednisone  taper. Per patient a temporal improvement then his pain worsened.  He was not getting around with a walker.  He had difficulty getting in bed, out of bed, around the house and seen  in ED 03/11/2023>repeat arthrocentesis done AND discharged with hydrocodone , was given orthopedics referral but returned to ED 8/30 tonight  as he developed nausea and vomiting, fever Tmax 100.7 and knee remained painful. 7/29 joint aspiration>75,000 WBCs, cultures grew pansensitive MSSA and was positive for monosodium urate.  one 03/11/2023 synovial fluid>26,000 WBCs rare gram-positive cocci, no crystals noted. ESR 14.1, CRP 97 In the ER,repeat tap of his left knee done. He was afebrile.Left knee remains painful with much decreased range of motion.  Orthopedics on-call Dr. Elsa consulted and  admitted for MSSA left knee septic arthritis 8/9: Incision and drainage of left septic knee Dr Kendal MSSA bacteremia due to infected native left septic knee status post I&D and 6 weeks of cefazolin .   PAIN:  Are you having pain? Yes NPRS scale: 0/10; worst 10/10 happens 1-3 x a day  Pain location: Lt hip  Pain orientation: Left  PAIN TYPE: burning Pain description: intermittent  Aggravating factors: no rhyme or reason  Relieving factors: lift chair with his feet up   PRECAUTIONS: Other: cellulitis    WEIGHT BEARING RESTRICTIONS: No  FALLS:  Has patient fallen in last 6 months? No  LIVING ENVIRONMENT: Lives with: lives with their  spouse Lives in: House/apartment Stairs: Yes: External: 4 steps; on right going up Has following equipment at home: Walker - 2 wheeled  OCCUPATION: retired     PATIENT GOALS: To get rid of the swelling / walk without the walker     OBJECTIVE: Note: Objective measures were completed at Evaluation unless otherwise noted.  COGNITION: Overall cognitive status: Within functional limits for tasks assessed   PALPATION: No induration noted pt does have swelling   OBSERVATIONS / OTHER ASSESSMENTS: no fibrotic changes, no papilloma formation no hyperkeratosis  POSTURE: forward bent ambulating with RW    LYMPHEDEMA ASSESSMENTS:   SURGERY TYPE/DATE: see above   NUMBER OF LYMPH NODES REMOVED: none   LE LANDMARK RIGHT eval  At groin Measured sitting   30 cm proximal to suprapatella   20 cm proximal to suprapatella 59.5  10 cm proximal to suprapatella 49.3  At midpatella / popliteal crease 46  30 cm proximal  to floor at lateral plantar foot 35   20 cm proximal to floor at lateral plantar foot 29.7  10 cm proximal to floor at lateral plantar foot 27  Circumference of ankle/heel 36.5  5 cm proximal to 1st MTP joint 24.3  Across MTP joint 24.5  Around proximal great toe   (Blank rows = not tested)  LE LANDMARK LEFT eval  At groin   30 cm proximal to suprapatella   20 cm proximal to suprapatella 59  10 cm proximal to suprapatella 52  At midpatella / popliteal crease 48.5  30 cm proximal to floor at lateral plantar foot 37.8  20 cm proximal to floor at lateral plantar foot 31.3  10 cm proximal to floor at lateral plantar foot 27  Circumference of ankle/heel 37.7  5 cm proximal to 1st MTP joint 25.8  Across MTP joint 25.6  Around proximal great toe   (Blank rows = not tested)  FUNCTIONAL TESTS:   2 minutes walk test 178 ft with rolling walker      TODAY'S TREATMENT:                                                                                                                               DATE: 07/09/23   PATIENT EDUCATION:  Education details: although pt has edema in his Lt LE it is most likely due to multiple trauma and inactivity.  The pt will most likely do better at an out-patient rehab rather than home health, however, the pt lives in [place], 40 miles away and can find an OP clinic in the town that they live.  PT given HEP   Person educated: Patient and Spouse Education method: Explanation, Verbal cues, and Handouts Education comprehension: verbalized understanding  HOME EXERCISE PROGRAM: LAQ, marching, hip ab/adduction, diaphragm breathing   ASSESSMENT:  CLINICAL IMPRESSION: Patient is a 83 y.o. male  who was seen today for physical therapy evaluation and treatment for swelling of his LT LE.  The pt does have edema in his LT LE but does not have any indication of having lymphedema.  In all probability the swelling in his Lt LE is most likely due to multiple trauma of gout, I&D, falling with fx and THR in a short period of time.  The pt has become very immobile only getting up to go to the restroom which I explained also contributes to the swelling.  Dean Mayer is in  need of physical therapy but not in need of lymphedema treatment.  Manual should be part of the treatment to assist in decreasing the edema.  The couple requests to have treatment closer to home.   OBJECTIVE IMPAIRMENTS: Abnormal gait, decreased activity tolerance, decreased balance, decreased mobility, difficulty walking, decreased ROM, decreased strength, increased edema, increased fascial restrictions, impaired perceived functional ability, impaired flexibility, and pain.   ACTIVITY LIMITATIONS: carrying, lifting, sitting, standing, squatting, stairs, bed mobility, dressing, hygiene/grooming, and locomotion level  PARTICIPATION  LIMITATIONS: cleaning, driving, shopping, community activity, and yard work  PERSONAL FACTORS: Age and Past/current experiences are also affecting patient's  functional outcome.   REHAB POTENTIAL: Good  CLINICAL DECISION MAKING: Stable/uncomplicated  EVALUATION COMPLEXITY: Moderate  GOALS: Goals reviewed with patient? No  SHORT TERM GOALS: Target date: 08/11/23  PT to be I in HEp to improve strength and mobility  Baseline: Goal status: INITIAL     PLAN:  PT FREQUENCY: 1x/week  PT DURATION: 1 week  PLANNED INTERVENTIONS: 97110-Therapeutic exercises and 02464- Self Care  PLAN FOR NEXT SESSION: PT to get treatment closer to home.   Dean Mayer, PT CLT [phone]  08/09/2023, 4:27 PM

## 2023-08-12 ENCOUNTER — Encounter (HOSPITAL_COMMUNITY): Payer: Medicare HMO | Admitting: Physical Therapy

## 2023-08-12 DIAGNOSIS — M7989 Other specified soft tissue disorders: Secondary | ICD-10-CM

## 2023-08-14 ENCOUNTER — Encounter (HOSPITAL_COMMUNITY): Payer: Medicare HMO | Admitting: Physical Therapy

## 2023-08-16 ENCOUNTER — Encounter (HOSPITAL_COMMUNITY): Payer: Medicare HMO

## 2023-08-19 ENCOUNTER — Encounter (HOSPITAL_COMMUNITY): Payer: Medicare HMO | Admitting: Physical Therapy

## 2023-08-21 ENCOUNTER — Encounter (HOSPITAL_COMMUNITY): Payer: Medicare HMO | Admitting: Physical Therapy

## 2023-08-23 ENCOUNTER — Encounter (HOSPITAL_COMMUNITY): Payer: Medicare HMO

## 2023-08-28 ENCOUNTER — Encounter (HOSPITAL_COMMUNITY): Payer: Medicare HMO

## 2023-08-30 ENCOUNTER — Encounter (HOSPITAL_COMMUNITY): Payer: Medicare HMO | Admitting: Physical Therapy

## 2023-09-02 ENCOUNTER — Encounter (HOSPITAL_COMMUNITY): Payer: Medicare HMO | Admitting: Physical Therapy

## 2023-09-04 ENCOUNTER — Encounter (HOSPITAL_COMMUNITY): Payer: Medicare HMO | Admitting: Physical Therapy

## 2023-09-06 ENCOUNTER — Encounter (HOSPITAL_COMMUNITY): Payer: Medicare HMO | Admitting: Physical Therapy

## 2023-09-09 ENCOUNTER — Encounter (HOSPITAL_COMMUNITY): Payer: Medicare HMO | Admitting: Physical Therapy

## 2023-09-11 ENCOUNTER — Encounter (HOSPITAL_COMMUNITY): Payer: Medicare HMO | Admitting: Physical Therapy

## 2023-09-13 ENCOUNTER — Encounter (HOSPITAL_COMMUNITY): Payer: Medicare HMO | Admitting: Physical Therapy

## 2023-09-16 ENCOUNTER — Encounter (HOSPITAL_COMMUNITY): Payer: Medicare HMO | Admitting: Physical Therapy

## 2023-09-18 ENCOUNTER — Encounter (HOSPITAL_COMMUNITY): Payer: Medicare HMO | Admitting: Physical Therapy

## 2023-09-18 ENCOUNTER — Ambulatory Visit: Payer: Medicare HMO | Admitting: Cardiovascular Disease

## 2023-09-20 ENCOUNTER — Encounter (HOSPITAL_COMMUNITY): Payer: Medicare HMO | Admitting: Physical Therapy

## 2023-09-30 NOTE — Progress Notes (Unsigned)
 Cardiology Clinic Note   Patient Name: Dean Mayer Date of Encounter: 10/02/2023  Primary Care Provider:  Angelique Blonder, MD Primary Cardiologist:  Nicki Guadalajara, MD  Patient Profile     Dean Mayer 83 year old male presents to the clinic today for follow-up evaluation of his orthostatic hypotension.  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   Prostate cancer (HCC)    Septic arthritis of knee (HCC) 04/10/2023   Throat cancer [facility])    Past Surgical History:  Procedure Laterality Date   CAROTID STENT     EYE SURGERY     I & D EXTREMITY Left 03/15/2023   Procedure: IRRIGATION AND DEBRIDEMENT LEFT KNEE;  Surgeon: Roby Lofts, MD;  Location: [facility] OR;  Service: Orthopedics;  Laterality: Left;   TONSILLECTOMY     TOTAL HIP ARTHROPLASTY Left 04/19/2023   Procedure: TOTAL HIP ARTHROPLASTY ANTERIOR APPROACH;  Surgeon: Samson Frederic, MD;  Location: [facility] OR;  Service: Orthopedics;  Laterality: Left;    Allergies  No Known Allergies  History of Present Illness     Dean Mayer has a PMH of GERD, HTN, gout, kidney stones, orthostatic hypotension, and throat cancer.  He was seen by Dr. Tresa Endo 06/19/2023.  He reported a history of hypertension since age 49.  He also reported hypothyroidism and a history of throat cancer in 2013.  He is status post radiation therapy and subsequent development of right carotid artery stenosis with stenting at [facility] following radiation treatment in 2022.  He was noted to have lower extremity edema and reported previously wearing support stockings.  He had been hospitalized in May with hematuria.  7/24 he developed gout in his left knee and subsequently was hospitalized with MSSA bacteremia and left knee septic arthritis.  After hospitalization he went to [facility]  rehab.  He developed dizziness in September and presented to the hospital after falling September 11.  This fall resulted in a left hip fracture.  He lost approximately 40-50 pounds.  He had been having issues with multiple dizzy spells several weeks prior to his follow-up.  He underwent surgery for his hip fracture and received total hip arthroplasty 04/19/2023.  He was noted to have recurrent orthostatic hypotension.  He was evaluated by neurology at [facility].  He was not noted to have signs of Parkinson's.  He had been taken off of his blood pressure medication including amlodipine and lisinopril.  He noted he had he had been drinking nutritional supplements such as boost.  On follow-up his blood pressure was noted to be 154/70.  On recheck it was 160/78.  He was not noted to have orthostatic changes.  It was felt that he may have been dehydrated at the nursing facility.  It was recommended that he continues his lower extremity support stockings.  His losartan 25 mg at bedtime was resumed.  He presents to the clinic today for follow-up evaluation and states he has been doing physical therapy 2-3 times per week.  He presents with his wife today.  He is ambulating with a walker.  He continues to have trouble with his knee.  His wife reports that he was having headaches at night and that has a blood pressure was elevated.  He started taking losartan 25 mg twice daily  and blood pressure was much better controlled.  Headaches resolved.  Wife also notes that he has not been having restful sleep.  He is sleeping some through the day.  I will continue his losartan twice daily, give sleep hygiene instructions, have him continue his physical therapy, give the salty 6 diet sheet and plan follow-up in 6 months.  Today he denies chest pain, shortness of breath, fatigue, palpitations, melena, hematuria, hemoptysis, diaphoresis, weakness, presyncope, syncope, orthopnea, and PND.    Home Medications    Prior to Admission  medications   Medication Sig Start Date End Date Taking? Authorizing Provider  acetaminophen (TYLENOL) 500 MG tablet Take 1,000 mg by mouth every 12 (twelve) hours as needed for mild pain or moderate pain.    [provider]  allopurinol (ZYLOPRIM) 100 MG tablet Take 1 tablet (100 mg total) by mouth daily. 03/05/23   Rice, Jamesetta Orleans, MD  ascorbic acid (VITAMIN C) 500 MG tablet Take 1,000 mg by mouth daily.    [provider]  atorvastatin (LIPITOR) 40 MG tablet Take 40 mg by mouth at bedtime.    [provider]  cyanocobalamin 1000 MCG tablet Take 1 tablet (1,000 mcg total) by mouth daily. 04/25/23   Kathlen Mody, MD  docusate sodium (COLACE) 100 MG capsule Take 1 capsule (100 mg total) by mouth 2 (two) times daily. 03/20/23   Lanae Boast, MD  ferrous sulfate 325 (65 FE) MG tablet Take 325 mg by mouth daily with breakfast.    [provider]  fluticasone (FLONASE) 50 MCG/ACT nasal spray Place 2 sprays into both nostrils daily as needed for allergies.    [provider]  lactose free nutrition (BOOST) LIQD Take 240 mLs by mouth 2 (two) times daily.    [provider]  levothyroxine (SYNTHROID) 112 MCG tablet Take 112 mcg by mouth daily before breakfast.    [provider]  lisinopril (ZESTRIL) 5 MG tablet Take 5 mg by mouth See admin instructions. Take 5 mg by mouth at 8 AM and 8 PM- hold for a Systolic number less than 110 or Diastolic number less than 60 Patient not taking: Reported on 06/19/2023    [provider]  losartan (COZAAR) 25 MG tablet Take 1 tablet (25 mg total) by mouth daily. This medication is to be taken at bedtime 08/06/23 11/04/23  Lennette Bihari, MD  methocarbamol (ROBAXIN) 500 MG tablet Take 1 tablet (500 mg total) by mouth every 8 (eight) hours as needed for muscle spasms. 04/24/23   Kathlen Mody, MD  Multiple Vitamins-Minerals (THERA-M MULTIPLE VITAMINS PO) Take 1 tablet by mouth daily with breakfast.     [provider]  Omega-3 Fatty Acids (FISH OIL) 1200 MG CAPS Take 1,200 mg by mouth daily.    [provider]  pantoprazole (PROTONIX) 40 MG tablet Take 40 mg by mouth daily before breakfast.    [provider]  polyethylene glycol powder (GLYCOLAX/MIRALAX) 17 GM/SCOOP powder Mix as directed and take 1 capful (17 g) by mouth daily as needed for mild constipation. Patient taking differently: Take 17 g by mouth in the morning. 03/20/23   Lanae Boast, MD  senna-docusate (SENOKOT-S) 8.6-50 MG tablet Take 1 tablet by mouth at bedtime as needed for mild constipation. 03/20/23   Lanae Boast, MD  sertraline (ZOLOFT) 50 MG tablet Take 50 mg by mouth daily.    [provider]  tamsulosin (FLOMAX) 0.4 MG CAPS capsule Take 0.4 mg by mouth daily.  [provider]    Family History    Family History  Problem Relation Age of Onset   Stroke Mother    Heart attack Mother    Heart disease Brother    He indicated that his mother is deceased. He indicated that his father is deceased. He indicated that his sister is alive. He indicated that both of his brothers are deceased.  Social History    Social History   Socioeconomic History   Marital status: Married    Spouse name: Not on file   Number of children: Not on file   Years of education: Not on file   Highest education level: Not on file  Occupational History   Not on file  Tobacco Use   Smoking status: Former    Current packs/day: 20.00    Average packs/day: 13.7 packs/day for 60.2 years (823.1 ttl pk-yrs)    Types: Cigarettes    Start date: 1965    Passive exposure: Past   Smokeless tobacco: Never  Vaping Use   Vaping status: Never Used  Substance and Sexual Activity   Alcohol use: No   Drug use: No   Sexual activity: Not on file  Other Topics Concern   Not on file  Social History Narrative   Not on file   Social Drivers of Health   Financial Resource Strain: Patient Declined (06/08/2023)    Received from [facility]   Overall Financial Resource Strain (CARDIA)    Difficulty of Paying Living Expenses: Patient declined  Food Insecurity: Patient Declined (06/08/2023)   Received from [facility]   Hunger Vital Sign    Worried About Running Out of Food in the Last Year: Patient declined    Ran Out of Food in the Last Year: Patient declined  Transportation Needs: Patient Declined (06/08/2023)   Received from [facility] - Transportation    Lack of Transportation (Medical): Patient declined    Lack of Transportation (Non-Medical): Patient declined  Physical Activity: Unknown (06/08/2023)   Received from [facility]   Exercise Vital Sign    Days of Exercise per Week: 0 days    Minutes of Exercise per Session: Not on file  Stress: Patient Declined (06/08/2023)   Received from [facility] - Occupational Stress Questionnaire    Feeling of Stress : Patient declined  Social Connections: Socially Integrated (06/08/2023)   Received from [facility]   Social Network    How would you rate your social network (family, work, friends)?: Good participation with social networks  Intimate Partner Violence: Not At Risk (07/19/2023)   Received from [facility]   HITS    Over the last 12 months how often did your partner scream or curse at you?: Never    Over the last 12 months how often did your partner threaten you with physical harm?: Never    Over the last 12 months how often did your partner insult you or talk down to you?: Never    Over the last 12 months how often did your partner physically hurt you?: Never     Review of Systems    General:  No chills, fever, night sweats or weight changes.  Cardiovascular:  No chest pain, dyspnea on exertion, edema, orthopnea, palpitations, paroxysmal nocturnal dyspnea. Dermatological: No rash, lesions/masses Respiratory: No cough, dyspnea Urologic: No hematuria,  dysuria Abdominal:   No nausea, vomiting, diarrhea, bright red blood per rectum, melena, or hematemesis Neurologic:  No visual changes, wkns, changes in mental status. All other systems reviewed and are otherwise negative except as noted above.  Physical Exam    VS:  BP 138/64 (BP Location: Left Arm, Patient Position: Sitting, Cuff Size: Normal)   Pulse 66   Ht 6\' 1"  (1.854 m)   Wt 234 lb (106.1 kg)   SpO2 98%   BMI 30.87 kg/m  , BMI Body mass index is 30.87 kg/m. GEN: Well nourished, well developed, in no acute distress. HEENT: normal. Neck: Supple, no JVD, carotid bruits, or masses. Cardiac: RRR, no murmurs, rubs, or gallops. No clubbing, cyanosis, generalized bilateral lower extremity nonpitting edema.  Radials/DP/PT 2+ and equal bilaterally.  Respiratory:  Respirations regular and unlabored, clear to auscultation bilaterally. GI: Soft, nontender, nondistended, BS + x 4. MS: no deformity or atrophy. Skin: warm and dry, no rash. Neuro:  Strength and sensation are intact. Psych: Normal affect.  Accessory Clinical Findings    Recent Labs: 12/01/2022: B Natriuretic Peptide 147.5; TSH 3.583 05/08/2023: ALT 8; BUN 9; Creat 0.63; Hemoglobin 10.9; Platelets 223; Potassium 4.3; Sodium 137   Recent Lipid Panel    Component Value Date/Time   CHOL 88 12/03/2022 0127   TRIG 94 12/03/2022 0127   HDL 26 (L) 12/03/2022 0127   CHOLHDL 3.4 12/03/2022 0127   VLDL 19 12/03/2022 0127   LDLCALC 43 12/03/2022 0127         ECG personally reviewed by me today-none today.     Echocardiogram 04/20/2023  IMPRESSIONS     1. Very limited study. Grossly normal left and right ventricular  function. There are mild degenerative changes and calcification of the  aortic leaflets and mitral annulus. No major valvular abnormalities are  seen.   Conclusion(s)/Recommendation(s): No evidence of valvular vegetations on  this transthoracic echocardiogram. Consider a transesophageal  echocardiogram  to exclude infective endocarditis if clinically indicated.   FINDINGS   Additional Comments: Color Doppler performed.    Thurmon Fair MD  Electronically signed by Thurmon Fair MD  Signature Date/Time: 04/20/2023/12:14:48 PM        Final      Assessment & Plan   1.  Hypertension, orthostatic hypotension-BP today 138/64.  Previously noted to have orthostatic hypotension in the setting of dehydration while at rehab.  Denies further episodes of lightheadedness, presyncope or syncope.  Was having headaches with high blood pressure at night and started taking losartan twice daily.  Headaches resolved. Change  losartan 25 mg to BID Heart healthy low-sodium diet Maintain hydration  Hyperlipidemia-LDL 43 on 12/03/22. Continue atorvastatin High-fiber diet Increase physical activity as tolerated Plan for repeat fasting lipids and LFTs 5/25  Hypothyroidism-reports compliance with levothyroxine. Follows with PCP  Throat cancer, carotid stenosis-was noted to have carotid stenosis post radiation treatment for throat cancer.  Carotid Dopplers were reassuring. Followed at [facility].  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  Waking up at night.  Having dreams where people are in the house. Sleep hygiene instructions provided Follow-up with PCP if symptoms do not improve  Disposition: Follow-up with Dr. Tresa Endo or me in 6 months.   Thomasene Ripple. Nissim Fleischer NP-C     10/02/2023, 10:30 AM [facility] [address] Office [phone] Fax [phone]    I spent 14 minutes examining this patient, reviewing medications, and using patient centered shared decision making involving their cardiac care.   I spent  20 minutes reviewing past medical history,  medications, and prior cardiac tests.

## 2023-10-02 ENCOUNTER — Encounter: Payer: Self-pay | Admitting: General Practice

## 2023-10-02 ENCOUNTER — Ambulatory Visit: Payer: Medicare HMO | Attending: General Practice | Admitting: General Practice

## 2023-10-02 VITALS — BP 138/64 | HR 66 | Ht 73.0 in | Wt 234.0 lb

## 2023-10-02 DIAGNOSIS — I951 Orthostatic hypotension: Secondary | ICD-10-CM

## 2023-10-02 DIAGNOSIS — I10 Essential (primary) hypertension: Secondary | ICD-10-CM | POA: Diagnosis not present

## 2023-10-02 DIAGNOSIS — G479 Sleep disorder, unspecified: Secondary | ICD-10-CM

## 2023-10-02 DIAGNOSIS — E038 Other specified hypothyroidism: Secondary | ICD-10-CM

## 2023-10-02 DIAGNOSIS — E782 Mixed hyperlipidemia: Secondary | ICD-10-CM | POA: Diagnosis not present

## 2023-10-02 DIAGNOSIS — Z85819 Personal history of malignant neoplasm of unspecified site of lip, oral cavity, and pharynx: Secondary | ICD-10-CM | POA: Diagnosis not present

## 2023-10-02 MED ORDER — LOSARTAN POTASSIUM 25 MG PO TABS: 25.0000 mg | ORAL_TABLET | Freq: Two times a day (BID) | ORAL | 3 refills | Status: AC

## 2023-10-02 NOTE — Patient Instructions (Addendum)
 Medication Instructions:  START LOSARTAN 25MG  TWICE DAILY *If you need a refill on your cardiac medications before your next appointment, please call your pharmacy*  Lab Work: NONE  Other Instructions INCREASE PHYSICAL ACTIVITY AS TOLERATED PLEASE READ AND FOLLOW ATTACHED  SALTY 6 SEE ATTACHED SLEEP TIPS ELEVATE LOWER EXTREMITIES WHEN SITTING OR NOT ACTIVE CONTINUE TO WEAR YOUR COMPRESSION STOCKINGS     Please try to avoid these triggers: Do not use any products that have nicotine or tobacco in them. These include cigarettes, e-cigarettes, and chewing tobacco. If you need help quitting, ask your doctor. Eat heart-healthy foods. Talk with your doctor about the right eating plan for you. Exercise regularly as told by your doctor. Stay hydrated Do not drink alcohol, Caffeine or chocolate. Lose weight if you are overweight. Do not use drugs, including cannabis  Follow-Up: At [facility], you and your health needs are our priority.  As part of our continuing mission to provide you with exceptional heart care, we have created designated Provider Care Teams.  These Care Teams include your primary Cardiologist (physician) and Advanced Practice Providers (APPs -  Physician Assistants and Nurse Practitioners) who all work together to provide you with the care you need, when you need it.  Your next appointment:   6 month(s)  Provider:   Nicki Guadalajara, MD  or Edd Fabian, FNP        Quality Sleep Information, Adult (SLEEP TIPS) Quality sleep is important for your mental and physical health. It also improves your quality of life. Quality sleep means you: Are asleep for most of the time you are in bed. Fall asleep within 30 minutes. Wake up no more than once a night. Are awake for no longer than 20 minutes if you do wake up during the night. Most adults need 7-8 hours of quality sleep each night. How can poor sleep affect me? If you do not get enough quality sleep, you may  have: Mood swings. Daytime sleepiness. Decreased alertness, reaction time, and concentration. Sleep disorders, such as insomnia and sleep apnea. Difficulty with: Solving problems. Coping with stress. Paying attention. These issues may affect your performance and productivity at work, school, and home. Lack of sleep may also put you at higher risk for accidents, suicide, and risky behaviors. If you do not get quality sleep, you may also be at higher risk for several health problems, including: Infections. Type 2 diabetes. Heart disease. High blood pressure. Obesity. Worsening of long-term conditions, like arthritis, kidney disease, depression, Parkinson's disease, and epilepsy. What actions can I take to get more quality sleep? Sleep schedule and routine Stick to a sleep schedule. Go to sleep and wake up at about the same time each day. Do not try to sleep less on weekdays and make up for lost sleep on weekends. This does not work. Limit naps during the day to 30 minutes or less. Do not take naps in the late afternoon. Make time to relax before bed. Reading, listening to music, or taking a hot bath promotes quality sleep. Make your bedroom a place that promotes quality sleep. Keep your bedroom dark, quiet, and at a comfortable room temperature. Make sure your bed is comfortable. Avoid using electronic devices that give off bright blue light for 30 minutes before bedtime. Your brain perceives bright blue light as sunlight. This includes television, phones, and computers. If you are lying awake in bed for longer than 20 minutes, get up and do a relaxing activity until you feel sleepy.  Lifestyle     Try to get at least 30 minutes of exercise on most days. Do not exercise 2-3 hours before going to bed. Do not use any products that contain nicotine or tobacco. These products include cigarettes, chewing tobacco, and vaping devices, such as e-cigarettes. If you need help quitting, ask your  health care provider. Do not drink caffeinated beverages for at least 8 hours before going to bed. Coffee, tea, and some sodas contain caffeine. Do not drink alcohol or eat large meals close to bedtime. Try to get at least 30 minutes of sunlight every day. Morning sunlight is best. Medical concerns Work with your health care provider to treat medical conditions that may affect sleeping, such as: Nasal obstruction. Snoring. Sleep apnea and other sleep disorders. Talk to your health care provider if you think any of your prescription medicines may cause you to have difficulty falling or staying asleep. If you have sleep problems, talk with a sleep consultant. If you think you have a sleep disorder, talk with your health care provider about getting evaluated by a specialist. Where to find more information Sleep Foundation: sleepfoundation.org American Academy of Sleep Medicine: aasm.org Centers for Disease Control and Prevention (CDC): TonerPromos.no Contact a health care provider if: You have trouble getting to sleep or staying asleep. You often wake up very early in the morning and cannot get back to sleep. You have daytime sleepiness. You have daytime sleep attacks of suddenly falling asleep and sudden muscle weakness (narcolepsy). You have a tingling sensation in your legs with a strong urge to move your legs (restless legs syndrome). You stop breathing briefly during sleep (sleep apnea). You think you have a sleep disorder or are taking a medicine that is affecting your quality of sleep. Summary Most adults need 7-8 hours of quality sleep each night. Getting enough quality sleep is important for your mental and physical health. Make your bedroom a place that promotes quality sleep, and avoid things that may cause you to have poor sleep, such as alcohol, caffeine, smoking, or large meals. Talk to your health care provider if you have trouble falling asleep or staying asleep. This information is  not intended to replace advice given to you by your health care provider. Make sure you discuss any questions you have with your health care provider. Document Revised: 11/15/2021 Document Reviewed: 11/15/2021 Elsevier Patient Education  2024 [employer].

## 2023-10-09 ENCOUNTER — Ambulatory Visit: Payer: Medicare HMO | Admitting: Student

## 2023-10-09 ENCOUNTER — Ambulatory Visit: Payer: Medicare HMO | Admitting: Cardiology

## 2023-10-31 DIAGNOSIS — M7989 Other specified soft tissue disorders: Secondary | ICD-10-CM

## 2023-12-12 ENCOUNTER — Other Ambulatory Visit: Payer: Self-pay

## 2023-12-12 DIAGNOSIS — I739 Peripheral vascular disease, unspecified: Secondary | ICD-10-CM

## 2024-01-07 ENCOUNTER — Other Ambulatory Visit: Payer: Self-pay | Admitting: *Deleted

## 2024-01-07 DIAGNOSIS — I739 Peripheral vascular disease, unspecified: Secondary | ICD-10-CM

## 2024-01-09 ENCOUNTER — Telehealth: Payer: Self-pay

## 2024-01-09 ENCOUNTER — Ambulatory Visit: Admitting: Orthopedic Surgery

## 2024-01-09 ENCOUNTER — Other Ambulatory Visit (INDEPENDENT_AMBULATORY_CARE_PROVIDER_SITE_OTHER): Payer: Self-pay

## 2024-01-09 DIAGNOSIS — G8929 Other chronic pain: Secondary | ICD-10-CM | POA: Diagnosis not present

## 2024-01-09 DIAGNOSIS — M25562 Pain in left knee: Secondary | ICD-10-CM

## 2024-01-09 DIAGNOSIS — M1712 Unilateral primary osteoarthritis, left knee: Secondary | ICD-10-CM | POA: Diagnosis not present

## 2024-01-09 NOTE — Telephone Encounter (Signed)
 Left knee gel injection ?

## 2024-01-09 NOTE — Telephone Encounter (Signed)
 Duplicate message.

## 2024-01-10 ENCOUNTER — Encounter: Payer: Self-pay | Admitting: Orthopedic Surgery

## 2024-01-10 MED ORDER — LIDOCAINE HCL 1 % IJ SOLN
5.0000 mL | INTRAMUSCULAR | Status: AC | PRN
  Administered 2024-01-09: 5 mL

## 2024-01-10 MED ORDER — BUPIVACAINE HCL 0.25 % IJ SOLN
4.0000 mL | INTRAMUSCULAR | Status: AC | PRN
  Administered 2024-01-09: 4 mL via INTRA_ARTICULAR

## 2024-01-10 NOTE — Telephone Encounter (Signed)
VOB submitted for durolane, left knee

## 2024-01-10 NOTE — Progress Notes (Signed)
 Office Visit Note   Patient: Dean Mayer           Date of Birth: November 28, 1940           MRN: [id number] Visit Date: 01/09/2024 Requested by: Trenton Frock, MD [address] [place],  [place] [address] PCP: Amon Bali Collin Deal, MD  Subjective: Chief Complaint  Patient presents with   Left Knee - Pain    HPI: Dean Mayer is a 83 y.o. male who presents to the office reporting left knee pain.  He states his baseline amount of knee pain is worsened with weightbearing.  Does have a history of gout.  Did have sepsis in that knee last year.  There is almost reviewed.  He also broke his hip but underwent fracture fixation and hip replacement for that.  Currently no fevers or chills..                ROS: All systems reviewed are negative as they relate to the chief complaint within the history of present illness.  Patient denies fevers or chills.  Assessment & Plan: Visit Diagnoses:  1. 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  Aspiration and injection performed today with Toradol .  Will preapproved gel.  Follow-up in 4 to 6 weeks for gel injection.  Follow-Up Instructions: No follow-ups on file.   Orders:  Orders Placed This Encounter  Procedures   XR KNEE 3 VIEW LEFT   No orders of the defined types were placed in this encounter.     Procedures: Large Joint Inj: L knee on 01/09/2024 8:07 AM Indications: diagnostic evaluation, joint swelling and pain Details: 18 G 1.5 in needle, superolateral approach  Arthrogram: No  Medications: 5 mL lidocaine  1 %; 4 mL bupivacaine  0.25 % Outcome: tolerated well, no immediate complications Procedure, treatment alternatives, risks and benefits explained, specific risks discussed. Consent was given by the patient. Immediately prior to procedure a time out was called to verify the correct patient, procedure, equipment, support staff and site/side marked as required. Patient was prepped  and draped in the usual sterile fashion.    Toradol  injected   Clinical Data: No additional findings.  Objective: Vital Signs: There were no vitals taken for this visit.  Physical Exam:  Constitutional: Patient appears well-developed HEENT:  Head: Normocephalic Eyes:EOM are normal Neck: Normal range of motion Cardiovascular: Normal rate Pulmonary/chest: Effort normal Neurologic: Patient is alert Skin: Skin is warm Psychiatric: Patient has normal mood and affect  Ortho Exam: Ortho exam demonstrates slight valgus alignment.  Lacks about 5 degrees of full extension.  Is able to get to 90 degrees in past with flexion.  Extensor mechanism is intact.  Lateral greater than medial joint line tenderness.  Specialty Comments:  No specialty comments available.  Imaging: No results found.   PMFS History: Patient Active Problem List   Diagnosis Date Noted   Orthostatic hypotension 05/30/2023   Gout 05/30/2023   Protein-calorie malnutrition, severe 04/19/2023   Left displaced femoral neck fracture (HCC) 04/17/2023   Septic arthritis of knee (HCC) 04/10/2023   Painful knee 03/21/2023   MSSA bacteremia 03/18/2023   Acute gout of right ankle 03/18/2023   Bacterial infection of knee joint (HCC) 03/15/2023   Swelling of right foot 02/20/2023   Sedimentation rate elevation 02/20/2023   Positive ANA (antinuclear antibody) 02/20/2023   Peripheral arterial disease (HCC) 12/04/2022   Cellulitis of right lower extremity 12/03/2022  Peripheral edema 12/02/2022   Cellulitis of right leg 12/01/2022   Hypertension 12/01/2022   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   Dyspnea on exertion 12/01/2022   Hypothyroidism 12/01/2022   Carotid stenosis 08/29/2021   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   Malignant neoplasm of prostate (HCC) 05/28/2018   Primary open angle glaucoma (POAG) of right eye, severe stage 09/16/2017   Lacunar infarct, acute (HCC) 01/05/2016   Confusion  01/04/2016   Hypertensive urgency 01/04/2016   Left-sided weakness 01/04/2016   Stroke (HCC) 01/04/2016   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   Basal cell carcinoma of left eyelid 05/09/2015   Carcinoma of left eyelid 04/25/2015   Acne rosacea 07/26/2014   Eyelid lesion 07/26/2014   Primary open angle glaucoma of both eyes 02/23/2013   Visual field defect 02/23/2013   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   Past Medical History:  Diagnosis Date   Eye abnormalities    GERD (gastroesophageal reflux disease)    Gout 05/30/2023   Hypertension    Kidney stones    Orthostatic hypotension 05/30/2023   Prostate cancer (HCC)    Septic arthritis of knee (HCC) 04/10/2023   Throat cancer (HCC)     Family History  Problem Relation Age of Onset   Stroke Mother    Heart attack Mother    Heart disease Brother     Past Surgical History:  Procedure Laterality Date   CAROTID STENT     EYE SURGERY     I & D EXTREMITY Left 03/15/2023   Procedure: IRRIGATION AND DEBRIDEMENT LEFT KNEE;  Surgeon: Laneta Pintos, MD;  Location: [facility] OR;  Service: Orthopedics;  Laterality: Left;   TONSILLECTOMY     TOTAL HIP ARTHROPLASTY Left 04/19/2023   Procedure: TOTAL HIP ARTHROPLASTY ANTERIOR APPROACH;  Surgeon: Adonica Hoose, MD;  Location: [facility] OR;  Service: Orthopedics;  Laterality: Left;   Social History   Occupational History   Not on file  Tobacco Use   Smoking status: Former    Current packs/day: 20.00    Average packs/day: 13.7 packs/day for 60.4 years (828.5 ttl pk-yrs)    Types: Cigarettes    Start date: 1965    Passive exposure: Past   Smokeless tobacco: Never  Vaping Use   Vaping status: Never Used  Substance and Sexual Activity   Alcohol use: No   Drug use: No   Sexual activity: Not on file

## 2024-01-20 ENCOUNTER — Ambulatory Visit
Admission: RE | Admit: 2024-01-20 | Discharge: 2024-01-20 | Disposition: A | Discharge status: Home / Self Care | Source: Ambulatory Visit | Attending: Surgery | Admitting: Surgery

## 2024-01-20 ENCOUNTER — Encounter: Payer: Self-pay | Admitting: Radiology

## 2024-01-20 DIAGNOSIS — I739 Peripheral vascular disease, unspecified: Secondary | ICD-10-CM

## 2024-01-20 MED ORDER — IOPAMIDOL (ISOVUE-370) INJECTION 76%
100.0000 mL | Freq: Once | INTRAVENOUS | Status: AC | PRN
  Administered 2024-01-20: 100 mL via INTRAVENOUS

## 2024-01-27 ENCOUNTER — Ambulatory Visit: Attending: Surgery | Admitting: Surgery

## 2024-01-27 ENCOUNTER — Ambulatory Visit (HOSPITAL_COMMUNITY)
Admission: RE | Admit: 2024-01-27 | Discharge: 2024-01-27 | Disposition: A | Discharge status: Home / Self Care | Source: Ambulatory Visit | Attending: Surgery | Admitting: Surgery

## 2024-01-27 ENCOUNTER — Encounter: Payer: Self-pay | Admitting: Surgery

## 2024-01-27 VITALS — BP 204/91 | HR 51 | Temp 97.7°F | Ht 73.0 in | Wt 249.8 lb

## 2024-01-27 DIAGNOSIS — I739 Peripheral vascular disease, unspecified: Secondary | ICD-10-CM

## 2024-01-27 LAB — VAS US ABI WITH/WO TBI

## 2024-01-27 NOTE — Progress Notes (Signed)
 Blood pressure (!) 204/91, pulse (!) 51, temperature 97.7 F (36.5 C), height 6' 1 (1.854 m), weight 249 lb 12.8 oz (113.3 kg), SpO2 96%.                                    [facility]  Patient name: Dean Mayer MRN: [id number] DOB: 04-Nov-1940 Sex: male   REASON FOR VISIT:    Follow up  HISOTRY OF PRESENT ILLNESS:    Dean Mayer is a 83 y.o. male who we have seen in the past for leg swelling and cellulitis.  The cellulitis resolved.  It was felt that he had adequate circulation to address all of this and that he just needed leg elevation and compression.  He has been complaining of knee pain which has resolved with orthopedic intervention.  Last summer he had a CT scan that showed aortic inflammation.  He was supposed to have a follow-up CT scan however this just happened and he is back to discuss these results   PAST MEDICAL HISTORY:   Past Medical History:  Diagnosis Date   Eye abnormalities    GERD (gastroesophageal reflux disease)    Gout 05/30/2023   Hypertension    Kidney stones    Orthostatic hypotension 05/30/2023   Prostate cancer (HCC)    Septic arthritis of knee (HCC) 04/10/2023   Throat cancer (HCC)      FAMILY HISTORY:   Family History  Problem Relation Age of Onset   Stroke Mother    Heart attack Mother    Heart disease Brother     SOCIAL HISTORY:   Social History   Tobacco Use   Smoking status: Former    Current packs/day: 20.00    Average packs/day: 13.7 packs/day for 60.5 years (829.5 ttl pk-yrs)    Types: Cigarettes    Start date: 1965    Passive exposure: Past   Smokeless tobacco: Never  Substance Use Topics   Alcohol use: No     ALLERGIES:   No Known Allergies   CURRENT MEDICATIONS:   Current Outpatient Medications  Medication Sig Dispense Refill   acetaminophen  (TYLENOL ) 500 MG tablet Take 1,000 mg by mouth every 12 (twelve) hours as needed for mild pain or moderate pain.      allopurinol  (ZYLOPRIM ) 100 MG tablet Take 1 tablet (100 mg total) by mouth daily. 30 tablet 0   ascorbic acid  (VITAMIN C ) 500 MG tablet Take 1,000 mg by mouth daily. (Patient not taking: Reported on 01/27/2024)     atorvastatin  (LIPITOR) 40 MG tablet Take 40 mg by mouth at bedtime.     cyanocobalamin  1000 MCG tablet Take 1 tablet (1,000 mcg total) by mouth daily.     docusate sodium  (COLACE) 100 MG capsule Take 1 capsule (100 mg total) by mouth 2 (two) times daily. 100 capsule 0   ferrous sulfate  325 (65 FE) MG tablet Take 325 mg by mouth daily with breakfast. (Patient not taking: Reported on 10/02/2023)     fluticasone  (FLONASE ) 50 MCG/ACT nasal spray Place 2 sprays into both nostrils daily as needed for allergies.     lactose free nutrition (BOOST) LIQD Take 240 mLs by mouth 2 (two) times daily.     levothyroxine  (SYNTHROID ) 112 MCG tablet Take 112 mcg by mouth daily before breakfast.     lisinopril  (ZESTRIL ) 5 MG tablet Take 5 mg by mouth See admin  instructions. Take 5 mg by mouth at 8 AM and 8 PM- hold for a Systolic number less than 110 or Diastolic number less than 60 (Patient not taking: Reported on 06/19/2023)     losartan  (COZAAR ) 25 MG tablet Take 1 tablet (25 mg total) by mouth 2 (two) times daily. at bedtime 90 tablet 3   methocarbamol  (ROBAXIN ) 500 MG tablet Take 1 tablet (500 mg total) by mouth every 8 (eight) hours as needed for muscle spasms. 20 tablet 0   Multiple Vitamins-Minerals (THERA-M MULTIPLE VITAMINS PO) Take 1 tablet by mouth daily with breakfast.     Omega-3 Fatty Acids (FISH OIL) 1200 MG CAPS Take 1,200 mg by mouth daily.     pantoprazole  (PROTONIX ) 40 MG tablet Take 40 mg by mouth daily before breakfast.     polyethylene glycol powder (GLYCOLAX /MIRALAX ) 17 GM/SCOOP powder Mix as directed and take 1 capful (17 g) by mouth daily as needed for mild constipation. (Patient taking differently: Take 17 g by mouth as needed.) 238 g 0   senna-docusate (SENOKOT-S) 8.6-50 MG tablet  Take 1 tablet by mouth at bedtime as needed for mild constipation. 30 tablet 0   sertraline (ZOLOFT) 50 MG tablet Take 50 mg by mouth daily.     tamsulosin  (FLOMAX ) 0.4 MG CAPS capsule Take 0.4 mg by mouth 2 (two) times daily.     No current facility-administered medications for this visit.    REVIEW OF SYSTEMS:   [X]  denotes positive finding, [ ]  denotes negative finding Cardiac  Comments:  Chest pain or chest pressure:    Shortness of breath upon exertion:    Short of breath when lying flat:    Irregular heart rhythm:        Vascular    Pain in calf, thigh, or hip brought on by ambulation:    Pain in feet at night that wakes you up from your sleep:     Blood clot in your veins:    Leg swelling:         Pulmonary    Oxygen at home:    Productive cough:     Wheezing:         Neurologic    Sudden weakness in arms or legs:     Sudden numbness in arms or legs:     Sudden onset of difficulty speaking or slurred speech:    Temporary loss of vision in one eye:     Problems with dizziness:         Gastrointestinal    Blood in stool:     Vomited blood:         Genitourinary    Burning when urinating:     Blood in urine:        Psychiatric    Major depression:         Hematologic    Bleeding problems:    Problems with blood clotting too easily:        Skin    Rashes or ulcers:        Constitutional    Fever or chills:      PHYSICAL EXAM:   Vitals:   01/27/24 1125  BP: (!) 204/91  Pulse: (!) 51  Temp: 97.7 F (36.5 C)  SpO2: 96%  Weight: 249 lb 12.8 oz (113.3 kg)  Height: 6' 1 (1.854 m)    GENERAL: The patient is a well-nourished male, in no acute distress. The vital signs are documented above. CARDIAC: There is a  regular rate and rhythm.  VASCULAR: 2+ pitting edema bilaterally PULMONARY: Non-labored respirations ABDOMEN: Soft and non-tender  MUSCULOSKELETAL: There are no major deformities or cyanosis. NEUROLOGIC: No focal weakness or paresthesias are  detected. SKIN: There are no ulcers or rashes noted. PSYCHIATRIC: The patient has a normal affect.  STUDIES:   I have reviewed his CT scan with the following findings: 1. Left kidney cysts. 2. Prostate brachytherapy changes. 3. Aortic atherosclerosis (ICD10-I70.0). 4. No evidence of aneurysm or dissection of the abdominal aorta or its branches and no significant occlusions. MEDICAL ISSUES:   Leg swelling: Patient is encouraged to keep his legs elevated when possible and to wear compression stockings while standing but not at night  Aortic inflammation: Based off his recent CT scan, this has resolved    Malvina New, IV, MD, FACS [facility] [phone] Pager [phone]

## 2024-02-10 ENCOUNTER — Other Ambulatory Visit: Payer: Self-pay

## 2024-02-10 DIAGNOSIS — M1712 Unilateral primary osteoarthritis, left knee: Secondary | ICD-10-CM

## 2024-02-12 ENCOUNTER — Ambulatory Visit: Admitting: Orthopedic Surgery

## 2024-02-12 DIAGNOSIS — M1712 Unilateral primary osteoarthritis, left knee: Secondary | ICD-10-CM | POA: Diagnosis not present

## 2024-02-12 NOTE — Progress Notes (Signed)
   Procedure Note  Patient: Dean Mayer             Date of Birth: [birth year]           MRN: [id number]             Visit Date: 02/12/2024  Procedures: Visit Diagnoses:  1. Arthritis of left knee     Large Joint Inj: L knee on 02/15/2024 3:37 PM Indications: diagnostic evaluation, joint swelling and pain Details: 18 G 1.5 in needle, superolateral approach  Arthrogram: No  Medications: 5 mL lidocaine  1 %; 60 mg Sodium Hyaluronate 60 MG/3ML Outcome: tolerated well, no immediate complications Procedure, treatment alternatives, risks and benefits explained, specific risks discussed. Consent was given by the patient. Immediately prior to procedure a time out was called to verify the correct patient, procedure, equipment, support staff and site/side marked as required. Patient was prepped and draped in the usual sterile fashion.     Lot #[id number]

## 2024-02-15 DIAGNOSIS — M1712 Unilateral primary osteoarthritis, left knee: Secondary | ICD-10-CM | POA: Diagnosis not present

## 2024-02-15 MED ORDER — LIDOCAINE HCL 1 % IJ SOLN
5.0000 mL | INTRAMUSCULAR | Status: AC | PRN
Start: 2024-02-15 — End: 2024-02-15
  Administered 2024-02-15: 5 mL

## 2024-02-15 MED ORDER — SODIUM HYALURONATE 60 MG/3ML IX PRSY
60.0000 mg | PREFILLED_SYRINGE | INTRA_ARTICULAR | Status: AC | PRN
Start: 2024-02-15 — End: 2024-02-15
  Administered 2024-02-15: 60 mg via INTRA_ARTICULAR

## 2024-05-20 ENCOUNTER — Ambulatory Visit: Admitting: Orthopedic Surgery

## 2024-05-20 DIAGNOSIS — M1712 Unilateral primary osteoarthritis, left knee: Secondary | ICD-10-CM

## 2024-05-20 DIAGNOSIS — G8929 Other chronic pain: Secondary | ICD-10-CM

## 2024-05-20 DIAGNOSIS — M25562 Pain in left knee: Secondary | ICD-10-CM

## 2024-05-21 ENCOUNTER — Encounter: Payer: Self-pay | Admitting: Orthopedic Surgery

## 2024-05-21 MED ORDER — BUPIVACAINE HCL 0.25 % IJ SOLN
4.0000 mL | INTRAMUSCULAR | Status: AC | PRN
  Administered 2024-05-20: 4 mL via INTRA_ARTICULAR

## 2024-05-21 MED ORDER — LIDOCAINE HCL 1 % IJ SOLN
5.0000 mL | INTRAMUSCULAR | Status: AC | PRN
  Administered 2024-05-20: 5 mL

## 2024-05-21 MED ORDER — TRIAMCINOLONE ACETONIDE 40 MG/ML IJ SUSP
40.0000 mg | INTRAMUSCULAR | Status: AC | PRN
  Administered 2024-05-20: 40 mg via INTRA_ARTICULAR

## 2024-05-21 NOTE — Progress Notes (Signed)
 Office Visit Note   Patient: Dean Mayer           Date of Birth: [birth year]           MRN: [id number] Visit Date: 05/20/2024 Requested by: Armand Katheryn Garrison, MD [address] [place],  [place] [address] PCP: Armand Katheryn Garrison, MD  Subjective: Chief Complaint  Patient presents with   Left Knee - Follow-up    HPI: Dean Mayer is a 83 y.o. male who presents to the office reporting left knee pain.  Last injection was 02/15/2024.  That was a gel shot.  Does have a history of sepsis in the left knee.  Uses a walker around the house.  Having a fair amount of debilitating pain in that left knee but not enough to undergo further intervention such as knee replacement..                ROS: All systems reviewed are negative as they relate to the chief complaint within the history of present illness.  Patient denies fevers or chills.  Assessment & Plan: Visit Diagnoses:  1. Arthritis of left knee   2. Chronic pain of left knee     Plan: Impression is left knee arthritis.  Patient has a history of prior sepsis in the knee.  Asked about total knee replacement today.  I think he would be at higher risk for complications including infection with total knee replacement.  After long discussion about the risk and benefits and possible adverse outcomes with infection of the knee replacement he wants to continue to do injections.  Injection performed today.  We will get him preapproved for gel shot as well in the near future approximately 3. This patient is diagnosed with osteoarthritis of the knee(s).    Radiographs show evidence of joint space narrowing, osteophytes, subchondral sclerosis and/or subchondral cysts.  This patient has knee pain which interferes with functional and activities of daily living.    This patient has experienced inadequate response, adverse effects and/or intolerance with conservative treatments such as acetaminophen , NSAIDS, topical creams, physical therapy or  regular exercise, knee bracing and/or weight loss.   This patient has experienced inadequate response or has a contraindication to intra articular steroid injections for at least 3 months.   This patient is not scheduled to have a total knee replacement within 6 months of starting treatment with viscosupplementation.   Follow-Up Instructions: No follow-ups on file.   Orders:  Orders Placed This Encounter  Procedures   Ambulatory request for injection medication   No orders of the defined types were placed in this encounter.     Procedures: Large Joint Inj: L knee on 05/20/2024 7:14 AM Indications: diagnostic evaluation, joint swelling and pain Details: 18 G 1.5 in needle, superolateral approach  Arthrogram: No  Medications: 5 mL lidocaine  1 %; 4 mL bupivacaine  0.25 %; 40 mg triamcinolone  acetonide 40 MG/ML Outcome: tolerated well, no immediate complications Procedure, treatment alternatives, risks and benefits explained, specific risks discussed. Consent was given by the patient. Immediately prior to procedure a time out was called to verify the correct patient, procedure, equipment, support staff and site/side marked as required. Patient was prepped and draped in the usual sterile fashion.       Clinical Data: No additional findings.  Objective: Vital Signs: There were no vitals taken for this visit.  Physical Exam:  Constitutional: Patient appears well-developed HEENT:  Head: Normocephalic Eyes:EOM are normal Neck: Normal range of motion Cardiovascular:  Normal rate Pulmonary/chest: Effort normal Neurologic: Patient is alert Skin: Skin is warm Psychiatric: Patient has normal mood and affect  Ortho Exam: Ortho exam demonstrates antalgic gait to the left.  2+ pitting edema bilateral lower extremities.  No calf tenderness negative Homans.  Trace effusion in that left knee.  Range of motion is about 5-1 05 with slight valgus alignment.  Extensor mechanism intact.   Negative patellar apprehension  Specialty Comments:  No specialty comments available.  Imaging: No results found.   PMFS History: Patient Active Problem List   Diagnosis Date Noted   Orthostatic hypotension 05/30/2023   Gout 05/30/2023   Protein-calorie malnutrition, severe 04/19/2023   Left displaced femoral neck fracture (HCC) 04/17/2023   Septic arthritis of knee (HCC) 04/10/2023   Painful knee 03/21/2023   MSSA bacteremia 03/18/2023   Acute gout of right ankle 03/18/2023   Bacterial infection of knee joint (HCC) 03/15/2023   Swelling of right foot 02/20/2023   Sedimentation rate elevation 02/20/2023   Positive ANA (antinuclear antibody) 02/20/2023   Peripheral arterial disease 12/04/2022   Cellulitis of right lower extremity 12/03/2022   Peripheral edema 12/02/2022   Cellulitis of right leg 12/01/2022   Hypertension 12/01/2022   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   Dyspnea on exertion 12/01/2022   Hypothyroidism 12/01/2022   Carotid stenosis 08/29/2021   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   Malignant neoplasm of prostate (HCC) 05/28/2018   Primary open angle glaucoma (POAG) of right eye, severe stage 09/16/2017   Lacunar infarct, acute (HCC) 01/05/2016   Confusion 01/04/2016   Hypertensive urgency 01/04/2016   Left-sided weakness 01/04/2016   Stroke (HCC) 01/04/2016   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   Basal cell carcinoma of left eyelid 05/09/2015   Carcinoma of left eyelid 04/25/2015   Acne rosacea 07/26/2014   Eyelid lesion 07/26/2014   Primary open angle glaucoma of both eyes 02/23/2013   Visual field defect 02/23/2013   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   Past Medical History:  Diagnosis Date   Eye abnormalities    GERD (gastroesophageal reflux disease)    Gout 05/30/2023   Hypertension    Kidney stones    Orthostatic hypotension 05/30/2023   Prostate cancer  (HCC)    Septic arthritis of knee (HCC) 04/10/2023   Throat cancer (HCC)     Family History  Problem Relation Age of Onset   Stroke Mother    Heart attack Mother    Heart disease Brother     Past Surgical History:  Procedure Laterality Date   CAROTID STENT     EYE SURGERY     I & D EXTREMITY Left 03/15/2023   Procedure: IRRIGATION AND DEBRIDEMENT LEFT KNEE;  Surgeon: Kendal Franky SQUIBB, MD;  Location: [facility] OR;  Service: Orthopedics;  Laterality: Left;   TONSILLECTOMY     TOTAL HIP ARTHROPLASTY Left 04/19/2023   Procedure: TOTAL HIP ARTHROPLASTY ANTERIOR APPROACH;  Surgeon: Fidel Rogue, MD;  Location: [facility] OR;  Service: Orthopedics;  Laterality: Left;   Social History   Occupational History   Not on file  Tobacco Use   Smoking status: Former    Current packs/day: 20.00    Average packs/day: 13.7 packs/day for 60.8 years (835.8 ttl pk-yrs)    Types: Cigarettes    Start date: 1965    Passive exposure: Past   Smokeless tobacco: Never  Vaping Use   Vaping status: Never Used  Substance and Sexual Activity   Alcohol use: No   Drug use: No   Sexual activity: Not on file

## 2024-06-08 ENCOUNTER — Encounter: Payer: Self-pay | Admitting: Radiology

## 2024-08-19 ENCOUNTER — Ambulatory Visit: Admitting: Orthopedic Surgery

## 2024-08-19 ENCOUNTER — Other Ambulatory Visit: Payer: Self-pay

## 2024-08-19 ENCOUNTER — Encounter: Payer: Self-pay | Admitting: Orthopedic Surgery

## 2024-08-19 DIAGNOSIS — M1712 Unilateral primary osteoarthritis, left knee: Secondary | ICD-10-CM

## 2024-08-19 NOTE — Progress Notes (Signed)
" ° °  Procedure Note  Patient: Dean Mayer             Date of Birth: [birth year]           MRN: [id number]             Visit Date: 08/19/2024  Procedures: Visit Diagnoses:  1. Arthritis of left knee     Large Joint Inj: L knee on 08/19/2024 8:50 PM Indications: diagnostic evaluation, joint swelling and pain Details: 18 G 1.5 in needle, superolateral approach  Arthrogram: No  Medications: 5 mL lidocaine  1 %; 60 mg Sodium Hyaluronate 60 MG/3ML Outcome: tolerated well, no immediate complications Procedure, treatment alternatives, risks and benefits explained, specific risks discussed. Consent was given by the patient. Immediately prior to procedure a time out was called to verify the correct patient, procedure, equipment, support staff and site/side marked as required. Patient was prepped and draped in the usual sterile fashion.     Lot #[id number]. Cortisone on 05/20/2024 helped for only 6 weeks.  Does report continued difficulty with ambulation.  He does have valgus alignment with no effusion.   "

## 2024-08-23 MED ORDER — SODIUM HYALURONATE 60 MG/3ML IX PRSY
60.0000 mg | PREFILLED_SYRINGE | INTRA_ARTICULAR | Status: AC | PRN
  Administered 2024-08-19: 60 mg via INTRA_ARTICULAR

## 2024-08-23 MED ORDER — LIDOCAINE HCL 1 % IJ SOLN
5.0000 mL | INTRAMUSCULAR | Status: AC | PRN
  Administered 2024-08-19: 5 mL
